# Patient Record
Sex: Male | Born: 1955 | Race: White | Hispanic: No | Marital: Married | Smoking: Former smoker
Health system: Southern US, Community
[De-identification: ages and names within clinical notes are randomized; demographics above are authoritative.]

## PROBLEM LIST (undated history)

## (undated) DIAGNOSIS — I4891 Unspecified atrial fibrillation: Secondary | ICD-10-CM

## (undated) DIAGNOSIS — I509 Heart failure, unspecified: Secondary | ICD-10-CM

## (undated) DIAGNOSIS — S88919A Complete traumatic amputation of unspecified lower leg, level unspecified, initial encounter: Secondary | ICD-10-CM

## (undated) DIAGNOSIS — N189 Chronic kidney disease, unspecified: Secondary | ICD-10-CM

## (undated) DIAGNOSIS — I10 Essential (primary) hypertension: Secondary | ICD-10-CM

## (undated) HISTORY — DX: Essential (primary) hypertension: I10

## (undated) HISTORY — DX: Chronic kidney disease, unspecified: N18.9

## (undated) HISTORY — DX: Heart failure, unspecified: I50.9

## (undated) HISTORY — DX: Unspecified atrial fibrillation: I48.91

---

## 2010-06-27 ENCOUNTER — Inpatient Hospital Stay (HOSPITAL_COMMUNITY)
Admission: EM | Admit: 2010-06-27 | Discharge: 2010-07-15 | Disposition: A | Discharge status: Rehabilitation Facility | Payer: Self-pay | Source: Home / Self Care | Admitting: Emergency Medicine

## 2010-06-28 ENCOUNTER — Ambulatory Visit: Payer: Self-pay | Admitting: Cardiovascular Disease

## 2010-06-28 ENCOUNTER — Encounter (INDEPENDENT_AMBULATORY_CARE_PROVIDER_SITE_OTHER): Payer: Self-pay | Admitting: Internal Medicine

## 2010-06-29 ENCOUNTER — Encounter (INDEPENDENT_AMBULATORY_CARE_PROVIDER_SITE_OTHER): Payer: Self-pay | Admitting: Internal Medicine

## 2010-06-29 HISTORY — PX: BELOW KNEE LEG AMPUTATION: SUR23

## 2010-07-06 ENCOUNTER — Ambulatory Visit: Payer: Self-pay | Admitting: Infectious Disease

## 2010-07-08 ENCOUNTER — Encounter: Payer: Self-pay | Admitting: Internal Medicine

## 2010-07-11 ENCOUNTER — Encounter: Payer: Self-pay | Admitting: Internal Medicine

## 2010-07-15 ENCOUNTER — Inpatient Hospital Stay (HOSPITAL_COMMUNITY)
Admission: RE | Admit: 2010-07-15 | Discharge: 2010-07-29 | Payer: Self-pay | Admitting: Physical Medicine & Rehabilitation

## 2010-07-16 ENCOUNTER — Ambulatory Visit: Payer: Self-pay | Admitting: Physical Medicine & Rehabilitation

## 2010-08-05 ENCOUNTER — Telehealth: Payer: Self-pay | Admitting: Internal Medicine

## 2010-08-09 DIAGNOSIS — I4891 Unspecified atrial fibrillation: Secondary | ICD-10-CM

## 2010-08-09 DIAGNOSIS — I10 Essential (primary) hypertension: Secondary | ICD-10-CM | POA: Insufficient documentation

## 2010-08-09 DIAGNOSIS — I480 Paroxysmal atrial fibrillation: Secondary | ICD-10-CM | POA: Insufficient documentation

## 2010-08-12 ENCOUNTER — Encounter: Payer: Self-pay | Admitting: Internal Medicine

## 2010-08-12 ENCOUNTER — Ambulatory Visit: Payer: Self-pay | Admitting: Internal Medicine

## 2010-08-12 DIAGNOSIS — I428 Other cardiomyopathies: Secondary | ICD-10-CM | POA: Insufficient documentation

## 2010-08-12 DIAGNOSIS — S88119A Complete traumatic amputation at level between knee and ankle, unspecified lower leg, initial encounter: Secondary | ICD-10-CM | POA: Insufficient documentation

## 2010-08-12 DIAGNOSIS — I5022 Chronic systolic (congestive) heart failure: Secondary | ICD-10-CM | POA: Insufficient documentation

## 2010-08-12 DIAGNOSIS — E119 Type 2 diabetes mellitus without complications: Secondary | ICD-10-CM | POA: Insufficient documentation

## 2010-08-12 DIAGNOSIS — N183 Chronic kidney disease, stage 3 (moderate): Secondary | ICD-10-CM

## 2010-08-12 DIAGNOSIS — N1831 Chronic kidney disease, stage 3a: Secondary | ICD-10-CM | POA: Insufficient documentation

## 2010-08-19 ENCOUNTER — Ambulatory Visit: Payer: Self-pay | Admitting: Internal Medicine

## 2010-09-01 ENCOUNTER — Ambulatory Visit (HOSPITAL_COMMUNITY)
Admission: RE | Admit: 2010-09-01 | Discharge: 2010-09-01 | Payer: Self-pay | Source: Home / Self Care | Attending: Orthopedic Surgery | Admitting: Orthopedic Surgery

## 2010-09-01 LAB — CBC
HCT: 43.6 % (ref 39.0–52.0)
Hemoglobin: 14.1 g/dL (ref 13.0–17.0)
MCH: 29.7 pg (ref 26.0–34.0)
MCHC: 32.3 g/dL (ref 30.0–36.0)
MCV: 91.8 fL (ref 78.0–100.0)
Platelets: 426 10*3/uL — ABNORMAL HIGH (ref 150–400)
RBC: 4.75 MIL/uL (ref 4.22–5.81)
RDW: 16 % — ABNORMAL HIGH (ref 11.5–15.5)
WBC: 8.8 10*3/uL (ref 4.0–10.5)

## 2010-09-01 LAB — COMPREHENSIVE METABOLIC PANEL
ALT: 16 U/L (ref 0–53)
AST: 22 U/L (ref 0–37)
Albumin: 3.4 g/dL — ABNORMAL LOW (ref 3.5–5.2)
Alkaline Phosphatase: 69 U/L (ref 39–117)
BUN: 48 mg/dL — ABNORMAL HIGH (ref 6–23)
CO2: 24 mEq/L (ref 19–32)
Calcium: 9.5 mg/dL (ref 8.4–10.5)
Chloride: 103 mEq/L (ref 96–112)
Creatinine, Ser: 2.14 mg/dL — ABNORMAL HIGH (ref 0.4–1.5)
GFR calc Af Amer: 39 mL/min — ABNORMAL LOW (ref >60)
GFR calc non Af Amer: 32 mL/min — ABNORMAL LOW (ref >60)
Glucose, Bld: 140 mg/dL — ABNORMAL HIGH (ref 70–99)
Potassium: 4.9 mEq/L (ref 3.5–5.1)
Sodium: 136 mEq/L (ref 135–145)
Total Bilirubin: 0.5 mg/dL (ref 0.3–1.2)
Total Protein: 8.3 g/dL (ref 6.0–8.3)

## 2010-09-01 LAB — PROTIME-INR
INR: 1.12 (ref 0.00–1.49)
Prothrombin Time: 14.6 seconds (ref 11.6–15.2)

## 2010-09-01 LAB — GLUCOSE, CAPILLARY: Glucose-Capillary: 128 mg/dL — ABNORMAL HIGH (ref 70–99)

## 2010-09-01 LAB — APTT: aPTT: 29 seconds (ref 24–37)

## 2010-09-04 LAB — CONVERTED CEMR LAB
ALT: 12 units/L (ref 0–53)
AST: 16 units/L (ref 0–37)
Albumin: 3.1 g/dL — ABNORMAL LOW (ref 3.5–5.2)
Alkaline Phosphatase: 78 units/L (ref 39–117)
BUN: 32 mg/dL — ABNORMAL HIGH (ref 6–23)
Bilirubin, Direct: 0.2 mg/dL (ref 0.0–0.3)
CO2: 29 meq/L (ref 19–32)
Calcium: 8.9 mg/dL (ref 8.4–10.5)
Chloride: 95 meq/L — ABNORMAL LOW (ref 96–112)
Creatinine, Ser: 2 mg/dL — ABNORMAL HIGH (ref 0.4–1.5)
GFR calc non Af Amer: 36.16 mL/min — ABNORMAL LOW (ref >60.00)
Glucose, Bld: 85 mg/dL (ref 70–99)
Potassium: 4.1 meq/L (ref 3.5–5.1)
Sodium: 132 meq/L — ABNORMAL LOW (ref 135–145)
TSH: 1.95 microintl units/mL (ref 0.35–5.50)
Total Bilirubin: 0.7 mg/dL (ref 0.3–1.2)
Total Protein: 7.6 g/dL (ref 6.0–8.3)

## 2010-09-09 ENCOUNTER — Telehealth (INDEPENDENT_AMBULATORY_CARE_PROVIDER_SITE_OTHER): Payer: Self-pay | Admitting: *Deleted

## 2010-09-12 ENCOUNTER — Encounter (HOSPITAL_COMMUNITY)
Admission: RE | Admit: 2010-09-12 | Discharge: 2010-09-27 | Payer: Self-pay | Source: Home / Self Care | Attending: Internal Medicine | Admitting: Internal Medicine

## 2010-09-12 ENCOUNTER — Ambulatory Visit: Admission: RE | Admit: 2010-09-12 | Discharge: 2010-09-12 | Payer: Self-pay | Source: Home / Self Care

## 2010-09-12 ENCOUNTER — Encounter: Payer: Self-pay | Admitting: Internal Medicine

## 2010-09-19 ENCOUNTER — Encounter: Payer: Self-pay | Admitting: Internal Medicine

## 2010-09-19 ENCOUNTER — Other Ambulatory Visit: Payer: Self-pay | Admitting: Internal Medicine

## 2010-09-19 ENCOUNTER — Ambulatory Visit
Admission: RE | Admit: 2010-09-19 | Discharge: 2010-09-19 | Payer: Self-pay | Source: Home / Self Care | Attending: Cardiology | Admitting: Cardiology

## 2010-09-19 ENCOUNTER — Ambulatory Visit
Admission: RE | Admit: 2010-09-19 | Discharge: 2010-09-19 | Payer: Self-pay | Source: Home / Self Care | Attending: Internal Medicine | Admitting: Internal Medicine

## 2010-09-19 LAB — BRAIN NATRIURETIC PEPTIDE: Pro B Natriuretic peptide (BNP): 203.1 pg/mL — ABNORMAL HIGH (ref 0.0–100.0)

## 2010-09-19 LAB — TSH: TSH: 1.37 u[IU]/mL (ref 0.35–5.50)

## 2010-09-19 LAB — BASIC METABOLIC PANEL
BUN: 34 mg/dL — ABNORMAL HIGH (ref 6–23)
CO2: 26 mEq/L (ref 19–32)
Calcium: 9.6 mg/dL (ref 8.4–10.5)
Chloride: 96 mEq/L (ref 96–112)
Creatinine, Ser: 1.7 mg/dL — ABNORMAL HIGH (ref 0.4–1.5)
GFR: 46.17 mL/min — ABNORMAL LOW (ref >60.00)
Glucose, Bld: 119 mg/dL — ABNORMAL HIGH (ref 70–99)
Potassium: 4 mEq/L (ref 3.5–5.1)
Sodium: 133 mEq/L — ABNORMAL LOW (ref 135–145)

## 2010-09-19 LAB — CBC WITH DIFFERENTIAL/PLATELET
Basophils Absolute: 0.1 10*3/uL (ref 0.0–0.1)
Basophils Relative: 0.5 % (ref 0.0–3.0)
Eosinophils Absolute: 1 10*3/uL — ABNORMAL HIGH (ref 0.0–0.7)
Eosinophils Relative: 8 % — ABNORMAL HIGH (ref 0.0–5.0)
HCT: 43 % (ref 39.0–52.0)
Hemoglobin: 14.1 g/dL (ref 13.0–17.0)
Lymphocytes Relative: 17.1 % (ref 12.0–46.0)
Lymphs Abs: 2.1 10*3/uL (ref 0.7–4.0)
MCHC: 32.9 g/dL (ref 30.0–36.0)
MCV: 92.1 fl (ref 78.0–100.0)
Monocytes Absolute: 0.9 10*3/uL (ref 0.1–1.0)
Monocytes Relative: 7.9 % (ref 3.0–12.0)
Neutro Abs: 8 10*3/uL — ABNORMAL HIGH (ref 1.4–7.7)
Neutrophils Relative %: 66.5 % (ref 43.0–77.0)
Platelets: 335 10*3/uL (ref 150.0–400.0)
RBC: 4.67 Mil/uL (ref 4.22–5.81)
RDW: 17.5 % — ABNORMAL HIGH (ref 11.5–14.6)
WBC: 12 10*3/uL — ABNORMAL HIGH (ref 4.5–10.5)

## 2010-09-19 LAB — CONVERTED CEMR LAB: POC INR: 1.1

## 2010-09-19 LAB — HEPATIC FUNCTION PANEL
ALT: 16 U/L (ref 0–53)
AST: 22 U/L (ref 0–37)
Albumin: 3.6 g/dL (ref 3.5–5.2)
Alkaline Phosphatase: 62 U/L (ref 39–117)
Bilirubin, Direct: 0.1 mg/dL (ref 0.0–0.3)
Total Bilirubin: 0.6 mg/dL (ref 0.3–1.2)
Total Protein: 8 g/dL (ref 6.0–8.3)

## 2010-09-19 LAB — T4, FREE: Free T4: 1.48 ng/dL (ref 0.60–1.60)

## 2010-09-22 LAB — SURGICAL PCR SCREEN
MRSA, PCR: NEGATIVE
Staphylococcus aureus: POSITIVE — AB

## 2010-09-26 ENCOUNTER — Ambulatory Visit: Admission: RE | Admit: 2010-09-26 | Discharge: 2010-09-26 | Payer: Self-pay | Source: Home / Self Care

## 2010-09-26 LAB — CONVERTED CEMR LAB: POC INR: 1.2

## 2010-09-27 NOTE — Progress Notes (Signed)
Summary: talk to nurse  Phone Note Call from Patient Call back at Home Phone [phone] Call back at [phone]   Caller: Patient Reason for Call: Talk to Nurse, Referral Summary of Call: pt called wanting to schedule an appt to see Dr Gala Romney. Per his d/c summary states to f/u with Bensim[facility] in 2 weeks. I dont see anything that states for him to f/u with Bensim[facility] only the coumadin clinic.  Initial call taken by: Edman Circle,  August 05, 2010 1:06 PM  Follow-up for Phone Call        Brian Martin calls today to schedule post hospital follow-up appt.  Dr. Gala Romney is aware and appt. scheduled for 08/12/10 at 9:15am.  Pt confirmed appt. Mylo Red RN

## 2010-09-29 NOTE — Progress Notes (Signed)
Summary: Nuclear Pre-Procedure  Phone Note Outgoing Call Call back at [facility] Phone [phone]   Call placed by: Stanton Kidney, EMT-P,  September 09, 2010 1:08 PM Call placed to: Patient Action Taken: Phone Call Completed Summary of Call: Reviewed information on Myoview Information Sheet (see scanned document for further details).  Spoke with the patient. Stanton Kidney, EMT-P  September 09, 2010 1:08 PM     Nuclear Med Background Indications for Stress Test: Evaluation for Ischemia, Post Hospital  Indications Comments: 07/28/10 (L) BKA > A. Fib  History: Echo  History Comments: 11/11 Echo: EF= 25-30%  Symptoms: SOB    Nuclear Pre-Procedure Cardiac Risk Factors: History of Smoking, Hypertension, IDDM Type 2 Height (in): 73

## 2010-09-29 NOTE — Op Note (Signed)
  NAMEREEF, ACHTERBERG               ACCOUNT NO.:  [id number]  MEDICAL RECORD NO.:  [id number]          PATIENT TYPE:  AMB  LOCATION:  SDS                          FACILITY:  [facility]  PHYSICIAN:  Nadara Mustard, MD     DATE OF BIRTH:  07/03/1956  DATE OF PROCEDURE:  09/01/2010 DATE OF DISCHARGE:  09/01/2010                              OPERATIVE REPORT   PREOPERATIVE DIAGNOSIS:  Gangrene, left transtibial amputation.  POSTOPERATIVE DIAGNOSIS:  Gangrene, left transtibial amputation.  PROCEDURE:  Revision left transtibial amputation.  SURGEON:  Nadara Mustard, MD  ANESTHESIA:  General.  ESTIMATED BLOOD LOSS:  Minimal.  ANTIBIOTICS:  1 gram of Kefzol.  DRAINS:  None.  COMPLICATIONS:  None.  TOURNIQUET TIME:  None.  DISPOSITION:  To PACU in stable condition.  INDICATIONS FOR PROCEDURE:  The patient is a 55 year old gentleman, diabetic, insensate neuropathy with peripheral vascular disease status post a left transtibial amputation who presents at this time for revision amputation due to foul-smelling gangrenous changes of the transtibial amputation.  Risks and benefits were discussed including persistent infection, neurovascular injury, nonhealing of the wound, need for higher-level amputation, risk of DVT, and risk of pulmonary embolus.  The patient states he understands and wished to proceed at this time.  DESCRIPTION OF PROCEDURE:  The patient was brought to OR room 1 and underwent a general anesthetic.  After adequate level of anesthesia was obtained, the patient's left lower extremity was prepped using DuraPrep, draped into a sterile field, and the necrotic ulcers were draped out of the sterile field with an impervious Ioban drape.  A V-type incision was made around the necrotic tissue.  This was carried down to bone.  The bone was resected 4 cm proximal to the previous bone cuts of both the tibia and fibula.  This left enough bone length for a  below-the-knee prosthesis.  The vascular bundles were suture ligated x2 each. Hemostasis was obtained.  The tissue was contractile but did not have a perfect color or consistency.  There was no necrosis, no abscess, but there was some mild ischemic changes to the soft tissue, however, it did have good contractility.  The skin was then closed using a far-near-near- far suture technique with 2-0 nylon.  This closed well without tension on the skin.  The wound was covered with Adaptic orthopedic sponges, ABD dressing, Kerlix, and Coban.  The patient was extubated and taken to PACU in stable condition. Plan for discharge to home.  Start dressing changes in 5 days with Ace compressive wrap.  Plan to follow up in the office in 4 weeks.     Nadara Mustard, MD     MVD/MEDQ  D:  09/01/2010  T:  09/02/2010  Job:  [id number]  Electronically Signed by Aldean Baker MD on 09/29/2010 07:32:38 AM

## 2010-09-29 NOTE — Consult Note (Signed)
Summary: [facility]  [facility]   Imported By: Marylou Mccoy 08/18/2010 16:44:44  _____________________________________________________________________  External Attachment:    Type:   Image     Comment:   External Document

## 2010-09-29 NOTE — Assessment & Plan Note (Signed)
Summary: appt 915a/a-fib/mt   CC:  no complaints.  History of Present Illness: Primary Cardiologist:  Dr. Arvilla Meres  Park Beck is a 55 year old male admitted in early November with a left foot ulcer complicated by osteomyelitis.  He subsequently underwent left below-the-knee amputation.  Cardiology was involved when it was noted that the patient had atrial fibrillation.  He was also noted to be in acute systolic heart failure.  He has a dilated cardiomyopathy with an EF of 25-30% and moderate mitral regurgitation on echocardiogram.  Transesophageal echocardiogram during his hospitalization demonstrated no vegetation with an EF of 35%.  He developed acute renal failure secondary to acute tubular necrosis in the setting of sepsis.  He had a prolonged hospitalization culminating in rehabilitation.  He was discharged from rehabilitation December 2.  He presents for followup.  He is doing well.  He is staying at his son's house; his son is with him today.  He denies chest pain.  He denies significant changes in dyspnea.  He is working with rehab without significant problems.  No syncope.  No orthopnea or PND.  His unna boot is off the right leg.  His ulcer is healing.  He has continued edema in this leg.  No fevers or chills.  He is off antibiotics.  Current Medications (verified): 1)  Amiodarone Hcl 200 Mg Tabs (Amiodarone Hcl) .... Take One Tablet By Mouth Twice A Day 2)  Aspirin 81 Mg Tbec (Aspirin) .... Take One Tablet By Mouth Daily 3)  Carvedilol 25 Mg Tabs (Carvedilol) .... Take One Tablet By Mouth Twice A Day 4)  Hydralazine Hcl 50 Mg Tabs (Hydralazine Hcl) .... Take One Tablet By Mouth Three Times A Day 5)  Lantus 100 Unit/ml Soln (Insulin Glargine) .Marland Kitchen.. 10 Units At Bedtime 6)  Warfarin Sodium 2 Mg Tabs (Warfarin Sodium) .... As Directed 7)  Isosorbide Mononitrate Cr 30 Mg Xr24h-Tab (Isosorbide Mononitrate) .... Take One Tablet By Mouth Daily 8)  Fentanyl 25 Mcg/hr Pt72 (Fentanyl)  .... Every 3 Days 9)  Hydrocodone-Acetaminophen 5-500 Mg Tabs (Hydrocodone-Acetaminophen) .Marland Kitchen.. 1-2 Every 4 Hours As Needed 10)  Methocarbamol 500 Mg Tabs (Methocarbamol) .... One Every 6 Hours As Needed  Past History:  Past Medical History: Persistent Atrial Fibrillation   a.  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  Chronic renal failure.   Past Surgical History: s/p left BKA  Review of Systems       As per HPI and past medical history; otherwise all systems negative.   Vital Signs:  Patient profile:   55 year old male Height:      73 inches Weight:      250 pounds BMI:     33.10 Pulse rate:   64 / minute Resp:     14 per minute BP sitting:   160 / 80  (left arm)  Vitals Entered By: Kem Parkinson (August 12, 2010 9:36 AM)  Physical Exam  General:  Chronically ill appearing, well developed, in no acute distress HEENT: normal Neck: JVP 9  Cardiac:  normal S1, S2; RRR; no murmur Lungs:  clear to auscultation bilaterally, no wheezing, rhonchi or rales Abd: soft, nontender, no hepatomegaly Ext: 2-3+ right leg edema to thigh Skin: warm and dry; healing ulcer lat right leg with eschar Neuro:  CNs 2-12 intact, no focal abnormalities noted    Impression & Recommendations:  Problem # 1:  ATRIAL FIBRILLATION (ICD-427.31)  Maintaining NSR on Amio. Remains on coumadin due to stroke risk. Decrease Amio to 200 mg once daily. Check LFTs and TSH.  Problem # 2:  CARDIOMYOPATHY, DILATED (ICD-425.4) Good medical regimen. No ACE due to CKD. Suspect tachy mediated CM.   Will arrange myoview to assess for ischemia and reassess EF.  Increase hydralazine to 75 three times a day and imdue 30 two times a day  Problem # 3:  CHRONIC SYSTOLIC HEART FAILURE (ICD-428.22) As above. Increased edema in leg.  Add lasix 40 mg once daily. Check bmet in one week.  Problem # 4:   CHRONIC KIDNEY DISEASE STAGE III (MODERATE) (ICD-585.3) No ACE-I for now due to renal insuff. Titrate hydralazine and Imdur.  Problem # 5:  HYPERTENSION, UNSPECIFIED (ICD-401.9) BP up. Titrating meds.  Other Orders: Nuclear Stress Test (Nuc Stress Test)  Patient Instructions: 1)  Your physician recommends that you schedule a follow-up appointment in: 1 month 2)  Your physician recommends that you return for lab work next Friday--BMET,TSH,liver function test--428.22: 3)  Your physician has recommended you make the following change in your medication: Decrease amiodarone to 200 mg by mouth daily. Increase hydralazine to 75 mg by mouth three times a day.Increase isosorbide mononitrate to 60 mg by mouth daily. Start furosemide 40 mg by mouth daily. 4)  Your physician has requested that you have an adenosine myoview.  For further information please visit [email or web address]  Please follow instruction sheet, as given. Prescriptions: ISOSORBIDE MONONITRATE CR 60 MG XR24H-TAB (ISOSORBIDE MONONITRATE) Take one tablet by mouth daily  #30 x 3   Entered by:   Dossie Arbour, RN, BSN   Authorized by:   Dolores Patty, MD, [facility]   Signed by:   Dossie Arbour, RN, BSN on 08/12/2010   Method used:   Electronically to        [facility] [address] (retail)       [phone]       [place], [place]  [address]       Ph: [phone]       Fax: [phone]   RxID:   [id number] FUROSEMIDE 40 MG TABS (FUROSEMIDE) Take one tablet by mouth daily.  #30 x 3   Entered by:   Dossie Arbour, RN, BSN   Authorized by:   Dolores Patty, MD, [facility]   Signed by:   Dossie Arbour, RN, BSN on 08/12/2010   Method used:   Electronically to        [facility] [address] (retail)       [phone]       [place], [place]  [address]       Ph: [phone]       Fax: [phone]   RxID:   [id number] HYDRALAZINE HCL 25 MG  TABS (HYDRALAZINE HCL) Take 3  tablets by mouth three times a day  #270 x 3   Entered by:   Dossie Arbour, RN, BSN   Authorized by:   Dolores Patty, MD, [facility]   Signed by:   Dossie Arbour, RN, BSN on 08/12/2010   Method used:   Electronically to        [facility] [address] (retail)       [phone]       [place], [place]  [address]  Ph: [phone]       Fax: [phone]   RxID:   [id number] AMIODARONE HCL 200 MG TABS (AMIODARONE HCL) Take one tablet by mouth daily  #30 x 6   Entered by:   Dossie Arbour, RN, BSN   Authorized by:   Dolores Patty, MD, [facility]   Signed by:   Dossie Arbour, RN, BSN on 08/12/2010   Method used:   Electronically to        [facility] [address] (retail)       [phone]       [place], [place]  [address]       Ph: [phone]       Fax: [phone]   RxID:   [id number]

## 2010-09-29 NOTE — Medication Information (Signed)
Summary: new/sp  Anticoagulant Therapy  Managed by: Weston Brass, PharmD Referring MD: D.Bensimhon Supervising MD: Daleen Squibb MD, Maisie Fus Indication 1: Atrial Fibrillation Lab Used: [facility] [place] Site: [address] INR POC 1.1 INR RANGE 2.0-3.0  Dietary changes: no    Health status changes: no    Bleeding/hemorrhagic complications: no    Recent/future hospitalizations: yes       Details: Pt was started on Coumadin during hospitalization in December.  Per pt and his son, HH was checking INRs for a few weeks after discharge but I am not sure who was monitoring Coumadin.  They state pt has not had Coumadin checked since that time and no one ever told them to change the dose  Any changes in medication regimen? no    Recent/future dental: no  Any missed doses?: no       Is patient compliant with meds? yes      Comments: Pt and caregiver educated on bleeding risks, dietary concerns and medication interactions.  States he has been taking 1/2 of 3mg  tablet since d/c from hospital.   Anticoagulation Management History:      The patient comes in today for his initial visit for anticoagulation therapy.  Positive risk factors for bleeding include presence of serious comorbidities.  Negative risk factors for bleeding include an age less than 64 years old.  The bleeding index is 'intermediate risk'.  Positive CHADS2 values include History of CHF, History of HTN, and History of Diabetes.  Negative CHADS2 values include Age > 31 years old.  Anticoagulation responsible provider: Daleen Squibb MD, Maisie Fus.  INR POC: 1.1.    Anticoagulation Management Assessment/Plan:      The patient's current anticoagulation dose is Warfarin sodium 2 mg tabs: as directed.  The target INR is 2.0-3.0.  The next INR is due 09/26/2010.  Anticoagulation instructions were given to patient.  Results were reviewed/authorized by Weston Brass, PharmD.  He was notified by Weston Brass PharmD.         Current Anticoagulation  Instructions: INR 1.1  Increase dose to 3mg  daily.  Recheck INR in 1 week.

## 2010-09-29 NOTE — Assessment & Plan Note (Addendum)
Summary: Cardiology Nuclear Testing  Nuclear Med Background Indications for Stress Test: Evaluation for Ischemia, Post Hospital  Indications Comments: 07/28/10 (L) BKA > A. Fib  History: Echo  History Comments: 11/11 Echo: EF= 25-30%  Symptoms: Palpitations, SOB    Nuclear Pre-Procedure Cardiac Risk Factors: History of Smoking, Hypertension, IDDM Type 2 Caffeine/Decaff Intake: none NPO After: 6:00 PM Lungs: clear IV 0.9% NS with Angio Cath: 22g     IV Site: R Wrist IV Started by: Doyne Keel, CNMT Chest Size (in) 44     Height (in): 73 Weight (lb): 184 BMI: 24.36 Tech Comments: Carvedilol held x 14hrs.  Nuclear Med Study 1 or 2 day study:  1 day     Stress Test Type:  Eugenie Birks Reading MD:  Arvilla Meres, MD     Referring MD:  D.Bensimhon Resting Radionuclide:  Technetium 8m Tetrofosmin     Resting Radionuclide Dose:  10.5 mCi  Stress Radionuclide:  Technetium 74m Tetrofosmin     Stress Radionuclide Dose:  33 mCi   Stress Protocol  Max Systolic BP: 151 mm Hg Lexiscan: 0.4 mg   Stress Test Technologist:  Milana Na, EMT-P     Nuclear Technologist:  Doyne Keel, CNMT  Rest Procedure  Myocardial perfusion imaging was performed at rest 45 minutes following the intravenous administration of Technetium 71m Tetrofosmin.  Stress Procedure  The patient received IV Lexiscan 0.4 mg over 15-seconds.  Technetium 51m Tetrofosmin injected at 30-seconds.  There were no significant changes with infusion.  Quantitative spect images were obtained after a 45 minute delay.  QPS Raw Data Images:  Normal; no motion artifact; normal heart/lung ratio. Stress Images:  Mildly decreased uptake in the inferior wall and infero-apex. Rest Images:  Mildly decreased uptake in the inferior wall and infero-apex. Subtraction (SDS):  Mildly decreased uptake in the inferior wall and infero-apex. Suspect soft tissue attenuation but cannot exclude previous infarct. No ischemia. Transient Ischemic  Dilatation:  0.94  (Normal <1.22)  Lung/Heart Ratio:  0.29  (Normal <0.45)  Quantitative Gated Spect Images QGS EDV:  144 ml QGS ESV:  81 ml QGS EF:  43 % QGS cine images:  Septal dyskinesis  Findings Low risk nuclear study      Overall Impression  Exercise Capacity: Lexiscan with no exercise. ECG Impression: No significant ST segment change with Lexiscan. Overall Impression: Low risk stress nuclear study. Overall Impression Comments: Mildly decreased uptake in the inferior wall and infero-apex. Suspect soft tissue attenuation but cannot exclude previous infarct. No ischemia. EF 43%  Appended Document: Cardiology Nuclear Testing pt aware at [facility]

## 2010-10-03 ENCOUNTER — Encounter: Payer: Self-pay | Admitting: Cardiology

## 2010-10-03 ENCOUNTER — Encounter (INDEPENDENT_AMBULATORY_CARE_PROVIDER_SITE_OTHER): Payer: Medicare Other

## 2010-10-03 DIAGNOSIS — I4891 Unspecified atrial fibrillation: Secondary | ICD-10-CM

## 2010-10-03 DIAGNOSIS — Z7901 Long term (current) use of anticoagulants: Secondary | ICD-10-CM

## 2010-10-03 LAB — CONVERTED CEMR LAB: POC INR: 2

## 2010-10-05 NOTE — Medication Information (Signed)
Summary: rov/sp  Anticoagulant Therapy  Managed by: Bethena Midget, RN, BSN Referring MD: D.Bensimhon Supervising MD: Patty Sermons Indication 1: Atrial Fibrillation Lab Used: [facility] [place] Site: [address] INR POC 1.2 INR RANGE 2.0-3.0  Dietary changes: no    Health status changes: no    Bleeding/hemorrhagic complications: no    Recent/future hospitalizations: no    Any changes in medication regimen? no    Recent/future dental: no  Any missed doses?: no       Is patient compliant with meds? yes      Comments: Pt has 2mg s tablet prior to last visit he was taking 1/2 pill at last visit dose increased to 1 pill everyday thus 2mg s daily.   Anticoagulation Management History:      The patient is taking warfarin and comes in today for a routine follow up visit.  Positive risk factors for bleeding include presence of serious comorbidities.  Negative risk factors for bleeding include an age less than 56 years old.  The bleeding index is 'intermediate risk'.  Positive CHADS2 values include History of CHF, History of HTN, and History of Diabetes.  Negative CHADS2 values include Age > 37 years old.  Anticoagulation responsible provider: Kaian Fahs.  INR POC: 1.2.  Cuvette Lot#: [id number].  Exp: 08/2011.    Anticoagulation Management Assessment/Plan:      The patient's current anticoagulation dose is Warfarin sodium 2 mg tabs: as directed.  The target INR is 2.0-3.0.  The next INR is due 10/03/2010.  Anticoagulation instructions were given to patient.  Results were reviewed/authorized by Bethena Midget, RN, BSN.  He was notified by Bethena Midget, RN, BSN.         Prior Anticoagulation Instructions: INR 1.1  Increase dose to 3mg  daily.  Recheck INR in 1 week.   Current Anticoagulation Instructions: INR 1.2 Change dose to 1.5 pills everyday. Recheck in one week.  Sample given 2mg  # 10, Lot # [id number] Exp 12/2010

## 2010-10-05 NOTE — Assessment & Plan Note (Signed)
Summary: 1 MONTH ROV/SL   Visit Type:  Follow-up  CC:  no cardiac complaints.  History of Present Illness: Primary Cardiologist:  Dr. Arvilla Meres  Brian Martin is a 55 year old male admitted in early November with a left foot ulcer complicated by osteomyelitis.  He subsequently underwent left below-the-knee amputation.  Cardiology was involved when it was noted that the patient had atrial fibrillation.  He was also noted to be in acute systolic heart failure.  He has a dilated cardiomyopathy with an EF of 25-30% and moderate mitral regurgitation on echocardiogram.  Transesophageal echocardiogram during his hospitalization demonstrated no vegetation with an EF of 35%.  He developed acute renal failure secondary to acute tubular necrosis in the setting of sepsis with a Cr of 9.0  He had a prolonged hospitalization culminating in rehabilitation.  He was discharged from rehabilitation December 2.  He presents for followup.  He is doing well.  He is staying at his son's house; his son is with him today.  He denies chest pain.  He denies significant changes in dyspnea.  He is working with rehab without significant problems.  No syncope.  No orthopnea or PND. No CP/dyspnea.   Had nuke study which showed EF 43%.  Mildly decreased uptake in the inferior wall and infero-apex. Suspect soft tissue attenuation but cannot exclude previous infarct. No ischemia. Last Cr 2.0  He is taking medications as prescribed excepthe ran out of carvedilol earlier this week. No bleeding with coumadin but has not had it followed up in at least a month.   Problems Prior to Update: 1)  Long-term (CURRENT) Use of Other Medications  (ICD-V58.69) 2)  Bka, Left Leg  (ICD-V49.75) 3)  Diabetes Mellitus, Type II  (ICD-250.00) 4)  Chronic Kidney Disease Stage Iii (MODERATE)  (ICD-585.3) 5)  Chronic Systolic Heart Failure  (ICD-428.22) 6)  Cardiomyopathy, Dilated  (ICD-425.4) 7)  Hypertension, Unspecified  (ICD-401.9) 8)  Atrial  Fibrillation  (ICD-427.31)  Medications Prior to Update: 1)  Amiodarone Hcl 200 Mg Tabs (Amiodarone Hcl) .... Take One Tablet By Mouth Daily 2)  Aspirin 81 Mg Tbec (Aspirin) .... Take One Tablet By Mouth Daily 3)  Carvedilol 25 Mg Tabs (Carvedilol) .... Take One Tablet By Mouth Twice A Day 4)  Hydralazine Hcl 25 Mg Tabs (Hydralazine Hcl) .... Take 3  Tablets By Mouth Three Times A Day 5)  Lantus 100 Unit/ml Soln (Insulin Glargine) .Marland Kitchen.. 10 Units At Bedtime 6)  Warfarin Sodium 2 Mg Tabs (Warfarin Sodium) .... As Directed 7)  Isosorbide Mononitrate Cr 60 Mg Xr24h-Tab (Isosorbide Mononitrate) .... Take One Tablet By Mouth Daily 8)  Fentanyl 25 Mcg/hr Pt72 (Fentanyl) .... Every 3 Days 9)  Hydrocodone-Acetaminophen 5-500 Mg Tabs (Hydrocodone-Acetaminophen) .Marland Kitchen.. 1-2 Every 4 Hours As Needed 10)  Methocarbamol 500 Mg Tabs (Methocarbamol) .... One Every 6 Hours As Needed 11)  Furosemide 40 Mg Tabs (Furosemide) .... Take One Tablet By Mouth Daily.  Current Medications (verified): 1)  Amiodarone Hcl 200 Mg Tabs (Amiodarone Hcl) .... Take One Tablet By Mouth Daily 2)  Aspirin 81 Mg Tbec (Aspirin) .... Take One Tablet By Mouth Daily 3)  Carvedilol 25 Mg Tabs (Carvedilol) .... Take One Tablet By Mouth Twice A Day 4)  Hydralazine Hcl 25 Mg Tabs (Hydralazine Hcl) .... Take 3  Tablets By Mouth Three Times A Day 5)  Lantus 100 Unit/ml Soln (Insulin Glargine) .Marland Kitchen.. 10 Units At Bedtime 6)  Warfarin Sodium 2 Mg Tabs (Warfarin Sodium) .... As Directed 7)  Isosorbide Mononitrate Cr  60 Mg Xr24h-Tab (Isosorbide Mononitrate) .... Take One Tablet By Mouth Daily 8)  Hydrocodone-Acetaminophen 5-500 Mg Tabs (Hydrocodone-Acetaminophen) .Marland Kitchen.. 1-2 Every 4 Hours As Needed 9)  Methocarbamol 500 Mg Tabs (Methocarbamol) .... One Every 6 Hours As Needed 10)  Furosemide 40 Mg Tabs (Furosemide) .... Take One Tablet By Mouth Daily.  Allergies (verified): No Known Drug Allergies  Past History:  Past Medical History: Atrial  Fibrillation   a.  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  Chronic renal failure.   Review of Systems       As per HPI and past medical history; otherwise all systems negative.   Vital Signs:  Patient profile:   55 year old male Height:      73 inches Weight:      194 pounds Pulse rate:   68 / minute Pulse rhythm:   regular BP sitting:   138 / 94  (right arm)  Vitals Entered By: Jacquelin Hawking, CMA (September 19, 2010 9:50 AM)  Physical Exam  General:  Chronically ill appearing, well developed, in no acute distress HEENT: normal Neck: no JVD Cardiac:  regular. normal S1, S2; RRR; no murmur Lungs:  clear to auscultation bilaterally, no wheezing, rhonchi or rales Abd: soft, nontender, no hepatomegaly Ext: 2-3+ right leg edema to thigh Skin: warm and dry; healing ulcer lat right leg with eschar Neuro:  CNs 2-12 intact, no focal abnormalities noted    Impression & Recommendations:  Problem # 1:  CHRONIC SYSTOLIC HEART FAILURE (ICD-428.22) LV function improving. No significant ischemia on Myoview (suspect probable NICM despite his CAD risk factors). No ACE-I for now due to renal insuff. Continue carvedilol and hydralazine. May be able to wean lasix eventually. Repeat echo in 2 months. Check labs today.  Problem # 2:  ATRIAL FIBRILLATION (ICD-427.31) Back in SR. Will continue amiodarone for now. If maintaining SR in 2 months will consider d/c amio. Continue coumadin.   Other Orders: EKG w/ Interpretation (93000) EKG w/ Interpretation (93000) TLB-BMP (Basic Metabolic Panel-BMET) (80048-METABOL) TLB-CBC Platelet - w/Differential (85025-CBCD) TLB-Hepatic/Liver Function Pnl (80076-HEPATIC) TLB-BNP (B-Natriuretic Peptide) (83880-BNPR) TLB-TSH (Thyroid Stimulating Hormone) (84443-TSH) TLB-T4 (Thyrox), Free [phone]) Echocardiogram (Echo)  Patient Instructions: 1)   Your physician recommends that you schedule a follow-up appointment in: 2 months with Dr Gala Romney 2)  Your physician recommends that you have lab work today 3)  Your physician recommends that you continue on your current medications as directed. Please refer to the Current Medication list given to you today. 4)  Your physician has requested that you have an echocardiogram in 2 months with your follow up visit.  Echocardiography is a painless test that uses sound waves to create images of your heart. It provides your doctor with information about the size and shape of your heart and how well your heart's chambers and valves are working.  This procedure takes approximately one hour. There are no restrictions for this procedure. Prescriptions: LANTUS 100 UNIT/ML SOLN (INSULIN GLARGINE) 10 units at bedtime  #1 x 0   Entered by:   Charolotte Capuchin, RN   Authorized by:   Dolores Patty, MD, [facility]   Signed by:   Charolotte Capuchin, RN on 09/19/2010   Method used:   Electronically to        [facility] [address] (retail)       [phone]       [facility]  [place], [place]  [address]       Ph: [phone]       Fax: [phone]   RxID:   [id number] CARVEDILOL 25 MG TABS (CARVEDILOL) Take one tablet by mouth twice a day  #60 x 11   Entered by:   Charolotte Capuchin, RN   Authorized by:   Dolores Patty, MD, [facility]   Signed by:   Charolotte Capuchin, RN on 09/19/2010   Method used:   Electronically to        [facility] [address] (retail)       [phone]       [place], [place]  [address]       Ph: [phone]       Fax: [phone]   RxID:   [id number]

## 2010-10-13 NOTE — Medication Information (Signed)
Summary: Coumadin Clinic  Anticoagulant Therapy  Managed by: Windell Hummingbird, RN Referring MD: D.Bensimhon Supervising MD: Myrtis Ser MD, Tinnie Gens Indication 1: Atrial Fibrillation Lab Used: [facility] [place] Site: [address] INR POC 2.0 INR RANGE 2.0-3.0  Dietary changes: no    Health status changes: no    Bleeding/hemorrhagic complications: no    Recent/future hospitalizations: no    Any changes in medication regimen? no    Recent/future dental: no  Any missed doses?: no       Is patient compliant with meds? yes       Allergies: No Known Drug Allergies  Anticoagulation Management History:      Positive risk factors for bleeding include presence of serious comorbidities.  Negative risk factors for bleeding include an age less than 29 years old.  The bleeding index is 'intermediate risk'.  Positive CHADS2 values include History of CHF, History of HTN, and History of Diabetes.  Negative CHADS2 values include Age > 29 years old.  Anticoagulation responsible provider: Myrtis Ser MD, Tinnie Gens.  INR POC: 2.0.  Cuvette Lot#: [id number].  Exp: 09/2011.    Anticoagulation Management Assessment/Plan:      The patient's current anticoagulation dose is Warfarin sodium 2 mg tabs: as directed.  The target INR is 2.0-3.0.  The next INR is due 10/17/2010.  Anticoagulation instructions were given to patient.  Results were reviewed/authorized by Windell Hummingbird, RN.  He was notified by Windell Hummingbird, RN.         Prior Anticoagulation Instructions: INR 1.2 Change dose to 1.5 pills everyday. Recheck in one week.  Sample given 2mg  # 10, Lot # [id number] Exp 12/2010   Current Anticoagulation Instructions: INR 2.0 Continue taking 1.5 tablets every day. Recheck 2 weeks.

## 2010-10-17 ENCOUNTER — Encounter: Payer: Self-pay | Admitting: Internal Medicine

## 2010-10-17 ENCOUNTER — Encounter (INDEPENDENT_AMBULATORY_CARE_PROVIDER_SITE_OTHER): Payer: Medicare Other

## 2010-10-17 DIAGNOSIS — I4891 Unspecified atrial fibrillation: Secondary | ICD-10-CM

## 2010-10-17 DIAGNOSIS — Z7901 Long term (current) use of anticoagulants: Secondary | ICD-10-CM

## 2010-10-17 LAB — CONVERTED CEMR LAB: POC INR: 1.9

## 2010-10-25 NOTE — Medication Information (Signed)
Summary: rov/ewj  Anticoagulant Therapy  Managed by: Weston Brass, PharmD Referring MD: D.Bensimhon Supervising MD: Clifton James MD, Cristal Deer Indication 1: Atrial Fibrillation Lab Used: [facility] [place] Site: [address] INR POC 1.9 INR RANGE 2.0-3.0  Dietary changes: no    Health status changes: no    Bleeding/hemorrhagic complications: no    Recent/future hospitalizations: no    Any changes in medication regimen? no    Recent/future dental: no  Any missed doses?: no       Is patient compliant with meds? yes      Comments: Patient reports being on 3mg  sample tablets instead of 2 mg tablets.  He was still taking the same overal dose of 3mg  daily as directed.  He was given more 3mg  sample tablets today(#16 exp [id number])  Allergies: No Known Drug Allergies  Anticoagulation Management History:      The patient is taking warfarin and comes in today for a routine follow up visit.  Positive risk factors for bleeding include presence of serious comorbidities.  Negative risk factors for bleeding include an age less than 67 years old.  The bleeding index is 'intermediate risk'.  Positive CHADS2 values include History of CHF, History of HTN, and History of Diabetes.  Negative CHADS2 values include Age > 2 years old.  Anticoagulation responsible provider: Clifton James MD, Cristal Deer.  INR POC: 1.9.  Cuvette Lot#: [id number].  Exp: 09/2011.    Anticoagulation Management Assessment/Plan:      The patient's current anticoagulation dose is Warfarin sodium 2 mg tabs: as directed.  The target INR is 2.0-3.0.  The next INR is due 10/31/2010.  Anticoagulation instructions were given to patient.  Results were reviewed/authorized by Weston Brass, PharmD.  He was notified by Margot Chimes PharmD Candidate.         Prior Anticoagulation Instructions: INR 2.0 Continue taking 1.5 tablets every day. Recheck 2 weeks.  Current Anticoagulation Instructions: INR 1.9  Take 1  1/2 tablets today and cut back on the greens a little.  Then resume your normal dose of 1 tablet (3mg ) everyday.  Recheck INR in 2 weeks.

## 2010-10-28 ENCOUNTER — Encounter: Payer: Self-pay | Admitting: Internal Medicine

## 2010-10-28 DIAGNOSIS — I4891 Unspecified atrial fibrillation: Secondary | ICD-10-CM

## 2010-10-31 ENCOUNTER — Encounter (INDEPENDENT_AMBULATORY_CARE_PROVIDER_SITE_OTHER): Payer: Medicaid Other

## 2010-10-31 ENCOUNTER — Encounter: Payer: Self-pay | Admitting: Internal Medicine

## 2010-10-31 DIAGNOSIS — Z7901 Long term (current) use of anticoagulants: Secondary | ICD-10-CM

## 2010-10-31 DIAGNOSIS — I4891 Unspecified atrial fibrillation: Secondary | ICD-10-CM

## 2010-11-04 ENCOUNTER — Other Ambulatory Visit (HOSPITAL_COMMUNITY): Payer: Self-pay

## 2010-11-04 ENCOUNTER — Ambulatory Visit: Payer: Medicare Other | Admitting: Internal Medicine

## 2010-11-04 ENCOUNTER — Ambulatory Visit: Payer: Self-pay | Admitting: Internal Medicine

## 2010-11-04 ENCOUNTER — Other Ambulatory Visit (HOSPITAL_COMMUNITY): Payer: Medicare Other

## 2010-11-08 LAB — RENAL FUNCTION PANEL
Albumin: 1.7 g/dL — ABNORMAL LOW (ref 3.5–5.2)
Albumin: 1.8 g/dL — ABNORMAL LOW (ref 3.5–5.2)
Albumin: 1.8 g/dL — ABNORMAL LOW (ref 3.5–5.2)
Albumin: 2.3 g/dL — ABNORMAL LOW (ref 3.5–5.2)
BUN: 37 mg/dL — ABNORMAL HIGH (ref 6–23)
BUN: 43 mg/dL — ABNORMAL HIGH (ref 6–23)
BUN: 46 mg/dL — ABNORMAL HIGH (ref 6–23)
BUN: 63 mg/dL — ABNORMAL HIGH (ref 6–23)
BUN: 66 mg/dL — ABNORMAL HIGH (ref 6–23)
BUN: 71 mg/dL — ABNORMAL HIGH (ref 6–23)
BUN: 73 mg/dL — ABNORMAL HIGH (ref 6–23)
CO2: 24 mEq/L (ref 19–32)
CO2: 24 mEq/L (ref 19–32)
CO2: 25 mEq/L (ref 19–32)
CO2: 25 mEq/L (ref 19–32)
CO2: 25 mEq/L (ref 19–32)
CO2: 26 mEq/L (ref 19–32)
CO2: 26 mEq/L (ref 19–32)
CO2: 26 mEq/L (ref 19–32)
CO2: 26 mEq/L (ref 19–32)
Calcium: 7.4 mg/dL — ABNORMAL LOW (ref 8.4–10.5)
Calcium: 7.5 mg/dL — ABNORMAL LOW (ref 8.4–10.5)
Calcium: 7.7 mg/dL — ABNORMAL LOW (ref 8.4–10.5)
Calcium: 7.8 mg/dL — ABNORMAL LOW (ref 8.4–10.5)
Calcium: 7.9 mg/dL — ABNORMAL LOW (ref 8.4–10.5)
Calcium: 8.4 mg/dL (ref 8.4–10.5)
Calcium: 8.4 mg/dL (ref 8.4–10.5)
Chloride: 100 mEq/L (ref 96–112)
Chloride: 100 mEq/L (ref 96–112)
Chloride: 101 mEq/L (ref 96–112)
Chloride: 104 mEq/L (ref 96–112)
Chloride: 92 mEq/L — ABNORMAL LOW (ref 96–112)
Chloride: 96 mEq/L (ref 96–112)
Creatinine, Ser: 2.93 mg/dL — ABNORMAL HIGH (ref 0.4–1.5)
Creatinine, Ser: 3.47 mg/dL — ABNORMAL HIGH (ref 0.4–1.5)
Creatinine, Ser: 4.42 mg/dL — ABNORMAL HIGH (ref 0.4–1.5)
Creatinine, Ser: 4.57 mg/dL — ABNORMAL HIGH (ref 0.4–1.5)
Creatinine, Ser: 4.96 mg/dL — ABNORMAL HIGH (ref 0.4–1.5)
Creatinine, Ser: 5.74 mg/dL — ABNORMAL HIGH (ref 0.4–1.5)
Creatinine, Ser: 8.66 mg/dL — ABNORMAL HIGH (ref 0.4–1.5)
GFR calc Af Amer: 15 mL/min — ABNORMAL LOW (ref >60)
GFR calc Af Amer: 17 mL/min — ABNORMAL LOW (ref >60)
GFR calc Af Amer: 19 mL/min — ABNORMAL LOW (ref >60)
GFR calc Af Amer: 27 mL/min — ABNORMAL LOW (ref >60)
GFR calc Af Amer: 8 mL/min — ABNORMAL LOW (ref >60)
GFR calc non Af Amer: 14 mL/min — ABNORMAL LOW (ref >60)
GFR calc non Af Amer: 16 mL/min — ABNORMAL LOW (ref >60)
GFR calc non Af Amer: 22 mL/min — ABNORMAL LOW (ref >60)
GFR calc non Af Amer: 6 mL/min — ABNORMAL LOW (ref >60)
Glucose, Bld: 107 mg/dL — ABNORMAL HIGH (ref 70–99)
Glucose, Bld: 112 mg/dL — ABNORMAL HIGH (ref 70–99)
Glucose, Bld: 134 mg/dL — ABNORMAL HIGH (ref 70–99)
Glucose, Bld: 157 mg/dL — ABNORMAL HIGH (ref 70–99)
Glucose, Bld: 73 mg/dL (ref 70–99)
Glucose, Bld: 78 mg/dL (ref 70–99)
Glucose, Bld: 99 mg/dL (ref 70–99)
Phosphorus: 4.7 mg/dL — ABNORMAL HIGH (ref 2.3–4.6)
Phosphorus: 7.6 mg/dL — ABNORMAL HIGH (ref 2.3–4.6)
Phosphorus: 8.1 mg/dL — ABNORMAL HIGH (ref 2.3–4.6)
Phosphorus: 8.3 mg/dL — ABNORMAL HIGH (ref 2.3–4.6)
Potassium: 3.7 mEq/L (ref 3.5–5.1)
Potassium: 3.8 mEq/L (ref 3.5–5.1)
Potassium: 3.8 mEq/L (ref 3.5–5.1)
Potassium: 3.9 mEq/L (ref 3.5–5.1)
Potassium: 3.9 mEq/L (ref 3.5–5.1)
Potassium: 4.1 mEq/L (ref 3.5–5.1)
Sodium: 130 mEq/L — ABNORMAL LOW (ref 135–145)
Sodium: 132 mEq/L — ABNORMAL LOW (ref 135–145)
Sodium: 135 mEq/L (ref 135–145)
Sodium: 135 mEq/L (ref 135–145)

## 2010-11-08 LAB — CBC
HCT: 21.3 % — ABNORMAL LOW (ref 39.0–52.0)
HCT: 25.3 % — ABNORMAL LOW (ref 39.0–52.0)
HCT: 26 % — ABNORMAL LOW (ref 39.0–52.0)
HCT: 26.4 % — ABNORMAL LOW (ref 39.0–52.0)
HCT: 26.8 % — ABNORMAL LOW (ref 39.0–52.0)
HCT: 27.6 % — ABNORMAL LOW (ref 39.0–52.0)
HCT: 28.7 % — ABNORMAL LOW (ref 39.0–52.0)
HCT: 28.9 % — ABNORMAL LOW (ref 39.0–52.0)
HCT: 33.1 % — ABNORMAL LOW (ref 39.0–52.0)
HCT: 35.9 % — ABNORMAL LOW (ref 39.0–52.0)
HCT: 36.1 % — ABNORMAL LOW (ref 39.0–52.0)
Hemoglobin: 8.7 g/dL — ABNORMAL LOW (ref 13.0–17.0)
Hemoglobin: 10.1 g/dL — ABNORMAL LOW (ref 13.0–17.0)
Hemoglobin: 11.3 g/dL — ABNORMAL LOW (ref 13.0–17.0)
Hemoglobin: 11.6 g/dL — ABNORMAL LOW (ref 13.0–17.0)
Hemoglobin: 7 g/dL — ABNORMAL LOW (ref 13.0–17.0)
Hemoglobin: 7.8 g/dL — ABNORMAL LOW (ref 13.0–17.0)
Hemoglobin: 7.8 g/dL — ABNORMAL LOW (ref 13.0–17.0)
Hemoglobin: 8.4 g/dL — ABNORMAL LOW (ref 13.0–17.0)
Hemoglobin: 9.3 g/dL — ABNORMAL LOW (ref 13.0–17.0)
Hemoglobin: 9.3 g/dL — ABNORMAL LOW (ref 13.0–17.0)
Hemoglobin: 9.5 g/dL — ABNORMAL LOW (ref 13.0–17.0)
MCH: 29.2 pg (ref 26.0–34.0)
MCH: 27.7 pg (ref 26.0–34.0)
MCH: 28 pg (ref 26.0–34.0)
MCH: 28 pg (ref 26.0–34.0)
MCH: 28.1 pg (ref 26.0–34.0)
MCH: 28.1 pg (ref 26.0–34.0)
MCH: 28.2 pg (ref 26.0–34.0)
MCH: 28.3 pg (ref 26.0–34.0)
MCH: 28.5 pg (ref 26.0–34.0)
MCH: 28.5 pg (ref 26.0–34.0)
MCH: 28.6 pg (ref 26.0–34.0)
MCH: 28.8 pg (ref 26.0–34.0)
MCH: 28.8 pg (ref 26.0–34.0)
MCHC: 30.6 g/dL (ref 30.0–36.0)
MCHC: 30.8 g/dL (ref 30.0–36.0)
MCHC: 31 g/dL (ref 30.0–36.0)
MCHC: 31.5 g/dL (ref 30.0–36.0)
MCHC: 32.1 g/dL (ref 30.0–36.0)
MCHC: 32.1 g/dL (ref 30.0–36.0)
MCHC: 32.3 g/dL (ref 30.0–36.0)
MCHC: 32.3 g/dL (ref 30.0–36.0)
MCHC: 32.4 g/dL (ref 30.0–36.0)
MCHC: 32.8 g/dL (ref 30.0–36.0)
MCHC: 33 g/dL (ref 30.0–36.0)
MCHC: 33.1 g/dL (ref 30.0–36.0)
MCHC: 33.2 g/dL (ref 30.0–36.0)
MCHC: 33.6 g/dL (ref 30.0–36.0)
MCV: 92.6 fL (ref 78.0–100.0)
MCV: 85.8 fL (ref 78.0–100.0)
MCV: 86.2 fL (ref 78.0–100.0)
MCV: 86.6 fL (ref 78.0–100.0)
MCV: 86.7 fL (ref 78.0–100.0)
MCV: 87.3 fL (ref 78.0–100.0)
MCV: 88.9 fL (ref 78.0–100.0)
MCV: 89 fL (ref 78.0–100.0)
MCV: 90.8 fL (ref 78.0–100.0)
MCV: 91.3 fL (ref 78.0–100.0)
MCV: 91.7 fL (ref 78.0–100.0)
MCV: 91.9 fL (ref 78.0–100.0)
Platelets: 161 10*3/uL (ref 150–400)
Platelets: 169 10*3/uL (ref 150–400)
Platelets: 180 10*3/uL (ref 150–400)
Platelets: 197 10*3/uL (ref 150–400)
Platelets: 221 10*3/uL (ref 150–400)
Platelets: 261 10*3/uL (ref 150–400)
Platelets: 285 10*3/uL (ref 150–400)
Platelets: 290 10*3/uL (ref 150–400)
Platelets: 296 10*3/uL (ref 150–400)
Platelets: 300 10*3/uL (ref 150–400)
Platelets: 321 10*3/uL (ref 150–400)
Platelets: 340 10*3/uL (ref 150–400)
Platelets: 345 10*3/uL (ref 150–400)
Platelets: 376 10*3/uL (ref 150–400)
RBC: 2.98 MIL/uL — ABNORMAL LOW (ref 4.22–5.81)
RBC: 2.53 MIL/uL — ABNORMAL LOW (ref 4.22–5.81)
RBC: 2.77 MIL/uL — ABNORMAL LOW (ref 4.22–5.81)
RBC: 2.84 MIL/uL — ABNORMAL LOW (ref 4.22–5.81)
RBC: 2.95 MIL/uL — ABNORMAL LOW (ref 4.22–5.81)
RBC: 2.97 MIL/uL — ABNORMAL LOW (ref 4.22–5.81)
RBC: 3.3 MIL/uL — ABNORMAL LOW (ref 4.22–5.81)
RBC: 3.33 MIL/uL — ABNORMAL LOW (ref 4.22–5.81)
RBC: 4.1 MIL/uL — ABNORMAL LOW (ref 4.22–5.81)
RDW: 16.1 % — ABNORMAL HIGH (ref 11.5–15.5)
RDW: 16.6 % — ABNORMAL HIGH (ref 11.5–15.5)
RDW: 16.6 % — ABNORMAL HIGH (ref 11.5–15.5)
RDW: 16.7 % — ABNORMAL HIGH (ref 11.5–15.5)
RDW: 16.8 % — ABNORMAL HIGH (ref 11.5–15.5)
RDW: 17 % — ABNORMAL HIGH (ref 11.5–15.5)
RDW: 17.4 % — ABNORMAL HIGH (ref 11.5–15.5)
RDW: 17.7 % — ABNORMAL HIGH (ref 11.5–15.5)
RDW: 18.2 % — ABNORMAL HIGH (ref 11.5–15.5)
RDW: 18.3 % — ABNORMAL HIGH (ref 11.5–15.5)
RDW: 18.4 % — ABNORMAL HIGH (ref 11.5–15.5)
RDW: 18.8 % — ABNORMAL HIGH (ref 11.5–15.5)
WBC: 10.5 10*3/uL (ref 4.0–10.5)
WBC: 11.6 10*3/uL — ABNORMAL HIGH (ref 4.0–10.5)
WBC: 14.4 10*3/uL — ABNORMAL HIGH (ref 4.0–10.5)
WBC: 14.8 10*3/uL — ABNORMAL HIGH (ref 4.0–10.5)
WBC: 15.5 10*3/uL — ABNORMAL HIGH (ref 4.0–10.5)
WBC: 15.8 10*3/uL — ABNORMAL HIGH (ref 4.0–10.5)
WBC: 16.8 10*3/uL — ABNORMAL HIGH (ref 4.0–10.5)
WBC: 18.9 10*3/uL — ABNORMAL HIGH (ref 4.0–10.5)
WBC: 8 10*3/uL (ref 4.0–10.5)
WBC: 8.3 10*3/uL (ref 4.0–10.5)
WBC: 9.7 10*3/uL (ref 4.0–10.5)

## 2010-11-08 LAB — URINE DRUGS OF ABUSE SCREEN W ALC, ROUTINE (REF LAB)
Barbiturate Quant, Ur: NEGATIVE
Benzodiazepines.: NEGATIVE
Creatinine,U: 16.3 mg/dL
Methadone: NEGATIVE
Phencyclidine (PCP): NEGATIVE
Propoxyphene: NEGATIVE

## 2010-11-08 LAB — URINE CULTURE: Special Requests: NEGATIVE

## 2010-11-08 LAB — CARBOXYHEMOGLOBIN
Carboxyhemoglobin: 1.6 % — ABNORMAL HIGH (ref 0.5–1.5)
Carboxyhemoglobin: 1.7 % — ABNORMAL HIGH (ref 0.5–1.5)
Carboxyhemoglobin: 1.7 % — ABNORMAL HIGH (ref 0.5–1.5)
Carboxyhemoglobin: 1.8 % — ABNORMAL HIGH (ref 0.5–1.5)
Carboxyhemoglobin: 1.9 % — ABNORMAL HIGH (ref 0.5–1.5)
Methemoglobin: 0.6 % (ref 0.0–1.5)
Methemoglobin: 0.7 % (ref 0.0–1.5)
Methemoglobin: 0.7 % (ref 0.0–1.5)
Methemoglobin: 1 % (ref 0.0–1.5)
O2 Saturation: 53.2 %
O2 Saturation: 56.3 %
O2 Saturation: 64.7 %
O2 Saturation: 73.1 %
O2 Saturation: 77.2 %
O2 Saturation: 97.8 %
Total hemoglobin: 10.3 g/dL — ABNORMAL LOW (ref 13.5–18.0)
Total hemoglobin: 11.6 g/dL — ABNORMAL LOW (ref 13.5–18.0)
Total hemoglobin: 9.2 g/dL — ABNORMAL LOW (ref 13.5–18.0)

## 2010-11-08 LAB — GLUCOSE, CAPILLARY
Glucose-Capillary: 112 mg/dL — ABNORMAL HIGH (ref 70–99)
Glucose-Capillary: 126 mg/dL — ABNORMAL HIGH (ref 70–99)
Glucose-Capillary: 71 mg/dL (ref 70–99)
Glucose-Capillary: 100 mg/dL — ABNORMAL HIGH (ref 70–99)
Glucose-Capillary: 102 mg/dL — ABNORMAL HIGH (ref 70–99)
Glucose-Capillary: 105 mg/dL — ABNORMAL HIGH (ref 70–99)
Glucose-Capillary: 105 mg/dL — ABNORMAL HIGH (ref 70–99)
Glucose-Capillary: 105 mg/dL — ABNORMAL HIGH (ref 70–99)
Glucose-Capillary: 109 mg/dL — ABNORMAL HIGH (ref 70–99)
Glucose-Capillary: 110 mg/dL — ABNORMAL HIGH (ref 70–99)
Glucose-Capillary: 110 mg/dL — ABNORMAL HIGH (ref 70–99)
Glucose-Capillary: 111 mg/dL — ABNORMAL HIGH (ref 70–99)
Glucose-Capillary: 111 mg/dL — ABNORMAL HIGH (ref 70–99)
Glucose-Capillary: 113 mg/dL — ABNORMAL HIGH (ref 70–99)
Glucose-Capillary: 113 mg/dL — ABNORMAL HIGH (ref 70–99)
Glucose-Capillary: 113 mg/dL — ABNORMAL HIGH (ref 70–99)
Glucose-Capillary: 114 mg/dL — ABNORMAL HIGH (ref 70–99)
Glucose-Capillary: 115 mg/dL — ABNORMAL HIGH (ref 70–99)
Glucose-Capillary: 115 mg/dL — ABNORMAL HIGH (ref 70–99)
Glucose-Capillary: 115 mg/dL — ABNORMAL HIGH (ref 70–99)
Glucose-Capillary: 115 mg/dL — ABNORMAL HIGH (ref 70–99)
Glucose-Capillary: 117 mg/dL — ABNORMAL HIGH (ref 70–99)
Glucose-Capillary: 120 mg/dL — ABNORMAL HIGH (ref 70–99)
Glucose-Capillary: 123 mg/dL — ABNORMAL HIGH (ref 70–99)
Glucose-Capillary: 124 mg/dL — ABNORMAL HIGH (ref 70–99)
Glucose-Capillary: 126 mg/dL — ABNORMAL HIGH (ref 70–99)
Glucose-Capillary: 128 mg/dL — ABNORMAL HIGH (ref 70–99)
Glucose-Capillary: 129 mg/dL — ABNORMAL HIGH (ref 70–99)
Glucose-Capillary: 130 mg/dL — ABNORMAL HIGH (ref 70–99)
Glucose-Capillary: 130 mg/dL — ABNORMAL HIGH (ref 70–99)
Glucose-Capillary: 131 mg/dL — ABNORMAL HIGH (ref 70–99)
Glucose-Capillary: 132 mg/dL — ABNORMAL HIGH (ref 70–99)
Glucose-Capillary: 132 mg/dL — ABNORMAL HIGH (ref 70–99)
Glucose-Capillary: 134 mg/dL — ABNORMAL HIGH (ref 70–99)
Glucose-Capillary: 136 mg/dL — ABNORMAL HIGH (ref 70–99)
Glucose-Capillary: 139 mg/dL — ABNORMAL HIGH (ref 70–99)
Glucose-Capillary: 142 mg/dL — ABNORMAL HIGH (ref 70–99)
Glucose-Capillary: 144 mg/dL — ABNORMAL HIGH (ref 70–99)
Glucose-Capillary: 145 mg/dL — ABNORMAL HIGH (ref 70–99)
Glucose-Capillary: 146 mg/dL — ABNORMAL HIGH (ref 70–99)
Glucose-Capillary: 148 mg/dL — ABNORMAL HIGH (ref 70–99)
Glucose-Capillary: 148 mg/dL — ABNORMAL HIGH (ref 70–99)
Glucose-Capillary: 149 mg/dL — ABNORMAL HIGH (ref 70–99)
Glucose-Capillary: 151 mg/dL — ABNORMAL HIGH (ref 70–99)
Glucose-Capillary: 153 mg/dL — ABNORMAL HIGH (ref 70–99)
Glucose-Capillary: 154 mg/dL — ABNORMAL HIGH (ref 70–99)
Glucose-Capillary: 158 mg/dL — ABNORMAL HIGH (ref 70–99)
Glucose-Capillary: 158 mg/dL — ABNORMAL HIGH (ref 70–99)
Glucose-Capillary: 158 mg/dL — ABNORMAL HIGH (ref 70–99)
Glucose-Capillary: 159 mg/dL — ABNORMAL HIGH (ref 70–99)
Glucose-Capillary: 159 mg/dL — ABNORMAL HIGH (ref 70–99)
Glucose-Capillary: 162 mg/dL — ABNORMAL HIGH (ref 70–99)
Glucose-Capillary: 168 mg/dL — ABNORMAL HIGH (ref 70–99)
Glucose-Capillary: 169 mg/dL — ABNORMAL HIGH (ref 70–99)
Glucose-Capillary: 171 mg/dL — ABNORMAL HIGH (ref 70–99)
Glucose-Capillary: 176 mg/dL — ABNORMAL HIGH (ref 70–99)
Glucose-Capillary: 177 mg/dL — ABNORMAL HIGH (ref 70–99)
Glucose-Capillary: 179 mg/dL — ABNORMAL HIGH (ref 70–99)
Glucose-Capillary: 181 mg/dL — ABNORMAL HIGH (ref 70–99)
Glucose-Capillary: 184 mg/dL — ABNORMAL HIGH (ref 70–99)
Glucose-Capillary: 186 mg/dL — ABNORMAL HIGH (ref 70–99)
Glucose-Capillary: 190 mg/dL — ABNORMAL HIGH (ref 70–99)
Glucose-Capillary: 192 mg/dL — ABNORMAL HIGH (ref 70–99)
Glucose-Capillary: 192 mg/dL — ABNORMAL HIGH (ref 70–99)
Glucose-Capillary: 200 mg/dL — ABNORMAL HIGH (ref 70–99)
Glucose-Capillary: 202 mg/dL — ABNORMAL HIGH (ref 70–99)
Glucose-Capillary: 216 mg/dL — ABNORMAL HIGH (ref 70–99)
Glucose-Capillary: 220 mg/dL — ABNORMAL HIGH (ref 70–99)
Glucose-Capillary: 237 mg/dL — ABNORMAL HIGH (ref 70–99)
Glucose-Capillary: 261 mg/dL — ABNORMAL HIGH (ref 70–99)
Glucose-Capillary: 370 mg/dL — ABNORMAL HIGH (ref 70–99)
Glucose-Capillary: 55 mg/dL — ABNORMAL LOW (ref 70–99)
Glucose-Capillary: 71 mg/dL (ref 70–99)
Glucose-Capillary: 74 mg/dL (ref 70–99)
Glucose-Capillary: 80 mg/dL (ref 70–99)
Glucose-Capillary: 81 mg/dL (ref 70–99)
Glucose-Capillary: 83 mg/dL (ref 70–99)
Glucose-Capillary: 99 mg/dL (ref 70–99)

## 2010-11-08 LAB — BASIC METABOLIC PANEL
BUN: 12 mg/dL (ref 6–23)
BUN: 25 mg/dL — ABNORMAL HIGH (ref 6–23)
BUN: 50 mg/dL — ABNORMAL HIGH (ref 6–23)
BUN: 55 mg/dL — ABNORMAL HIGH (ref 6–23)
BUN: 75 mg/dL — ABNORMAL HIGH (ref 6–23)
CO2: 26 mEq/L (ref 19–32)
CO2: 25 mEq/L (ref 19–32)
CO2: 26 mEq/L (ref 19–32)
CO2: 27 mEq/L (ref 19–32)
Calcium: 7.1 mg/dL — ABNORMAL LOW (ref 8.4–10.5)
Calcium: 7.2 mg/dL — ABNORMAL LOW (ref 8.4–10.5)
Calcium: 7.5 mg/dL — ABNORMAL LOW (ref 8.4–10.5)
Calcium: 7.7 mg/dL — ABNORMAL LOW (ref 8.4–10.5)
Calcium: 8.7 mg/dL (ref 8.4–10.5)
Chloride: 105 mEq/L (ref 96–112)
Chloride: 102 mEq/L (ref 96–112)
Chloride: 90 mEq/L — ABNORMAL LOW (ref 96–112)
Chloride: 96 mEq/L (ref 96–112)
Creatinine, Ser: 0.93 mg/dL (ref 0.4–1.5)
Creatinine, Ser: 2.38 mg/dL — ABNORMAL HIGH (ref 0.4–1.5)
Creatinine, Ser: 3.91 mg/dL — ABNORMAL HIGH (ref 0.4–1.5)
Creatinine, Ser: 5.9 mg/dL — ABNORMAL HIGH (ref 0.4–1.5)
Creatinine, Ser: 6.53 mg/dL — ABNORMAL HIGH (ref 0.4–1.5)
GFR calc Af Amer: 30 mL/min — ABNORMAL LOW (ref >60)
GFR calc Af Amer: 9 mL/min — ABNORMAL LOW (ref >60)
GFR calc non Af Amer: 29 mL/min — ABNORMAL LOW (ref >60)
GFR calc non Af Amer: 6 mL/min — ABNORMAL LOW (ref >60)
GFR calc non Af Amer: 7 mL/min — ABNORMAL LOW (ref >60)
GFR calc non Af Amer: 9 mL/min — ABNORMAL LOW (ref >60)
GFR calc non Af Amer: >60 mL/min (ref >60)
Glucose, Bld: 102 mg/dL — ABNORMAL HIGH (ref 70–99)
Glucose, Bld: 104 mg/dL — ABNORMAL HIGH (ref 70–99)
Glucose, Bld: 110 mg/dL — ABNORMAL HIGH (ref 70–99)
Glucose, Bld: 225 mg/dL — ABNORMAL HIGH (ref 70–99)
Glucose, Bld: 99 mg/dL (ref 70–99)
Potassium: 3.8 mEq/L (ref 3.5–5.1)
Potassium: 3.7 mEq/L (ref 3.5–5.1)
Potassium: 3.8 mEq/L (ref 3.5–5.1)
Potassium: 3.8 mEq/L (ref 3.5–5.1)
Potassium: 4.2 mEq/L (ref 3.5–5.1)
Sodium: 135 mEq/L (ref 135–145)
Sodium: 128 mEq/L — ABNORMAL LOW (ref 135–145)
Sodium: 132 mEq/L — ABNORMAL LOW (ref 135–145)

## 2010-11-08 LAB — IRON AND TIBC
Saturation Ratios: 19 % — ABNORMAL LOW (ref 20–55)
TIBC: 164 ug/dL — ABNORMAL LOW (ref 215–435)
TIBC: 182 ug/dL — ABNORMAL LOW (ref 215–435)

## 2010-11-08 LAB — URINALYSIS, ROUTINE W REFLEX MICROSCOPIC
Glucose, UA: NEGATIVE mg/dL
Ketones, ur: NEGATIVE mg/dL
Protein, ur: NEGATIVE mg/dL
pH: 5 (ref 5.0–8.0)

## 2010-11-08 LAB — COMPREHENSIVE METABOLIC PANEL
ALT: 19 U/L (ref 0–53)
ALT: 27 U/L (ref 0–53)
ALT: 678 U/L — ABNORMAL HIGH (ref 0–53)
AST: 19 U/L (ref 0–37)
AST: 29 U/L (ref 0–37)
AST: 364 U/L — ABNORMAL HIGH (ref 0–37)
AST: 732 U/L — ABNORMAL HIGH (ref 0–37)
Albumin: 1.7 g/dL — ABNORMAL LOW (ref 3.5–5.2)
Albumin: 1.8 g/dL — ABNORMAL LOW (ref 3.5–5.2)
Alkaline Phosphatase: 157 U/L — ABNORMAL HIGH (ref 39–117)
Alkaline Phosphatase: 91 U/L (ref 39–117)
BUN: 61 mg/dL — ABNORMAL HIGH (ref 6–23)
CO2: 25 mEq/L (ref 19–32)
CO2: 26 mEq/L (ref 19–32)
CO2: 26 mEq/L (ref 19–32)
CO2: 28 mEq/L (ref 19–32)
Calcium: 6.7 mg/dL — ABNORMAL LOW (ref 8.4–10.5)
Calcium: 7.3 mg/dL — ABNORMAL LOW (ref 8.4–10.5)
Calcium: 7.3 mg/dL — ABNORMAL LOW (ref 8.4–10.5)
Calcium: 7.7 mg/dL — ABNORMAL LOW (ref 8.4–10.5)
Calcium: 8.1 mg/dL — ABNORMAL LOW (ref 8.4–10.5)
Calcium: 8.3 mg/dL — ABNORMAL LOW (ref 8.4–10.5)
Chloride: 100 mEq/L (ref 96–112)
Chloride: 96 mEq/L (ref 96–112)
Creatinine, Ser: 1.73 mg/dL — ABNORMAL HIGH (ref 0.4–1.5)
Creatinine, Ser: 4.13 mg/dL — ABNORMAL HIGH (ref 0.4–1.5)
Creatinine, Ser: 7.26 mg/dL — ABNORMAL HIGH (ref 0.4–1.5)
GFR calc Af Amer: 14 mL/min — ABNORMAL LOW (ref >60)
GFR calc Af Amer: 18 mL/min — ABNORMAL LOW (ref >60)
GFR calc Af Amer: 25 mL/min — ABNORMAL LOW (ref >60)
GFR calc Af Amer: 50 mL/min — ABNORMAL LOW (ref >60)
GFR calc Af Amer: 9 mL/min — ABNORMAL LOW (ref >60)
GFR calc non Af Amer: 15 mL/min — ABNORMAL LOW (ref >60)
GFR calc non Af Amer: 21 mL/min — ABNORMAL LOW (ref >60)
GFR calc non Af Amer: 41 mL/min — ABNORMAL LOW (ref >60)
GFR calc non Af Amer: 8 mL/min — ABNORMAL LOW (ref >60)
Glucose, Bld: 167 mg/dL — ABNORMAL HIGH (ref 70–99)
Glucose, Bld: 71 mg/dL (ref 70–99)
Potassium: 3.8 mEq/L (ref 3.5–5.1)
Potassium: 4.4 mEq/L (ref 3.5–5.1)
Sodium: 134 mEq/L — ABNORMAL LOW (ref 135–145)
Sodium: 134 mEq/L — ABNORMAL LOW (ref 135–145)
Sodium: 135 mEq/L (ref 135–145)
Total Bilirubin: 0.7 mg/dL (ref 0.3–1.2)
Total Protein: 7 g/dL (ref 6.0–8.3)
Total Protein: 7 g/dL (ref 6.0–8.3)

## 2010-11-08 LAB — ABO/RH: ABO/RH(D): O POS

## 2010-11-08 LAB — LACTIC ACID, PLASMA: Lactic Acid, Venous: 4.2 mmol/L — ABNORMAL HIGH (ref 0.5–2.2)

## 2010-11-08 LAB — PROTIME-INR
INR: 2.72 — ABNORMAL HIGH (ref 0.00–1.49)
INR: 1.84 — ABNORMAL HIGH (ref 0.00–1.49)
INR: 1.94 — ABNORMAL HIGH (ref 0.00–1.49)
INR: 2.02 — ABNORMAL HIGH (ref 0.00–1.49)
INR: 2.03 — ABNORMAL HIGH (ref 0.00–1.49)
INR: 2.06 — ABNORMAL HIGH (ref 0.00–1.49)
INR: 2.13 — ABNORMAL HIGH (ref 0.00–1.49)
INR: 2.37 — ABNORMAL HIGH (ref 0.00–1.49)
INR: 2.47 — ABNORMAL HIGH (ref 0.00–1.49)
INR: 2.88 — ABNORMAL HIGH (ref 0.00–1.49)
INR: 3.24 — ABNORMAL HIGH (ref 0.00–1.49)
Prothrombin Time: 22.3 seconds — ABNORMAL HIGH (ref 11.6–15.2)
Prothrombin Time: 23 seconds — ABNORMAL HIGH (ref 11.6–15.2)
Prothrombin Time: 25 seconds — ABNORMAL HIGH (ref 11.6–15.2)
Prothrombin Time: 26 seconds — ABNORMAL HIGH (ref 11.6–15.2)
Prothrombin Time: 28.5 seconds — ABNORMAL HIGH (ref 11.6–15.2)
Prothrombin Time: 30.2 seconds — ABNORMAL HIGH (ref 11.6–15.2)
Prothrombin Time: 30.8 seconds — ABNORMAL HIGH (ref 11.6–15.2)
Prothrombin Time: 33.1 seconds — ABNORMAL HIGH (ref 11.6–15.2)
Prothrombin Time: 45.3 seconds — ABNORMAL HIGH (ref 11.6–15.2)

## 2010-11-08 LAB — APTT: aPTT: 46 seconds — ABNORMAL HIGH (ref 24–37)

## 2010-11-08 LAB — HEPARIN LEVEL (UNFRACTIONATED)
Heparin Unfractionated: 0.16 IU/mL — ABNORMAL LOW (ref 0.30–0.70)
Heparin Unfractionated: 0.34 IU/mL (ref 0.30–0.70)
Heparin Unfractionated: <0.1 IU/mL — ABNORMAL LOW (ref 0.30–0.70)

## 2010-11-08 LAB — UIFE/LIGHT CHAINS/TP QN, 24-HR UR
Alpha 2, Urine: DETECTED — AB
Free Kappa Lt Chains,Ur: 9.46 mg/dL — ABNORMAL HIGH (ref 0.04–1.51)

## 2010-11-08 LAB — DIFFERENTIAL
Basophils Absolute: 0 10*3/uL (ref 0.0–0.1)
Eosinophils Absolute: 0.3 10*3/uL (ref 0.0–0.7)
Eosinophils Relative: 0 % (ref 0–5)
Eosinophils Relative: 4 % (ref 0–5)
Lymphocytes Relative: 4 % — ABNORMAL LOW (ref 12–46)
Lymphocytes Relative: 6 % — ABNORMAL LOW (ref 12–46)
Lymphs Abs: 0.6 10*3/uL — ABNORMAL LOW (ref 0.7–4.0)
Lymphs Abs: 0.7 10*3/uL (ref 0.7–4.0)
Lymphs Abs: 0.9 10*3/uL (ref 0.7–4.0)
Monocytes Absolute: 0.9 10*3/uL (ref 0.1–1.0)
Monocytes Relative: 8 % (ref 3–12)
Neutro Abs: 13 10*3/uL — ABNORMAL HIGH (ref 1.7–7.7)
Neutrophils Relative %: 87 % — ABNORMAL HIGH (ref 43–77)

## 2010-11-08 LAB — PHOSPHORUS
Phosphorus: 6.2 mg/dL — ABNORMAL HIGH (ref 2.3–4.6)
Phosphorus: 6.7 mg/dL — ABNORMAL HIGH (ref 2.3–4.6)

## 2010-11-08 LAB — FERRITIN: Ferritin: 422 ng/mL — ABNORMAL HIGH (ref 22–322)

## 2010-11-08 LAB — LIPID PANEL
HDL: 34 mg/dL — ABNORMAL LOW (ref >39)
LDL Cholesterol: 74 mg/dL (ref 0–99)
Triglycerides: 65 mg/dL (ref <150)

## 2010-11-08 LAB — TSH
TSH: 1.333 u[IU]/mL (ref 0.350–4.500)
TSH: 2.777 u[IU]/mL (ref 0.350–4.500)

## 2010-11-08 LAB — BLOOD GAS, ARTERIAL
Drawn by: 296031
FIO2: 0.21 %
O2 Saturation: 99.1 %
Patient temperature: 98.6
pO2, Arterial: 134 mmHg — ABNORMAL HIGH (ref 80.0–100.0)

## 2010-11-08 LAB — CULTURE, BLOOD (ROUTINE X 2)
Culture  Setup Time: 201111032049
Culture: NO GROWTH

## 2010-11-08 LAB — IMMUNOFIXATION ELECTROPHORESIS
IgA: 400 mg/dL — ABNORMAL HIGH (ref 68–378)
IgG (Immunoglobin G), Serum: 1730 mg/dL — ABNORMAL HIGH (ref 694–1618)

## 2010-11-08 LAB — URINE MICROSCOPIC-ADD ON

## 2010-11-08 LAB — MRSA PCR SCREENING
MRSA by PCR: NEGATIVE
MRSA by PCR: NEGATIVE

## 2010-11-08 LAB — TYPE AND SCREEN: ABO/RH(D): O POS

## 2010-11-08 LAB — HEPATITIS PANEL, ACUTE
Hep B C IgM: NEGATIVE
Hepatitis B Surface Ag: NEGATIVE

## 2010-11-08 LAB — URINALYSIS, MICROSCOPIC ONLY
Glucose, UA: NEGATIVE mg/dL
Ketones, ur: NEGATIVE mg/dL
Leukocytes, UA: NEGATIVE
pH: 5 (ref 5.0–8.0)

## 2010-11-08 LAB — CARDIAC PANEL(CRET KIN+CKTOT+MB+TROPI)
Relative Index: INVALID (ref 0.0–2.5)
Relative Index: INVALID (ref 0.0–2.5)
Total CK: 35 U/L (ref 7–232)
Troponin I: 0.06 ng/mL (ref 0.00–0.06)
Troponin I: 0.06 ng/mL (ref 0.00–0.06)

## 2010-11-08 LAB — HEPATIC FUNCTION PANEL
ALT: 201 U/L — ABNORMAL HIGH (ref 0–53)
Bilirubin, Direct: 0.3 mg/dL (ref 0.0–0.3)
Indirect Bilirubin: 0.8 mg/dL (ref 0.3–0.9)
Total Bilirubin: 1.1 mg/dL (ref 0.3–1.2)

## 2010-11-08 LAB — HEMOCCULT GUIAC POC 1CARD (OFFICE): Fecal Occult Bld: NEGATIVE

## 2010-11-08 LAB — WOUND CULTURE: Culture: NO GROWTH

## 2010-11-08 LAB — PROTEIN ELECTROPH W RFLX QUANT IMMUNOGLOBULINS
Gamma Globulin: 30.7 % — ABNORMAL HIGH (ref 11.1–18.8)
M-Spike, %: NOT DETECTED g/dL

## 2010-11-08 LAB — HEMOGLOBIN A1C: Mean Plasma Glucose: 286 mg/dL — ABNORMAL HIGH (ref <117)

## 2010-11-08 LAB — IGG, IGA, IGM
IgA: 433 mg/dL — ABNORMAL HIGH (ref 68–378)
IgG (Immunoglobin G), Serum: 1910 mg/dL — ABNORMAL HIGH (ref 694–1618)
IgM, Serum: 31 mg/dL — ABNORMAL LOW (ref 60–263)

## 2010-11-08 LAB — BRAIN NATRIURETIC PEPTIDE: Pro B Natriuretic peptide (BNP): 1902 pg/mL — ABNORMAL HIGH (ref 0.0–100.0)

## 2010-11-08 LAB — MAGNESIUM: Magnesium: 1.8 mg/dL (ref 1.5–2.5)

## 2010-11-08 NOTE — Medication Information (Signed)
Summary: rov/ewj  Anticoagulant Therapy  Managed by: Cloyde Reams, RN, BSN Referring MD: D.Bensimhon Supervising MD: Tenny Craw MD, Gunnar Fusi Indication 1: Atrial Fibrillation Lab Used: [facility] [place] Site: [address] INR POC 2.7 INR RANGE 2.0-3.0  Dietary changes: yes       Details: Eating less vit K  Health status changes: no    Bleeding/hemorrhagic complications: no    Recent/future hospitalizations: no    Any changes in medication regimen? no    Recent/future dental: no  Any missed doses?: no       Is patient compliant with meds? yes       Allergies: No Known Drug Allergies  Anticoagulation Management History:      The patient is taking warfarin and comes in today for a routine follow up visit.  Positive risk factors for bleeding include presence of serious comorbidities.  Negative risk factors for bleeding include an age less than 19 years old.  The bleeding index is 'intermediate risk'.  Positive CHADS2 values include History of CHF, History of HTN, and History of Diabetes.  Negative CHADS2 values include Age > 70 years old.  Anticoagulation responsible provider: Tenny Craw MD, Gunnar Fusi.  INR POC: 2.7.  Cuvette Lot#: [id number].  Exp: 08/2011.    Anticoagulation Management Assessment/Plan:      The patient's current anticoagulation dose is Warfarin sodium 2 mg tabs: as directed.  The target INR is 2.0-3.0.  The next INR is due 11/21/2010.  Anticoagulation instructions were given to patient.  Results were reviewed/authorized by Cloyde Reams, RN, BSN.  He was notified by Cloyde Reams RN.         Prior Anticoagulation Instructions: INR 1.9  Take 1 1/2 tablets today and cut back on the greens a little.  Then resume your normal dose of 1 tablet (3mg ) everyday.  Recheck INR in 2 weeks.     Current Anticoagulation Instructions: INR 2.7  Continue on same dosage 1 tablet daily.  Recheck in 3 weeks.

## 2010-11-09 LAB — POCT I-STAT 3, VENOUS BLOOD GAS (G3P V)
Acid-Base Excess: 5 mmol/L — ABNORMAL HIGH (ref 0.0–2.0)
O2 Saturation: 27 %
pCO2, Ven: 46.2 mmHg (ref 45.0–50.0)

## 2010-11-09 LAB — DIFFERENTIAL
Basophils Absolute: 0 10*3/uL (ref 0.0–0.1)
Basophils Relative: 0 % (ref 0–1)
Monocytes Absolute: 0.6 10*3/uL (ref 0.1–1.0)
Neutro Abs: 14.7 10*3/uL — ABNORMAL HIGH (ref 1.7–7.7)

## 2010-11-09 LAB — BASIC METABOLIC PANEL
BUN: 13 mg/dL (ref 6–23)
Calcium: 7.9 mg/dL — ABNORMAL LOW (ref 8.4–10.5)
Creatinine, Ser: 1.02 mg/dL (ref 0.4–1.5)
GFR calc non Af Amer: >60 mL/min (ref >60)
Glucose, Bld: 467 mg/dL — ABNORMAL HIGH (ref 70–99)
Potassium: 4.4 mEq/L (ref 3.5–5.1)

## 2010-11-09 LAB — CULTURE, BLOOD (ROUTINE X 2)
Culture  Setup Time: 201111010355
Culture: NO GROWTH

## 2010-11-09 LAB — CBC
HCT: 41.9 % (ref 39.0–52.0)
MCHC: 32.7 g/dL (ref 30.0–36.0)
Platelets: 275 10*3/uL (ref 150–400)
RDW: 16.1 % — ABNORMAL HIGH (ref 11.5–15.5)

## 2010-11-09 LAB — PROCALCITONIN: Procalcitonin: 0.23 ng/mL

## 2010-11-09 LAB — KETONES, QUALITATIVE: Acetone, Bld: NEGATIVE

## 2010-11-09 LAB — GLUCOSE, CAPILLARY: Glucose-Capillary: 424 mg/dL — ABNORMAL HIGH (ref 70–99)

## 2010-11-09 LAB — LACTIC ACID, PLASMA: Lactic Acid, Venous: 3.3 mmol/L — ABNORMAL HIGH (ref 0.5–2.2)

## 2010-11-21 ENCOUNTER — Ambulatory Visit (INDEPENDENT_AMBULATORY_CARE_PROVIDER_SITE_OTHER): Payer: Medicaid Other | Admitting: *Deleted

## 2010-11-21 DIAGNOSIS — Z7901 Long term (current) use of anticoagulants: Secondary | ICD-10-CM

## 2010-11-21 DIAGNOSIS — I4891 Unspecified atrial fibrillation: Secondary | ICD-10-CM

## 2010-11-21 NOTE — Patient Instructions (Signed)
INR 2.5  Continue current dosage: 1 tablet (3mg ) every day  Recheck INR in 4 weeks.

## 2010-12-16 ENCOUNTER — Telehealth: Payer: Self-pay | Admitting: Internal Medicine

## 2010-12-16 NOTE — Telephone Encounter (Signed)
PT NEEDS HIS HEART MEDS TO BE CALLED IN TO [facility] # [phone]

## 2010-12-17 MED ORDER — CARVEDILOL 25 MG PO TABS: 25.0000 mg | ORAL_TABLET | Freq: Two times a day (BID) | ORAL | Status: DC

## 2010-12-17 MED ORDER — FUROSEMIDE 40 MG PO TABS: 40.0000 mg | ORAL_TABLET | Freq: Every day | ORAL | Status: DC

## 2010-12-17 MED ORDER — HYDRALAZINE HCL 25 MG PO TABS: 75.0000 mg | ORAL_TABLET | Freq: Three times a day (TID) | ORAL | Status: DC

## 2010-12-17 MED ORDER — ISOSORBIDE MONONITRATE ER 60 MG PO TB24: 60.0000 mg | ORAL_TABLET | ORAL | Status: DC

## 2010-12-17 MED ORDER — AMIODARONE HCL 200 MG PO TABS: 200.0000 mg | ORAL_TABLET | Freq: Every day | ORAL | Status: DC

## 2010-12-19 ENCOUNTER — Ambulatory Visit (INDEPENDENT_AMBULATORY_CARE_PROVIDER_SITE_OTHER): Payer: Medicaid Other | Admitting: *Deleted

## 2010-12-19 ENCOUNTER — Ambulatory Visit: Payer: Medicaid Other | Admitting: *Deleted

## 2010-12-19 ENCOUNTER — Encounter: Payer: Medicaid Other | Admitting: *Deleted

## 2010-12-19 DIAGNOSIS — I4891 Unspecified atrial fibrillation: Secondary | ICD-10-CM

## 2010-12-20 ENCOUNTER — Ambulatory Visit: Payer: Medicaid Other | Admitting: *Deleted

## 2010-12-26 ENCOUNTER — Encounter: Payer: Medicaid Other | Attending: Internal Medicine | Admitting: *Deleted

## 2010-12-26 DIAGNOSIS — Z713 Dietary counseling and surveillance: Secondary | ICD-10-CM | POA: Insufficient documentation

## 2010-12-26 DIAGNOSIS — M869 Osteomyelitis, unspecified: Secondary | ICD-10-CM | POA: Insufficient documentation

## 2010-12-28 ENCOUNTER — Encounter: Payer: Self-pay | Admitting: Internal Medicine

## 2011-01-04 ENCOUNTER — Ambulatory Visit (INDEPENDENT_AMBULATORY_CARE_PROVIDER_SITE_OTHER): Payer: Medicaid Other | Admitting: *Deleted

## 2011-01-04 ENCOUNTER — Other Ambulatory Visit (HOSPITAL_COMMUNITY): Payer: Self-pay | Admitting: Internal Medicine

## 2011-01-04 ENCOUNTER — Ambulatory Visit (INDEPENDENT_AMBULATORY_CARE_PROVIDER_SITE_OTHER): Payer: Medicaid Other | Admitting: Internal Medicine

## 2011-01-04 ENCOUNTER — Encounter: Payer: Self-pay | Admitting: Internal Medicine

## 2011-01-04 ENCOUNTER — Ambulatory Visit (HOSPITAL_COMMUNITY): Payer: Medicaid Other | Attending: Internal Medicine | Admitting: Radiology

## 2011-01-04 VITALS — BP 128/74 | HR 60 | Ht 73.0 in | Wt 217.0 lb

## 2011-01-04 DIAGNOSIS — I4891 Unspecified atrial fibrillation: Secondary | ICD-10-CM | POA: Insufficient documentation

## 2011-01-04 DIAGNOSIS — I5022 Chronic systolic (congestive) heart failure: Secondary | ICD-10-CM

## 2011-01-04 DIAGNOSIS — S88119A Complete traumatic amputation at level between knee and ankle, unspecified lower leg, initial encounter: Secondary | ICD-10-CM

## 2011-01-04 NOTE — Assessment & Plan Note (Signed)
Maintaining SR. Will stop amio. Continue coumadin

## 2011-01-04 NOTE — Patient Instructions (Addendum)
Your physician recommends that you schedule a follow-up appointment in: 3 months with Dr. Gala Romney Your physician recommends that you schedule a follow-up appointment in: Soon with Dr. Lajoyce Corners for wound follow-up Your physician has recommended you make the following change in your medication:  STOP: Amiodarone

## 2011-01-04 NOTE — Progress Notes (Signed)
HPI:  Brian Martin is a 55 year old male admitted in 11/11 with a left foot ulcer complicated by osteomyelitis.  He subsequently underwent left below-the-knee amputation.  Hospitalization c/b atrial fib and acute systolic HF. ECHO showed a dilated cardiomyopathy with an EF of 25-30% and moderate mitral regurgitation on echocardiogram.  Transesophageal echocardiogram demonstrated no vegetation with an EF of 35%.  He developed acute renal failure secondary to acute tubular necrosis in the setting of sepsis with a Cr of 9.0  He had a prolonged hospitalization culminating in rehabilitation.  He was discharged from rehabilitation December 2.  He presents for followup.  Had nuke study which showed EF 43%.  Mildly decreased uptake in the inferior wall and infero-apex. Suspect soft tissue attenuation but cannot exclude previous infarct. No ischemia. Last Cr 1.5  He is doing well.  He is back home. He denies chest pain.  He denies significant changes in dyspnea. No syncope.  No orthopnea or PND. No CP/dyspnea. No palpitations. No edema. Has not been fitted for LLE prosthesis yet due to persistent stump wound. (has not been following with Dr. Lajoyce Corners)  No bleeding with coumadin but has not had it followed up in at least a month.    ROS: All systems negative except as listed in HPI, PMH and Problem List.  Past Medical History  Diagnosis Date  . Atrial fibrillation   . CHF (congestive heart failure)     chronic systolic CHF  . Diabetes mellitus     type 2  . Hypertension   . Chronic renal failure     Current Outpatient Prescriptions  Medication Sig Dispense Refill  . amiodarone (PACERONE) 200 MG tablet Take 1 tablet (200 mg total) by mouth daily.  30 tablet  9  . aspirin 81 MG tablet Take 81 mg by mouth daily.        . carvedilol (COREG) 25 MG tablet Take 1 tablet (25 mg total) by mouth 2 (two) times daily.  60 tablet  9  . furosemide (LASIX) 40 MG tablet Take 1 tablet (40 mg total) by mouth daily.  30 tablet   9  . hydrALAZINE (APRESOLINE) 25 MG tablet Take 3 tablets (75 mg total) by mouth 3 (three) times daily.  810 tablet  9  . insulin glargine (LANTUS) 100 UNIT/ML injection Inject 10 Units into the skin at bedtime.        . isosorbide mononitrate (IMDUR) 60 MG 24 hr tablet Take 1 tablet (60 mg total) by mouth every morning.  30 tablet  9  . warfarin (COUMADIN) 2 MG tablet Take by mouth as directed.        Marland Kitchen HYDROcodone-acetaminophen (VICODIN) 5-500 MG per tablet Take 1 tablet by mouth every 4 (four) hours as needed.        . methocarbamol (ROBAXIN) 500 MG tablet Take 500 mg by mouth as needed. One tab every 6 hours as needed          PHYSICAL EXAM: Filed Vitals:   01/04/11 1038  BP: 128/74  Pulse: 60   General:  Chronically ill appearing, well developed, in no acute distress HEENT: normal Neck: no JVD Cardiac:  regular. normal S1, S2; RRR; no murmur Lungs:  clear to auscultation bilaterally, no wheezing, rhonchi or rales Abd: soft, nontender, no hepatomegaly Ext: No edema on R. L stump looks ok (i had to remove stitches from 11/11 today) Skin: warm and dry;  Neuro:  CNs 2-12 intact, no focal abnormalities noted    ECG:  SR 60 No ST-T wave abnormalities.    ASSESSMENT & PLAN:

## 2011-01-04 NOTE — Assessment & Plan Note (Addendum)
Much improved. Volume status looks good. Will repeat echo. No ACE-I yet given severe renal failure recently. Will check with Dr. Hyman Hopes if we can start. Continue current regimen. Myoview was low-risk. Although cardiomyopathy may be ischemic I would not pursue cath at this point given renal dysfx and absence of anginal sx.

## 2011-01-04 NOTE — Assessment & Plan Note (Signed)
I removed stitches today. Will need f/u with Dr. Lajoyce Corners.

## 2011-01-06 ENCOUNTER — Encounter: Payer: Self-pay | Admitting: Internal Medicine

## 2011-01-17 ENCOUNTER — Telehealth: Payer: Self-pay | Admitting: Internal Medicine

## 2011-01-17 NOTE — Telephone Encounter (Signed)
Pt needs refill on isosorbmono 60mg  qd pls call to [facility] in [place]

## 2011-01-17 NOTE — Telephone Encounter (Signed)
Already done

## 2011-01-25 ENCOUNTER — Encounter: Payer: Self-pay | Admitting: Internal Medicine

## 2011-01-30 ENCOUNTER — Ambulatory Visit (INDEPENDENT_AMBULATORY_CARE_PROVIDER_SITE_OTHER): Payer: Medicaid Other | Admitting: *Deleted

## 2011-01-30 DIAGNOSIS — I4891 Unspecified atrial fibrillation: Secondary | ICD-10-CM

## 2011-02-27 ENCOUNTER — Ambulatory Visit (INDEPENDENT_AMBULATORY_CARE_PROVIDER_SITE_OTHER): Payer: Medicaid Other | Admitting: *Deleted

## 2011-02-27 DIAGNOSIS — I4891 Unspecified atrial fibrillation: Secondary | ICD-10-CM

## 2011-02-27 LAB — POCT INR: INR: 1.6

## 2011-03-02 ENCOUNTER — Telehealth: Payer: Self-pay | Admitting: Internal Medicine

## 2011-03-02 NOTE — Telephone Encounter (Signed)
Pt requesting insulin refill, said he requested at the front desk and again with coumadin while he was here Monday 7-2 and hasn't heard back uses  [facility] [place] [phone]

## 2011-03-02 NOTE — Telephone Encounter (Signed)
Unable to leave mess will try again later

## 2011-03-03 MED ORDER — INSULIN GLARGINE 100 UNIT/ML ~~LOC~~ SOLN: 10.0000 [IU] | Freq: Every day | SUBCUTANEOUS | Status: DC

## 2011-03-03 NOTE — Telephone Encounter (Signed)
Spoke w/pt he states he is in process of trying to get pcp, advised I would fill it this time w/1 refill and then it must come from pcp

## 2011-03-20 ENCOUNTER — Other Ambulatory Visit: Payer: Self-pay | Admitting: Pharmacist

## 2011-03-20 ENCOUNTER — Ambulatory Visit (INDEPENDENT_AMBULATORY_CARE_PROVIDER_SITE_OTHER): Payer: Medicaid Other | Admitting: *Deleted

## 2011-03-20 DIAGNOSIS — I4891 Unspecified atrial fibrillation: Secondary | ICD-10-CM

## 2011-03-20 LAB — POCT INR: INR: 1.5

## 2011-03-20 MED ORDER — WARFARIN SODIUM 3 MG PO TABS: ORAL_TABLET | ORAL | Status: DC

## 2011-04-03 ENCOUNTER — Ambulatory Visit (INDEPENDENT_AMBULATORY_CARE_PROVIDER_SITE_OTHER): Payer: Medicaid Other | Admitting: *Deleted

## 2011-04-03 DIAGNOSIS — I4891 Unspecified atrial fibrillation: Secondary | ICD-10-CM

## 2011-04-17 ENCOUNTER — Ambulatory Visit (INDEPENDENT_AMBULATORY_CARE_PROVIDER_SITE_OTHER): Payer: Medicaid Other | Admitting: Internal Medicine

## 2011-04-17 ENCOUNTER — Encounter: Payer: Self-pay | Admitting: Internal Medicine

## 2011-04-17 VITALS — BP 122/78 | HR 74 | Ht 73.0 in

## 2011-04-17 DIAGNOSIS — S81009A Unspecified open wound, unspecified knee, initial encounter: Secondary | ICD-10-CM | POA: Insufficient documentation

## 2011-04-17 DIAGNOSIS — I5022 Chronic systolic (congestive) heart failure: Secondary | ICD-10-CM

## 2011-04-17 DIAGNOSIS — S81809A Unspecified open wound, unspecified lower leg, initial encounter: Secondary | ICD-10-CM

## 2011-04-17 DIAGNOSIS — I4891 Unspecified atrial fibrillation: Secondary | ICD-10-CM

## 2011-04-17 DIAGNOSIS — S91009A Unspecified open wound, unspecified ankle, initial encounter: Secondary | ICD-10-CM

## 2011-04-17 NOTE — Progress Notes (Signed)
HPI:  Brian Martin is a 55 year old male admitted in 11/11 with a left foot ulcer complicated by osteomyelitis.  He subsequently underwent left below-the-knee amputation.  Hospitalization c/b atrial fib and acute systolic HF. ECHO showed a dilated cardiomyopathy with an EF of 25-30% and moderate mitral regurgitation on echocardiogram.  Transesophageal echocardiogram demonstrated no vegetation with an EF of 35%.  He developed acute renal failure secondary to acute tubular necrosis in the setting of sepsis with a Cr of 9.0  He had a prolonged hospitalization culminating in rehabilitation.  He was discharged from rehabilitation December 2.    Had nuke study which showed EF 43%.  Mildly decreased uptake in the inferior wall and infero-apex. Suspect soft tissue attenuation but cannot exclude previous infarct. No ischemia.     Repeat echo 12/2010 showed normalized EF. Although cardiomyopathy may be ischemic, with low risk myoviow cath was not pursued with given renal dysfx and absence of anginal sx.  Cr. 2.03 in May 2012.  His amiodarone was stopped in 12/2010 because he was maintaining sinus rhythm, he was continued on coumadin.  He has been fitted for LLE prosthesis and followed by Dr. Lajoyce Corners.  He is starting rehab this week.  Able to walk with crutches.  He returns for routine follow up today. He is doing well. Denies any chest pain, dyspnea, orthopnea, PND or syncope.  No edema.  Has not been using compression hose as much because of nonhealing ulcer, this has improved after removing hose.  Has discussed adding an ACE-I with Dr. Hyman Hopes and he advises against it as Cr is remaining around 2.    No bleeding with coumadin but had subtherapeutic levels in July, last INR 2 April 03, 2011.     ROS: All systems negative except as listed in HPI, PMH and Problem List.  Past Medical History  Diagnosis Date  . Atrial fibrillation   . CHF (congestive heart failure)     chronic systolic CHF  . Diabetes mellitus     type 2  .  Hypertension   . Chronic renal failure     Current Outpatient Prescriptions  Medication Sig Dispense Refill  . aspirin 81 MG tablet Take 81 mg by mouth daily.        . carvedilol (COREG) 25 MG tablet Take 1 tablet (25 mg total) by mouth 2 (two) times daily.  60 tablet  9  . furosemide (LASIX) 40 MG tablet Take 1 tablet (40 mg total) by mouth daily.  30 tablet  9  . hydrALAZINE (APRESOLINE) 25 MG tablet Take 3 tablets (75 mg total) by mouth 3 (three) times daily.  810 tablet  9  . insulin glargine (LANTUS) 100 UNIT/ML injection Inject 10 Units into the skin at bedtime.  10 mL  1  . isosorbide mononitrate (IMDUR) 60 MG 24 hr tablet Take 1 tablet (60 mg total) by mouth every morning.  30 tablet  9  . methocarbamol (ROBAXIN) 500 MG tablet Take 500 mg by mouth as needed. One tab every 6 hours as needed       . warfarin (COUMADIN) 3 MG tablet Take as directed by Anticoagulation clinic (Pt takes up to 1 1/2 tablets daily)  45 tablet  2     PHYSICAL EXAM: Filed Vitals:   04/17/11 1052  BP: 122/78  Pulse: 74   General:  Chronically ill appearing, well developed, in no acute distress HEENT: normal Neck: 6 JVP Cardiac:  regular. normal S1, S2; RRR; no murmur Lungs:  clear to auscultation bilaterally, no wheezing, rhonchi or rales Abd: soft, nontender, no hepatomegaly Ext: Trace edema on R.  Healing intact scab on R foot.  L stump with prosthesis in place  Skin: warm and dry;  Neuro:  CNs 2-12 intact, no focal abnormalities noted   ECG: NSR 74 No ST-T wave abnormalities.    ASSESSMENT & PLAN:

## 2011-04-17 NOTE — Assessment & Plan Note (Signed)
LV function normalized. Volume status looks good.  No ACE-I with CKD.  Will continue current medical regimen. F/u HF clinic in 6 months.

## 2011-04-17 NOTE — Assessment & Plan Note (Signed)
Doing well. Small sore on R ankle. Has f/u with Dr. Lajoyce Corners. Have suggested llama socks.

## 2011-04-17 NOTE — Assessment & Plan Note (Signed)
Transient post-op AF. Now off amio and maintaining SR. Will continue coumadin for now. If in NSR at next visit can consider stopping.

## 2011-04-17 NOTE — Patient Instructions (Signed)
Your physician wants you to follow-up in:  6 months. You will receive a reminder letter in the mail two months in advance. If you don't receive a letter, please call our office to schedule the follow-up appointment.   

## 2011-04-24 ENCOUNTER — Ambulatory Visit (INDEPENDENT_AMBULATORY_CARE_PROVIDER_SITE_OTHER): Payer: Medicaid Other | Admitting: *Deleted

## 2011-04-24 DIAGNOSIS — I4891 Unspecified atrial fibrillation: Secondary | ICD-10-CM

## 2011-05-15 ENCOUNTER — Ambulatory Visit (INDEPENDENT_AMBULATORY_CARE_PROVIDER_SITE_OTHER): Payer: Medicaid Other | Admitting: *Deleted

## 2011-05-15 DIAGNOSIS — I4891 Unspecified atrial fibrillation: Secondary | ICD-10-CM

## 2011-05-15 LAB — POCT INR: INR: 2

## 2011-06-05 ENCOUNTER — Ambulatory Visit (INDEPENDENT_AMBULATORY_CARE_PROVIDER_SITE_OTHER): Payer: Medicaid Other | Admitting: *Deleted

## 2011-06-05 DIAGNOSIS — I4891 Unspecified atrial fibrillation: Secondary | ICD-10-CM

## 2011-06-11 ENCOUNTER — Other Ambulatory Visit: Payer: Self-pay | Admitting: Internal Medicine

## 2011-06-26 ENCOUNTER — Ambulatory Visit (INDEPENDENT_AMBULATORY_CARE_PROVIDER_SITE_OTHER): Payer: Medicaid Other | Admitting: *Deleted

## 2011-06-26 DIAGNOSIS — I4891 Unspecified atrial fibrillation: Secondary | ICD-10-CM

## 2011-07-24 ENCOUNTER — Ambulatory Visit (INDEPENDENT_AMBULATORY_CARE_PROVIDER_SITE_OTHER): Payer: Medicaid Other | Admitting: *Deleted

## 2011-07-24 DIAGNOSIS — I4891 Unspecified atrial fibrillation: Secondary | ICD-10-CM

## 2011-07-24 LAB — POCT INR: INR: 3.8

## 2011-08-03 ENCOUNTER — Encounter: Payer: Medicaid Other | Admitting: *Deleted

## 2011-08-04 ENCOUNTER — Ambulatory Visit (INDEPENDENT_AMBULATORY_CARE_PROVIDER_SITE_OTHER): Payer: Medicaid Other | Admitting: *Deleted

## 2011-08-04 DIAGNOSIS — I4891 Unspecified atrial fibrillation: Secondary | ICD-10-CM

## 2011-08-04 LAB — POCT INR: INR: 2.3

## 2011-09-01 ENCOUNTER — Ambulatory Visit (INDEPENDENT_AMBULATORY_CARE_PROVIDER_SITE_OTHER): Payer: Medicaid Other | Admitting: *Deleted

## 2011-09-01 DIAGNOSIS — I4891 Unspecified atrial fibrillation: Secondary | ICD-10-CM

## 2011-09-01 LAB — POCT INR: INR: 2

## 2011-09-12 ENCOUNTER — Other Ambulatory Visit: Payer: Self-pay | Admitting: Internal Medicine

## 2011-09-14 ENCOUNTER — Other Ambulatory Visit: Payer: Self-pay | Admitting: Internal Medicine

## 2011-09-29 ENCOUNTER — Ambulatory Visit (INDEPENDENT_AMBULATORY_CARE_PROVIDER_SITE_OTHER): Payer: Medicaid Other | Admitting: Pharmacist

## 2011-09-29 DIAGNOSIS — I4891 Unspecified atrial fibrillation: Secondary | ICD-10-CM

## 2011-10-16 ENCOUNTER — Other Ambulatory Visit: Payer: Self-pay | Admitting: Internal Medicine

## 2011-10-27 ENCOUNTER — Ambulatory Visit (INDEPENDENT_AMBULATORY_CARE_PROVIDER_SITE_OTHER): Payer: Medicaid Other | Admitting: *Deleted

## 2011-10-27 DIAGNOSIS — I4891 Unspecified atrial fibrillation: Secondary | ICD-10-CM

## 2011-10-27 DIAGNOSIS — Z7901 Long term (current) use of anticoagulants: Secondary | ICD-10-CM

## 2011-10-27 NOTE — Patient Instructions (Signed)
Anticoagulant therapy guidelines reviewed with patient

## 2011-11-27 ENCOUNTER — Ambulatory Visit (INDEPENDENT_AMBULATORY_CARE_PROVIDER_SITE_OTHER): Payer: Medicaid Other | Admitting: Pharmacist

## 2011-11-27 DIAGNOSIS — Z7901 Long term (current) use of anticoagulants: Secondary | ICD-10-CM

## 2011-11-27 DIAGNOSIS — I4891 Unspecified atrial fibrillation: Secondary | ICD-10-CM

## 2011-11-27 LAB — POCT INR: INR: 1.7

## 2011-12-18 ENCOUNTER — Ambulatory Visit (INDEPENDENT_AMBULATORY_CARE_PROVIDER_SITE_OTHER): Payer: Medicaid Other

## 2011-12-18 DIAGNOSIS — I4891 Unspecified atrial fibrillation: Secondary | ICD-10-CM

## 2011-12-18 DIAGNOSIS — Z7901 Long term (current) use of anticoagulants: Secondary | ICD-10-CM

## 2011-12-18 LAB — POCT INR: INR: 2.3

## 2011-12-20 ENCOUNTER — Other Ambulatory Visit: Payer: Self-pay | Admitting: Internal Medicine

## 2012-01-15 ENCOUNTER — Ambulatory Visit (INDEPENDENT_AMBULATORY_CARE_PROVIDER_SITE_OTHER): Payer: Medicaid Other | Admitting: *Deleted

## 2012-01-15 DIAGNOSIS — Z7901 Long term (current) use of anticoagulants: Secondary | ICD-10-CM

## 2012-01-15 DIAGNOSIS — I4891 Unspecified atrial fibrillation: Secondary | ICD-10-CM

## 2012-01-19 ENCOUNTER — Telehealth: Payer: Self-pay | Admitting: Pharmacist

## 2012-01-19 ENCOUNTER — Other Ambulatory Visit: Payer: Self-pay | Admitting: Internal Medicine

## 2012-01-19 NOTE — Telephone Encounter (Signed)
Pt called to report bruising around his elbow area.  Bruise appeared several days ago.  He was putting his prosthetic leg on and had a sharp pain go through his arm.  The next day he noticed a large bruise.  States its about 8 in x 4 in.  No hard knots, no overt bleeding. Pt does not have any pain at this point.  He has full range of motion with his arm.  His INR was elevated on 5/20 and dose adjusted.  Asked pt to monitor, treat with ice/heat packs and eat some extra greens.  If it worsens over the weekend, he was asked to go to urgent care/ER for evaluation.  Pt expressed understanding.

## 2012-02-16 ENCOUNTER — Ambulatory Visit (INDEPENDENT_AMBULATORY_CARE_PROVIDER_SITE_OTHER): Payer: Medicaid Other | Admitting: Pharmacist

## 2012-02-16 DIAGNOSIS — Z7901 Long term (current) use of anticoagulants: Secondary | ICD-10-CM

## 2012-02-16 DIAGNOSIS — I4891 Unspecified atrial fibrillation: Secondary | ICD-10-CM

## 2012-02-16 LAB — POCT INR: INR: 2

## 2012-03-11 ENCOUNTER — Ambulatory Visit (INDEPENDENT_AMBULATORY_CARE_PROVIDER_SITE_OTHER): Payer: Medicaid Other | Admitting: *Deleted

## 2012-03-11 DIAGNOSIS — I4891 Unspecified atrial fibrillation: Secondary | ICD-10-CM

## 2012-03-11 DIAGNOSIS — Z7901 Long term (current) use of anticoagulants: Secondary | ICD-10-CM

## 2012-03-25 ENCOUNTER — Ambulatory Visit (INDEPENDENT_AMBULATORY_CARE_PROVIDER_SITE_OTHER): Payer: Medicaid Other

## 2012-03-25 DIAGNOSIS — Z7901 Long term (current) use of anticoagulants: Secondary | ICD-10-CM

## 2012-03-25 DIAGNOSIS — I4891 Unspecified atrial fibrillation: Secondary | ICD-10-CM

## 2012-03-27 ENCOUNTER — Encounter (HOSPITAL_COMMUNITY): Payer: Self-pay

## 2012-03-27 ENCOUNTER — Emergency Department (HOSPITAL_COMMUNITY)
Admission: EM | Admit: 2012-03-27 | Discharge: 2012-03-27 | Disposition: A | Discharge status: Home / Self Care | Payer: Medicaid Other | Attending: Emergency Medicine | Admitting: Emergency Medicine

## 2012-03-27 ENCOUNTER — Emergency Department (HOSPITAL_COMMUNITY): Payer: Medicaid Other

## 2012-03-27 DIAGNOSIS — I4891 Unspecified atrial fibrillation: Secondary | ICD-10-CM | POA: Insufficient documentation

## 2012-03-27 DIAGNOSIS — E119 Type 2 diabetes mellitus without complications: Secondary | ICD-10-CM | POA: Insufficient documentation

## 2012-03-27 DIAGNOSIS — W010XXA Fall on same level from slipping, tripping and stumbling without subsequent striking against object, initial encounter: Secondary | ICD-10-CM | POA: Insufficient documentation

## 2012-03-27 DIAGNOSIS — I129 Hypertensive chronic kidney disease with stage 1 through stage 4 chronic kidney disease, or unspecified chronic kidney disease: Secondary | ICD-10-CM | POA: Insufficient documentation

## 2012-03-27 DIAGNOSIS — Z79899 Other long term (current) drug therapy: Secondary | ICD-10-CM | POA: Insufficient documentation

## 2012-03-27 DIAGNOSIS — Y9229 Other specified public building as the place of occurrence of the external cause: Secondary | ICD-10-CM | POA: Insufficient documentation

## 2012-03-27 DIAGNOSIS — N189 Chronic kidney disease, unspecified: Secondary | ICD-10-CM | POA: Insufficient documentation

## 2012-03-27 DIAGNOSIS — W19XXXA Unspecified fall, initial encounter: Secondary | ICD-10-CM

## 2012-03-27 DIAGNOSIS — S8001XA Contusion of right knee, initial encounter: Secondary | ICD-10-CM

## 2012-03-27 DIAGNOSIS — Z87891 Personal history of nicotine dependence: Secondary | ICD-10-CM | POA: Insufficient documentation

## 2012-03-27 DIAGNOSIS — S88119A Complete traumatic amputation at level between knee and ankle, unspecified lower leg, initial encounter: Secondary | ICD-10-CM | POA: Insufficient documentation

## 2012-03-27 DIAGNOSIS — Z794 Long term (current) use of insulin: Secondary | ICD-10-CM | POA: Insufficient documentation

## 2012-03-27 DIAGNOSIS — S8000XA Contusion of unspecified knee, initial encounter: Secondary | ICD-10-CM | POA: Insufficient documentation

## 2012-03-27 DIAGNOSIS — S20219A Contusion of unspecified front wall of thorax, initial encounter: Secondary | ICD-10-CM | POA: Insufficient documentation

## 2012-03-27 DIAGNOSIS — I509 Heart failure, unspecified: Secondary | ICD-10-CM | POA: Insufficient documentation

## 2012-03-27 HISTORY — DX: Complete traumatic amputation of unspecified lower leg, level unspecified, initial encounter: S88.919A

## 2012-03-27 MED ORDER — HYDROCODONE-ACETAMINOPHEN 5-500 MG PO TABS: 1.0000 | ORAL_TABLET | Freq: Four times a day (QID) | ORAL | Status: AC | PRN

## 2012-03-27 NOTE — ED Notes (Signed)
Abrasion to R knee dressed with bacitracin and band-aid.

## 2012-03-27 NOTE — ED Notes (Signed)
Patient reports that he was coming out of the [employer] at [place] and tripped on a floor mat and landed on his right knee. And hit hit two fingers on the automatic door. Patient also c/o pain right rib area.

## 2012-03-27 NOTE — ED Notes (Signed)
Per EMS: Pt fell at [facility] in [place] at 13:45 today. States pt tripped on mat at door.  Denies loc.  Landed on right knee and right side. C/o right knee and right flank pain from fall.  Pt ambulatory but has a LBKA and uses walker at home. Pt went to physicians office in [place] and called ems to bring pt to hospital for knee evaluation. VSS. NAD.

## 2012-03-27 NOTE — ED Provider Notes (Signed)
History     CSN: [id number]  Arrival date & time 03/27/12  1736   First MD Initiated Contact with Patient 03/27/12 1941      Chief Complaint  Patient presents with  . Fall  . Knee Pain    (Consider location/radiation/quality/duration/timing/severity/associated sxs/prior treatment) HPI Comments: TALLIS Martin is a 56 y.o. Male who fell today after tripping or a rug at [employer]. States fell into right knee. Swelling and bruising to the knee, pain with movement. Also hit right ribs, pain with palpation, movement, deep breathing. Did not take any medications. Left BKA chronic. Did not hit head, no LOC. Pt is on coumadin.    Past Medical History  Diagnosis Date  . Atrial fibrillation   . CHF (congestive heart failure)     chronic systolic CHF  . Diabetes mellitus     type 2  . Hypertension   . Chronic renal failure   . Amputation of leg     Past Surgical History  Procedure Date  . 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 Diabetes Other     History  Substance Use Topics  . Smoking status: Former Games developer  . Smokeless tobacco: Not on file  . Alcohol Use: No      Review of Systems  Constitutional: Negative for fever and chills.  HENT: Negative for neck pain and neck stiffness.   Respiratory: Positive for chest tightness. Negative for cough and shortness of breath.   Cardiovascular: Positive for chest pain. Negative for leg swelling.  Gastrointestinal: Negative for nausea, vomiting and abdominal pain.  Musculoskeletal: Positive for joint swelling and arthralgias. Negative for back pain.  Neurological: Negative for weakness and numbness.    Allergies  Review of patient's allergies indicates no known allergies.  Home Medications   Current Outpatient Rx  Name Route Sig Dispense Refill  . AMLODIPINE BESYLATE 2.5 MG PO TABS Oral Take 2.5 mg by mouth daily.    . ASPIRIN 81 MG PO TABS Oral Take 81 mg by  mouth daily.      Marland Kitchen CARVEDILOL 25 MG PO TABS Oral Take 25 mg by mouth 2 (two) times daily with a meal.    . FUROSEMIDE 40 MG PO TABS Oral Take 40 mg by mouth daily.    Marland Kitchen HYDRALAZINE HCL 25 MG PO TABS Oral Take 75 mg by mouth 3 (three) times daily.    . INSULIN GLARGINE 100 UNIT/ML Jay SOLN Subcutaneous Inject 10 Units into the skin at bedtime.    . ISOSORBIDE MONONITRATE ER 60 MG PO TB24 Oral Take 60 mg by mouth daily.    . WARFARIN SODIUM 3 MG PO TABS Oral Take 4.5 mg by mouth daily.    Marland Kitchen CARVEDILOL 25 MG PO TABS Oral Take 1 tablet (25 mg total) by mouth 2 (two) times daily. 60 tablet 9  . ISOSORBIDE MONONITRATE ER 60 MG PO TB24 Oral Take 1 tablet (60 mg total) by mouth every morning. 30 tablet 9    BP 130/77  Pulse 89  Temp 98.2 F (36.8 C) (Oral)  Resp 16  SpO2 100%  Physical Exam  Nursing note and vitals reviewed. Constitutional: He is oriented to person, place, and time. He appears well-developed and well-nourished. No distress.  HENT:  Head: Normocephalic and atraumatic.  Eyes: Pupils are equal, round, and reactive to light.  Neck: Neck supple.  Cardiovascular: Normal rate, regular rhythm and  normal heart sounds.   Pulmonary/Chest: Effort normal and breath sounds normal. No respiratory distress. He has no wheezes. He has no rales.       Tenderness over right lower ribs. No bruising, swelling, crepitus  Abdominal: Soft. Bowel sounds are normal. He exhibits no distension. There is no tenderness.  Musculoskeletal:       Left BKA. Right knee swelling over patella. Full ROM of the knee. No joint effusion. No pain or swelling to the calf, compartments normal. Tender to palpation over patella  Neurological: He is alert and oriented to person, place, and time.  Skin: Skin is warm and dry.  Psychiatric: He has a normal mood and affect.    ED Course  Procedures (including critical care time)  Pt post fall. He has some swelling over right knee, however, swelling appers to the  patellar region and not the joint, pt has full ROM of the knee. No compartment syndrome at this time. xrays normal.   Results for orders placed in visit on 03/25/12  POCT INR      Component Value Range   INR 2.1     Dg Ribs Unilateral W/chest Right  03/27/2012  *RADIOLOGY REPORT*  Clinical Data: Fall.  Rib pain.  RIGHT RIBS AND CHEST - 3+ VIEW  Comparison: 07/07/2010  Findings: Cardiomediastinal silhouette is within normal limits. Lungs are free of focal consolidations and pleural effusions.  No pneumothorax.  Oblique views show no acute rib fractures.  IMPRESSION: Negative exam.  Original Report Authenticated By: Patterson Hammersmith, M.D.   Dg Knee Complete 4 Views Right  03/27/2012  *RADIOLOGY REPORT*  Clinical Data: Fall.  Knee pain.  Pain, swelling, bruising.  RIGHT KNEE - COMPLETE 4+ VIEW  Comparison: 07/16/2010  Findings: Bones appear somewhat radiolucent.  There is mild tri compartmental degenerative change.  No evidence for acute fracture or subluxation.  Note is made of popliteal artery calcification. There is soft tissue swelling along the anterior aspect of the knee, below the patella.  IMPRESSION:  1.  No evidence for fracture. 2.  Anterior soft tissue swelling.  Original Report Authenticated By: Patterson Hammersmith, M.D.    INR yesterday 2.1. Will d/c home with pain management. Pt has been in ED for 3.5 hrs, condition has not changed. Pt comfortable with going home.    1. Contusion of right knee   2. Contusion of ribs   3. Fall       MDM          Lottie Mussel, [place] 03/28/12 [phone]

## 2012-03-29 NOTE — ED Provider Notes (Signed)
Medical screening examination/treatment/procedure(s) were performed by non-physician practitioner and as supervising physician I was immediately available for consultation/collaboration.  Ryley Bachtel, MD 03/29/12 1320 

## 2012-04-22 ENCOUNTER — Ambulatory Visit (HOSPITAL_COMMUNITY)
Admission: RE | Admit: 2012-04-22 | Discharge: 2012-04-22 | Disposition: A | Discharge status: Home / Self Care | Payer: Medicaid Other | Source: Ambulatory Visit | Attending: Internal Medicine | Admitting: Internal Medicine

## 2012-04-22 ENCOUNTER — Ambulatory Visit (INDEPENDENT_AMBULATORY_CARE_PROVIDER_SITE_OTHER): Payer: Medicaid Other | Admitting: *Deleted

## 2012-04-22 VITALS — BP 140/72 | HR 85 | Wt 219.5 lb

## 2012-04-22 DIAGNOSIS — I4891 Unspecified atrial fibrillation: Secondary | ICD-10-CM

## 2012-04-22 DIAGNOSIS — I428 Other cardiomyopathies: Secondary | ICD-10-CM

## 2012-04-22 DIAGNOSIS — Z7901 Long term (current) use of anticoagulants: Secondary | ICD-10-CM

## 2012-04-22 DIAGNOSIS — I10 Essential (primary) hypertension: Secondary | ICD-10-CM

## 2012-04-22 DIAGNOSIS — R079 Chest pain, unspecified: Secondary | ICD-10-CM

## 2012-04-22 MED ORDER — AMLODIPINE BESYLATE 5 MG PO TABS: 5.0000 mg | ORAL_TABLET | Freq: Every day | ORAL | Status: DC

## 2012-04-22 NOTE — Assessment & Plan Note (Addendum)
No heart failure symptoms, appears stable.  Continue current therapy.  Follow up 3 months with echo.   Patient seen and examined with Ulyess Blossom, PA-C. We discussed all aspects of the encounter. I agree with the assessment and plan as stated above. EF has normalized. No clinical HF. On good meds. Will continue. F/u echo 3 months.

## 2012-04-22 NOTE — Patient Instructions (Addendum)
Increase Amlodipine to 5 mg daily  Your physician has requested that you have an echocardiogram. Echocardiography is a painless test that uses sound waves to create images of your heart. It provides your doctor with information about the size and shape of your heart and how well your heart's chambers and valves are working. This procedure takes approximately one hour. There are no restrictions for this procedure.  IN 3 MONTHS  We will contact you in 3 months to schedule your next appointment.

## 2012-04-22 NOTE — Assessment & Plan Note (Addendum)
Increase norvasc 5 mg daily.    Attending: BP up. Increase amlodipine.

## 2012-04-22 NOTE — Progress Notes (Signed)
HPI:  Brian Martin is a 56 year old male admitted in 06/2010 with a left foot ulcer complicated by osteomyelitis.  He subsequently underwent left below-the-knee amputation, now with LLE prosthesis (followed by Dr. Lajoyce Corners).  Hospitalization c/b atrial fib and acute systolic HF. ECHO showed a dilated cardiomyopathy with an EF of 25-30% and moderate mitral regurgitation on echocardiogram.  Transesophageal echocardiogram demonstrated no vegetation with an EF of 35%.  He developed acute renal failure secondary to acute tubular necrosis in the setting of sepsis with a Cr of 9.0  He had a prolonged hospitalization culminating in rehabilitation.    09/12/10: Nuclear stress showed EF 43%.  Mildly decreased uptake in the inferior wall and infero-apex. Suspect soft tissue attenuation but cannot exclude previous infarct. No ischemia.     Repeat echo 12/2010 showed normalized EF. Although cardiomyopathy may be ischemic, with low risk myoviow cath was not pursued with given renal dysfx and absence of anginal sx.  Cr. 2.03 in May 2012.  His amiodarone was stopped in 12/2010 because he was maintaining sinus rhythm, he was continued on coumadin.    He returns for follow up today.  He is doing well.  He denies chest pain, dyspnea, orthopnea/PND or syncope.  He is having his prothesis refitted on Friday.  He says he has chest discomfort when he gets mad at his son (happened once) but not when he walks.  No bleeding.       ROS: All systems negative except as listed in HPI, PMH and Problem List.  Past Medical History  Diagnosis Date  . Atrial fibrillation   . CHF (congestive heart failure)     chronic systolic CHF  . Diabetes mellitus     type 2  . Hypertension   . Chronic renal failure   . Amputation of leg     Current Outpatient Prescriptions  Medication Sig Dispense Refill  . amLODipine (NORVASC) 2.5 MG tablet Take 2.5 mg by mouth daily.      Marland Kitchen aspirin 81 MG tablet Take 81 mg by mouth daily.        . carvedilol  (COREG) 25 MG tablet Take 25 mg by mouth 2 (two) times daily with a meal.      . furosemide (LASIX) 40 MG tablet Take 40 mg by mouth daily.      . hydrALAZINE (APRESOLINE) 25 MG tablet Take 75 mg by mouth 3 (three) times daily.      . insulin glargine (LANTUS) 100 UNIT/ML injection Inject 10 Units into the skin at bedtime.      . isosorbide mononitrate (IMDUR) 60 MG 24 hr tablet Take 60 mg by mouth daily.      Marland Kitchen warfarin (COUMADIN) 3 MG tablet Take 4.5 mg by mouth daily.      . carvedilol (COREG) 25 MG tablet Take 1 tablet (25 mg total) by mouth 2 (two) times daily.  60 tablet  9  . isosorbide mononitrate (IMDUR) 60 MG 24 hr tablet Take 1 tablet (60 mg total) by mouth every morning.  30 tablet  9     PHYSICAL EXAM: Filed Vitals:   04/22/12 1344  BP: 140/72  Pulse: 85  Weight: 219 lb 8 oz (99.565 kg)  SpO2: 98%    General:  Chronically ill appearing, well developed, in no acute distress HEENT: normal Neck: JVP  7-8 Cardiac:  regular. normal S1, S2; RRR; no murmur Lungs:  clear to auscultation bilaterally, no wheezing, rhonchi or rales Abd: soft, nontender, no hepatomegaly Ext: Trace  edema on R. LLE prothesis in place.    Skin: warm and dry;  Neuro:  CNs 2-12 intact, no focal abnormalities noted      ASSESSMENT & PLAN:

## 2012-04-27 DIAGNOSIS — R079 Chest pain, unspecified: Secondary | ICD-10-CM | POA: Insufficient documentation

## 2012-04-27 NOTE — Assessment & Plan Note (Signed)
Only occurs with emotional upset. Previous Myoview was low risk. Would be increased risk for contrast nephropathy due to CRI. He knows to call if CP progressive.

## 2012-05-24 ENCOUNTER — Ambulatory Visit (INDEPENDENT_AMBULATORY_CARE_PROVIDER_SITE_OTHER): Payer: Medicaid Other | Admitting: *Deleted

## 2012-05-24 DIAGNOSIS — Z7901 Long term (current) use of anticoagulants: Secondary | ICD-10-CM

## 2012-05-24 DIAGNOSIS — I4891 Unspecified atrial fibrillation: Secondary | ICD-10-CM

## 2012-05-24 LAB — POCT INR: INR: 1.9

## 2012-05-30 ENCOUNTER — Other Ambulatory Visit: Payer: Self-pay | Admitting: Internal Medicine

## 2012-05-30 ENCOUNTER — Other Ambulatory Visit: Payer: Self-pay | Admitting: *Deleted

## 2012-06-17 ENCOUNTER — Ambulatory Visit (INDEPENDENT_AMBULATORY_CARE_PROVIDER_SITE_OTHER): Payer: Medicaid Other | Admitting: *Deleted

## 2012-06-17 DIAGNOSIS — I4891 Unspecified atrial fibrillation: Secondary | ICD-10-CM

## 2012-06-17 DIAGNOSIS — Z7901 Long term (current) use of anticoagulants: Secondary | ICD-10-CM

## 2012-06-17 LAB — POCT INR: INR: 2.6

## 2012-07-15 ENCOUNTER — Ambulatory Visit (INDEPENDENT_AMBULATORY_CARE_PROVIDER_SITE_OTHER): Payer: Medicaid Other | Admitting: Pharmacist

## 2012-07-15 DIAGNOSIS — I4891 Unspecified atrial fibrillation: Secondary | ICD-10-CM

## 2012-07-15 DIAGNOSIS — Z7901 Long term (current) use of anticoagulants: Secondary | ICD-10-CM

## 2012-08-13 ENCOUNTER — Encounter (HOSPITAL_COMMUNITY): Payer: Self-pay

## 2012-08-13 ENCOUNTER — Encounter (HOSPITAL_COMMUNITY): Payer: Self-pay | Admitting: Vascular Surgery

## 2012-08-13 ENCOUNTER — Ambulatory Visit (INDEPENDENT_AMBULATORY_CARE_PROVIDER_SITE_OTHER): Payer: Medicaid Other | Admitting: Pharmacist

## 2012-08-13 ENCOUNTER — Telehealth (HOSPITAL_COMMUNITY): Payer: Self-pay | Admitting: Cardiology

## 2012-08-13 ENCOUNTER — Ambulatory Visit (HOSPITAL_COMMUNITY)
Admission: RE | Admit: 2012-08-13 | Discharge: 2012-08-13 | Disposition: A | Discharge status: Home / Self Care | Payer: Medicaid Other | Source: Ambulatory Visit | Attending: Internal Medicine | Admitting: Internal Medicine

## 2012-08-13 ENCOUNTER — Ambulatory Visit (HOSPITAL_BASED_OUTPATIENT_CLINIC_OR_DEPARTMENT_OTHER)
Admission: RE | Admit: 2012-08-13 | Discharge: 2012-08-13 | Disposition: A | Discharge status: Home / Self Care | Payer: Medicaid Other | Source: Ambulatory Visit | Attending: Internal Medicine | Admitting: Internal Medicine

## 2012-08-13 VITALS — BP 144/96 | HR 65 | Ht 73.0 in | Wt 254.1 lb

## 2012-08-13 DIAGNOSIS — I10 Essential (primary) hypertension: Secondary | ICD-10-CM | POA: Insufficient documentation

## 2012-08-13 DIAGNOSIS — I517 Cardiomegaly: Secondary | ICD-10-CM

## 2012-08-13 DIAGNOSIS — I4891 Unspecified atrial fibrillation: Secondary | ICD-10-CM

## 2012-08-13 DIAGNOSIS — I5022 Chronic systolic (congestive) heart failure: Secondary | ICD-10-CM

## 2012-08-13 DIAGNOSIS — Z7901 Long term (current) use of anticoagulants: Secondary | ICD-10-CM

## 2012-08-13 DIAGNOSIS — Z87891 Personal history of nicotine dependence: Secondary | ICD-10-CM | POA: Insufficient documentation

## 2012-08-13 DIAGNOSIS — E119 Type 2 diabetes mellitus without complications: Secondary | ICD-10-CM | POA: Insufficient documentation

## 2012-08-13 LAB — POCT INR: INR: 2.4

## 2012-08-13 NOTE — Progress Notes (Signed)
Echocardiogram 2D Echocardiogram has been performed.  Brian Martin 08/13/2012, 12:50 PM

## 2012-08-13 NOTE — Telephone Encounter (Signed)
Order placed for scheduled ECHO

## 2012-08-13 NOTE — Progress Notes (Signed)
Patient ID: TONATIUH MALLON, male   DOB: November 23, 1955, 56 y.o.   MRN: [id number] PCP: Dr Yetta Flock Nephrologist: Hyman Hopes HPI:  Elijah Birk is a 56 year old male admitted in 06/2010 with a left foot ulcer complicated by osteomyelitis.  He subsequently underwent left below-the-knee amputation, now with LLE prosthesis (followed by Dr. Lajoyce Corners).  Hospitalization c/b atrial fib and acute systolic HF. ECHO showed a dilated cardiomyopathy with an EF of 25-30% and moderate mitral regurgitation on echocardiogram.  Transesophageal echocardiogram demonstrated no vegetation with an EF of 35%.  He developed acute renal failure secondary to acute tubular necrosis in the setting of sepsis with a Cr of 9.0  He had a prolonged hospitalization culminating in rehabilitation.    09/12/10: Nuclear stress showed EF 43%.  Mildly decreased uptake in the inferior wall and infero-apex. Suspect soft tissue attenuation but cannot exclude previous infarct. No ischemia.     Repeat echo 12/2010 showed normalized EF. Although cardiomyopathy may be ischemic, with low risk myoviow cath was not pursued with given renal dysfx and absence of anginal sx.  Cr. 2.03 in May 2012.  His amiodarone was stopped in 12/2010 because he was maintaining sinus rhythm, he was continued on coumadin.    08/13/12 ECHO EF 50-55% Dr Gala Romney reviewed personally.   He returns for follow up today.  Denies SOB/PND/Orthopnea/CP. No bleeding problems. Not currently weighing at home. Compliant with medications. Works at [employer].  Following yearly by nephrologist. Not currently exercising. He is eating high salt foods such as bacon and [place] foods. He eats out 3-4 times per week.     ROS: All systems negative except as listed in HPI, PMH and Problem List.  Past Medical History  Diagnosis Date  . Atrial fibrillation   . CHF (congestive heart failure)     chronic systolic CHF  . Diabetes mellitus     type 2  . Hypertension   . Chronic renal failure   . Amputation  of leg     Current Outpatient Prescriptions  Medication Sig Dispense Refill  . amLODipine (NORVASC) 5 MG tablet Take 1 tablet (5 mg total) by mouth daily.  30 tablet  6  . aspirin 81 MG tablet Take 81 mg by mouth daily.        . carvedilol (COREG) 25 MG tablet Take 25 mg by mouth 2 (two) times daily with a meal.      . furosemide (LASIX) 40 MG tablet Take 40 mg by mouth daily.      . hydrALAZINE (APRESOLINE) 25 MG tablet Take 75 mg by mouth 3 (three) times daily.      . insulin glargine (LANTUS) 100 UNIT/ML injection Inject 10 Units into the skin at bedtime.      . isosorbide mononitrate (IMDUR) 60 MG 24 hr tablet Take 60 mg by mouth daily.      Marland Kitchen warfarin (COUMADIN) 3 MG tablet Take as directed by coumadin  clinic  50 tablet  2  . isosorbide mononitrate (IMDUR) 60 MG 24 hr tablet Take 1 tablet (60 mg total) by mouth every morning.  30 tablet  9     PHYSICAL EXAM: Filed Vitals:   08/13/12 1132  BP: 144/96  Pulse: 65  Height: 6\' 1"  (1.854 m)  Weight: 254 lb 1.9 oz (115.268 kg)  SpO2: 100%    General: Chronically ill appearing, well developed, in no acute distress wife present  HEENT: normal Neck: JVP  7-8 Cardiac:  regular. normal S1, S2; RRR; no murmur Lungs:  clear to auscultation bilaterally, no wheezing, rhonchi or rales Abd: soft, nontender, no hepatomegaly Ext: Trace edema on RLE.  LLE prothesis in place.    Skin: warm and dry;  Neuro:  CNs 2-12 intact, no focal abnormalities noted      ASSESSMENT & PLAN:

## 2012-08-13 NOTE — Assessment & Plan Note (Addendum)
NYHA II. Dr Gala Romney reviewed and discussed ECHO during OV. Volume status stable despite weight gain. He has liberalized his diet and eating high salt diet. Reinforced daily weights and low salt food choices. Follow up in 3 months.   Patient seen and examined with Tonye Becket, NP. We discussed all aspects of the encounter. I agree with the assessment and plan as stated above. I have reviewed echo personally. EF ~ 50%. Volume status stable on exam. Reinforced need for daily weights, dietary restriction and reviewed use of sliding scale diuretics. Continue current med regimen. Not cath candidate or on ACE due to renal failure. Recheck labs today.

## 2012-08-13 NOTE — Patient Instructions (Addendum)
Follow up in 3 months   Do the following things EVERYDAY: 1) Weigh yourself in the morning before breakfast. Write it down and keep it in a log. 2) Take your medicines as prescribed 3) Eat low salt foods-Limit salt (sodium) to 2000 mg per day.  4) Stay as active as you can everyday 5) Limit all fluids for the day to less than 2 liters  

## 2012-08-16 NOTE — Assessment & Plan Note (Signed)
H/o PAF. Continue coumadin.

## 2012-09-14 ENCOUNTER — Other Ambulatory Visit: Payer: Self-pay | Admitting: Internal Medicine

## 2012-09-24 ENCOUNTER — Ambulatory Visit (INDEPENDENT_AMBULATORY_CARE_PROVIDER_SITE_OTHER): Payer: Medicaid Other | Admitting: *Deleted

## 2012-09-24 DIAGNOSIS — I4891 Unspecified atrial fibrillation: Secondary | ICD-10-CM

## 2012-09-24 DIAGNOSIS — Z7901 Long term (current) use of anticoagulants: Secondary | ICD-10-CM

## 2012-09-24 LAB — POCT INR: INR: 3.2

## 2012-10-11 ENCOUNTER — Other Ambulatory Visit: Payer: Self-pay | Admitting: Internal Medicine

## 2012-10-18 ENCOUNTER — Encounter (HOSPITAL_COMMUNITY): Payer: Self-pay | Admitting: Cardiology

## 2012-10-18 ENCOUNTER — Telehealth (HOSPITAL_COMMUNITY): Payer: Self-pay | Admitting: Cardiology

## 2012-10-18 NOTE — Telephone Encounter (Signed)
pts phone not accepting calls at this time. Letter mailed

## 2012-10-29 ENCOUNTER — Ambulatory Visit (INDEPENDENT_AMBULATORY_CARE_PROVIDER_SITE_OTHER): Payer: Medicaid Other | Admitting: *Deleted

## 2012-10-29 DIAGNOSIS — I4891 Unspecified atrial fibrillation: Secondary | ICD-10-CM

## 2012-10-29 DIAGNOSIS — Z7901 Long term (current) use of anticoagulants: Secondary | ICD-10-CM

## 2012-10-29 LAB — POCT INR: INR: 2

## 2012-11-14 ENCOUNTER — Other Ambulatory Visit: Payer: Self-pay | Admitting: Internal Medicine

## 2012-11-15 ENCOUNTER — Ambulatory Visit (HOSPITAL_COMMUNITY)
Admission: RE | Admit: 2012-11-15 | Discharge: 2012-11-15 | Disposition: A | Discharge status: Home / Self Care | Payer: Medicaid Other | Source: Ambulatory Visit | Attending: Internal Medicine | Admitting: Internal Medicine

## 2012-11-15 VITALS — BP 124/78 | HR 77 | Wt 280.5 lb

## 2012-11-15 DIAGNOSIS — I10 Essential (primary) hypertension: Secondary | ICD-10-CM | POA: Insufficient documentation

## 2012-11-15 DIAGNOSIS — I5022 Chronic systolic (congestive) heart failure: Secondary | ICD-10-CM | POA: Insufficient documentation

## 2012-11-15 NOTE — Patient Instructions (Addendum)
Follow up in 3 months   Do the following things EVERYDAY: 1) Weigh yourself in the morning before breakfast. Write it down and keep it in a log. 2) Take your medicines as prescribed 3) Eat low salt foods-Limit salt (sodium) to 2000 mg per day.  4) Stay as active as you can everyday 5) Limit all fluids for the day to less than 2 liters  

## 2012-11-15 NOTE — Assessment & Plan Note (Addendum)
NYHA I. Volume status stable despite gain. Weight gain likely due to increased portion over the last 3 months. Continue current regimen.Encouraged to reduce portions.  Follow up in 3 months.

## 2012-11-15 NOTE — Assessment & Plan Note (Signed)
Stable  Continue current regimen  

## 2012-11-15 NOTE — Progress Notes (Addendum)
Patient ID: Brian Martin, male   DOB: 30-Jun-1956, 57 y.o.   MRN: [id number] PCP: Dr Yetta Flock Nephrologist: Hyman Hopes HPI: Brian Martin is a 57 year old male admitted in 06/2010 with a left foot ulcer complicated by osteomyelitis.  He subsequently underwent left below-the-knee amputation, now with LLE prosthesis (followed by Dr. Lajoyce Corners).  Hospitalization c/b atrial fib and acute systolic HF. ECHO showed a dilated cardiomyopathy with an EF of 25-30% and moderate mitral regurgitation on echocardiogram.  Transesophageal echocardiogram demonstrated no vegetation with an EF of 35%.  He developed acute renal failure secondary to acute tubular necrosis in the setting of sepsis with a Cr of 9.0  He had a prolonged hospitalization culminating in rehabilitation.    09/12/10: Nuclear stress showed EF 43%.  Mildly decreased uptake in the inferior wall and infero-apex. Suspect soft tissue attenuation but cannot exclude previous infarct. No ischemia.     Repeat echo 12/2010 showed normalized EF. Although cardiomyopathy may be ischemic, with low risk myoviow cath was not pursued with given renal dysfx and absence of anginal sx.  Cr. 2.03 in May 2012.  His amiodarone was stopped in 12/2010 because he was maintaining sinus rhythm, he was continued on coumadin.    08/13/12 ECHO EF 50-55% Dr Gala Romney reviewed personally.   He returns for follow up today.  Denies SOB/PND/Orthopnea/CP. No bleeding problems.Not currently weighing at home. Compliant with medications. Says he is eating extra meals though out the day.  He says he enjoys eating with his family. Walks with a cane. LLE prosthetic used daily.    ROS: All systems negative except as listed in HPI, PMH and Problem List.  Past Medical History  Diagnosis Date  . Atrial fibrillation   . CHF (congestive heart failure)     chronic systolic CHF  . Diabetes mellitus     type 2  . Hypertension   . Chronic renal failure   . Amputation of leg     Current Outpatient Prescriptions   Medication Sig Dispense Refill  . amLODipine (NORVASC) 5 MG tablet Take 1 tablet (5 mg total) by mouth daily.  30 tablet  6  . aspirin 81 MG tablet Take 81 mg by mouth daily.        . carvedilol (COREG) 25 MG tablet Take 25 mg by mouth 2 (two) times daily with a meal.      . furosemide (LASIX) 40 MG tablet Take 40 mg by mouth daily.      . hydrALAZINE (APRESOLINE) 25 MG tablet TAKE THREE TABLETS BY MOUTH THREE TIMES DAILY  270 tablet  3  . insulin glargine (LANTUS) 100 UNIT/ML injection Inject 10 Units into the skin at bedtime.      . isosorbide mononitrate (IMDUR) 60 MG 24 hr tablet Take 60 mg by mouth daily.      Marland Kitchen warfarin (COUMADIN) 3 MG tablet TAKE AS DIRECTED BY COUMADIN CLINIC  50 tablet  3  . isosorbide mononitrate (IMDUR) 60 MG 24 hr tablet Take 1 tablet (60 mg total) by mouth every morning.  30 tablet  9   No current facility-administered medications for this encounter.     PHYSICAL EXAM: Filed Vitals:   11/15/12 0940  BP: 124/78  Pulse: 77  Weight: 280 lb 8 oz (127.234 kg)  SpO2: 99%   Weight 254>280 pounds General: Chronically ill appearing, well developed, in no acute distress wife present  HEENT: normal Neck: JVP  7-8 Cardiac:  regular. normal S1, S2; RRR; no murmur Lungs:  clear to  auscultation bilaterally, no wheezing, rhonchi or rales Abd: soft, nontender, no hepatomegaly Ext: No edema on RLE.  LLE prothesis in place.    Skin: warm and dry;  Neuro:  CNs 2-12 intact, no focal abnormalities noted      ASSESSMENT & PLAN:

## 2012-11-25 ENCOUNTER — Other Ambulatory Visit (HOSPITAL_COMMUNITY): Payer: Self-pay | Admitting: Internal Medicine

## 2012-11-26 ENCOUNTER — Other Ambulatory Visit (HOSPITAL_COMMUNITY): Payer: Self-pay | Admitting: Adult Health

## 2012-11-26 MED ORDER — AMLODIPINE BESYLATE 5 MG PO TABS: 5.0000 mg | ORAL_TABLET | Freq: Every day | ORAL | Status: DC

## 2012-12-03 ENCOUNTER — Ambulatory Visit (INDEPENDENT_AMBULATORY_CARE_PROVIDER_SITE_OTHER): Payer: Medicaid Other

## 2012-12-03 DIAGNOSIS — Z7901 Long term (current) use of anticoagulants: Secondary | ICD-10-CM

## 2012-12-03 DIAGNOSIS — I4891 Unspecified atrial fibrillation: Secondary | ICD-10-CM

## 2013-01-14 ENCOUNTER — Ambulatory Visit (INDEPENDENT_AMBULATORY_CARE_PROVIDER_SITE_OTHER): Payer: Medicaid Other | Admitting: *Deleted

## 2013-01-14 DIAGNOSIS — Z7901 Long term (current) use of anticoagulants: Secondary | ICD-10-CM

## 2013-01-14 DIAGNOSIS — I4891 Unspecified atrial fibrillation: Secondary | ICD-10-CM

## 2013-01-14 LAB — POCT INR: INR: 2.2

## 2013-02-17 ENCOUNTER — Encounter (HOSPITAL_COMMUNITY): Payer: Self-pay

## 2013-02-17 ENCOUNTER — Ambulatory Visit (HOSPITAL_COMMUNITY)
Admission: RE | Admit: 2013-02-17 | Discharge: 2013-02-17 | Disposition: A | Discharge status: Home / Self Care | Payer: Medicaid Other | Source: Ambulatory Visit | Attending: Internal Medicine | Admitting: Internal Medicine

## 2013-02-17 VITALS — BP 148/80 | HR 78 | Wt 295.1 lb

## 2013-02-17 DIAGNOSIS — Z7982 Long term (current) use of aspirin: Secondary | ICD-10-CM | POA: Insufficient documentation

## 2013-02-17 DIAGNOSIS — Z7901 Long term (current) use of anticoagulants: Secondary | ICD-10-CM | POA: Insufficient documentation

## 2013-02-17 DIAGNOSIS — E119 Type 2 diabetes mellitus without complications: Secondary | ICD-10-CM | POA: Insufficient documentation

## 2013-02-17 DIAGNOSIS — S88119A Complete traumatic amputation at level between knee and ankle, unspecified lower leg, initial encounter: Secondary | ICD-10-CM | POA: Insufficient documentation

## 2013-02-17 DIAGNOSIS — I509 Heart failure, unspecified: Secondary | ICD-10-CM | POA: Insufficient documentation

## 2013-02-17 DIAGNOSIS — Z794 Long term (current) use of insulin: Secondary | ICD-10-CM | POA: Insufficient documentation

## 2013-02-17 DIAGNOSIS — I129 Hypertensive chronic kidney disease with stage 1 through stage 4 chronic kidney disease, or unspecified chronic kidney disease: Secondary | ICD-10-CM | POA: Insufficient documentation

## 2013-02-17 DIAGNOSIS — I5022 Chronic systolic (congestive) heart failure: Secondary | ICD-10-CM

## 2013-02-17 DIAGNOSIS — N189 Chronic kidney disease, unspecified: Secondary | ICD-10-CM | POA: Insufficient documentation

## 2013-02-17 DIAGNOSIS — Z79899 Other long term (current) drug therapy: Secondary | ICD-10-CM | POA: Insufficient documentation

## 2013-02-17 NOTE — Progress Notes (Signed)
Patient ID: Brian Martin, male   DOB: 05-22-1956, 57 y.o.   MRN: [id number] PCP: Dr Yetta Flock Nephrologist: [facility] HPI: Brian Martin is a 57 year old male admitted in 06/2010 with a left foot ulcer complicated by osteomyelitis.  He subsequently underwent left below-the-knee amputation, now with LLE prosthesis (followed by Dr. Lajoyce Corners).  Hospitalization c/b atrial fib and acute systolic HF. ECHO showed a dilated cardiomyopathy with an EF of 25-30% and moderate MR.   TEE no vegetation with an EF of 35%.  He developed acute renal failure secondary to acute tubular necrosis in the setting of sepsis with a Cr of 9.0.     09/12/10: Nuclear stress showed EF 43%.  Mildly decreased uptake in the inferior wall and infero-apex. Suspect soft tissue attenuation but cannot exclude previous infarct. No ischemia.     Repeat echo 12/2010 showed normalized EF. Although cardiomyopathy may be ischemic, with low risk myoviow cath was not pursued with given renal dysfx and absence of anginal sx.  Cr. 2.03 in May 2012.  His amiodarone was stopped in 12/2010 because he was maintaining sinus rhythm, he was continued on coumadin.    08/13/12 ECHO EF 45-50%   He returns for follow up today.  Denies SOB/PND/Orthopnea/CP. No bleeding problems.Not currently weighing at home because he doesn't have a scale. His PCP cut him lasix back to 40 mg daily. Not exercising. He says he is trying to cut back on portions but still eating large dinner at night. Compliant with medications.  Walks with a cane. LLE prosthetic used daily. BP at home 120s over 80s.    ROS: All systems negative except as listed in HPI, PMH and Problem List.  Past Medical History  Diagnosis Date  . Atrial fibrillation   . CHF (congestive heart failure)     chronic systolic CHF  . Diabetes mellitus     type 2  . Hypertension   . Chronic renal failure   . Amputation of leg     Current Outpatient Prescriptions  Medication Sig Dispense Refill  . amLODipine  (NORVASC) 5 MG tablet Take 1 tablet (5 mg total) by mouth daily.  30 tablet  6  . aspirin 81 MG tablet Take 81 mg by mouth daily.        . carvedilol (COREG) 25 MG tablet Take 25 mg by mouth 2 (two) times daily with a meal.      . furosemide (LASIX) 40 MG tablet Take 40 mg by mouth daily. Take 20mg  daily.      . hydrALAZINE (APRESOLINE) 25 MG tablet TAKE THREE TABLETS BY MOUTH THREE TIMES DAILY  270 tablet  3  . insulin glargine (LANTUS) 100 UNIT/ML injection Inject 10 Units into the skin at bedtime.      . isosorbide mononitrate (IMDUR) 60 MG 24 hr tablet Take 1 tablet (60 mg total) by mouth every morning.  30 tablet  9  . isosorbide mononitrate (IMDUR) 60 MG 24 hr tablet Take 60 mg by mouth daily.      Marland Kitchen warfarin (COUMADIN) 3 MG tablet TAKE AS DIRECTED BY COUMADIN CLINIC  50 tablet  3   No current facility-administered medications for this encounter.     PHYSICAL EXAM: Filed Vitals:   02/17/13 0915  BP: 148/80  Pulse: 78  Weight: 295 lb 1.9 oz (133.866 kg)  SpO2: 99%   Weight 254>280>295 pounds General: Chronically ill appearing, well developed, in no acute distress wife present  HEENT: normal Neck: JVP 5-6 Cardiac:  regular. normal S1, S2; RRR; no murmur Lungs:  clear to auscultation bilaterally, no wheezing, rhonchi or rales Abd: obese, soft, nontender, no hepatomegaly Ext: No edema on RLE.  LLE prothesis in place.    Skin: warm and dry;  Neuro:  CNs 2-12 intact, no focal abnormalities noted      ASSESSMENT & PLAN:

## 2013-02-17 NOTE — Assessment & Plan Note (Signed)
Encouraged to cut back on his portions and start exercising daily. I suggested walking daily at least 20 minutes.

## 2013-02-17 NOTE — Assessment & Plan Note (Signed)
Volume status stable despite weight gain. Continue current diuretic regimen. I have given him a scale and educated on purpose and that he will need to weigh and record daily. Provided weight chart. Reinforced portion control and limiting high salt foods. Follow up in 3 months.

## 2013-02-17 NOTE — Patient Instructions (Addendum)
Follow up in 3 months   Do the following things EVERYDAY: 1) Weigh yourself in the morning before breakfast. Write it down and keep it in a log. 2) Take your medicines as prescribed 3) Eat low salt foods-Limit salt (sodium) to 2000 mg per day.  4) Stay as active as you can everyday 5) Limit all fluids for the day to less than 2 liters  

## 2013-02-18 ENCOUNTER — Other Ambulatory Visit (HOSPITAL_COMMUNITY): Payer: Self-pay | Admitting: *Deleted

## 2013-02-18 MED ORDER — HYDRALAZINE HCL 25 MG PO TABS: ORAL_TABLET | ORAL | Status: DC

## 2013-02-25 ENCOUNTER — Ambulatory Visit (INDEPENDENT_AMBULATORY_CARE_PROVIDER_SITE_OTHER): Payer: Medicaid Other | Admitting: *Deleted

## 2013-02-25 DIAGNOSIS — Z7901 Long term (current) use of anticoagulants: Secondary | ICD-10-CM

## 2013-02-25 DIAGNOSIS — I4891 Unspecified atrial fibrillation: Secondary | ICD-10-CM

## 2013-02-25 LAB — POCT INR: INR: 2.5

## 2013-03-17 ENCOUNTER — Other Ambulatory Visit: Payer: Self-pay | Admitting: *Deleted

## 2013-03-17 MED ORDER — WARFARIN SODIUM 3 MG PO TABS: ORAL_TABLET | ORAL | Status: DC

## 2013-04-08 ENCOUNTER — Ambulatory Visit (INDEPENDENT_AMBULATORY_CARE_PROVIDER_SITE_OTHER): Payer: Medicaid Other | Admitting: *Deleted

## 2013-04-08 DIAGNOSIS — I4891 Unspecified atrial fibrillation: Secondary | ICD-10-CM

## 2013-04-08 DIAGNOSIS — Z7901 Long term (current) use of anticoagulants: Secondary | ICD-10-CM

## 2013-05-20 ENCOUNTER — Ambulatory Visit (INDEPENDENT_AMBULATORY_CARE_PROVIDER_SITE_OTHER): Payer: Medicaid Other | Admitting: *Deleted

## 2013-05-20 DIAGNOSIS — Z7901 Long term (current) use of anticoagulants: Secondary | ICD-10-CM

## 2013-05-20 DIAGNOSIS — I4891 Unspecified atrial fibrillation: Secondary | ICD-10-CM

## 2013-06-07 ENCOUNTER — Other Ambulatory Visit (HOSPITAL_COMMUNITY): Payer: Self-pay | Admitting: Adult Health

## 2013-06-07 DIAGNOSIS — I10 Essential (primary) hypertension: Secondary | ICD-10-CM

## 2013-06-10 ENCOUNTER — Other Ambulatory Visit: Payer: Self-pay

## 2013-06-10 MED ORDER — HYDRALAZINE HCL 25 MG PO TABS: ORAL_TABLET | ORAL | Status: DC

## 2013-06-13 ENCOUNTER — Encounter (HOSPITAL_COMMUNITY): Payer: Self-pay | Admitting: Emergency Medicine

## 2013-06-13 ENCOUNTER — Inpatient Hospital Stay (HOSPITAL_COMMUNITY)
Admission: EM | Admit: 2013-06-13 | Discharge: 2013-06-16 | DRG: 378 | Disposition: A | Discharge status: Home / Self Care | Payer: Medicaid Other | Attending: Internal Medicine | Admitting: Internal Medicine

## 2013-06-13 DIAGNOSIS — Z7901 Long term (current) use of anticoagulants: Secondary | ICD-10-CM

## 2013-06-13 DIAGNOSIS — K2980 Duodenitis without bleeding: Secondary | ICD-10-CM | POA: Diagnosis present

## 2013-06-13 DIAGNOSIS — K5733 Diverticulitis of large intestine without perforation or abscess with bleeding: Principal | ICD-10-CM | POA: Diagnosis present

## 2013-06-13 DIAGNOSIS — N1831 Chronic kidney disease, stage 3a: Secondary | ICD-10-CM | POA: Diagnosis present

## 2013-06-13 DIAGNOSIS — I5022 Chronic systolic (congestive) heart failure: Secondary | ICD-10-CM

## 2013-06-13 DIAGNOSIS — I480 Paroxysmal atrial fibrillation: Secondary | ICD-10-CM | POA: Diagnosis present

## 2013-06-13 DIAGNOSIS — I129 Hypertensive chronic kidney disease with stage 1 through stage 4 chronic kidney disease, or unspecified chronic kidney disease: Secondary | ICD-10-CM

## 2013-06-13 DIAGNOSIS — E871 Hypo-osmolality and hyponatremia: Secondary | ICD-10-CM | POA: Diagnosis present

## 2013-06-13 DIAGNOSIS — N183 Chronic kidney disease, stage 3 unspecified: Secondary | ICD-10-CM

## 2013-06-13 DIAGNOSIS — I10 Essential (primary) hypertension: Secondary | ICD-10-CM

## 2013-06-13 DIAGNOSIS — R739 Hyperglycemia, unspecified: Secondary | ICD-10-CM

## 2013-06-13 DIAGNOSIS — K269 Duodenal ulcer, unspecified as acute or chronic, without hemorrhage or perforation: Secondary | ICD-10-CM | POA: Diagnosis present

## 2013-06-13 DIAGNOSIS — E875 Hyperkalemia: Secondary | ICD-10-CM

## 2013-06-13 DIAGNOSIS — S88119A Complete traumatic amputation at level between knee and ankle, unspecified lower leg, initial encounter: Secondary | ICD-10-CM

## 2013-06-13 DIAGNOSIS — E119 Type 2 diabetes mellitus without complications: Secondary | ICD-10-CM

## 2013-06-13 DIAGNOSIS — K2960 Other gastritis without bleeding: Secondary | ICD-10-CM | POA: Diagnosis present

## 2013-06-13 DIAGNOSIS — Z87891 Personal history of nicotine dependence: Secondary | ICD-10-CM

## 2013-06-13 DIAGNOSIS — I4891 Unspecified atrial fibrillation: Secondary | ICD-10-CM

## 2013-06-13 DIAGNOSIS — Z23 Encounter for immunization: Secondary | ICD-10-CM

## 2013-06-13 DIAGNOSIS — Z794 Long term (current) use of insulin: Secondary | ICD-10-CM

## 2013-06-13 DIAGNOSIS — F1210 Cannabis abuse, uncomplicated: Secondary | ICD-10-CM | POA: Diagnosis present

## 2013-06-13 DIAGNOSIS — Z6838 Body mass index (BMI) 38.0-38.9, adult: Secondary | ICD-10-CM

## 2013-06-13 DIAGNOSIS — K922 Gastrointestinal hemorrhage, unspecified: Secondary | ICD-10-CM

## 2013-06-13 LAB — CBC
HCT: 35.9 % — ABNORMAL LOW (ref 39.0–52.0)
HCT: 31.8 % — ABNORMAL LOW (ref 39.0–52.0)
HCT: 30.2 % — ABNORMAL LOW (ref 39.0–52.0)
HCT: 24.3 % — ABNORMAL LOW (ref 39.0–52.0)
Hemoglobin: 12.1 g/dL — ABNORMAL LOW (ref 13.0–17.0)
Hemoglobin: 10.8 g/dL — ABNORMAL LOW (ref 13.0–17.0)
Hemoglobin: 10.1 g/dL — ABNORMAL LOW (ref 13.0–17.0)
Hemoglobin: 9.9 g/dL — ABNORMAL LOW (ref 13.0–17.0)
MCH: 30.1 pg (ref 26.0–34.0)
MCH: 30.2 pg (ref 26.0–34.0)
MCH: 29.9 pg (ref 26.0–34.0)
MCH: 30.1 pg (ref 26.0–34.0)
MCH: 30.3 pg (ref 26.0–34.0)
MCHC: 33.7 g/dL (ref 30.0–36.0)
MCHC: 34 g/dL (ref 30.0–36.0)
MCHC: 33.6 g/dL (ref 30.0–36.0)
MCHC: 34.2 g/dL (ref 30.0–36.0)
MCV: 88.8 fL (ref 78.0–100.0)
MCV: 89.3 fL (ref 78.0–100.0)
MCV: 89.7 fL (ref 78.0–100.0)
MCV: 88.7 fL (ref 78.0–100.0)
Platelets: 292 10*3/uL (ref 150–400)
Platelets: 247 10*3/uL (ref 150–400)
RBC: 4.02 MIL/uL — ABNORMAL LOW (ref 4.22–5.81)
RBC: 3.58 MIL/uL — ABNORMAL LOW (ref 4.22–5.81)
RBC: 3.29 MIL/uL — ABNORMAL LOW (ref 4.22–5.81)
RDW: 13.3 % (ref 11.5–15.5)
RDW: 13.5 % (ref 11.5–15.5)
RDW: 13.6 % (ref 11.5–15.5)
RDW: 13.6 % (ref 11.5–15.5)
WBC: 16.2 10*3/uL — ABNORMAL HIGH (ref 4.0–10.5)
WBC: 16.6 10*3/uL — ABNORMAL HIGH (ref 4.0–10.5)
WBC: 13.4 10*3/uL — ABNORMAL HIGH (ref 4.0–10.5)

## 2013-06-13 LAB — PROTIME-INR
INR: 2.93 — ABNORMAL HIGH (ref 0.00–1.49)
INR: 2.95 — ABNORMAL HIGH (ref 0.00–1.49)
Prothrombin Time: 29.5 seconds — ABNORMAL HIGH (ref 11.6–15.2)
Prothrombin Time: 29.7 seconds — ABNORMAL HIGH (ref 11.6–15.2)

## 2013-06-13 LAB — BASIC METABOLIC PANEL
BUN: 41 mg/dL — ABNORMAL HIGH (ref 6–23)
CO2: 18 mEq/L — ABNORMAL LOW (ref 19–32)
Calcium: 7.7 mg/dL — ABNORMAL LOW (ref 8.4–10.5)
Calcium: 7.7 mg/dL — ABNORMAL LOW (ref 8.4–10.5)
Chloride: 97 mEq/L (ref 96–112)
Chloride: 104 mEq/L (ref 96–112)
Creatinine, Ser: 1.59 mg/dL — ABNORMAL HIGH (ref 0.50–1.35)
GFR calc Af Amer: 52 mL/min — ABNORMAL LOW (ref >90)
GFR calc non Af Amer: 45 mL/min — ABNORMAL LOW (ref >90)
Glucose, Bld: 420 mg/dL — ABNORMAL HIGH (ref 70–99)
Sodium: 127 mEq/L — ABNORMAL LOW (ref 135–145)
Sodium: 134 mEq/L — ABNORMAL LOW (ref 135–145)

## 2013-06-13 LAB — COMPREHENSIVE METABOLIC PANEL
ALT: 16 U/L (ref 0–53)
Alkaline Phosphatase: 53 U/L (ref 39–117)
BUN: 40 mg/dL — ABNORMAL HIGH (ref 6–23)
CO2: 23 mEq/L (ref 19–32)
GFR calc Af Amer: 50 mL/min — ABNORMAL LOW (ref >90)
GFR calc non Af Amer: 43 mL/min — ABNORMAL LOW (ref >90)
Glucose, Bld: 363 mg/dL — ABNORMAL HIGH (ref 70–99)
Potassium: 5.7 mEq/L — ABNORMAL HIGH (ref 3.5–5.1)
Sodium: 127 mEq/L — ABNORMAL LOW (ref 135–145)

## 2013-06-13 LAB — GLUCOSE, CAPILLARY
Glucose-Capillary: 275 mg/dL — ABNORMAL HIGH (ref 70–99)
Glucose-Capillary: 378 mg/dL — ABNORMAL HIGH (ref 70–99)
Glucose-Capillary: 335 mg/dL — ABNORMAL HIGH (ref 70–99)

## 2013-06-13 LAB — TROPONIN I: Troponin I: <0.3 ng/mL (ref <0.30)

## 2013-06-13 MED ORDER — ISOSORBIDE MONONITRATE ER 60 MG PO TB24
60.0000 mg | ORAL_TABLET | Freq: Every day | ORAL | Status: DC
  Administered 2013-06-13 – 2013-06-16 (×4): 60 mg via ORAL
  Filled 2013-06-13 (×4): qty 1

## 2013-06-13 MED ORDER — PANTOPRAZOLE SODIUM 40 MG IV SOLR
40.0000 mg | INTRAVENOUS | Status: DC
  Administered 2013-06-13 – 2013-06-16 (×4): 40 mg via INTRAVENOUS
  Filled 2013-06-13 (×4): qty 40

## 2013-06-13 MED ORDER — SODIUM CHLORIDE 0.9 % IV BOLUS (SEPSIS)
1000.0000 mL | Freq: Once | INTRAVENOUS | Status: AC
  Administered 2013-06-13: 1000 mL via INTRAVENOUS

## 2013-06-13 MED ORDER — PANTOPRAZOLE SODIUM 40 MG IV SOLR
40.0000 mg | Freq: Two times a day (BID) | INTRAVENOUS | Status: DC
  Filled 2013-06-13 (×2): qty 40

## 2013-06-13 MED ORDER — INSULIN ASPART 100 UNIT/ML ~~LOC~~ SOLN
0.0000 [IU] | SUBCUTANEOUS | Status: DC
  Administered 2013-06-14: 5 [IU] via SUBCUTANEOUS
  Administered 2013-06-14 – 2013-06-15 (×2): 3 [IU] via SUBCUTANEOUS
  Administered 2013-06-15: 5 [IU] via SUBCUTANEOUS
  Administered 2013-06-15: 2 [IU] via SUBCUTANEOUS
  Administered 2013-06-16 (×2): 5 [IU] via SUBCUTANEOUS

## 2013-06-13 MED ORDER — INSULIN ASPART 100 UNIT/ML ~~LOC~~ SOLN
0.0000 [IU] | Freq: Three times a day (TID) | SUBCUTANEOUS | Status: DC
  Administered 2013-06-13: 9 [IU] via SUBCUTANEOUS

## 2013-06-13 MED ORDER — INSULIN GLARGINE 100 UNIT/ML ~~LOC~~ SOLN
8.0000 [IU] | Freq: Every day | SUBCUTANEOUS | Status: DC
  Administered 2013-06-13 – 2013-06-15 (×3): 8 [IU] via SUBCUTANEOUS
  Filled 2013-06-13 (×4): qty 0.08

## 2013-06-13 MED ORDER — SODIUM POLYSTYRENE SULFONATE 15 GM/60ML PO SUSP
15.0000 g | Freq: Once | ORAL | Status: DC
  Filled 2013-06-13: qty 60

## 2013-06-13 MED ORDER — CARVEDILOL 25 MG PO TABS
25.0000 mg | ORAL_TABLET | Freq: Two times a day (BID) | ORAL | Status: DC
  Administered 2013-06-13 – 2013-06-16 (×6): 25 mg via ORAL
  Filled 2013-06-13 (×9): qty 1

## 2013-06-13 MED ORDER — SODIUM CHLORIDE 0.9 % IJ SOLN
3.0000 mL | Freq: Two times a day (BID) | INTRAMUSCULAR | Status: DC
  Administered 2013-06-13 – 2013-06-16 (×5): 3 mL via INTRAVENOUS

## 2013-06-13 MED ORDER — INSULIN ASPART 100 UNIT/ML IV SOLN
10.0000 [IU] | Freq: Once | INTRAVENOUS | Status: AC
  Administered 2013-06-13: 10 [IU] via INTRAVENOUS

## 2013-06-13 MED ORDER — SODIUM CHLORIDE 0.9 % IV SOLN
INTRAVENOUS | Status: DC
  Administered 2013-06-14: 500 mL via INTRAVENOUS
  Administered 2013-06-14: 01:00:00 via INTRAVENOUS

## 2013-06-13 MED ORDER — INFLUENZA VAC SPLIT QUAD 0.5 ML IM SUSP
0.5000 mL | INTRAMUSCULAR | Status: AC
  Administered 2013-06-14: 0.5 mL via INTRAMUSCULAR
  Filled 2013-06-13: qty 0.5

## 2013-06-13 NOTE — ED Provider Notes (Signed)
CSN: [id number]     Arrival date & time 06/13/13  4098 History   First MD Initiated Contact with Patient 06/13/13 0545     Chief Complaint  Patient presents with  . Abdominal Pain  . Rectal Bleeding   patient denies having abdominal pain to ED MD  PCP in [place] [facility] Bensimhon Goodell  (Consider location/radiation/quality/duration/timing/severity/associated sxs/prior Treatment) HPI This 57 year old male has 2 episodes this morning just prior to arrival of bright red blood per rectum, painless, one large spelled one small spell, he denies ever having any abdominal pain denies lightheadedness denies chest pain denies shortness of breath, he does take chronic Coumadin for atrial fibrillation, he has no syncope no trauma no history of GI bleeds except for a self-limited spell over 20 years ago when he never saw a doctor for it, there is no treatment prior to arrival. Past Medical History  Diagnosis Date  . Atrial fibrillation   . CHF (congestive heart failure)     chronic systolic CHF  . Diabetes mellitus     type 2  . Hypertension   . Chronic renal failure   . Amputation of leg    Past Surgical History  Procedure Laterality Date  . 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 Esophagogastroduodenoscopy N/A 06/14/2013    Procedure: ESOPHAGOGASTRODUODENOSCOPY (EGD);  Surgeon: Shirley Friar, MD;  Location: [facility] ENDOSCOPY;  Service: Endoscopy;  Laterality: N/A;  . Colonoscopy N/A 06/15/2013    Procedure: COLONOSCOPY;  Surgeon: Shirley Friar, MD;  Location: [facility] ENDOSCOPY;  Service: Endoscopy;  Laterality: N/A;   Family History  Problem Relation Age of Onset  . Diabetes Other    History  Substance Use Topics  . Smoking status: Former Games developer  . Smokeless tobacco: Not on file  . Alcohol Use: No    Review of Systems 10 Systems reviewed and are negative for acute change except as noted in the HPI. Allergies  Review of patient's allergies indicates no known  allergies.  Home Medications   Current Outpatient Rx  Name  Route  Sig  Dispense  Refill  . carvedilol (COREG) 25 MG tablet   Oral   Take 25 mg by mouth 2 (two) times daily with a meal.         . insulin glargine (LANTUS) 100 UNIT/ML injection   Subcutaneous   Inject 10 Units into the skin at bedtime.         . isosorbide mononitrate (IMDUR) 60 MG 24 hr tablet   Oral   Take 60 mg by mouth daily.         Marland Kitchen warfarin (COUMADIN) 3 MG tablet      TAKE AS DIRECTED BY COUMADIN CLINIC   50 tablet   3     Dispense as written.    50 tablets is 30 day supply   . docusate sodium (COLACE) 100 MG capsule   Oral   Take 1 capsule (100 mg total) by mouth 2 (two) times daily as needed for constipation.   60 capsule   3   . ferrous sulfate 325 (65 FE) MG tablet   Oral   Take 1 tablet (325 mg total) by mouth daily with breakfast.   30 tablet   5   . pantoprazole (PROTONIX) 40 MG tablet   Oral   Take 1 tablet (40 mg total) by mouth daily.   30 tablet   5    BP 124/69  Pulse 80  Temp(Src) 97.2 F (36.2 C) (Oral)  Resp 20  Ht 6\' 1"  (1.854 m)  Wt 291 lb 3.2 oz (132.087 kg)  BMI 38.43 kg/m2  SpO2 100% Physical Exam  Nursing note and vitals reviewed. Constitutional:  Awake, alert, nontoxic appearance.  HENT:  Head: Atraumatic.  Eyes: Right eye exhibits no discharge. Left eye exhibits no discharge.  Neck: Neck supple.  Cardiovascular:  No murmur heard. Irregularly irregular rate and rhythm  Pulmonary/Chest: Effort normal and breath sounds normal. No respiratory distress. He has no wheezes. He has no rales. He exhibits no tenderness.  Abdominal: Soft. Bowel sounds are normal. He exhibits no distension and no mass. There is no tenderness. There is no rebound and no guarding.  Genitourinary:  Rectal examination nontender but reveals bright red blood small amount on examination glove  Musculoskeletal: He exhibits no tenderness.  Baseline ROM, no obvious new focal  weakness.  Neurological:  Mental status and motor strength appears baseline for patient and situation.  Skin: No rash noted.  Psychiatric: He has a normal mood and affect.    ED Course  Procedures (including critical care time) Patient / Family / Caregiver understand and agree with initial ED impression and plan with expectations set for ED visit.Pt stable in ED with no significant deterioration in condition.D/w OPC who will see Pt in ED for Obs/admit. 0735 Labs Review Labs Reviewed  PROTIME-INR - Abnormal; Notable for the following:    Prothrombin Time 29.5 (*)    INR 2.93 (*)    All other components within normal limits  CBC - Abnormal; Notable for the following:    WBC 19.1 (*)    RBC 4.02 (*)    Hemoglobin 12.1 (*)    HCT 35.9 (*)    All other components within normal limits  COMPREHENSIVE METABOLIC PANEL - Abnormal; Notable for the following:    Sodium 127 (*)    Potassium 5.7 (*)    Chloride 94 (*)    Glucose, Bld 363 (*)    BUN 40 (*)    Creatinine, Ser 1.68 (*)    Calcium 8.3 (*)    Albumin 3.2 (*)    Total Bilirubin 0.2 (*)    GFR calc non Af Amer 43 (*)    GFR calc Af Amer 50 (*)    All other components within normal limits  CBC - Abnormal; Notable for the following:    WBC 16.2 (*)    RBC 3.58 (*)    Hemoglobin 10.8 (*)    HCT 31.8 (*)    All other components within normal limits  GLUCOSE, CAPILLARY - Abnormal; Notable for the following:    Glucose-Capillary 275 (*)    All other components within normal limits  CBC - Abnormal; Notable for the following:    WBC 16.6 (*)    RBC 3.38 (*)    Hemoglobin 10.1 (*)    HCT 30.2 (*)    All other components within normal limits  CBC - Abnormal; Notable for the following:    WBC 14.0 (*)    RBC 3.29 (*)    Hemoglobin 9.9 (*)    HCT 29.5 (*)    All other components within normal limits  CBC - Abnormal; Notable for the following:    WBC 13.4 (*)    RBC 2.74 (*)    Hemoglobin 8.3 (*)    HCT 24.3 (*)    All  other components within normal limits  OSMOLALITY - Abnormal; Notable for the following:  Osmolality 305 (*)    All other components within normal limits  BASIC METABOLIC PANEL - Abnormal; Notable for the following:    Sodium 127 (*)    Potassium 5.8 (*)    CO2 18 (*)    Glucose, Bld 420 (*)    BUN 41 (*)    Creatinine, Ser 1.59 (*)    Calcium 7.7 (*)    GFR calc non Af Amer 46 (*)    GFR calc Af Amer 54 (*)    All other components within normal limits  PROTIME-INR - Abnormal; Notable for the following:    Prothrombin Time 29.7 (*)    INR 2.95 (*)    All other components within normal limits  GLUCOSE, CAPILLARY - Abnormal; Notable for the following:    Glucose-Capillary 378 (*)    All other components within normal limits  BASIC METABOLIC PANEL - Abnormal; Notable for the following:    Sodium 134 (*)    Glucose, Bld 250 (*)    BUN 39 (*)    Creatinine, Ser 1.64 (*)    Calcium 7.7 (*)    GFR calc non Af Amer 45 (*)    GFR calc Af Amer 52 (*)    All other components within normal limits  BASIC METABOLIC PANEL - Abnormal; Notable for the following:    Sodium 133 (*)    Glucose, Bld 240 (*)    BUN 35 (*)    Creatinine, Ser 1.59 (*)    Calcium 7.8 (*)    GFR calc non Af Amer 46 (*)    GFR calc Af Amer 54 (*)    All other components within normal limits  CBC - Abnormal; Notable for the following:    RBC 2.53 (*)    Hemoglobin 7.5 (*)    HCT 22.5 (*)    All other components within normal limits  PROTIME-INR - Abnormal; Notable for the following:    Prothrombin Time 20.8 (*)    INR 1.85 (*)    All other components within normal limits  HEMOGLOBIN A1C - Abnormal; Notable for the following:    Hemoglobin A1C 11.3 (*)    Mean Plasma Glucose 278 (*)    All other components within normal limits  GLUCOSE, CAPILLARY - Abnormal; Notable for the following:    Glucose-Capillary 335 (*)    All other components within normal limits  CBC - Abnormal; Notable for the following:     RBC 3.13 (*)    Hemoglobin 9.4 (*)    HCT 27.7 (*)    All other components within normal limits  GLUCOSE, CAPILLARY - Abnormal; Notable for the following:    Glucose-Capillary 238 (*)    All other components within normal limits  GLUCOSE, CAPILLARY - Abnormal; Notable for the following:    Glucose-Capillary 239 (*)    All other components within normal limits  GLUCOSE, CAPILLARY - Abnormal; Notable for the following:    Glucose-Capillary 225 (*)    All other components within normal limits  GLUCOSE, CAPILLARY - Abnormal; Notable for the following:    Glucose-Capillary 225 (*)    All other components within normal limits  GLUCOSE, CAPILLARY - Abnormal; Notable for the following:    Glucose-Capillary 212 (*)    All other components within normal limits  CBC - Abnormal; Notable for the following:    RBC 2.98 (*)    Hemoglobin 8.9 (*)    HCT 26.4 (*)    All other components within normal  limits  GLUCOSE, CAPILLARY - Abnormal; Notable for the following:    Glucose-Capillary 200 (*)    All other components within normal limits  BASIC METABOLIC PANEL - Abnormal; Notable for the following:    Glucose, Bld 200 (*)    Creatinine, Ser 1.55 (*)    Calcium 7.7 (*)    GFR calc non Af Amer 48 (*)    GFR calc Af Amer 55 (*)    All other components within normal limits  GLUCOSE, CAPILLARY - Abnormal; Notable for the following:    Glucose-Capillary 214 (*)    All other components within normal limits  CBC - Abnormal; Notable for the following:    RBC 3.06 (*)    Hemoglobin 9.1 (*)    HCT 27.2 (*)    All other components within normal limits  CBC - Abnormal; Notable for the following:    RBC 2.80 (*)    Hemoglobin 8.4 (*)    HCT 24.7 (*)    All other components within normal limits  CBC - Abnormal; Notable for the following:    RBC 3.06 (*)    Hemoglobin 8.9 (*)    HCT 27.3 (*)    All other components within normal limits  GLUCOSE, CAPILLARY - Abnormal; Notable for the following:     Glucose-Capillary 172 (*)    All other components within normal limits  GLUCOSE, CAPILLARY - Abnormal; Notable for the following:    Glucose-Capillary 183 (*)    All other components within normal limits  GLUCOSE, CAPILLARY - Abnormal; Notable for the following:    Glucose-Capillary 204 (*)    All other components within normal limits  CBC - Abnormal; Notable for the following:    RBC 2.91 (*)    Hemoglobin 8.6 (*)    HCT 26.2 (*)    All other components within normal limits  GLUCOSE, CAPILLARY - Abnormal; Notable for the following:    Glucose-Capillary 218 (*)    All other components within normal limits  CBC - Abnormal; Notable for the following:    RBC 2.86 (*)    Hemoglobin 8.5 (*)    HCT 25.7 (*)    All other components within normal limits  GLUCOSE, CAPILLARY - Abnormal; Notable for the following:    Glucose-Capillary 229 (*)    All other components within normal limits  GLUCOSE, CAPILLARY - Abnormal; Notable for the following:    Glucose-Capillary 215 (*)    All other components within normal limits  OCCULT BLOOD, POC DEVICE - Abnormal; Notable for the following:    Fecal Occult Bld POSITIVE (*)    All other components within normal limits  TROPONIN I  OSMOLALITY, URINE  TYPE AND SCREEN  PREPARE FRESH FROZEN PLASMA  PREPARE RBC (CROSSMATCH)   Imaging Review No results found.  EKG Interpretation     Ventricular Rate:  88 PR Interval:  186 QRS Duration: 94 QT Interval:  368 QTC Calculation: 446 R Axis:   -25 Text Interpretation:  Sinus rhythm Borderline left axis deviation Low voltage, precordial leads Baseline wander in lead(s) III No significant change since last tracing            MDM   1. Lower GI bleed   2. Hyperglycemia   3. Hyperkalemia   4. Warfarin-induced coagulopathy, initial encounter   5. Atrial fibrillation   6. Chronic kidney disease, stage III (moderate)   7. Chronic systolic heart failure   8. GIB (gastrointestinal bleeding)    9. Type  II or unspecified type diabetes mellitus without mention of complication, not stated as uncontrolled   10. Chronic anticoagulation   11. Morbid obesity   12. Unspecified essential hypertension    The patient appears reasonably stabilized for admission considering the current resources, flow, and capabilities available in the ED at this time, and I doubt any other [facility] requiring further screening and/or treatment in the ED prior to admission.    Hurman Horn, MD 06/29/13 2111

## 2013-06-13 NOTE — Consult Note (Signed)
Reason for Consult: GI bleed Referring Physician: Hospital team  Brian Martin is an 57 y.o. male.  HPI: Patient on Coumadin and aspirin with some GI bleeding both dark and bright red beginning last night which woke him up and he did have a bleeding episode about 8 years ago which was mostly dark but he did not seek medical attention and was on a higher dose of aspirin and nonsteroidals back then but has not had any previous GI workup and nothing runs in his family from a GI standpoint but he has had a little bit of increased upper tract symptoms but no real change in bowel habits or abdominal pain and his case was discussed with the hospital team as well Past Medical History  Diagnosis Date  . Atrial fibrillation   . CHF (congestive heart failure)     chronic systolic CHF  . Diabetes mellitus     type 2  . Hypertension   . Chronic renal failure   . Amputation of leg     Past Surgical History  Procedure Laterality Date  . 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 Diabetes Other     Social History:  reports that he has quit smoking. He does not have any smokeless tobacco history on file. He reports that he uses illicit drugs (Marijuana). He reports that he does not drink alcohol.  Allergies: No Known Allergies  Medications: I have reviewed the patient's current medications.  Results for orders placed during the hospital encounter of 06/13/13 (from the past 48 hour(s))  PROTIME-INR     Status: Abnormal   Collection Time    06/13/13  5:45 AM      Result Value Range   Prothrombin Time 29.5 (*) 11.6 - 15.2 seconds   INR 2.93 (*) 0.00 - 1.49  CBC     Status: Abnormal   Collection Time    06/13/13  5:45 AM      Result Value Range   WBC 19.1 (*) 4.0 - 10.5 K/uL   RBC 4.02 (*) 4.22 - 5.81 MIL/uL   Hemoglobin 12.1 (*) 13.0 - 17.0 g/dL   HCT 29.5 (*) 62.1 - 30.8 %   MCV 89.3  78.0 - 100.0 fL   MCH 30.1  26.0 - 34.0 pg    MCHC 33.7  30.0 - 36.0 g/dL   RDW 65.7  84.6 - 96.2 %   Platelets 313  150 - 400 K/uL  COMPREHENSIVE METABOLIC PANEL     Status: Abnormal   Collection Time    06/13/13  5:45 AM      Result Value Range   Sodium 127 (*) 135 - 145 mEq/L   Potassium 5.7 (*) 3.5 - 5.1 mEq/L   Chloride 94 (*) 96 - 112 mEq/L   CO2 23  19 - 32 mEq/L   Glucose, Bld 363 (*) 70 - 99 mg/dL   BUN 40 (*) 6 - 23 mg/dL   Creatinine, Ser 9.52 (*) 0.50 - 1.35 mg/dL   Calcium 8.3 (*) 8.4 - 10.5 mg/dL   Total Protein 7.2  6.0 - 8.3 g/dL   Albumin 3.2 (*) 3.5 - 5.2 g/dL   AST 15  0 - 37 U/L   ALT 16  0 - 53 U/L   Alkaline Phosphatase 53  39 - 117 U/L   Total Bilirubin 0.2 (*) 0.3 - 1.2 mg/dL   GFR  calc non Af Amer 43 (*) >90 mL/min   GFR calc Af Amer 50 (*) >90 mL/min   Comment: (NOTE)     The eGFR has been calculated using the CKD EPI equation.     This calculation has not been validated in all clinical situations.     eGFR's persistently <90 mL/min signify possible Chronic Kidney     Disease.  OCCULT BLOOD, POC DEVICE     Status: Abnormal   Collection Time    06/13/13  5:59 AM      Result Value Range   Fecal Occult Bld POSITIVE (*) NEGATIVE  CBC     Status: Abnormal   Collection Time    06/13/13  9:50 AM      Result Value Range   WBC 16.2 (*) 4.0 - 10.5 K/uL   RBC 3.58 (*) 4.22 - 5.81 MIL/uL   Hemoglobin 10.8 (*) 13.0 - 17.0 g/dL   HCT 16.1 (*) 09.6 - 04.5 %   MCV 88.8  78.0 - 100.0 fL   MCH 30.2  26.0 - 34.0 pg   MCHC 34.0  30.0 - 36.0 g/dL   RDW 40.9  81.1 - 91.4 %   Platelets 292  150 - 400 K/uL  TYPE AND SCREEN     Status: None   Collection Time    06/13/13  9:50 AM      Result Value Range   ABO/RH(D) O POS     Antibody Screen NEG     Sample Expiration 06/16/2013    GLUCOSE, CAPILLARY     Status: Abnormal   Collection Time    06/13/13 10:37 AM      Result Value Range   Glucose-Capillary 275 (*) 70 - 99 mg/dL  PREPARE FRESH FROZEN PLASMA     Status: None   Collection Time    06/13/13  10:52 AM      Result Value Range   Unit Number [id number]     Blood Component Type THAWED PLASMA     Unit division 00     Status of Unit ALLOCATED     Transfusion Status OK TO TRANSFUSE     Unit Number [id number]     Blood Component Type THAWED PLASMA     Unit division 00     Status of Unit ISSUED     Transfusion Status OK TO TRANSFUSE    CBC     Status: Abnormal   Collection Time    06/13/13  1:00 PM      Result Value Range   WBC 16.6 (*) 4.0 - 10.5 K/uL   RBC 3.38 (*) 4.22 - 5.81 MIL/uL   Hemoglobin 10.1 (*) 13.0 - 17.0 g/dL   HCT 32.4 (*) 40.1 - 02.7 %   MCV 89.3  78.0 - 100.0 fL   MCH 29.9  26.0 - 34.0 pg   MCHC 33.4  30.0 - 36.0 g/dL   RDW 25.3  66.4 - 40.3 %   Platelets 317  150 - 400 K/uL  TROPONIN I     Status: None   Collection Time    06/13/13  1:00 PM      Result Value Range   Troponin I <0.30  <0.30 ng/mL   Comment:            Due to the release kinetics of cTnI,     a negative result within the first hours     of the onset of symptoms does not rule out  myocardial infarction with certainty.     If myocardial infarction is still suspected,     repeat the test at appropriate intervals.  BASIC METABOLIC PANEL     Status: Abnormal   Collection Time    06/13/13  1:00 PM      Result Value Range   Sodium 127 (*) 135 - 145 mEq/L   Potassium 5.8 (*) 3.5 - 5.1 mEq/L   Chloride 97  96 - 112 mEq/L   CO2 18 (*) 19 - 32 mEq/L   Glucose, Bld 420 (*) 70 - 99 mg/dL   BUN 41 (*) 6 - 23 mg/dL   Creatinine, Ser 1.61 (*) 0.50 - 1.35 mg/dL   Calcium 7.7 (*) 8.4 - 10.5 mg/dL   GFR calc non Af Amer 46 (*) >90 mL/min   GFR calc Af Amer 54 (*) >90 mL/min   Comment: (NOTE)     The eGFR has been calculated using the CKD EPI equation.     This calculation has not been validated in all clinical situations.     eGFR's persistently <90 mL/min signify possible Chronic Kidney     Disease.  PROTIME-INR     Status: Abnormal   Collection Time    06/13/13  1:00 PM       Result Value Range   Prothrombin Time 29.7 (*) 11.6 - 15.2 seconds   INR 2.95 (*) 0.00 - 1.49    No results found.  ROS negative except above Blood pressure 121/74, pulse 87, temperature 98.2 F (36.8 C), temperature source Oral, resp. rate 18, height 6\' 1"  (1.854 m), weight 136.079 kg (300 lb), SpO2 100.00%. Physical Exam Vital signs stable afebrile no acute distress abdomen is soft nontender labs reviewed Assessment/Plan: GI bleeding in a patient on aspirin and Coumadin Plan: Agree with reversing Coumadin with FFP and we discussed the risks benefits and methods of both endoscopy and colonoscopy and we will proceed with an endoscopy tomorrow morning and pending those findings probably a colonoscopy on Sunday and call us sooner when necessary otherwise agree with pump inhibitors and will allow clear liquids today  Aceson Labell E 06/13/2013, 3:01 PM

## 2013-06-13 NOTE — ED Notes (Signed)
Checked patient blood sugar it was 275 notified RN Erika of blood sugar

## 2013-06-13 NOTE — ED Provider Notes (Signed)
8:43 AM Called to bedside by nursing because continued bloody stool. Maroon stool passed. Additional IV established. Additional IVF. Pt with no new complaints laying in bed but did endorse lightheadedness when he was up in wheelchair to go to bathroom. Pt admitted to hospital. Therapeutic INR noted. Given currently HD stable will defer additional therapy to admitting providers. T&S and repeat CBC.   Raeford Razor, MD 06/13/13 [phone]

## 2013-06-13 NOTE — H&P (Signed)
Hospital Admission Note Date: 06/13/2013  Patient name: Brian Martin Medical record number: [id number] Date of birth: 1956-03-14 Age: 57 y.o. Gender: male PCP: Abner Greenspan, MD  Medical Service: IMTS   Attending physician: Dr. Kem Kays   Internal Medicine Teaching Service Contact Information  Weekday Hours (7AM-5PM):   1st Contact: Dr. Harmon Dun   [phone] 2nd Contact: Dr. Virgina Organ,  Pager; [phone]  ** If no return call within 15 minutes (after trying both pagers listed above), please call after hours pagers.   After 5 pm or weekends: 1st Contact: Pager: [phone] 2nd Contact: Pager: [phone]  Chief Complaint: rectal bleeding.  History of Present Illness:  Patient is a 57 year old man with a past medical history of atrial fibrillation on Coumadin, CKD-III, CHF (EF 45-50% on 08/13/12),  diabetes type 2, hypertension and Left BKA, who presents with rectal bleeding.  Patient reports that he has been doing fine until early morning today. At about 2 AM, patient felt urge to go to the bathroom. He went to the bathroom and had a larger volume of bright red colored stool. About 45 minutes later, he had another smaller volume of bloody stool. He reports that he did not have hx of GI problems, no history of gastric ulcer. He did not have a colonoscopy in the past. He does not have any abdominal discomfort or abdominal pain. He does not have shortness of breath or chest pain, but does have mild lightheadedness.   Of note, patient is taking Coumadin for A fib and is also on aspirin. His INR is 2.93 today. Patient is hemodynamically stable when I saw patient in ED. His blood pressure is 107/65/mmHg. Heart rate is 85/minute. Oxygen saturation 99% on room air. Patient had another episode of bloody stool, which is dark colored per Nurse in Ed.   ROS:  Denies fever, chills, headaches, cough, chest pain, SOB, abdominal pain, constipation, dysuria, urgency, frequency, hematuria.  Meds: No  current outpatient prescriptions on file.  Allergies: Allergies as of 06/13/2013  . (No Known Allergies)   Past Medical History  Diagnosis Date  . Atrial fibrillation   . CHF (congestive heart failure)     chronic systolic CHF  . Diabetes mellitus     type 2  . Hypertension   . Chronic renal failure   . Amputation of leg    Past Surgical History  Procedure Laterality Date  . Below knee leg amputation  06/29/2010    Left    Family History  Problem Relation Age of Onset  . Diabetes Other    History   Social History  . Marital Status: Married    Spouse Name: N/A    Number of Children: N/A  . Years of Education: N/A   Occupational History  . Not on file.   Social History Main Topics  . Smoking status: Former Games developer  . Smokeless tobacco: Not on file  . Alcohol Use: No  . Drug Use: Yes    Special: Marijuana     Comment: no marijuana in the past month  . Sexual Activity:    Other Topics Concern  . Not on file   Social History Narrative  . No narrative on file    Review of Systems: Full 14-point review of systems otherwise negative except as noted above in HPI.  Physical Exam:   Filed Vitals:   06/13/13 1330 06/13/13 1345 06/13/13 1445 06/13/13 1545  BP: 105/71 121/74 115/74 118/73  Pulse: 86 87 90 85  Temp: 98.4 F (  36.9 C) 98.2 F (36.8 C) 98.7 F (37.1 C) 98.2 F (36.8 C)  TempSrc: Oral Oral Oral Oral  Resp: 18 18 16 16   Height:      Weight:      SpO2: 100% 100% 98% 100%    General: Not in acute distress. Pale HEENT: PERRL, EOMI, no scleral icterus, No JVD or bruit Cardiac: regular rate and rhythm (though patient has diagnosis of A fib), No murmurs, gallops or rubs Pulm: Good air movement bilaterally. Clear to auscultation bilaterally. No rales, wheezing, rhonchi or rubs. Abd: Soft, nondistended, nontender, no rebound pain, no organomegaly, BS present Ext: No edema. 2+DP/PT pulse bilaterally. L BKA Musculoskeletal: No joint deformities,  erythema, or stiffness, ROM full Skin: No rashes.  Neuro: Alert and oriented X3, cranial nerves II-XII grossly intact, muscle strength 5/5 in all extremeties, sensation to light touch intact.  Psych: Patient is not psychotic, no suicidal or hemocidal ideation.  Lab results: Basic Metabolic Panel:  Recent Labs  16/10/96 0545 06/13/13 1300  NA 127* 127*  K 5.7* 5.8*  CL 94* 97  CO2 23 18*  GLUCOSE 363* 420*  BUN 40* 41*  CREATININE 1.68* 1.59*  CALCIUM 8.3* 7.7*   Liver Function Tests:  Recent Labs  06/13/13 0545  AST 15  ALT 16  ALKPHOS 53  BILITOT 0.2*  PROT 7.2  ALBUMIN 3.2*   No results found for this basename: LIPASE, AMYLASE,  in the last 72 hours No results found for this basename: AMMONIA,  in the last 72 hours CBC:  Recent Labs  06/13/13 0950 06/13/13 1300  WBC 16.2* 16.6*  HGB 10.8* 10.1*  HCT 31.8* 30.2*  MCV 88.8 89.3  PLT 292 317   Cardiac Enzymes:  Recent Labs  06/13/13 1300  TROPONINI <0.30   BNP: No results found for this basename: PROBNP,  in the last 72 hours D-Dimer: No results found for this basename: DDIMER,  in the last 72 hours CBG:  Recent Labs  06/13/13 1037  GLUCAP 275*   Hemoglobin A1C: No results found for this basename: HGBA1C,  in the last 72 hours Fasting Lipid Panel: No results found for this basename: CHOL, HDL, LDLCALC, TRIG, CHOLHDL, LDLDIRECT,  in the last 72 hours Thyroid Function Tests: No results found for this basename: TSH, T4TOTAL, FREET4, T3FREE, THYROIDAB,  in the last 72 hours Anemia Panel: No results found for this basename: VITAMINB12, FOLATE, FERRITIN, TIBC, IRON, RETICCTPCT,  in the last 72 hours Coagulation:  Recent Labs  06/13/13 0545 06/13/13 1300  LABPROT 29.5* 29.7*  INR 2.93* 2.95*   Urine Drug Screen: Drugs of Abuse     Component Value Date/Time   LABOPIA NEGATIVE 06/28/2010 1915   COCAINSCRNUR NEGATIVE 06/28/2010 1915   LABBENZ NEGATIVE 06/28/2010 1915   AMPHETMU NEGATIVE  06/28/2010 1915    Alcohol Level: No results found for this basename: ETH,  in the last 72 hours Urinalysis: No results found for this basename: COLORURINE, APPERANCEUR, LABSPEC, PHURINE, GLUCOSEU, HGBUR, BILIRUBINUR, KETONESUR, PROTEINUR, UROBILINOGEN, NITRITE, LEUKOCYTESUR,  in the last 72 hours Misc. Labs:  Imaging results:  No results found.  Other results:  EKG: Sinus rhythm, regular, borderline LAD, low voltage, normal R wave progression, normal QT interval, No ischemic change in T waves or ST segments.  Assessment & Plan by Problem:  #: GIB: Patient has painless GI bleeding. The differential diagnoses likely include diverticulosis or AVM. It is less likely due to inflammatary process, such as diverticulitis or ulcerative colitis.. Another possibility is  colon polyp or cancer. Patient's Coumadin and aspirin use may put patient at increased risk for GI bleeding. Patient does not have a history of hepatic problem and his liver function is normal, making esophageal varices bleeding unlikely. His hemoglobin decreased from 14.1 (2 yrs ago)-->12.1-->10.8. Currently patient is hemodynamically stable. He does not have any chest pain or shortness of breath. I discussed with gastroenterologist, Dr. Sharol Roussel, who suggested to reverse INR by 2 U of FFP.   - will admit to tele bed - Appreciate GI consult in managing our patient. Plan to do EGD in AM - Clear liquid diet per Dr. Sharol Roussel - NPO after MN - Give 2 U of FFP - CBC q6h. Hgb goal for transfusion is 8.0 - Hold coumadin, ASA, Lasix, hydralazine and amlodipine - IV protonix 40 mg bid - Type and screen - Trop x 1 - Saline lock for now given hx of CHF. Will resuscitate with IVF if needed.  #: CHF: 2-D echo on 08/13/12 showed EF of 45-50%. Currently patient is taking Lasix at home. Patient does not have leg edema, no JVD. He looks euvolemic now.  -will hold Lasix and aspirin. - Continue home Coreg and isosorbide mononitrate  #: HTN: bp is  107/65 mmHg in Ed.  -will hold hydralazine and amlodipine in the setting of acute GI bleeding.  -Continue home Coreg and isosorbide mononitrate  #: A. Fib: HR is well controlled. HR is 85/min in ED.  -will continue home coreg  #: DM-II: no recent A1c in record. Previous A1c was 11.8 on 06/28/10. Patient is taking Lantus 16 units at home daily -will give half of home dose of lantus (16--> 8 U daily) given possible NPO for procedure. -SSI -check A1c  #: Hyponatremia: Sodium 127. Unclear etiology. May be due to decreased oral intake. -will check serum and urine osmolality -f/u BMP  #: CKD-III: bl cre was 1.7 on 1/232012 on our record. Cre is 1.68 today, likely near his baseline.  -f/u BMP  #: Hyperkalemia: Potassium 5.7 on admission,  then increased to 5.8 on the repeated BMP. EKG has no peaked T wave now. Kayexalate is contraindicated to the acute GI bleeding. I spoke with Dr. Sharol Roussel, who suggested not to use Kayexalate, if K continues to increase above 6.0 with EKG change, may give lasix  -will f/u BMP at 8:00 and 12:00 pm -May treat with Calcium, insulin and dextrose if K continue to increase and develop EKG change.   #  F/E/N  -SL:  -Hyperkalemia:  As above -Diet: clear liquid now-->NPO after MN  # DVT px: SCD   Dispo: Disposition is deferred at this time, awaiting improvement of current medical problems. Anticipated discharge in approximately 2 to 3 day(s).   The patient does have a current PCP (HODGES,BETH, MD), therefore will be requiring OPC follow-up after discharge.   The patient does not have transportation limitations that hinder transportation to clinic appointments.  Signed:  Lorretta Harp, MD PGY3, Internal Medicine Teaching Service Pager: [phone]  06/13/2013, 4:01 PM

## 2013-06-13 NOTE — ED Notes (Signed)
Patient presents to ED via POV. Patient states that he woke up at 2 am with "severe abdominal pain" and had "diarrhea" x2. Pt states that there was "bright red blood" in his stool both times. Pt denies any N/V.  Pt A&Ox4 upon arrival to ED.

## 2013-06-13 NOTE — ED Notes (Addendum)
Pt stated that he felt like he was going to pass out. Large amount of diarrhea/blood. EDP notified. Pt A&O at this time. Stated that he feels good right now while lying.

## 2013-06-14 ENCOUNTER — Encounter (HOSPITAL_COMMUNITY): Payer: Self-pay | Admitting: *Deleted

## 2013-06-14 ENCOUNTER — Other Ambulatory Visit: Payer: Self-pay | Admitting: Gastroenterology

## 2013-06-14 ENCOUNTER — Encounter (HOSPITAL_COMMUNITY)
Admission: EM | Disposition: A | Discharge status: Home / Self Care | Payer: Self-pay | Source: Home / Self Care | Attending: Internal Medicine

## 2013-06-14 DIAGNOSIS — R7309 Other abnormal glucose: Secondary | ICD-10-CM

## 2013-06-14 DIAGNOSIS — Z7901 Long term (current) use of anticoagulants: Secondary | ICD-10-CM

## 2013-06-14 HISTORY — PX: ESOPHAGOGASTRODUODENOSCOPY: SHX5428

## 2013-06-14 LAB — HEMOGLOBIN A1C
Hgb A1c MFr Bld: 11.3 % — ABNORMAL HIGH (ref <5.7)
Mean Plasma Glucose: 278 mg/dL — ABNORMAL HIGH (ref <117)

## 2013-06-14 LAB — CBC
HCT: 22.5 % — ABNORMAL LOW (ref 39.0–52.0)
HCT: 27.7 % — ABNORMAL LOW (ref 39.0–52.0)
Hemoglobin: 7.5 g/dL — ABNORMAL LOW (ref 13.0–17.0)
Hemoglobin: 9.4 g/dL — ABNORMAL LOW (ref 13.0–17.0)
MCH: 30 pg (ref 26.0–34.0)
MCHC: 33.3 g/dL (ref 30.0–36.0)
MCHC: 33.9 g/dL (ref 30.0–36.0)
Platelets: 229 10*3/uL (ref 150–400)
RBC: 2.53 MIL/uL — ABNORMAL LOW (ref 4.22–5.81)
RDW: 14.8 % (ref 11.5–15.5)
WBC: 9.9 10*3/uL (ref 4.0–10.5)

## 2013-06-14 LAB — GLUCOSE, CAPILLARY
Glucose-Capillary: 239 mg/dL — ABNORMAL HIGH (ref 70–99)
Glucose-Capillary: 225 mg/dL — ABNORMAL HIGH (ref 70–99)
Glucose-Capillary: 212 mg/dL — ABNORMAL HIGH (ref 70–99)
Glucose-Capillary: 200 mg/dL — ABNORMAL HIGH (ref 70–99)

## 2013-06-14 LAB — PROTIME-INR
INR: 1.85 — ABNORMAL HIGH (ref 0.00–1.49)
Prothrombin Time: 20.8 seconds — ABNORMAL HIGH (ref 11.6–15.2)

## 2013-06-14 LAB — PREPARE FRESH FROZEN PLASMA: Unit division: 0

## 2013-06-14 LAB — BASIC METABOLIC PANEL
CO2: 21 mEq/L (ref 19–32)
Chloride: 101 mEq/L (ref 96–112)
GFR calc Af Amer: 54 mL/min — ABNORMAL LOW (ref >90)
GFR calc non Af Amer: 46 mL/min — ABNORMAL LOW (ref >90)
Potassium: 4.2 mEq/L (ref 3.5–5.1)
Sodium: 133 mEq/L — ABNORMAL LOW (ref 135–145)

## 2013-06-14 LAB — PREPARE RBC (CROSSMATCH)

## 2013-06-14 SURGERY — EGD (ESOPHAGOGASTRODUODENOSCOPY)
Anesthesia: Moderate Sedation

## 2013-06-14 MED ORDER — FENTANYL CITRATE 0.05 MG/ML IJ SOLN
INTRAMUSCULAR | Status: DC | PRN
  Administered 2013-06-14 (×3): 25 ug via INTRAVENOUS

## 2013-06-14 MED ORDER — DIPHENHYDRAMINE HCL 50 MG/ML IJ SOLN
INTRAMUSCULAR | Status: AC
  Filled 2013-06-14: qty 1

## 2013-06-14 MED ORDER — BUTAMBEN-TETRACAINE-BENZOCAINE 2-2-14 % EX AERO
INHALATION_SPRAY | CUTANEOUS | Status: DC | PRN
  Administered 2013-06-14: 2 via TOPICAL

## 2013-06-14 MED ORDER — PEG 3350-KCL-NA BICARB-NACL 420 G PO SOLR
4000.0000 mL | Freq: Once | ORAL | Status: AC
  Administered 2013-06-14: 4000 mL via ORAL
  Filled 2013-06-14: qty 4000

## 2013-06-14 MED ORDER — MIDAZOLAM HCL 10 MG/2ML IJ SOLN
INTRAMUSCULAR | Status: DC | PRN
  Administered 2013-06-14: 2 mg via INTRAVENOUS
  Administered 2013-06-14: 1 mg via INTRAVENOUS
  Administered 2013-06-14: 2 mg via INTRAVENOUS

## 2013-06-14 MED ORDER — SODIUM CHLORIDE 0.9 % IV SOLN: INTRAVENOUS | Status: DC

## 2013-06-14 MED ORDER — MIDAZOLAM HCL 5 MG/ML IJ SOLN
INTRAMUSCULAR | Status: AC
  Filled 2013-06-14: qty 2

## 2013-06-14 MED ORDER — DIPHENHYDRAMINE HCL 50 MG/ML IJ SOLN
INTRAMUSCULAR | Status: DC | PRN
  Administered 2013-06-14: 25 mg via INTRAVENOUS

## 2013-06-14 MED ORDER — FENTANYL CITRATE 0.05 MG/ML IJ SOLN
INTRAMUSCULAR | Status: AC
  Filled 2013-06-14: qty 2

## 2013-06-14 NOTE — Op Note (Signed)
[facility] [address] [place], [address]   ENDOSCOPY PROCEDURE REPORT  PATIENT: Brian Martin, Brian Martin  MR#: [id number] BIRTHDATE: Jul 06, 1956 , 57  yrs. old GENDER: Male  ENDOSCOPIST: Charlott Rakes, MD REFERRED BM:[id number] team  PROCEDURE DATE:  06/14/2013 PROCEDURE:   EGD, diagnostic ASA CLASS:   Class III INDICATIONS:Hematochezia. MEDICATIONS: Fentanyl 75 mcg IV, Versed 5 mg IV, Diphenhydramine (Benadryl) 25 mg IV, and Cetacaine spray x 2  TOPICAL ANESTHETIC:  DESCRIPTION OF PROCEDURE:   After the risks benefits and alternatives of the procedure were thoroughly explained, informed consent was obtained.  The PENTAX GASTOROSCOPE [id number]  endoscope was introduced through the mouth and advanced to the second portion of the duodenum , limited by Without limitations.   The instrument was slowly withdrawn as the mucosa was fully examined.     FINDINGS: The endoscope was inserted into the oropharynx and esophagus was intubated.  The gastroesophageal junction was noted to be 45 cm from the incisors. The esophagus was normal.  Endoscope was advanced into the stomach, which revealed scattered dark bilious fluid. A small erosion was seen in the antrum. No blood products were noted.  The endoscope was advanced to the duodenal bulb, which was edematous in its distal portion with scattered erythema seen. A small clean-based superficial serpiginous ulcer was seen in the distal duodenal bulb. The second portion of the duodenum was unremarkable.  The endoscope was withdrawn back into the stomach and retroflexion revealed a normal proximal stomach.  COMPLICATIONS: None  ENDOSCOPIC IMPRESSION:     1. Small superficial clean-based duodenal ulcer - doubt related to recent bleeding 2. Mild duodenitis 3. Gastric erosion 4. No source of bleeding seen  RECOMMENDATIONS: Colonoscopy tomorrow; Clears; Supportive care   REPEAT  EXAM: N/A  _______________________________ Charlott Rakes, MD eSigned:  Charlott Rakes, MD 06/14/2013 10:25 AM    CC:  PATIENT NAME:  Brian Martin, Brian Martin MR#: [id number]

## 2013-06-14 NOTE — Progress Notes (Signed)
Subjective: Patient is currently resting comfortably in bed.  Says he feels weak/tired after his procedure but otherwise well and without new complaints.  Plan was discussed with the patient who was in agreement.    Objective: Vital signs in last 24 hours:  Vital Signs:   Filed Vitals:   06/14/13 0940 06/14/13 0945 06/14/13 0950 06/14/13 1000  BP: 153/78 166/77 155/91 162/86  Pulse:      Temp:    98.2 F (36.8 C)  TempSrc:      Resp: 16 28 22 13   Height:      Weight:      SpO2: 100% 99% 100% 100%   Temp:  [97.9 F (36.6 C)-99.2 F (37.3 C)] 98.2 F (36.8 C) (10/18 1000) Pulse Rate:  [82-91] 82 (10/18 0824) Resp:  [13-28] 13 (10/18 1000) BP: (92-166)/(60-95) 162/86 mmHg (10/18 1000) SpO2:  [97 %-100 %] 100 % (10/18 1000) Weight:  [135.989 kg (299 lb 12.8 oz)] 135.989 kg (299 lb 12.8 oz) (10/18 0654) Last BM Date: 06/13/13  24-hour weight change: Weight change: -0.09 kg (-3.2 oz)  Intake/Output:   Intake/Output Summary (Last 24 hours) at 06/14/13 1112 Last data filed at 06/14/13 0700  Gross per 24 hour  Intake 1278.17 ml  Output   1500 ml  Net -221.83 ml      Physical Exam: General: Vital signs reviewed and noted. Well-developed, well-nourished, in no acute distress; alert, appropriate and cooperative throughout examination.  HEENT: Normocephalic, atraumatic, neck supple, mucous membranes slightly dry   Lungs:  Normal respiratory effort. Clear to auscultation BL without crackles or wheezes.  Heart: Distant heart sounds, RRR  Abdomen:  BS normoactive. Soft, Nondistended, non-tender.  No masses or organomegaly.  Extremities: Trace pretibial edema.  Well-healed left BKA site   Lab Results: Basic Metabolic Panel:  Recent Labs Lab 06/13/13 0545 06/13/13 1300 06/13/13 2234 06/14/13 0530  NA 127* 127* 134* 133*  K 5.7* 5.8* 4.7 4.2  CL 94* 97 104 101  CO2 23 18* 19 21  GLUCOSE 363* 420* 250* 240*  BUN 40* 41* 39* 35*  CREATININE 1.68* 1.59* 1.64* 1.59*    CALCIUM 8.3* 7.7* 7.7* 7.8*    CBC:  Recent Labs Lab 06/13/13 0950 06/13/13 1300 06/13/13 1653 06/13/13 2234 06/14/13 0530  WBC 16.2* 16.6* 14.0* 13.4* 9.9  HGB 10.8* 10.1* 9.9* 8.3* 7.5*  HCT 31.8* 30.2* 29.5* 24.3* 22.5*  MCV 88.8 89.3 89.7 88.7 88.9  PLT 292 317 272 247 231    Liver Function Tests:  Recent Labs Lab 06/13/13 0545  AST 15  ALT 16  ALKPHOS 53  BILITOT 0.2*  PROT 7.2  ALBUMIN 3.2*   Cardiac Enzymes:  Recent Labs Lab 06/13/13 1300  TROPONINI <0.30    CBG:  Recent Labs Lab 06/13/13 1809 06/13/13 2148 06/13/13 2300 06/14/13 0513 06/14/13 0745  GLUCAP 335* 239* 238* 225* 225*    Coagulation Studies:  Recent Labs  06/13/13 0545 06/13/13 1300 06/14/13 0530  LABPROT 29.5* 29.7* 20.8*  INR 2.93* 2.95* 1.85*    Other results: EKG: Sinus rhythm, regular, borderline LAD, low voltage, normal R wave progression, normal QT interval, No ischemic change in T waves or ST segments.  EGD: 06/14/13 Findings: 1. Small superficial clean-based duodenal ulcer - doubt related to recent bleeding 2. Mild duodenitis 3. Gastric erosion 4. No source of bleeding seen  Medications: I have reviewed the patient's current medications. Scheduled Meds: . carvedilol  25 mg Oral BID WC  . influenza vac split quadrivalent  PF  0.5 mL Intramuscular Tomorrow-1000  . insulin aspart  0-15 Units Subcutaneous Q4H  . insulin glargine  8 Units Subcutaneous QHS  . isosorbide mononitrate  60 mg Oral Daily  . pantoprazole (PROTONIX) IV  40 mg Intravenous Q24H  . polyethylene glycol-electrolytes  4,000 mL Oral Once  . sodium chloride  3 mL Intravenous Q12H   Continuous Infusions: . sodium chloride     PRN Meds:.  Assessment/Plan: Principal Problem:   GIB (gastrointestinal bleeding) Active Problems:   DIABETES MELLITUS, TYPE II   HYPERTENSION, UNSPECIFIED   Atrial fibrillation   Chronic systolic heart failure   CHRONIC KIDNEY DISEASE STAGE III  (MODERATE)   Summary: Brian Martin is a 57 y.o. who was admitted for evaluation of GI bleeding with signs of active bleeding with Hg dropping from 12.1 to 7.5 since admission.    Impression & Plan:  1. GI Bleed: likely diverticulosis given the painless, bright red blood per rectum, possibly diverticulitis but less likely given lack of leukocytosis, fever, and other signs of infection.  Still consider colitis, or colon CA. - Hg down to 7.5 from 12.1 on admission - 2 units of pRBCs today for overnight Hg of 7.5 - holding warfarin, ASA, lasix given active bleeding and volume loss - EGD this am did not reveal any source of bleeding and mild duodenitis. - Clear liquid diet - Bowel prep for colonoscopy tomorrow - continue protonix 40mg  BID - CE's not drawn last night, continue trops x2  2. CHF - holding lasix and ASA for now given bleeding and volume loss - continue coreg and imdur - monitor fluid status  3. HTN - has been normotensive - continue coreg and imdur  4. Afib - in SR in the 80's - continue coreg  5. DMII - check A1c - blood sugars have improved to 200's from 400's yesterday - SSI  6. Hyponatremia - Resolving, at 134 today - monitor BMETs  7. CKD-III - Cr at 1.64 today near baseline - f/u BMETs  8. Hyperkalemia - resolved, K 4.7 today - f/u BMETs  FEN/GI: clear liquid diet, will have bowel prep today for colonoscopy tomorrow  DVT px: SCDs  Dispo: deferred at this time for improvement of medical problems.  PCP is Dr. Abner Greenspan and will require follow-up after discharge   This is a Medical Student Note.  The care of the patient was discussed with Dr. Virgina Organ and the assessment and plan formulated with their assistance.  Please see their attached note for official documentation of the daily encounter.   LOS: 1 day   Signed: Arna Snipe Internal Medicine, MS4 Pager [phone] 06/14/2013, 11:12 AM

## 2013-06-14 NOTE — Progress Notes (Signed)
The patient is A&Ox4 and did not have any complaints of pain overnight.  His blood sugars were better controlled during the night and remained in the low 200s.  He did not have any complications when receiving the final unit of the FFP and tolerated it well.  His BPs remained stable, but soft and he is still in NSR.  He stated that he was feeling "alright" this morning.  His only complaint was a lack of sleep.  He has been NPO since midnight for his procedure.  His consent for the EGD this morning is signed and in the chart.

## 2013-06-14 NOTE — Brief Op Note (Signed)
Mild duodenitis and clean-based tiny ulceration. No blood products or bleeding seen. No source of bleeding noted on EGD. Prep for colonoscopy to be done tomorrow. See endopro for complete details of EGD.

## 2013-06-14 NOTE — Progress Notes (Signed)
I have seen the patient and reviewed the daily progress note by Arna Snipe MS 4 and discussed the care of the patient with them.  See below for documentation of my findings, assessment, and plans.  Subjective: [name] was seen and examined at bedside this morning.  He had his EGD this morning which showed no obvious source of bleeding and was noted for mild duodenitis and gastric erosion. He is scheduled for colonoscopy tomorrow morning and is asking to start his liquid diet for now. His Hb was noted to drop to 7.5 this morning and is receiving 2 units of PRBCs. He denies any chest pain, fever, chills, shortness of breath, abdominal pain, or any urinary complaints at this time. He did not have any BM since admission.   Objective: Vital signs in last 24 hours: Filed Vitals:   06/14/13 0945 06/14/13 0950 06/14/13 1000 06/14/13 1256  BP: 166/77 155/91 162/86 118/63  Pulse:    85  Temp:   98.2 F (36.8 C) 99.3 F (37.4 C)  TempSrc:    Oral  Resp: 28 22 13 14   Height:      Weight:      SpO2: 99% 100% 100% 100%   Weight change: -3.2 oz (-0.09 kg)  Intake/Output Summary (Last 24 hours) at 06/14/13 1314 Last data filed at 06/14/13 1300  Gross per 24 hour  Intake 2140.67 ml  Output   1500 ml  Net 640.67 ml   Vitals reviewed. General: resting in bed, NAD HEENT: PERRL, EOMI Cardiac: RRR, distant heart sounds due to body habitus Pulm: clear to auscultation bilaterally Abd: soft, nontender, nondistended, BS present Ext: LBKA, RLE warm, mild edema, able to move all 4 extremities Neuro: alert and oriented X3, cranial nerves II-XII grossly intact, sensation to light touch equal in bilateral upper and lower extremities, strength 5/5 b/l upper and RLE.   Lab Results: Reviewed and documented in Electronic Record Micro Results: Reviewed and documented in Electronic Record Studies/Results: Reviewed and documented in Electronic Record Medications: I have reviewed the patient's  current medications. Scheduled Meds: . carvedilol  25 mg Oral BID WC  . insulin aspart  0-15 Units Subcutaneous Q4H  . insulin glargine  8 Units Subcutaneous QHS  . isosorbide mononitrate  60 mg Oral Daily  . pantoprazole (PROTONIX) IV  40 mg Intravenous Q24H  . polyethylene glycol-electrolytes  4,000 mL Oral Once  . sodium chloride  3 mL Intravenous Q12H   Continuous Infusions: . sodium chloride 20 mL (06/14/13 1030)   Assessment/Plan: Principal Problem:   GIB (gastrointestinal bleeding) Active Problems:   DIABETES MELLITUS, TYPE II   HYPERTENSION, UNSPECIFIED   Atrial fibrillation   Chronic systolic heart failure   CHRONIC KIDNEY DISEASE STAGE III (MODERATE) Mr. Klatt is a 57 year old male with DM2, Afib on coumadin, CHF, and CKD 3 admitted for acute GIB.  #: GIB: likely lower.  Presented with painless rectal bleeding, likely secondary to diverticulosis and exacerbated with being on ASA and coumadin (INR 2.93 on admission) .  Other differentials include diverticulitis (afebrile but leukocytosis 19.1 on admission), AVM, PUD (small erosion seen in antrum along with gastric erosion and mild duodenitis per EGD 06/14/13). Additionally, colon cancer and/or polyps of course are included in the differentials and colonoscopy will help further delineate etiology hopefully.  Hb 12.1 on admission and down to 7.5 this morning requiring 2 units PRBC transfusion. He also received 2 units FFP yesterday per GI - appreciate GI following: EGD done today  significant for only small superficial clean-based duodenal ulcer, mild duodenitis, and gastric erosion, colonoscopy scheduled for tomorrow morning - Clear liquid diet along with bowel prep for colonoscopy in AM - NPO after MN  - CBC q6h. Hgb goal for transfusion is 8.0  - Hold coumadin, ASA, Lasix, hydralazine and amlodipine  - IV protonix 40 mg bid  - Trop x 1 negative   #: CHF: 2-D echo on 08/13/12 showed EF of 45-50%. Currently taking Lasix at  home. No leg edema or JVD on physical exam - holding Lasix and aspirin.  - Continue home Coreg and isosorbide mononitrate - Saline lock given hx of CHF, will give IVF if needed with caution.  #: HTN: BP is 107/65 mmHg on admission -Hold hydralazine and amlodipine in the setting of acute GI bleeding.  -Continue home Coreg and isosorbide mononitrate   #: Hx of A. Fib: on coumadin at home. HR is well controlled, in sinus rhythm.  -continue home coreg   #: DM-II: Prior A1c was 11.8 on 06/28/10. Patient is taking Lantus 16 units at home daily  -continue 8 units lantus -SSI moderate -Hb A1c pending  #: Hyponatremia: Improving. Sodium 127 on admission, up to 133 today.  Likely due to decreased oral intake and hyperglycemia. -serum osm 305 and urine osmolality 500 Urine sodium 54 and urine Cr 47.7 -f/u BMP   #: CKD-III: Baseline Cr ~ 1.7 on 1/232012 on our record. Cr 1.68 on admission and trending down. -f/u BMP   #: Hyperkalemia: Resolved.  Up to Potassium 5.8 day of admission but no supportive EKG changes with no peaked T waves. No Kayexalate in setting of acute GI bleeding per GI.  If potassium continues to increase above 6.0 with EKG change, may give lasix  -continue to monitor -May treat with Calcium, insulin and dextrose if K continue to increase and develop EKG change.   #: Diet: Clears and NPO after midnight for colonoscopy tomorrow # DVT px: SCD Dispo: Disposition is deferred at this time, awaiting improvement of current medical problems.  Anticipated discharge in approximately 2 day(s).   The patient does have a current PCP Abner Greenspan, MD) and does not need an [facility] hospital follow-up appointment after discharge.  The patient does not have transportation limitations that hinder transportation to clinic appointments.  Services Needed at time of discharge: Y = Yes, Blank = No PT:   OT:   RN:   Equipment:   Other:     LOS: 1 day   Darden Palmer, MD 06/14/2013, 1:14 PM

## 2013-06-14 NOTE — H&P (View-Only) (Signed)
Reason for Consult: GI bleed Referring Physician: Hospital team  Brian Martin is an 57 y.o. male.  HPI: Patient on Coumadin and aspirin with some GI bleeding both dark and bright red beginning last night which woke him up and he did have a bleeding episode about 8 years ago which was mostly dark but he did not seek medical attention and was on a higher dose of aspirin and nonsteroidals back then but has not had any previous GI workup and nothing runs in his family from a GI standpoint but he has had a little bit of increased upper tract symptoms but no real change in bowel habits or abdominal pain and his case was discussed with the hospital team as well Past Medical History  Diagnosis Date  . Atrial fibrillation   . CHF (congestive heart failure)     chronic systolic CHF  . Diabetes mellitus     type 2  . Hypertension   . Chronic renal failure   . Amputation of leg     Past Surgical History  Procedure Laterality Date  . 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 Diabetes Other     Social History:  reports that he has quit smoking. He does not have any smokeless tobacco history on file. He reports that he uses illicit drugs (Marijuana). He reports that he does not drink alcohol.  Allergies: No Known Allergies  Medications: I have reviewed the patient's current medications.  Results for orders placed during the hospital encounter of 06/13/13 (from the past 48 hour(s))  PROTIME-INR     Status: Abnormal   Collection Time    06/13/13  5:45 AM      Result Value Range   Prothrombin Time 29.5 (*) 11.6 - 15.2 seconds   INR 2.93 (*) 0.00 - 1.49  CBC     Status: Abnormal   Collection Time    06/13/13  5:45 AM      Result Value Range   WBC 19.1 (*) 4.0 - 10.5 K/uL   RBC 4.02 (*) 4.22 - 5.81 MIL/uL   Hemoglobin 12.1 (*) 13.0 - 17.0 g/dL   HCT 35.9 (*) 39.0 - 52.0 %   MCV 89.3  78.0 - 100.0 fL   MCH 30.1  26.0 - 34.0 pg    MCHC 33.7  30.0 - 36.0 g/dL   RDW 13.2  11.5 - 15.5 %   Platelets 313  150 - 400 K/uL  COMPREHENSIVE METABOLIC PANEL     Status: Abnormal   Collection Time    06/13/13  5:45 AM      Result Value Range   Sodium 127 (*) 135 - 145 mEq/L   Potassium 5.7 (*) 3.5 - 5.1 mEq/L   Chloride 94 (*) 96 - 112 mEq/L   CO2 23  19 - 32 mEq/L   Glucose, Bld 363 (*) 70 - 99 mg/dL   BUN 40 (*) 6 - 23 mg/dL   Creatinine, Ser 1.68 (*) 0.50 - 1.35 mg/dL   Calcium 8.3 (*) 8.4 - 10.5 mg/dL   Total Protein 7.2  6.0 - 8.3 g/dL   Albumin 3.2 (*) 3.5 - 5.2 g/dL   AST 15  0 - 37 U/L   ALT 16  0 - 53 U/L   Alkaline Phosphatase 53  39 - 117 U/L   Total Bilirubin 0.2 (*) 0.3 - 1.2 mg/dL   GFR   calc non Af Amer 43 (*) >90 mL/min   GFR calc Af Amer 50 (*) >90 mL/min   Comment: (NOTE)     The eGFR has been calculated using the CKD EPI equation.     This calculation has not been validated in all clinical situations.     eGFR's persistently <90 mL/min signify possible Chronic Kidney     Disease.  OCCULT BLOOD, POC DEVICE     Status: Abnormal   Collection Time    06/13/13  5:59 AM      Result Value Range   Fecal Occult Bld POSITIVE (*) NEGATIVE  CBC     Status: Abnormal   Collection Time    06/13/13  9:50 AM      Result Value Range   WBC 16.2 (*) 4.0 - 10.5 K/uL   RBC 3.58 (*) 4.22 - 5.81 MIL/uL   Hemoglobin 10.8 (*) 13.0 - 17.0 g/dL   HCT 31.8 (*) 39.0 - 52.0 %   MCV 88.8  78.0 - 100.0 fL   MCH 30.2  26.0 - 34.0 pg   MCHC 34.0  30.0 - 36.0 g/dL   RDW 13.3  11.5 - 15.5 %   Platelets 292  150 - 400 K/uL  TYPE AND SCREEN     Status: None   Collection Time    06/13/13  9:50 AM      Result Value Range   ABO/RH(D) O POS     Antibody Screen NEG     Sample Expiration 06/16/2013    GLUCOSE, CAPILLARY     Status: Abnormal   Collection Time    06/13/13 10:37 AM      Result Value Range   Glucose-Capillary 275 (*) 70 - 99 mg/dL  PREPARE FRESH FROZEN PLASMA     Status: None   Collection Time    06/13/13  10:52 AM      Result Value Range   Unit Number [id number]     Blood Component Type THAWED PLASMA     Unit division 00     Status of Unit ALLOCATED     Transfusion Status OK TO TRANSFUSE     Unit Number [id number]     Blood Component Type THAWED PLASMA     Unit division 00     Status of Unit ISSUED     Transfusion Status OK TO TRANSFUSE    CBC     Status: Abnormal   Collection Time    06/13/13  1:00 PM      Result Value Range   WBC 16.6 (*) 4.0 - 10.5 K/uL   RBC 3.38 (*) 4.22 - 5.81 MIL/uL   Hemoglobin 10.1 (*) 13.0 - 17.0 g/dL   HCT 30.2 (*) 39.0 - 52.0 %   MCV 89.3  78.0 - 100.0 fL   MCH 29.9  26.0 - 34.0 pg   MCHC 33.4  30.0 - 36.0 g/dL   RDW 13.5  11.5 - 15.5 %   Platelets 317  150 - 400 K/uL  TROPONIN I     Status: None   Collection Time    06/13/13  1:00 PM      Result Value Range   Troponin I <0.30  <0.30 ng/mL   Comment:            Due to the release kinetics of cTnI,     a negative result within the first hours     of the onset of symptoms does not rule out       myocardial infarction with certainty.     If myocardial infarction is still suspected,     repeat the test at appropriate intervals.  BASIC METABOLIC PANEL     Status: Abnormal   Collection Time    06/13/13  1:00 PM      Result Value Range   Sodium 127 (*) 135 - 145 mEq/L   Potassium 5.8 (*) 3.5 - 5.1 mEq/L   Chloride 97  96 - 112 mEq/L   CO2 18 (*) 19 - 32 mEq/L   Glucose, Bld 420 (*) 70 - 99 mg/dL   BUN 41 (*) 6 - 23 mg/dL   Creatinine, Ser 1.59 (*) 0.50 - 1.35 mg/dL   Calcium 7.7 (*) 8.4 - 10.5 mg/dL   GFR calc non Af Amer 46 (*) >90 mL/min   GFR calc Af Amer 54 (*) >90 mL/min   Comment: (NOTE)     The eGFR has been calculated using the CKD EPI equation.     This calculation has not been validated in all clinical situations.     eGFR's persistently <90 mL/min signify possible Chronic Kidney     Disease.  PROTIME-INR     Status: Abnormal   Collection Time    06/13/13  1:00 PM       Result Value Range   Prothrombin Time 29.7 (*) 11.6 - 15.2 seconds   INR 2.95 (*) 0.00 - 1.49    No results found.  ROS negative except above Blood pressure 121/74, pulse 87, temperature 98.2 F (36.8 C), temperature source Oral, resp. rate 18, height 6' 1" (1.854 m), weight 136.079 kg (300 lb), SpO2 100.00%. Physical Exam Vital signs stable afebrile no acute distress abdomen is soft nontender labs reviewed Assessment/Plan: GI bleeding in a patient on aspirin and Coumadin Plan: Agree with reversing Coumadin with FFP and we discussed the risks benefits and methods of both endoscopy and colonoscopy and we will proceed with an endoscopy tomorrow morning and pending those findings probably a colonoscopy on Sunday and call us sooner when necessary otherwise agree with pump inhibitors and will allow clear liquids today  Trampus Mcquerry E 06/13/2013, 3:01 PM      

## 2013-06-14 NOTE — H&P (Signed)
I saw and evaluated the patient.  I personally confirmed the key portions of Dr. Evorn Gong history and exam and reviewed pertinent patient test results.  The assessment, diagnosis, and plan were formulated together and I agree with the documentation in the resident's note.  [name] is a 57 year old man with a history of atrial fibrillation on Coumadin, diabetes, and hypertension who presents with bright red blood per rectum. Since admission his hemoglobin has dropped 5 points and he has required 2 units of packed red blood cells. Since he is on Coumadin chronically for his atrial fibrillation, and his INR was 2.93 admission, he also received 2 units of fresh frozen plasma. He's had no bleeding since then. On rounds this morning he states he had not yet had a bowel movement at all. He underwent an EGD this morning which revealed a gastric erosion, mild duodenitis, and a small superficial clean-based duodenal ulcer which was not felt to be the source of his GI bleed. He is being prepped today for a colonoscopy tomorrow to assess for a source of bleed. Our biggest concern is bleeding from diverticulosis. Other possibilities include colonic erosion, polyp, or cancer. While awaiting for tomorrow's colonoscopy evaluation we will continue to follow serial hemoglobins, maintain 2 wide bore IVs, maintain an active type and screen, continue to hold the coumadin, and continue PPI therapy especially in light of the small clean-based duodenal ulcer.

## 2013-06-14 NOTE — Interval H&P Note (Signed)
History and Physical Interval Note:  06/14/2013 9:17 AM  Brian Martin  has presented today for surgery, with the diagnosis of upper gi bleed  The various methods of treatment have been discussed with the patient and family. After consideration of risks, benefits and other options for treatment, the patient has consented to  Procedure(s): ESOPHAGOGASTRODUODENOSCOPY (EGD) (N/A) as a surgical intervention .  The patient's history has been reviewed, patient examined, no change in status, stable for surgery.  I have reviewed the patient's chart and labs.  Questions were answered to the patient's satisfaction.     Sarahy Creedon C.

## 2013-06-15 ENCOUNTER — Encounter (HOSPITAL_COMMUNITY)
Admission: EM | Disposition: A | Discharge status: Home / Self Care | Payer: Self-pay | Source: Home / Self Care | Attending: Internal Medicine

## 2013-06-15 ENCOUNTER — Encounter (HOSPITAL_COMMUNITY): Payer: Self-pay

## 2013-06-15 HISTORY — PX: COLONOSCOPY: SHX5424

## 2013-06-15 LAB — CBC
HCT: 27.3 % — ABNORMAL LOW (ref 39.0–52.0)
Hemoglobin: 8.9 g/dL — ABNORMAL LOW (ref 13.0–17.0)
MCH: 29.7 pg (ref 26.0–34.0)
MCHC: 33.7 g/dL (ref 30.0–36.0)
MCHC: 33.5 g/dL (ref 30.0–36.0)
MCHC: 34 g/dL (ref 30.0–36.0)
MCV: 88.9 fL (ref 78.0–100.0)
Platelets: 241 10*3/uL (ref 150–400)
Platelets: 204 10*3/uL (ref 150–400)
Platelets: 229 10*3/uL (ref 150–400)
RBC: 3.06 MIL/uL — ABNORMAL LOW (ref 4.22–5.81)
RDW: 15 % (ref 11.5–15.5)
RDW: 14.8 % (ref 11.5–15.5)
RDW: 15 % (ref 11.5–15.5)
WBC: 9.1 10*3/uL (ref 4.0–10.5)
WBC: 9.1 10*3/uL (ref 4.0–10.5)

## 2013-06-15 LAB — TYPE AND SCREEN
ABO/RH(D): O POS
Antibody Screen: NEGATIVE
Unit division: 0
Unit division: 0

## 2013-06-15 LAB — BASIC METABOLIC PANEL
CO2: 21 mEq/L (ref 19–32)
Calcium: 7.7 mg/dL — ABNORMAL LOW (ref 8.4–10.5)
GFR calc Af Amer: 55 mL/min — ABNORMAL LOW (ref >90)
GFR calc non Af Amer: 48 mL/min — ABNORMAL LOW (ref >90)
Glucose, Bld: 200 mg/dL — ABNORMAL HIGH (ref 70–99)
Potassium: 4 mEq/L (ref 3.5–5.1)
Sodium: 135 mEq/L (ref 135–145)

## 2013-06-15 LAB — GLUCOSE, CAPILLARY
Glucose-Capillary: 172 mg/dL — ABNORMAL HIGH (ref 70–99)
Glucose-Capillary: 204 mg/dL — ABNORMAL HIGH (ref 70–99)
Glucose-Capillary: 218 mg/dL — ABNORMAL HIGH (ref 70–99)

## 2013-06-15 SURGERY — COLONOSCOPY
Anesthesia: Moderate Sedation

## 2013-06-15 MED ORDER — MIDAZOLAM HCL 5 MG/5ML IJ SOLN
INTRAMUSCULAR | Status: DC | PRN
  Administered 2013-06-15 (×2): 2 mg via INTRAVENOUS
  Administered 2013-06-15: 1 mg via INTRAVENOUS

## 2013-06-15 MED ORDER — FENTANYL CITRATE 0.05 MG/ML IJ SOLN
INTRAMUSCULAR | Status: AC
  Filled 2013-06-15: qty 2

## 2013-06-15 MED ORDER — DIPHENHYDRAMINE HCL 50 MG/ML IJ SOLN
INTRAMUSCULAR | Status: AC
  Filled 2013-06-15: qty 1

## 2013-06-15 MED ORDER — MIDAZOLAM HCL 5 MG/ML IJ SOLN
INTRAMUSCULAR | Status: AC
  Filled 2013-06-15: qty 2

## 2013-06-15 MED ORDER — ASPIRIN EC 81 MG PO TBEC: 81.0000 mg | DELAYED_RELEASE_TABLET | Freq: Every day | ORAL | Status: DC

## 2013-06-15 MED ORDER — FENTANYL CITRATE 0.05 MG/ML IJ SOLN
INTRAMUSCULAR | Status: DC | PRN
  Administered 2013-06-15 (×3): 25 ug via INTRAVENOUS

## 2013-06-15 MED ORDER — DIPHENHYDRAMINE HCL 50 MG/ML IJ SOLN
INTRAMUSCULAR | Status: DC | PRN
  Administered 2013-06-15: 25 mg via INTRAVENOUS

## 2013-06-15 NOTE — Progress Notes (Signed)
Subjective: Brian Martin was seen and examined at bedside this morning.  His wife was by bedside as well.  He was looking forward to getting his colonoscopy done today.  He did complete bowel prep and initially had blood BM but then he said everything was just clear. He currently denies any fever, chills, chest pain, SOB, abdominal pain, or any urinary complaints.   Objective: Vital signs in last 24 hours: Filed Vitals:   06/15/13 1230 06/15/13 1235 06/15/13 1240 06/15/13 1245  BP: 109/64 115/77 120/72 120/74  Pulse:      Temp:   98 F (36.7 C)   TempSrc:   Oral   Resp: 15 13 13 13   Height:      Weight:      SpO2: 100% 100% 100% 100%   Weight change:   Intake/Output Summary (Last 24 hours) at 06/15/13 1302 Last data filed at 06/15/13 1235  Gross per 24 hour  Intake   1005 ml  Output    800 ml  Net    205 ml   Vitals reviewed. General: resting in bed, NAD HEENT: PERRL, EOMI Cardiac: distant heart sounds due to body habitus RRR Pulm: clear to auscultation bilaterally Abd: soft, obese, distended, nontender, BS present Ext: LBKA, RLE warm, mild edema of RLE, moving all 4 extremities Neuro: alert and oriented X3, cranial nerves II-XII grossly intact, sensation to light touch equal in bilateral upper extremities. Strength 5/5 b/l upper and RLE.   Lab Results: Basic Metabolic Panel:  Recent Labs Lab 06/14/13 0530 06/15/13 0413  NA 133* 135  K 4.2 4.0  CL 101 104  CO2 21 21  GLUCOSE 240* 200*  BUN 35* 23  CREATININE 1.59* 1.55*  CALCIUM 7.8* 7.7*   Liver Function Tests:  Recent Labs Lab 06/13/13 0545  AST 15  ALT 16  ALKPHOS 53  BILITOT 0.2*  PROT 7.2  ALBUMIN 3.2*   CBC:  Recent Labs Lab 06/14/13 1731 06/15/13 0413  WBC 9.8 10.1  HGB 9.4* 8.9*  HCT 27.7* 26.4*  MCV 88.5 88.6  PLT 229 217   Cardiac Enzymes:  Recent Labs Lab 06/13/13 1300  TROPONINI <0.30   CBG:  Recent Labs Lab 06/14/13 0745 06/14/13 1205 06/14/13 1705 06/14/13 2101  06/15/13 0756 06/15/13 1122  GLUCAP 225* 212* 200* 214* 172* 183*   Hemoglobin A1C:  Recent Labs Lab 06/14/13 0530  HGBA1C 11.3*   Coagulation:  Recent Labs Lab 06/13/13 0545 06/13/13 1300 06/14/13 0530  LABPROT 29.5* 29.7* 20.8*  INR 2.93* 2.95* 1.85*   Urine Drug Screen: Drugs of Abuse     Component Value Date/Time   LABOPIA NEGATIVE 06/28/2010 1915   COCAINSCRNUR NEGATIVE 06/28/2010 1915   LABBENZ NEGATIVE 06/28/2010 1915   AMPHETMU NEGATIVE 06/28/2010 1915    Medications: I have reviewed the patient's current medications. Scheduled Meds: . carvedilol  25 mg Oral BID WC  . insulin aspart  0-15 Units Subcutaneous Q4H  . insulin glargine  8 Units Subcutaneous QHS  . isosorbide mononitrate  60 mg Oral Daily  . pantoprazole (PROTONIX) IV  40 mg Intravenous Q24H  . sodium chloride  3 mL Intravenous Q12H   Continuous Infusions: . sodium chloride 20 mL (06/14/13 1030)   PRN Meds:. Assessment/Plan: Principal Problem:   GIB (gastrointestinal bleeding) Active Problems:   DIABETES MELLITUS, TYPE II   HYPERTENSION, UNSPECIFIED   Chronic systolic heart failure   CHRONIC KIDNEY DISEASE STAGE III (MODERATE)   Chronic anticoagulation   Atrial fibrillation  Morbid obesity  Brian Martin is a 57 year old male with DM2, Afib on coumadin, CHF, and CKD 3 admitted for acute GIB.   #: Lower GIB secondary to diverticulosis in the setting of ASA and coumadin (INR 2.93 on admission). Presented with painless rectal bleeding x1 day. GI following, EGD 06/14/13: small erosion seen in antrum along with gastric erosion and mild duodenitis.  Colonoscopy done today 06/15/13: diffuse diverticulosis without active bleeding with likely resolved bleeding, 2 small dark maroon clots.  Hb 12.1 on admission and down to 7.5 yesterday requiring 2 units PRBC transfusion 06/14/13. He also received 2 units FFP 06/13/13 per GI  - appreciate GI following: colonoscopy done today, likely resolved bleed  secondary to diverticulosis.   - advance diet to carb modified - will continue to monitor Hb, down to 8.9 this morning - Holding coumadin, Lasix, hydralazine and amlodipine   - IV protonix 40 mg bid  - Trop x 1 negative   #: CHF: 2-D echo on 08/13/12 showed EF of 45-50%. Currently taking Lasix at home. No leg edema or JVD on physical exam.  Weight 300lb on admission, down to 299lb today.  - holding Lasix, BP stable, likely resume tomorrow - resume ASA - Continue home Coreg and isosorbide mononitrate  - Saline lock given hx of CHF, will give IVF if needed with caution  #: HTN: BP is 107/65 mmHg on admission  -Hold hydralazine and amlodipine in the setting of acute GI bleeding, likely resume tomorrow  -Continue home Coreg and isosorbide mononitrate   #: Hx of A. Fib: on coumadin at home. HR is well controlled, in sinus rhythm this admission.  -continue home coreg  -okay to resume anticoagulation per GI s/p colonoscopy given no evidence of active bleed -will discuss with patient: per HAS-BLED score of 4, ~8.7%/year bleeding rate  #: DM-II: Hb A1C 11.3 on admission. Patient is taking Lantus 16 units at home daily  -restart home dose lantus since back on carb modified diet   -SSI moderate  -Hb A1c 11.3  #: Hyponatremia: Resolved. Sodium 127 on admission, up to 133 today. Likely due to decreased oral intake and hyperglycemia.  -serum osm 305 and urine osmolality 500 Urine sodium 54 and urine Cr 47.7   #: CKD-III: Baseline Cr ~ 1.7 on 1/232012 on our record. Cr 1.68 on admission and trending down to 1.55 today.  #: Hyperkalemia: Resolved. Up to Potassium 5.8 day of admission but no supportive EKG changes with no peaked T waves. No Kayexalate in setting of acute GI bleeding per GI.  # Diet: Carb modified # DVT px: SCD  Dispo: Disposition is deferred at this time, awaiting improvement of current medical problems. Anticipated discharge in approximately 1 day(s).   The patient does have a  current PCP Abner Greenspan, MD) and does not need an [facility] hospital follow-up appointment after discharge.  The patient does not have transportation limitations that hinder transportation to clinic appointments.  Services Needed at time of discharge: Y = Yes, Blank = No PT:   OT:   RN:   Equipment:   Other:     LOS: 2 days   Darden Palmer, MD 06/15/2013, 1:02 PM

## 2013-06-15 NOTE — Brief Op Note (Signed)
Likely resolved diverticular bleed; No active bleeding and 2 small dark maroon clots but otherwise no blood products. Start carb modified diet. OK to resume Coumadin if needed. Will sign off. Call with questions.

## 2013-06-15 NOTE — Op Note (Signed)
[facility] [address] [place], [address]   COLONOSCOPY PROCEDURE REPORT  PATIENT: Brian Martin, Brian Martin  MR#: [id number] BIRTHDATE: 25-Jan-1956 , 57  yrs. old GENDER: Male ENDOSCOPIST: Charlott Rakes, MD REFERRED [id number] team PROCEDURE DATE:  06/15/2013 PROCEDURE:   Colonoscopy, diagnostic ASA CLASS:   Class III INDICATIONS:hematochezia. MEDICATIONS: Fentanyl 75 mcg IV, Versed 5 mg IV, Diphenhydramine (Benadryl) 25 mg IV, and Cetacaine spray x 2  DESCRIPTION OF PROCEDURE:   After the risks benefits and alternatives of the procedure were thoroughly explained, informed consent was obtained.  The Pentax Ped Colon [id number]  endoscope was introduced through the anus and advanced to the cecum, which was identified by both the appendix and ileocecal valve , limited by No adverse events experienced.   The quality of the prep was fair. . The instrument was then slowly withdrawn as the colon was fully examined.     FINDINGS:  Rectal exam unremarkable.  Pediatric colonoscope inserted into the colon and advanced to the cecum, where the appendiceal orifice and ileocecal valve were identified.    Green liquid stool noted throughout parts of the colon with a few small maroon-colored clots seen (on the left side). No red blood seen throughout the colonoscopy. Scattered large and small diverticuli seen throughout the colon. On careful withdrawal of the colonoscope, diverticulosis again noted throughout the colon.    Retroflexion revealed small internal hemorrhoids.  COMPLICATIONS: None  IMPRESSION:     Diffuse diverticulosis without active bleeding Suspect bleeding was diverticuli in origin and has since resolved   RECOMMENDATIONS: No further GI workup needed; Ok to resume anticoagulation if needed by primary team; Carb modified diet    ______________________________ eSigned:  Charlott Rakes, MD 06/15/2013 12:51 PM   CC:

## 2013-06-15 NOTE — Interval H&P Note (Signed)
History and Physical Interval Note:  06/15/2013 12:00 PM  Brian Martin  has presented today for surgery, with the diagnosis of gib  The various methods of treatment have been discussed with the patient and family. After consideration of risks, benefits and other options for treatment, the patient has consented to  Procedure(s): COLONOSCOPY (N/A) as a surgical intervention .  The patient's history has been reviewed, patient examined, no change in status, stable for surgery.  I have reviewed the patient's chart and labs.  Questions were answered to the patient's satisfaction.     Tearia Gibbs C.

## 2013-06-16 ENCOUNTER — Encounter (HOSPITAL_COMMUNITY): Payer: Self-pay | Admitting: Gastroenterology

## 2013-06-16 LAB — CBC
HCT: 25.7 % — ABNORMAL LOW (ref 39.0–52.0)
Hemoglobin: 8.6 g/dL — ABNORMAL LOW (ref 13.0–17.0)
Hemoglobin: 8.5 g/dL — ABNORMAL LOW (ref 13.0–17.0)
MCH: 29.6 pg (ref 26.0–34.0)
MCH: 29.7 pg (ref 26.0–34.0)
MCHC: 32.8 g/dL (ref 30.0–36.0)
MCHC: 33.1 g/dL (ref 30.0–36.0)
MCV: 90 fL (ref 78.0–100.0)
MCV: 89.9 fL (ref 78.0–100.0)
Platelets: 218 10*3/uL (ref 150–400)
RBC: 2.91 MIL/uL — ABNORMAL LOW (ref 4.22–5.81)
RBC: 2.86 MIL/uL — ABNORMAL LOW (ref 4.22–5.81)
RDW: 15.1 % (ref 11.5–15.5)
RDW: 14.9 % (ref 11.5–15.5)
WBC: 9.7 10*3/uL (ref 4.0–10.5)

## 2013-06-16 LAB — GLUCOSE, CAPILLARY: Glucose-Capillary: 215 mg/dL — ABNORMAL HIGH (ref 70–99)

## 2013-06-16 MED ORDER — FERROUS SULFATE 325 (65 FE) MG PO TABS: 325.0000 mg | ORAL_TABLET | Freq: Every day | ORAL | Status: DC

## 2013-06-16 MED ORDER — PANTOPRAZOLE SODIUM 40 MG PO TBEC: 40.0000 mg | DELAYED_RELEASE_TABLET | Freq: Every day | ORAL | Status: AC

## 2013-06-16 MED ORDER — DOCUSATE SODIUM 100 MG PO CAPS: 100.0000 mg | ORAL_CAPSULE | Freq: Two times a day (BID) | ORAL | Status: DC | PRN

## 2013-06-16 NOTE — Discharge Summary (Signed)
Patient Name:  Brian Martin  MRN: [id number]  PCP: Abner Greenspan, MD  DOB:  [birth year]       Date of Admission:  06/13/2013  Date of Discharge:  06/16/2013      Attending Physician: Dr. Jonah Blue, DO         DISCHARGE DIAGNOSES: 1. Principal Problem: 2.   GIB (gastrointestinal bleeding) 3. Active Problems: 4.   DIABETES MELLITUS, TYPE II 5.   HYPERTENSION, UNSPECIFIED 6.   Atrial fibrillation 7.   Chronic systolic heart failure 8.   CHRONIC KIDNEY DISEASE STAGE III (MODERATE) 9.   Morbid obesity 10.   Chronic anticoagulation 11.    DISPOSITION AND FOLLOW-UP: Brian Martin is to follow-up with the listed providers as detailed below, at patient's visiting, please address following issues:  1. Please check  INR on 10/23 /14 or 06/20/13.   2. Please repeat CBC to assure patient Hgb is stable at follow up visit with PCP  3. Please check CBG and make adjustment of insulin regimen.  4. Please evaluate patient's blood pressure and volume status, and adjust his blood pressure medications and decide whether he needs to restart his lasix. His Amlodipine, lasix and hydralazine were on hold at discharge.    Follow-up Information   Follow up with HODGES,FRANCISCO, MD On 06/19/2013. (at 8:40, please ask your PCP to schedule an appointment with GI Dr. Levin Erp at [facility] to establish care in 1-2 months)    Specialty:  [facility]      Future Appointments Provider Department Dept Phone   07/01/2013 4:15 PM [facility] [place] Office [phone]       DISCHARGE MEDICATIONS:   Medication List    STOP taking these medications       amLODipine 5 MG tablet  Commonly known as:  NORVASC     aspirin 81 MG tablet     furosemide 40 MG tablet  Commonly known as:  LASIX     hydrALAZINE 25 MG tablet  Commonly known as:  APRESOLINE      TAKE these medications       carvedilol 25 MG tablet  Commonly known as:  COREG  Take 25  mg by mouth 2 (two) times daily with a meal.     docusate sodium 100 MG capsule  Commonly known as:  COLACE  Take 1 capsule (100 mg total) by mouth 2 (two) times daily as needed for constipation.     ferrous sulfate 325 (65 FE) MG tablet  Take 1 tablet (325 mg total) by mouth daily with breakfast.     insulin glargine 100 UNIT/ML injection  Commonly known as:  LANTUS  Inject 10 Units into the skin at bedtime.     isosorbide mononitrate 60 MG 24 hr tablet  Commonly known as:  IMDUR  Take 60 mg by mouth daily.     pantoprazole 40 MG tablet  Commonly known as:  PROTONIX  Take 1 tablet (40 mg total) by mouth daily.     warfarin 3 MG tablet  Commonly known as:  COUMADIN  TAKE AS DIRECTED BY COUMADIN CLINIC        CONSULTS: GI Treatment Team:  Petra Kuba, MD    PROCEDURES PERFORMED:  No results found.     ADMISSION DATA: H&P:  Patient is a 57 year old man with a past medical history of atrial fibrillation on Coumadin, CKD-III, CHF (EF 45-50% on 08/13/12),  diabetes type 2, hypertension  and Left BKA, who presents with rectal bleeding.  Patient reports that he has been doing fine until early morning today. At about 2 AM, patient felt urge to go to the bathroom. He went to the bathroom and had a larger volume of bright red colored stool. About 45 minutes later, he had another smaller volume of bloody stool. He reports that he did not have hx of GI problems, no history of gastric ulcer. He did not have a colonoscopy in the past. He does not have any abdominal discomfort or abdominal pain. He does not have shortness of breath or chest pain, but does have mild lightheadedness.   Of note, patient is taking Coumadin for A fib and is also on aspirin. His INR is 2.93 today. Patient is hemodynamically stable when I saw patient in ED. His blood pressure is 107/65/mmHg. Heart rate is 85/minute. Oxygen saturation 99% on room air. Patient had another episode of bloody stool, which is dark  colored per Nurse in Ed.   ROS:  Denies fever, chills, headaches, cough, chest pain, SOB, abdominal pain, constipation, dysuria, urgency, frequency, hematuria. Physical Exam:     Filed Vitals:     06/13/13 1330  06/13/13 1345  06/13/13 1445  06/13/13 1545   BP:  105/71  121/74  115/74  118/73   Pulse:  86  87  90  85   Temp:  98.4 F (36.9 C)  98.2 F (36.8 C)  98.7 F (37.1 C)  98.2 F (36.8 C)   TempSrc:  Oral  Oral  Oral  Oral   Resp:  18  18  16  16    Height:           Weight:           SpO2:  100%  100%  98%  100%     General: Not in acute distress. Pale HEENT: PERRL, EOMI, no scleral icterus, No JVD or bruit Cardiac: regular rate and rhythm (though patient has diagnosis of A fib), No murmurs, gallops or rubs Pulm: Good air movement bilaterally. Clear to auscultation bilaterally. No rales, wheezing, rhonchi or rubs. Abd: Soft, nondistended, nontender, no rebound pain, no organomegaly, BS present Ext: No edema. 2+DP/PT pulse bilaterally. L BKA Musculoskeletal: No joint deformities, erythema, or stiffness, ROM full Skin: No rashes.   Neuro: Alert and oriented X3, cranial nerves II-XII grossly intact, muscle strength 5/5 in all extremeties, sensation to light touch intact.   Psych: Patient is not psychotic, no suicidal or hemocidal ideation.  Lab results: Basic Metabolic Panel:  Recent Labs   06/13/13 0545  06/13/13 1300   NA  127*  127*   K  5.7*  5.8*   CL  94*  97   CO2  23  18*   GLUCOSE  363*  420*   BUN  40*  41*   CREATININE  1.68*  1.59*   CALCIUM  8.3*  7.7*    Liver Function Tests:  Recent Labs   06/13/13 0545   AST  15   ALT  16   ALKPHOS  53   BILITOT  0.2*   PROT  7.2   ALBUMIN  3.2*    No results found for this basename: LIPASE, AMYLASE,  in the last 72 hours No results found for this basename: AMMONIA,  in the last 72 hours CBC:  Recent Labs   06/13/13 0950  06/13/13 1300   WBC  16.2*  16.6*   HGB  10.8*  10.1*   HCT  31.8*  30.2*    MCV  88.8  89.3   PLT  292  317    Cardiac Enzymes:  Recent Labs   06/13/13 1300   TROPONINI  <0.30    BNP: No results found for this basename: PROBNP,  in the last 72 hours D-Dimer: No results found for this basename: DDIMER,  in the last 72 hours CBG:  Recent Labs   06/13/13 1037   GLUCAP  275*    Hemoglobin A1C: No results found for this basename: HGBA1C,  in the last 72 hours Fasting Lipid Panel: No results found for this basename: CHOL, HDL, LDLCALC, TRIG, CHOLHDL, LDLDIRECT,  in the last 72 hours Thyroid Function Tests: No results found for this basename: TSH, T4TOTAL, FREET4, T3FREE, THYROIDAB,  in the last 72 hours Anemia Panel: No results found for this basename: VITAMINB12, FOLATE, FERRITIN, TIBC, IRON, RETICCTPCT,  in the last 72 hours Coagulation:  Recent Labs   06/13/13 0545  06/13/13 1300   LABPROT  29.5*  29.7*   INR  2.93*  2.95*    Urine Drug Screen: Drugs of Abuse      Component  Value  Date/Time     LABOPIA  NEGATIVE  06/28/2010 1915     COCAINSCRNUR  NEGATIVE  06/28/2010 1915     LABBENZ  NEGATIVE  06/28/2010 1915     AMPHETMU  NEGATIVE  06/28/2010 1915    HOSPITAL COURSE:  #: Lower GIB secondary to diverticulosis: In the setting of ASA and coumadin (INR 2.93 on admission). Presented with painless rectal bleeding x1 day.  Patient's Hg on admission was 12.1 and dropped to 10.1 while in the ED.  He received IVF and 2 units of FFP and his ASA and Coumadin were held.  Given his acute volume losses, his Lasix, amlodipine and hydralazine were also held to prevent hemodynamic instability. Due to continuous dropping of Hgb, patient was transfused 2 units of pRBCs in the setting of symptomatic (weakness, dizziness, lightheadedness) bleeding with improvement of Hg to 9.1. GI was consulted who performed EGD on 06/14/18 which revealed a small antral erosion, gastric erosion and mild duodenitis, which was not felt to be the cause of his bleeding. Patient then  underwent colonoscopy on 06/15/13 which revealed diverticulosis without active bleeding which was felt to be the source of the bleeding that had now resolved. They also noted 2 small dark maroon clots.  At time of discharge, the patient was hemodynamically stable and his Hg has been stable between 8.5 and 8.9 for 24 hrs. During his course, he was started on 40mg  IV protonix and will continue PO 40mg  daily.  He has also been prescribed ferrous sulfate 325mg  po TID for 3 months plus colace 100 po BID prn for constipation.  Per GI,  they felt it was safe to restart anticoagulation therapy.  On discharge, his INR was 1.85.  After speaking with pharmacy, they recommended restarting his home dose of 3 mg daily.  We spoke with the patient who promised to have his INR checked at [facility] on 06/19/13 or 06/20/13.  He will follow-up with his PCP Dr. Yetta Flock at [facility] medicine in [place] on 06/19/13 and he will establish care with GI at [facility] through his PCP referral.   #: CHF: 2-D echo on 08/13/12 showed EF of 45-50%. Lasix was held in the setting of acute volume loss due to active GI bleed.  Patient was maintained on his home Coreg  and Imdur.  Patient did not have any signs of volume overload during his hospital course.  No leg edema or JVD on physical exam. Weight 300lb on admission, down to 299lb on hospital day #2 and is at 291 at time of discharge. His lasix was held at discharge.   #: HTN: BP was 107/65 mmHg on admission and Lasix, hydralazine and amlodipine were held as above in the setting of acute GI bleed.  He was continued on Coreg and Imdur because of less risk for hypotension.  We did not restart Lasix, hydralazine and amlodipine at discharge due to normal SBP's in the 120's and to avoid hypotension.  We deferred restarting his medications until his follow-up appointment with his PCP Dr. Yetta Flock on 06/20/13.  #: Hx of A. Fib: on coumadin at home.  Patient's HR was well controlled and in sinus  rhythm during this admission.  He was continued on home Coreg.  Coumadin was held and FFP x2 was given as above.  GI felt it was safe to begin anticoagulation after EGD and colonoscopy showed no active bleeding.  Patient has a HAS-BLED score of 4, or ~8.7% risk of bleed per year.  However, he has a CHADS score of 3 and anticoagulation is warranted.  After discussion the risks and benefits of anticoagulation with the patient, he wishes to continue Coumadin for afib stroke prevention with the risk of future GI re-bleed.  As described above, his INR at discharge is 1.85 and he will resume his home Coumadin dose of 3mg .  Patient promised to have INR checked 06/20/13 or 06/11/13.  Given his Coumadin use, recent GI bleed, and the absence of a history of CVA, MI, or stent, we did not resume his ASA at discharge.  We will defer to his cardiologist for future consideration for ASA use.  #: DM-II: Hb A1C 11.3 on admission. Patient is taking Lantus 16 units at home daily.  During his admission, he was given Lantus 8U daily with SSI.  We was restarted on his home regimen after discharge.  We recommend his PCP re-evaluate his insulin regimen given A1c is not at goal.    #: Hyponatremia: Resolved.  Patient's sodium was 127 on admission, but improved during hospital course and was 135 at time of discharge.  Likely due to decreased oral intake and hyperglycemia.  Serum osm was 305 with urine osmolality 500 Urine sodium 54 and urine Cr 47.7.  #: CKD-III: Baseline Cr ~ 1.7 on 09/19/2010 on our record. Cr 1.68 on admission and trended down to 1.55 at time of discharge.   #: Hyperkalemia: Resolved.  Potassium was 5.8 day of admission but no supportive EKG changes with no peaked T waves. No Kayexalate was given in the setting of acute GI bleeding and pre-procedure for EGD per GI. Potassium at time of discharge was 4.0.   DISCHARGE DATA: Vital Signs: BP 124/69  Pulse 80  Temp(Src) 97.2 F (36.2 C) (Oral)  Resp 20  Ht 6'  1" (1.854 m)  Wt 291 lb 3.2 oz (132.087 kg)  BMI 38.43 kg/m2  SpO2 100%  Labs: Results for orders placed during the hospital encounter of 06/13/13 (from the past 24 hour(s))  CBC     Status: Abnormal   Collection Time    06/15/13  4:00 PM      Result Value Range   WBC 8.3  4.0 - 10.5 K/uL   RBC 2.80 (*) 4.22 - 5.81 MIL/uL   Hemoglobin 8.4 (*) 13.0 -  17.0 g/dL   HCT 13.0 (*) 86.5 - 78.4 %   MCV 88.2  78.0 - 100.0 fL   MCH 30.0  26.0 - 34.0 pg   MCHC 34.0  30.0 - 36.0 g/dL   RDW 69.6  29.5 - 28.4 %   Platelets 204  150 - 400 K/uL  GLUCOSE, CAPILLARY     Status: Abnormal   Collection Time    06/15/13  4:54 PM      Result Value Range   Glucose-Capillary 204 (*) 70 - 99 mg/dL   Comment 1 Notify RN    GLUCOSE, CAPILLARY     Status: Abnormal   Collection Time    06/15/13  9:09 PM      Result Value Range   Glucose-Capillary 218 (*) 70 - 99 mg/dL  CBC     Status: Abnormal   Collection Time    06/15/13 10:14 PM      Result Value Range   WBC 9.1  4.0 - 10.5 K/uL   RBC 3.06 (*) 4.22 - 5.81 MIL/uL   Hemoglobin 8.9 (*) 13.0 - 17.0 g/dL   HCT 13.2 (*) 44.0 - 10.2 %   MCV 89.2  78.0 - 100.0 fL   MCH 29.1  26.0 - 34.0 pg   MCHC 32.6  30.0 - 36.0 g/dL   RDW 72.5  36.6 - 44.0 %   Platelets 229  150 - 400 K/uL  CBC     Status: Abnormal   Collection Time    06/16/13  5:15 AM      Result Value Range   WBC 9.0  4.0 - 10.5 K/uL   RBC 2.91 (*) 4.22 - 5.81 MIL/uL   Hemoglobin 8.6 (*) 13.0 - 17.0 g/dL   HCT 34.7 (*) 42.5 - 95.6 %   MCV 90.0  78.0 - 100.0 fL   MCH 29.6  26.0 - 34.0 pg   MCHC 32.8  30.0 - 36.0 g/dL   RDW 38.7  56.4 - 33.2 %   Platelets 214  150 - 400 K/uL  GLUCOSE, CAPILLARY     Status: Abnormal   Collection Time    06/16/13  8:38 AM      Result Value Range   Glucose-Capillary 229 (*) 70 - 99 mg/dL  CBC     Status: Abnormal   Collection Time    06/16/13 11:35 AM      Result Value Range   WBC 9.7  4.0 - 10.5 K/uL   RBC 2.86 (*) 4.22 - 5.81 MIL/uL   Hemoglobin  8.5 (*) 13.0 - 17.0 g/dL   HCT 95.1 (*) 88.4 - 16.6 %   MCV 89.9  78.0 - 100.0 fL   MCH 29.7  26.0 - 34.0 pg   MCHC 33.1  30.0 - 36.0 g/dL   RDW 06.3  01.6 - 01.0 %   Platelets 218  150 - 400 K/uL  GLUCOSE, CAPILLARY     Status: Abnormal   Collection Time    06/16/13 11:43 AM      Result Value Range   Glucose-Capillary 215 (*) 70 - 99 mg/dL     Services Ordered on Discharge: Y = Yes; Blank = No PT:   OT:   RN:   Equipment:   Other:      Time Spent on Discharge: 35 min   Signed: Lorretta Harp, MD PGY 3, Internal Medicine Resident 06/16/2013, 3:03 PM

## 2013-06-16 NOTE — Progress Notes (Signed)
IM Attending Daily Progress Note  Subjective: Feels well this morning. Eating breakfast without difficulty. No abdominal pain. No melena or hematochezia.  Objective: Vital signs in last 24 hours: Temp:  [97.9 F (36.6 C)-98.8 F (37.1 C)] 97.9 F (36.6 C) (10/20 0452) Pulse Rate:  [77-84] 77 (10/20 0452) Resp:  [5-20] 18 (10/20 0452) BP: (96-149)/(56-87) 112/64 mmHg (10/20 0452) SpO2:  [97 %-100 %] 98 % (10/20 0452) Weight:  [291 lb 3.2 oz (132.087 kg)] 291 lb 3.2 oz (132.087 kg) (10/20 0452) Weight change:  Last BM Date: 06/15/13  Intake/Output from previous day: 10/19 0701 - 10/20 0700 In: 800 [P.O.:600; I.V.:200] Out: 1200 [Urine:1200] Intake/Output this shift: Total I/O In: 240 [P.O.:240] Out: -   General appearance: alert, cooperative and no distress Resp: clear to auscultation bilaterally Cardio: regular rate and rhythm, S1, S2 normal, no murmur, click, rub or gallop GI: soft, non-tender; bowel sounds normal; no masses,  no organomegaly Extremities: edema 1+ bilaterally, pulses intact 2+  Lab Results:  Recent Labs  06/15/13 2214 06/16/13 0515  WBC 9.1 9.0  HGB 8.9* 8.6*  HCT 27.3* 26.2*  PLT 229 214   BMET  Recent Labs  06/14/13 0530 06/15/13 0413  NA 133* 135  K 4.2 4.0  CL 101 104  CO2 21 21  GLUCOSE 240* 200*  BUN 35* 23  CREATININE 1.59* 1.55*  CALCIUM 7.8* 7.7*    Studies/Results: No results found.  Medications: I have reviewed the patient's current medications.  Assessment/Plan: 57 year old male with a past medical history significant for paroxysmal Afib on coumadin, Type 2 DM, CHF, CKD stage 3, admitted with evidence of a lower GIB. -s/p EGD and colonoscopy. Likely diverticular bleed. No further evidence of bleeding.  I discussed with him risks vs benefit on coumadin and risk of re-bleed. He wishes to continue coumadin therapy for Afib given risk of re-bleed.  -Medically stable for D/C. Needs outpatient follow up with PCP in 1-2 weeks.  Answered all questions.   LOS: 3 days   Jonah Blue, DO, FACP Faculty [facility] Internal Medicine Residency Program 06/16/2013, 11:01 AM

## 2013-06-16 NOTE — Progress Notes (Signed)
Subjective: Patient is currently resting comfortably in bed.  He reports feeling well today and denies chest pain, shortness of breath, abdominal pain or bloody stools.  He has been eating a regular diet without issues.      Objective: Vital signs in last 24 hours:  Vital Signs:   Filed Vitals:   06/15/13 1513 06/15/13 2125 06/16/13 0452 06/16/13 1409  BP: 96/63 109/56 112/64 124/69  Pulse: 80 83 77 80  Temp: 98.3 F (36.8 C) 98.4 F (36.9 C) 97.9 F (36.6 C) 97.2 F (36.2 C)  TempSrc: Oral Oral Oral Oral  Resp: 16 18 18 20   Height:      Weight:   132.087 kg (291 lb 3.2 oz)   SpO2: 98% 99% 98% 100%   Temp:  [97.2 F (36.2 C)-98.4 F (36.9 C)] 97.2 F (36.2 C) (10/20 1409) Pulse Rate:  [77-83] 80 (10/20 1409) Resp:  [18-20] 20 (10/20 1409) BP: (109-124)/(56-69) 124/69 mmHg (10/20 1409) SpO2:  [98 %-100 %] 100 % (10/20 1409) Weight:  [132.087 kg (291 lb 3.2 oz)] 132.087 kg (291 lb 3.2 oz) (10/20 0452) Last BM Date: 06/15/13  24-hour weight change: Weight change:   Intake/Output:   Intake/Output Summary (Last 24 hours) at 06/16/13 1535 Last data filed at 06/16/13 1300  Gross per 24 hour  Intake    840 ml  Output   1200 ml  Net   -360 ml      Physical Exam: General: Vital signs reviewed and noted. Well-developed, well-nourished, in no acute distress; alert, appropriate and cooperative throughout examination.  HEENT: Normocephalic, atraumatic, neck supple, poor dentition  Lungs:  Normal respiratory effort. Clear to auscultation BL without crackles or wheezes.  Heart: Distant heart sounds, RRR  Abdomen:  BS normoactive. Soft, Nondistended, non-tender.  No masses or organomegaly.  Extremities: Trace pretibial edema.  Well-healed left BKA site   Lab Results: Basic Metabolic Panel:  Recent Labs Lab 06/13/13 0545 06/13/13 1300 06/13/13 2234 06/14/13 0530 06/15/13 0413  NA 127* 127* 134* 133* 135  K 5.7* 5.8* 4.7 4.2 4.0  CL 94* 97 104 101 104  CO2 23 18* 19  21 21   GLUCOSE 363* 420* 250* 240* 200*  BUN 40* 41* 39* 35* 23  CREATININE 1.68* 1.59* 1.64* 1.59* 1.55*  CALCIUM 8.3* 7.7* 7.7* 7.8* 7.7*    CBC:  Recent Labs Lab 06/15/13 1416 06/15/13 1600 06/15/13 2214 06/16/13 0515 06/16/13 1135  WBC 9.1 8.3 9.1 9.0 9.7  HGB 9.1* 8.4* 8.9* 8.6* 8.5*  HCT 27.2* 24.7* 27.3* 26.2* 25.7*  MCV 88.9 88.2 89.2 90.0 89.9  PLT 241 204 229 214 218    Liver Function Tests:  Recent Labs Lab 06/13/13 0545  AST 15  ALT 16  ALKPHOS 53  BILITOT 0.2*  PROT 7.2  ALBUMIN 3.2*   Cardiac Enzymes:  Recent Labs Lab 06/13/13 1300  TROPONINI <0.30    CBG:  Recent Labs Lab 06/15/13 1122 06/15/13 1654 06/15/13 2109 06/16/13 0838 06/16/13 1143  GLUCAP 183* 204* 218* 229* 215*    Coagulation Studies:  Recent Labs  06/14/13 0530  LABPROT 20.8*  INR 1.85*    Other results: EKG: Sinus rhythm, regular, borderline LAD, low voltage, normal R wave progression, normal QT interval, No ischemic change in T waves or ST segments.  EGD: 06/14/13 Findings: 1. Small superficial clean-based duodenal ulcer - doubt related to recent bleeding 2. Mild duodenitis 3. Gastric erosion 4. No source of bleeding seen  Colonoscopy: 06/15/13: Findings and Impression:  Few small maroon-colored clots, no red blood throughout Small internal hemorrhoids Diffuse diverticulosis without active bleeding Suspect bleeding was diverticuli in origin and has since resolved    Medications: I have reviewed the patient's current medications. Scheduled Meds: . carvedilol  25 mg Oral BID WC  . insulin aspart  0-15 Units Subcutaneous Q4H  . insulin glargine  8 Units Subcutaneous QHS  . isosorbide mononitrate  60 mg Oral Daily  . pantoprazole (PROTONIX) IV  40 mg Intravenous Q24H  . sodium chloride  3 mL Intravenous Q12H   Continuous Infusions: . sodium chloride 20 mL (06/14/13 1030)   PRN Meds:.  Assessment/Plan: Principal Problem:   GIB  (gastrointestinal bleeding) Active Problems:   DIABETES MELLITUS, TYPE II   HYPERTENSION, UNSPECIFIED   Atrial fibrillation   Chronic systolic heart failure   CHRONIC KIDNEY DISEASE STAGE III (MODERATE)   Morbid obesity   Chronic anticoagulation   Summary: Brian Martin is a 57 y.o. who was admitted for evaluation of GI bleeding with signs of active bleeding with Hg dropping from 12.1 to 7.5 since admission.    Impression & Plan:  1. GI Bleed: likely diverticulosis given the painless, bright red blood per rectum, and EGD + colonoscopy findings. - Hg stable at 8.5 s/p 2 units pRBCs; was 12.1 on admission - hed warfarin, ASA, lasix given active bleeding and volume loss at admission - renal OK to restart anticoagulation - restarting warfarin at 3mg  at discharge per pharmacy recs, pt to check INR on thurs or fri - hold lasix, amlodipine, hydralazine - will give ferrous sulfate po on discharge  2. CHF - holding lasix, amlodipine, hydralazine, and ASA for now given bleeding and volume loss and normotensive SBP in the 100's-120's - continue coreg and imdur - monitor fluid status  3. HTN - has been normotensive - continue coreg and imdur  4. Afib - in SR in the 80's - continue coreg - will restart coumadin as above  5. DMII - A1c 11.3 - continued on lantus, will restart home dose at dc - SSI  6. Hyponatremia - Resolved, at 135 today  7. CKD-III - Cr at 1.55 yesterday near baseline - f/u BMETs  8. Hyperkalemia - resolved, K 4.0 yesterday - f/u BMETs  FEN/GI: carb modified diet  DVT px: SCDs  Dispo: d/c to home today.  Discussed with patient the risks of stroke vs re-bleed with anticoagulation.  Patient wishes to continue anticoagulation for stroke prevention for afib with the risk of GI re-bleed.  He will follow up with his PCP on 06/19/13 and will have his INR checked at [facility] on Thursday or Friday.     This is a Psychologist, occupational Note.  The care of the patient  was discussed with Dr. Lorretta Harp and the assessment and plan formulated with their assistance.  Please see their attached note for official documentation of the daily encounter.   LOS: 3 days   Signed: Arna Snipe Internal Medicine, MS4 Pager [phone] 06/16/2013, 3:35 PM

## 2013-06-16 NOTE — Progress Notes (Signed)
Inpatient Diabetes Program Recommendations  AACE/ADA: New Consensus Statement on Inpatient Glycemic Control (2013)  Target Ranges:  Prepandial:   less than 140 mg/dL      Peak postprandial:   less than 180 mg/dL (1-2 hours)      Critically ill patients:  140 - 180 mg/dL   Reason for Visit: Results for VICTORHUGO, PREIS (MRN [id number]) as of 06/16/2013 13:13  Ref. Range 06/15/2013 11:22 06/15/2013 16:54 06/15/2013 21:09 06/16/2013 08:38 06/16/2013 11:43  Glucose-Capillary Latest Range: 70-99 mg/dL 409 (H) 811 (H) 914 (H) 229 (H) 215 (H)   Please consider resuming Lantus 16 units daily (this was home dose).  Beryl Meager, RN, BC-ADM Inpatient Diabetes Coordinator Pager [phone]

## 2013-06-16 NOTE — Progress Notes (Signed)
See my separate progress note.  Japleen Tornow, DO, FACP Faculty [facility] Medicine Residency Program 06/16/2013, 4:27 PM  

## 2013-06-17 NOTE — Discharge Summary (Signed)
  Date: 06/17/2013  Patient name: Brian Martin  Medical record number: [id number]  Date of birth: [birth year]   This patient has been seen and the plan of care was discussed with the house staff. Please see their note for complete details. I concur with their findings and plan.  Jonah Blue, DO, FACP Faculty [facility] Internal Medicine Residency Program 06/17/2013, 11:51 AM

## 2013-06-20 ENCOUNTER — Ambulatory Visit (INDEPENDENT_AMBULATORY_CARE_PROVIDER_SITE_OTHER): Payer: Medicaid Other | Admitting: General Practice

## 2013-06-20 DIAGNOSIS — Z7901 Long term (current) use of anticoagulants: Secondary | ICD-10-CM

## 2013-06-20 DIAGNOSIS — I4891 Unspecified atrial fibrillation: Secondary | ICD-10-CM

## 2013-06-20 LAB — POCT INR: INR: 1.4

## 2013-06-27 ENCOUNTER — Ambulatory Visit (INDEPENDENT_AMBULATORY_CARE_PROVIDER_SITE_OTHER): Payer: Medicaid Other | Admitting: General Practice

## 2013-06-27 DIAGNOSIS — Z7901 Long term (current) use of anticoagulants: Secondary | ICD-10-CM

## 2013-06-27 DIAGNOSIS — I4891 Unspecified atrial fibrillation: Secondary | ICD-10-CM

## 2013-06-27 LAB — POCT INR: INR: 2

## 2013-07-02 ENCOUNTER — Ambulatory Visit (INDEPENDENT_AMBULATORY_CARE_PROVIDER_SITE_OTHER): Payer: Medicaid Other | Admitting: *Deleted

## 2013-07-02 ENCOUNTER — Encounter (HOSPITAL_COMMUNITY): Payer: Self-pay

## 2013-07-02 ENCOUNTER — Ambulatory Visit (HOSPITAL_COMMUNITY)
Admission: RE | Admit: 2013-07-02 | Discharge: 2013-07-02 | Disposition: A | Discharge status: Home / Self Care | Payer: Medicaid Other | Source: Ambulatory Visit | Attending: Internal Medicine | Admitting: Internal Medicine

## 2013-07-02 VITALS — BP 106/70 | HR 76 | Wt 296.8 lb

## 2013-07-02 DIAGNOSIS — Z7901 Long term (current) use of anticoagulants: Secondary | ICD-10-CM

## 2013-07-02 DIAGNOSIS — I4891 Unspecified atrial fibrillation: Secondary | ICD-10-CM

## 2013-07-02 DIAGNOSIS — N183 Chronic kidney disease, stage 3 unspecified: Secondary | ICD-10-CM | POA: Insufficient documentation

## 2013-07-02 DIAGNOSIS — I5022 Chronic systolic (congestive) heart failure: Secondary | ICD-10-CM | POA: Insufficient documentation

## 2013-07-02 DIAGNOSIS — K922 Gastrointestinal hemorrhage, unspecified: Secondary | ICD-10-CM

## 2013-07-02 LAB — BASIC METABOLIC PANEL
BUN: 30 mg/dL — ABNORMAL HIGH (ref 6–23)
CO2: 25 mEq/L (ref 19–32)
Chloride: 97 mEq/L (ref 96–112)
Creatinine, Ser: 2.18 mg/dL — ABNORMAL HIGH (ref 0.50–1.35)
GFR calc non Af Amer: 32 mL/min — ABNORMAL LOW (ref >90)
Potassium: 4.8 mEq/L (ref 3.5–5.1)

## 2013-07-02 MED ORDER — FUROSEMIDE 20 MG PO TABS: 20.0000 mg | ORAL_TABLET | Freq: Every day | ORAL | Status: DC

## 2013-07-02 MED ORDER — HYDRALAZINE HCL 25 MG PO TABS: 12.5000 mg | ORAL_TABLET | Freq: Three times a day (TID) | ORAL | Status: DC

## 2013-07-02 MED ORDER — HYDRALAZINE HCL 25 MG PO TABS: 25.0000 mg | ORAL_TABLET | Freq: Three times a day (TID) | ORAL | Status: DC

## 2013-07-02 NOTE — Progress Notes (Signed)
Patient ID: Brian Martin, male   DOB: [birth year], 57 y.o.   MRN: [id number] PCP: Dr Yetta Flock Nephrologist: [facility]  HPI: Brian Martin is a 57 year old male admitted in 06/2010 with a left foot ulcer complicated by osteomyelitis.  He subsequently underwent left below-the-knee amputation, now with LLE prosthesis (followed by Dr. Lajoyce Corners).  Hospitalization c/b atrial fib and acute systolic HF. ECHO showed a dilated cardiomyopathy with an EF of 25-30% and moderate MR.   TEE no vegetation with an EF of 35%.  He developed acute renal failure secondary to acute tubular necrosis in the setting of sepsis with a Cr of 9.0.     09/12/10: Nuclear stress showed EF 43%.  Mildly decreased uptake in the inferior wall and infero-apex. Suspect soft tissue attenuation but cannot exclude previous infarct. No ischemia.     Repeat echo 12/2010 showed normalized EF. Although cardiomyopathy may be ischemic, with low risk myoviow cath was not pursued with given renal dysfx and absence of anginal sx.  Cr. 2.03 in May 2012.  His amiodarone was stopped in 12/2010 because he was maintaining sinus rhythm, he was continued on coumadin.    08/13/12 ECHO EF 45-50%   Since last appointment here, he was admitted in 10/14 with lower GI bleed thought to be from diverticulosis.  ASA was stopped.  Coumadin was initially held but then restarted at discharge.  Lasix, hydralazine, and amlodipine were stopped due to low blood pressure in the hospital.  Since discharge, he has had no further rectal bleeding.  He has stable dyspnea walking up hills.  No dyspnea walking on flat ground though he is not particularly active.  No chest pain.  No tachypalpitations.  He started developed leg edema off Lasix and it was hard to get his prosthesis on.  He restarted Lasix 20 mg daily and the swelling has gone down.  Weight is currently stable compared to prior appointment.   Labs (10/14): K 4, creatinine 1.55, HCT 25.7  ROS: All systems negative except as  listed in HPI, PMH and Problem List.  Past Medical History  Diagnosis Date  . Atrial fibrillation   . CHF (congestive heart failure)     chronic systolic CHF  . Diabetes mellitus     type 2  . Hypertension   . Chronic renal failure   . Amputation of leg     Current Outpatient Prescriptions  Medication Sig Dispense Refill  . Ascorbic Acid (VITAMIN C) 100 MG tablet Take 100 mg by mouth daily.      . carvedilol (COREG) 25 MG tablet Take 25 mg by mouth 2 (two) times daily with a meal.      . docusate sodium (COLACE) 100 MG capsule Take 1 capsule (100 mg total) by mouth 2 (two) times daily as needed for constipation.  60 capsule  3  . ferrous sulfate 325 (65 FE) MG tablet Take 1 tablet (325 mg total) by mouth daily with breakfast.  30 tablet  5  . furosemide (LASIX) 20 MG tablet Take 1 tablet (20 mg total) by mouth daily.  30 tablet    . insulin glargine (LANTUS) 100 UNIT/ML injection Inject 10 Units into the skin at bedtime.      . isosorbide mononitrate (IMDUR) 60 MG 24 hr tablet Take 60 mg by mouth daily.      . pantoprazole (PROTONIX) 40 MG tablet Take 1 tablet (40 mg total) by mouth daily.  30 tablet  5  . warfarin (COUMADIN) 3 MG  tablet TAKE AS DIRECTED BY COUMADIN CLINIC  50 tablet  3  . hydrALAZINE (APRESOLINE) 25 MG tablet Take 0.5 tablets (12.5 mg total) by mouth 3 (three) times daily.  270 tablet  3   No current facility-administered medications for this encounter.     PHYSICAL EXAM: Filed Vitals:   07/02/13 1103  BP: 106/70  Pulse: 76  Weight: 296 lb 12.8 oz (134.628 kg)  SpO2: 99%   General: Chronically ill appearing, obese, in no acute distress  HEENT: normal Neck: JVP 7 Cardiac:  regular. normal S1, S2; RRR; no murmur Lungs:  clear to auscultation bilaterally, no wheezing, rhonchi or rales Abd: obese, soft, nontender, no hepatomegaly Ext: No edema on RLE.  LLE prothesis in place.    Skin: warm and dry;  Neuro:  CNs 2-12 intact, no focal abnormalities  note  Assessment/Plan: 1. Nonischemic cardiomyopathy: Now primarily diastolic CHF with last EF 45-50%.  Minimal edema since restarting Lasix.  He does not appear particularly volume overloaded today.  - Continue Lasix 20 mg daily.  BMET today.  - Restart hydralazine 12.5 mg tid (lower dose with soft blood pressure) and continue  Imdur.   - Continue Coreg.  2. Atrial fibrillation: Paroxysmal.  He is in NSR today.  He is no longer on amiodarone.  He is back on coumadin after diverticular bleed.  For now, would aim for INR 2-2.5.  He has stopped aspirin.  3. GI bleeding: Suspect from diverticulosis.  No further rectal bleeding.  Check CBC.   4. CKD: As above, will get BMET since Lasix was restarted.   Marca Ancona 07/02/2013

## 2013-07-02 NOTE — Patient Instructions (Signed)
Start Hydralazine 25 mg 1/2 tab daily  Start Isosorbide 60 mg daily  Start Furosemide 20 mg daily  Labs today  We will contact you in 2 months to schedule your next appointment.

## 2013-07-03 ENCOUNTER — Encounter (HOSPITAL_COMMUNITY): Payer: Medicaid Other

## 2013-07-03 ENCOUNTER — Telehealth (HOSPITAL_COMMUNITY): Payer: Self-pay | Admitting: *Deleted

## 2013-07-03 DIAGNOSIS — I5022 Chronic systolic (congestive) heart failure: Secondary | ICD-10-CM

## 2013-07-03 MED ORDER — FUROSEMIDE 20 MG PO TABS: 20.0000 mg | ORAL_TABLET | ORAL | Status: DC

## 2013-07-03 NOTE — Telephone Encounter (Signed)
Message copied by Noralee Space on Thu Jul 03, 2013  2:33 PM ------      Message from: Laurey Morale      Created: Thu Jul 03, 2013  9:26 AM       Creatinine high, try going to every other day Lasix and repeat BMET in 2 wks. ------

## 2013-07-03 NOTE — Telephone Encounter (Signed)
Pt aware, will check bmet 11/19

## 2013-07-16 ENCOUNTER — Other Ambulatory Visit: Payer: Medicaid Other

## 2013-07-17 ENCOUNTER — Ambulatory Visit (INDEPENDENT_AMBULATORY_CARE_PROVIDER_SITE_OTHER): Payer: Medicaid Other | Admitting: General Practice

## 2013-07-17 ENCOUNTER — Other Ambulatory Visit (INDEPENDENT_AMBULATORY_CARE_PROVIDER_SITE_OTHER): Payer: Medicaid Other

## 2013-07-17 DIAGNOSIS — I4891 Unspecified atrial fibrillation: Secondary | ICD-10-CM

## 2013-07-17 DIAGNOSIS — I5022 Chronic systolic (congestive) heart failure: Secondary | ICD-10-CM

## 2013-07-17 DIAGNOSIS — Z7901 Long term (current) use of anticoagulants: Secondary | ICD-10-CM

## 2013-07-17 LAB — POCT INR: INR: 1.9

## 2013-07-17 LAB — BASIC METABOLIC PANEL
BUN: 26 mg/dL — ABNORMAL HIGH (ref 6–23)
CO2: 25 mEq/L (ref 19–32)
Chloride: 101 mEq/L (ref 96–112)
Creatinine, Ser: 1.9 mg/dL — ABNORMAL HIGH (ref 0.4–1.5)

## 2013-08-07 ENCOUNTER — Ambulatory Visit (INDEPENDENT_AMBULATORY_CARE_PROVIDER_SITE_OTHER): Payer: Medicaid Other | Admitting: General Practice

## 2013-08-07 DIAGNOSIS — I4891 Unspecified atrial fibrillation: Secondary | ICD-10-CM

## 2013-08-07 DIAGNOSIS — Z7901 Long term (current) use of anticoagulants: Secondary | ICD-10-CM

## 2013-08-07 LAB — POCT INR: INR: 1.4

## 2013-08-19 ENCOUNTER — Other Ambulatory Visit: Payer: Self-pay | Admitting: *Deleted

## 2013-08-19 MED ORDER — WARFARIN SODIUM 3 MG PO TABS: 3.0000 mg | ORAL_TABLET | ORAL | Status: DC

## 2013-08-25 ENCOUNTER — Ambulatory Visit (INDEPENDENT_AMBULATORY_CARE_PROVIDER_SITE_OTHER): Payer: Medicaid Other

## 2013-08-25 DIAGNOSIS — I4891 Unspecified atrial fibrillation: Secondary | ICD-10-CM

## 2013-08-25 DIAGNOSIS — Z7901 Long term (current) use of anticoagulants: Secondary | ICD-10-CM

## 2013-08-25 LAB — POCT INR: INR: 1.3

## 2013-09-12 ENCOUNTER — Ambulatory Visit (INDEPENDENT_AMBULATORY_CARE_PROVIDER_SITE_OTHER): Payer: Medicaid Other | Admitting: *Deleted

## 2013-09-12 DIAGNOSIS — I4891 Unspecified atrial fibrillation: Secondary | ICD-10-CM

## 2013-09-12 DIAGNOSIS — Z7901 Long term (current) use of anticoagulants: Secondary | ICD-10-CM

## 2013-09-12 LAB — POCT INR: INR: 2.1

## 2013-09-26 ENCOUNTER — Ambulatory Visit (INDEPENDENT_AMBULATORY_CARE_PROVIDER_SITE_OTHER): Payer: Medicaid Other | Admitting: Pharmacist

## 2013-09-26 DIAGNOSIS — Z7901 Long term (current) use of anticoagulants: Secondary | ICD-10-CM

## 2013-09-26 DIAGNOSIS — I4891 Unspecified atrial fibrillation: Secondary | ICD-10-CM

## 2013-09-26 LAB — POCT INR: INR: 2

## 2013-10-31 ENCOUNTER — Ambulatory Visit (INDEPENDENT_AMBULATORY_CARE_PROVIDER_SITE_OTHER): Payer: Medicaid Other | Admitting: *Deleted

## 2013-10-31 DIAGNOSIS — Z7901 Long term (current) use of anticoagulants: Secondary | ICD-10-CM

## 2013-10-31 DIAGNOSIS — I4891 Unspecified atrial fibrillation: Secondary | ICD-10-CM

## 2013-10-31 LAB — POCT INR: INR: 1.7

## 2013-11-21 ENCOUNTER — Ambulatory Visit (INDEPENDENT_AMBULATORY_CARE_PROVIDER_SITE_OTHER): Payer: Medicaid Other | Admitting: *Deleted

## 2013-11-21 DIAGNOSIS — I4891 Unspecified atrial fibrillation: Secondary | ICD-10-CM

## 2013-11-21 DIAGNOSIS — Z5181 Encounter for therapeutic drug level monitoring: Secondary | ICD-10-CM | POA: Insufficient documentation

## 2013-11-21 DIAGNOSIS — Z7901 Long term (current) use of anticoagulants: Secondary | ICD-10-CM

## 2013-11-21 LAB — POCT INR: INR: 1.9

## 2013-12-05 ENCOUNTER — Ambulatory Visit (INDEPENDENT_AMBULATORY_CARE_PROVIDER_SITE_OTHER): Payer: Medicaid Other | Admitting: Pharmacist

## 2013-12-05 DIAGNOSIS — Z7901 Long term (current) use of anticoagulants: Secondary | ICD-10-CM

## 2013-12-05 DIAGNOSIS — I4891 Unspecified atrial fibrillation: Secondary | ICD-10-CM

## 2013-12-05 DIAGNOSIS — Z5181 Encounter for therapeutic drug level monitoring: Secondary | ICD-10-CM

## 2013-12-05 LAB — POCT INR: INR: 1.8

## 2013-12-13 ENCOUNTER — Other Ambulatory Visit: Payer: Self-pay | Admitting: Internal Medicine

## 2013-12-18 ENCOUNTER — Other Ambulatory Visit: Payer: Self-pay | Admitting: Internal Medicine

## 2013-12-19 ENCOUNTER — Ambulatory Visit (INDEPENDENT_AMBULATORY_CARE_PROVIDER_SITE_OTHER): Payer: Medicaid Other | Admitting: Pharmacist

## 2013-12-19 DIAGNOSIS — I4891 Unspecified atrial fibrillation: Secondary | ICD-10-CM

## 2013-12-19 DIAGNOSIS — Z5181 Encounter for therapeutic drug level monitoring: Secondary | ICD-10-CM

## 2013-12-19 DIAGNOSIS — Z7901 Long term (current) use of anticoagulants: Secondary | ICD-10-CM

## 2013-12-19 LAB — POCT INR: INR: 2.5

## 2014-01-09 ENCOUNTER — Ambulatory Visit (INDEPENDENT_AMBULATORY_CARE_PROVIDER_SITE_OTHER): Payer: Medicaid Other | Admitting: *Deleted

## 2014-01-09 DIAGNOSIS — Z7901 Long term (current) use of anticoagulants: Secondary | ICD-10-CM

## 2014-01-09 DIAGNOSIS — I4891 Unspecified atrial fibrillation: Secondary | ICD-10-CM

## 2014-01-09 DIAGNOSIS — Z5181 Encounter for therapeutic drug level monitoring: Secondary | ICD-10-CM

## 2014-01-09 LAB — POCT INR: INR: 2.4

## 2014-01-09 MED ORDER — WARFARIN SODIUM 3 MG PO TABS: 3.0000 mg | ORAL_TABLET | ORAL | Status: DC

## 2014-02-06 ENCOUNTER — Ambulatory Visit (INDEPENDENT_AMBULATORY_CARE_PROVIDER_SITE_OTHER): Payer: Medicaid Other | Admitting: *Deleted

## 2014-02-06 DIAGNOSIS — I4891 Unspecified atrial fibrillation: Secondary | ICD-10-CM

## 2014-02-06 DIAGNOSIS — Z5181 Encounter for therapeutic drug level monitoring: Secondary | ICD-10-CM

## 2014-02-06 DIAGNOSIS — Z7901 Long term (current) use of anticoagulants: Secondary | ICD-10-CM

## 2014-02-06 LAB — POCT INR: INR: 2.5

## 2014-03-27 ENCOUNTER — Ambulatory Visit (INDEPENDENT_AMBULATORY_CARE_PROVIDER_SITE_OTHER): Payer: Medicaid Other | Admitting: Pharmacist

## 2014-03-27 DIAGNOSIS — Z7901 Long term (current) use of anticoagulants: Secondary | ICD-10-CM

## 2014-03-27 DIAGNOSIS — I4891 Unspecified atrial fibrillation: Secondary | ICD-10-CM

## 2014-03-27 DIAGNOSIS — Z5181 Encounter for therapeutic drug level monitoring: Secondary | ICD-10-CM

## 2014-03-27 LAB — POCT INR: INR: 3.2

## 2014-03-28 ENCOUNTER — Encounter: Payer: Self-pay | Admitting: Cardiology

## 2014-04-29 ENCOUNTER — Ambulatory Visit (INDEPENDENT_AMBULATORY_CARE_PROVIDER_SITE_OTHER): Payer: Medicaid Other | Admitting: *Deleted

## 2014-04-29 DIAGNOSIS — Z7901 Long term (current) use of anticoagulants: Secondary | ICD-10-CM

## 2014-04-29 DIAGNOSIS — Z5181 Encounter for therapeutic drug level monitoring: Secondary | ICD-10-CM

## 2014-04-29 DIAGNOSIS — I4891 Unspecified atrial fibrillation: Secondary | ICD-10-CM

## 2014-04-29 LAB — POCT INR: INR: 2.6

## 2014-05-14 ENCOUNTER — Other Ambulatory Visit: Payer: Self-pay | Admitting: *Deleted

## 2014-05-14 MED ORDER — WARFARIN SODIUM 3 MG PO TABS: 3.0000 mg | ORAL_TABLET | ORAL | Status: DC

## 2014-05-19 ENCOUNTER — Other Ambulatory Visit: Payer: Self-pay | Admitting: *Deleted

## 2014-05-19 MED ORDER — WARFARIN SODIUM 3 MG PO TABS: 3.0000 mg | ORAL_TABLET | ORAL | Status: DC

## 2014-06-09 ENCOUNTER — Ambulatory Visit (INDEPENDENT_AMBULATORY_CARE_PROVIDER_SITE_OTHER): Payer: Medicaid Other

## 2014-06-09 DIAGNOSIS — I4891 Unspecified atrial fibrillation: Secondary | ICD-10-CM

## 2014-06-09 DIAGNOSIS — Z7901 Long term (current) use of anticoagulants: Secondary | ICD-10-CM

## 2014-06-09 DIAGNOSIS — Z5181 Encounter for therapeutic drug level monitoring: Secondary | ICD-10-CM

## 2014-06-09 LAB — POCT INR: INR: 3

## 2014-06-27 ENCOUNTER — Other Ambulatory Visit: Payer: Self-pay

## 2014-06-27 MED ORDER — HYDRALAZINE HCL 25 MG PO TABS: 12.5000 mg | ORAL_TABLET | Freq: Three times a day (TID) | ORAL | Status: DC

## 2014-06-29 ENCOUNTER — Telehealth (HOSPITAL_COMMUNITY): Payer: Self-pay | Admitting: Vascular Surgery

## 2014-06-29 DIAGNOSIS — I5022 Chronic systolic (congestive) heart failure: Secondary | ICD-10-CM

## 2014-06-29 MED ORDER — HYDRALAZINE HCL 25 MG PO TABS: 25.0000 mg | ORAL_TABLET | Freq: Three times a day (TID) | ORAL | Status: DC

## 2014-06-29 NOTE — Telephone Encounter (Signed)
Pt requested refill on hydralazine  clarification on medication sent into pharmacy Pt was taking 25 mg BID instead on 12.5 mg TID as ordered by Dr.McLean  Per Deatra Canter Cosgrove,NP Pt should change hydralazine to 25mg  TID, if pt starts to get dizzy or lightheaded can decrease back to 12.5 mg TID Pt aware and voiced understanding Pt overdue for follow up, appt made for pt as well

## 2014-06-29 NOTE — Telephone Encounter (Signed)
Refill Hydralazine.

## 2014-07-03 ENCOUNTER — Ambulatory Visit (HOSPITAL_COMMUNITY)
Admission: RE | Admit: 2014-07-03 | Discharge: 2014-07-03 | Disposition: A | Discharge status: Home / Self Care | Payer: Medicaid Other | Source: Ambulatory Visit | Attending: Cardiology | Admitting: Cardiology

## 2014-07-03 ENCOUNTER — Telehealth (HOSPITAL_COMMUNITY): Payer: Self-pay | Admitting: Anesthesiology

## 2014-07-03 ENCOUNTER — Encounter (HOSPITAL_COMMUNITY): Payer: Self-pay

## 2014-07-03 VITALS — BP 137/81 | HR 63 | Resp 18 | Wt 275.5 lb

## 2014-07-03 DIAGNOSIS — I5032 Chronic diastolic (congestive) heart failure: Secondary | ICD-10-CM | POA: Diagnosis present

## 2014-07-03 DIAGNOSIS — Z7901 Long term (current) use of anticoagulants: Secondary | ICD-10-CM

## 2014-07-03 DIAGNOSIS — N183 Chronic kidney disease, stage 3 unspecified: Secondary | ICD-10-CM

## 2014-07-03 DIAGNOSIS — I480 Paroxysmal atrial fibrillation: Secondary | ICD-10-CM | POA: Insufficient documentation

## 2014-07-03 DIAGNOSIS — I5022 Chronic systolic (congestive) heart failure: Secondary | ICD-10-CM

## 2014-07-03 LAB — BASIC METABOLIC PANEL
Anion gap: 13 (ref 5–15)
BUN: 26 mg/dL — AB (ref 6–23)
CALCIUM: 8.8 mg/dL (ref 8.4–10.5)
CO2: 23 mEq/L (ref 19–32)
Chloride: 102 mEq/L (ref 96–112)
Creatinine, Ser: 1.44 mg/dL — ABNORMAL HIGH (ref 0.50–1.35)
GFR calc non Af Amer: 52 mL/min — ABNORMAL LOW (ref >90)
GFR, EST AFRICAN AMERICAN: 60 mL/min — AB (ref >90)
GLUCOSE: 122 mg/dL — AB (ref 70–99)
Potassium: 5.1 mEq/L (ref 3.7–5.3)
SODIUM: 138 meq/L (ref 137–147)

## 2014-07-03 LAB — PRO B NATRIURETIC PEPTIDE: Pro B Natriuretic peptide (BNP): 455.5 pg/mL — ABNORMAL HIGH (ref 0–125)

## 2014-07-03 MED ORDER — FUROSEMIDE 20 MG PO TABS: 20.0000 mg | ORAL_TABLET | Freq: Every day | ORAL | Status: AC

## 2014-07-03 MED ORDER — HYDRALAZINE HCL 25 MG PO TABS: 25.0000 mg | ORAL_TABLET | Freq: Three times a day (TID) | ORAL | Status: DC

## 2014-07-03 NOTE — Progress Notes (Signed)
Patient ID: Brian Martin, male   DOB: Dec 02, 1955, 58 y.o.   MRN: [id number]  PCP: Dr Brian Martin Nephrologist: [facility]  HPI: Brian Martin is a 58 year old male admitted in 06/2010 with a left foot ulcer complicated by osteomyelitis.  He subsequently underwent left below-the-knee amputation, now with LLE prosthesis (followed by Dr. Sharol Martin).  Hospitalization c/b atrial fib and acute systolic HF. ECHO showed a dilated cardiomyopathy with an EF of 25-30% and moderate MR.   TEE no vegetation with an EF of 35%.  He developed acute renal failure secondary to acute tubular necrosis in the setting of sepsis with a Cr of 9.0.     09/12/10: Nuclear stress showed EF 43%.  Mildly decreased uptake in the inferior wall and infero-apex. Suspect soft tissue attenuation but cannot exclude previous infarct. No ischemia.     Repeat echo 12/2010 showed normalized EF. Although cardiomyopathy may be ischemic, with low risk myoviow cath was not pursued with Martin renal dysfx and absence of anginal sx.  Cr. 2.03 in May 2012.  His amiodarone was stopped in 12/2010 because he was maintaining sinus rhythm, he was continued on coumadin.    08/13/12 ECHO EF 45-50%   Follow up for Heart Failure: Has not been seen in over a year. Last visit was told to take hydralazine 12.5 mg TID, however he has been taking 25 mg TID. Been doing well. Denies SOB, PND, orthopnea or CP. Diabetes more controlled. Mild dizziness with moving head quickly. Has to stop walking d/t leg gets tired not SOB. Working on going up stairs. Able to walk around the whole grocery store with no issues. Weight at home trending down and does not weigh daily. Trying to follow a low salt diet and drinking more than 2L a day.   Labs (10/14): K 4, creatinine 1.55, HCT 25.7  ROS: All systems negative except as listed in HPI, PMH and Problem List.  Past Medical History  Diagnosis Date  . Atrial fibrillation   . CHF (congestive heart failure)     chronic systolic CHF  .  Diabetes mellitus     type 2  . Hypertension   . Chronic renal failure   . Amputation of leg     Current Outpatient Prescriptions  Medication Sig Dispense Refill  . Ascorbic Acid (VITAMIN C) 100 MG tablet Take 100 mg by mouth daily.    . carvedilol (COREG) 25 MG tablet Take 25 mg by mouth 2 (two) times daily with a meal.    . docusate sodium (COLACE) 100 MG capsule Take 1 capsule (100 mg total) by mouth 2 (two) times daily as needed for constipation. 60 capsule 3  . ferrous sulfate 325 (65 FE) MG tablet Take 1 tablet (325 mg total) by mouth daily with breakfast. 30 tablet 5  . furosemide (LASIX) 20 MG tablet Take 1 tablet (20 mg total) by mouth every other day. (Patient taking differently: Take 20 mg by mouth daily. ) 30 tablet   . insulin glargine (LANTUS) 100 UNIT/ML injection Inject 10 Units into the skin at bedtime.    . isosorbide mononitrate (IMDUR) 60 MG 24 hr tablet Take 60 mg by mouth daily.    . pantoprazole (PROTONIX) 40 MG tablet Take 1 tablet (40 mg total) by mouth daily. 30 tablet 5  . warfarin (COUMADIN) 3 MG tablet Take 1 tablet (3 mg total) by mouth as directed. 50 tablet 3  . hydrALAZINE (APRESOLINE) 25 MG tablet Take 1 tablet (25 mg total) by  mouth 3 (three) times daily. 90 tablet 0   No current facility-administered medications for this encounter.    Filed Vitals:   07/03/14 0942  BP: 137/81  Pulse: 63  Resp: 18  Weight: 275 lb 8 oz (124.966 kg)  SpO2: 97%    PHYSICAL EXAM: General: Elderly, obese, in no acute distress  HEENT: normal Neck: JVP flat Cardiac:  regular. normal S1, S2; RRR; no murmur Lungs:  clear to auscultation bilaterally, no wheezing, rhonchi or rales Abd: obese, soft, nontender, no hepatomegaly Ext: No edema on RLE.  LLE prothesis in place.    Skin: warm and dry;  Neuro:  CNs 2-12 intact, no focal abnormalities note  Assessment/Plan: 1. Nonischemic cardiomyopathy: Now primarily diastolic CHF with last EF 45-50%.   - NYHA II symptoms  and volume status stable. Will continue lasix 20 mg daily. Check BMET and pro-BNP daily.  Continue current dose coreg, hydralazine and Imdur.  - Repeat ECHO in 4 months - Reinforced the need and importance of daily weights, a low sodium diet, and fluid restriction (less than 2 L a day). Instructed to call the HF clinic if weight increases more than 3 lbs overnight or 5 lbs in a week.  2. Atrial fibrillation: Paroxysmal.  He appears in NSR today. Continue Coumadin daily which is managed by [facility]. Had diverticular bleed in the past but doing well.  3. CKD stage III : - Baseline creatinine 1.4-1.9. Check BMET today.   F/U 4 months Rande Brunt NP-C 07/03/2014

## 2014-07-03 NOTE — Telephone Encounter (Signed)
Reviewed labs and pro-BNP slightly elevated. Take extra 20 mg lasix today and tomorrow. Patient aware.

## 2014-07-03 NOTE — Patient Instructions (Signed)
Doing great.  Call any issues.  Start weighing daily.  Follow up in 4 months with ECHO  Do the following things EVERYDAY: 1) Weigh yourself in the morning before breakfast. Write it down and keep it in a log. 2) Take your medicines as prescribed 3) Eat low salt foods-Limit salt (sodium) to 2000 mg per day.  4) Stay as active as you can everyday 5) Limit all fluids for the day to less than 2 liters 6)

## 2014-07-06 ENCOUNTER — Telehealth (HOSPITAL_COMMUNITY): Payer: Self-pay

## 2014-07-06 NOTE — Telephone Encounter (Signed)
error 

## 2014-07-17 ENCOUNTER — Ambulatory Visit (INDEPENDENT_AMBULATORY_CARE_PROVIDER_SITE_OTHER): Payer: Medicaid Other | Admitting: *Deleted

## 2014-07-17 DIAGNOSIS — I4891 Unspecified atrial fibrillation: Secondary | ICD-10-CM

## 2014-07-17 DIAGNOSIS — Z5181 Encounter for therapeutic drug level monitoring: Secondary | ICD-10-CM

## 2014-07-17 DIAGNOSIS — Z7901 Long term (current) use of anticoagulants: Secondary | ICD-10-CM

## 2014-07-17 DIAGNOSIS — I480 Paroxysmal atrial fibrillation: Secondary | ICD-10-CM

## 2014-07-17 LAB — POCT INR: INR: 2.7

## 2014-08-27 ENCOUNTER — Ambulatory Visit (INDEPENDENT_AMBULATORY_CARE_PROVIDER_SITE_OTHER): Payer: Medicaid Other | Admitting: *Deleted

## 2014-08-27 DIAGNOSIS — Z7901 Long term (current) use of anticoagulants: Secondary | ICD-10-CM

## 2014-08-27 DIAGNOSIS — I480 Paroxysmal atrial fibrillation: Secondary | ICD-10-CM

## 2014-08-27 DIAGNOSIS — Z5181 Encounter for therapeutic drug level monitoring: Secondary | ICD-10-CM

## 2014-08-27 DIAGNOSIS — I4891 Unspecified atrial fibrillation: Secondary | ICD-10-CM

## 2014-08-27 LAB — POCT INR: INR: 2.2

## 2014-10-15 ENCOUNTER — Other Ambulatory Visit: Payer: Self-pay | Admitting: *Deleted

## 2014-10-15 MED ORDER — WARFARIN SODIUM 3 MG PO TABS: 3.0000 mg | ORAL_TABLET | ORAL | Status: DC

## 2014-10-23 ENCOUNTER — Ambulatory Visit (INDEPENDENT_AMBULATORY_CARE_PROVIDER_SITE_OTHER): Payer: Medicaid Other | Admitting: *Deleted

## 2014-10-23 DIAGNOSIS — I480 Paroxysmal atrial fibrillation: Secondary | ICD-10-CM

## 2014-10-23 DIAGNOSIS — Z5181 Encounter for therapeutic drug level monitoring: Secondary | ICD-10-CM

## 2014-10-23 DIAGNOSIS — I4891 Unspecified atrial fibrillation: Secondary | ICD-10-CM

## 2014-10-23 DIAGNOSIS — Z7901 Long term (current) use of anticoagulants: Secondary | ICD-10-CM

## 2014-10-23 LAB — POCT INR: INR: 2.5

## 2014-12-04 ENCOUNTER — Ambulatory Visit (INDEPENDENT_AMBULATORY_CARE_PROVIDER_SITE_OTHER): Payer: Medicaid Other

## 2014-12-04 DIAGNOSIS — I4891 Unspecified atrial fibrillation: Secondary | ICD-10-CM

## 2014-12-04 DIAGNOSIS — Z5181 Encounter for therapeutic drug level monitoring: Secondary | ICD-10-CM

## 2014-12-04 DIAGNOSIS — Z7901 Long term (current) use of anticoagulants: Secondary | ICD-10-CM | POA: Diagnosis not present

## 2014-12-04 LAB — POCT INR: INR: 2.8

## 2015-01-15 ENCOUNTER — Ambulatory Visit (INDEPENDENT_AMBULATORY_CARE_PROVIDER_SITE_OTHER): Payer: Medicaid Other | Admitting: *Deleted

## 2015-01-15 DIAGNOSIS — Z7901 Long term (current) use of anticoagulants: Secondary | ICD-10-CM | POA: Diagnosis not present

## 2015-01-15 DIAGNOSIS — Z5181 Encounter for therapeutic drug level monitoring: Secondary | ICD-10-CM

## 2015-01-15 DIAGNOSIS — I4891 Unspecified atrial fibrillation: Secondary | ICD-10-CM | POA: Diagnosis not present

## 2015-01-15 LAB — POCT INR: INR: 2.6

## 2015-02-26 ENCOUNTER — Ambulatory Visit (INDEPENDENT_AMBULATORY_CARE_PROVIDER_SITE_OTHER): Payer: Medicaid Other | Admitting: *Deleted

## 2015-02-26 DIAGNOSIS — I4891 Unspecified atrial fibrillation: Secondary | ICD-10-CM

## 2015-02-26 DIAGNOSIS — Z7901 Long term (current) use of anticoagulants: Secondary | ICD-10-CM

## 2015-02-26 DIAGNOSIS — Z5181 Encounter for therapeutic drug level monitoring: Secondary | ICD-10-CM

## 2015-02-26 LAB — POCT INR: INR: 2.6

## 2015-03-14 ENCOUNTER — Other Ambulatory Visit (HOSPITAL_COMMUNITY): Payer: Self-pay | Admitting: Anesthesiology

## 2015-03-16 ENCOUNTER — Encounter (HOSPITAL_COMMUNITY): Payer: Self-pay | Admitting: Vascular Surgery

## 2015-03-16 ENCOUNTER — Telehealth (HOSPITAL_COMMUNITY): Payer: Self-pay | Admitting: Vascular Surgery

## 2015-03-18 NOTE — Telephone Encounter (Signed)
OPEN IN ERROR 

## 2015-04-09 ENCOUNTER — Ambulatory Visit (INDEPENDENT_AMBULATORY_CARE_PROVIDER_SITE_OTHER): Payer: Medicaid Other | Admitting: Pharmacist

## 2015-04-09 ENCOUNTER — Other Ambulatory Visit (HOSPITAL_COMMUNITY): Payer: Self-pay | Admitting: Anesthesiology

## 2015-04-09 DIAGNOSIS — Z5181 Encounter for therapeutic drug level monitoring: Secondary | ICD-10-CM

## 2015-04-09 DIAGNOSIS — I4891 Unspecified atrial fibrillation: Secondary | ICD-10-CM

## 2015-04-09 DIAGNOSIS — Z7901 Long term (current) use of anticoagulants: Secondary | ICD-10-CM

## 2015-04-09 LAB — POCT INR: INR: 1.9

## 2015-04-12 ENCOUNTER — Other Ambulatory Visit (HOSPITAL_COMMUNITY): Payer: Self-pay | Admitting: Anesthesiology

## 2015-04-13 ENCOUNTER — Telehealth (HOSPITAL_COMMUNITY): Payer: Self-pay | Admitting: Vascular Surgery

## 2015-04-13 NOTE — Telephone Encounter (Signed)
Pt is scheduled °

## 2015-05-05 ENCOUNTER — Ambulatory Visit (HOSPITAL_COMMUNITY)
Admission: RE | Admit: 2015-05-05 | Discharge: 2015-05-05 | Disposition: A | Discharge status: Home / Self Care | Payer: Medicaid Other | Source: Ambulatory Visit | Attending: Internal Medicine | Admitting: Internal Medicine

## 2015-05-05 ENCOUNTER — Encounter (HOSPITAL_COMMUNITY): Payer: Self-pay

## 2015-05-05 ENCOUNTER — Ambulatory Visit (HOSPITAL_BASED_OUTPATIENT_CLINIC_OR_DEPARTMENT_OTHER)
Admission: RE | Admit: 2015-05-05 | Discharge: 2015-05-05 | Disposition: A | Discharge status: Home / Self Care | Payer: Medicaid Other | Source: Ambulatory Visit | Attending: Internal Medicine | Admitting: Internal Medicine

## 2015-05-05 VITALS — BP 124/76 | HR 80 | Wt 294.5 lb

## 2015-05-05 DIAGNOSIS — I129 Hypertensive chronic kidney disease with stage 1 through stage 4 chronic kidney disease, or unspecified chronic kidney disease: Secondary | ICD-10-CM | POA: Diagnosis not present

## 2015-05-05 DIAGNOSIS — E1122 Type 2 diabetes mellitus with diabetic chronic kidney disease: Secondary | ICD-10-CM | POA: Diagnosis not present

## 2015-05-05 DIAGNOSIS — I5032 Chronic diastolic (congestive) heart failure: Secondary | ICD-10-CM | POA: Insufficient documentation

## 2015-05-05 DIAGNOSIS — Z79899 Other long term (current) drug therapy: Secondary | ICD-10-CM | POA: Insufficient documentation

## 2015-05-05 DIAGNOSIS — I5030 Unspecified diastolic (congestive) heart failure: Secondary | ICD-10-CM | POA: Diagnosis not present

## 2015-05-05 DIAGNOSIS — Z7901 Long term (current) use of anticoagulants: Secondary | ICD-10-CM | POA: Diagnosis not present

## 2015-05-05 DIAGNOSIS — Z794 Long term (current) use of insulin: Secondary | ICD-10-CM | POA: Diagnosis not present

## 2015-05-05 DIAGNOSIS — Z89512 Acquired absence of left leg below knee: Secondary | ICD-10-CM | POA: Insufficient documentation

## 2015-05-05 DIAGNOSIS — N183 Chronic kidney disease, stage 3 unspecified: Secondary | ICD-10-CM

## 2015-05-05 DIAGNOSIS — I428 Other cardiomyopathies: Secondary | ICD-10-CM | POA: Insufficient documentation

## 2015-05-05 DIAGNOSIS — I480 Paroxysmal atrial fibrillation: Secondary | ICD-10-CM | POA: Insufficient documentation

## 2015-05-05 DIAGNOSIS — I5022 Chronic systolic (congestive) heart failure: Secondary | ICD-10-CM | POA: Diagnosis not present

## 2015-05-05 NOTE — Progress Notes (Signed)
Patient ID: Brian Martin, male   DOB: 16-Jul-1956, 59 y.o.   MRN: [id number] PCP: Dr Maryella Shivers Nephrologist: [facility]  HPI: Brian Martin is a 59 year old male admitted in 06/2010 with a left foot ulcer complicated by osteomyelitis.  Brian Martin subsequently underwent left below-the-knee amputation, now with LLE prosthesis (followed by Dr. Sharol Given).  Hospitalization c/b atrial fib and acute systolic HF. ECHO showed a dilated cardiomyopathy with an EF of 25-30% and moderate MR.   TEE no vegetation with an EF of 35%.  Brian Martin developed acute renal failure secondary to acute tubular necrosis in the setting of sepsis with a Cr of 9.0.     09/12/10: Nuclear stress showed EF 43%.  Mildly decreased uptake in the inferior wall and infero-apex. Suspect soft tissue attenuation but cannot exclude previous infarct. No ischemia.     Repeat echo 12/2010 showed normalized EF. Although cardiomyopathy may be ischemic, with low risk myoviow cath was not pursued with given renal dysfx and absence of anginal sx.  Cr. 2.03 in May 2012.  His amiodarone was stopped in 12/2010 because Brian Martin was maintaining sinus rhythm, Brian Martin was continued on coumadin.    08/13/12 ECHO EF 45-50%   Follow up for Heart Failure: Last seen in 11/15.  Seems to be doing well.  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  Brian Martin is short of breath walking up hills or with a long walk.  No chest pain.  No orthopnea/PND.  No tachypalpitations.  No BRBPR or melena on couamdin.  Echo was done today, EF 55% with normal RV.  ECG: NSR, 1st degree AVB, poor RWP  Labs (10/14): K 4, creatinine 1.55, HCT 25.7 Labs (11/15): K 5.1, creatinine 1.44, BNP 455  ROS: All systems negative except as listed in HPI, PMH and Problem List.  Past Medical History  Diagnosis Date  . Atrial fibrillation   . CHF (congestive heart failure)     chronic systolic CHF  . Diabetes mellitus     type 2  . Hypertension   . Chronic renal failure   . Amputation of leg     Current  Outpatient Prescriptions  Medication Sig Dispense Refill  . Ascorbic Acid (VITAMIN C) 100 MG tablet Take 100 mg by mouth daily.    . carvedilol (COREG) 25 MG tablet Take 25 mg by mouth 2 (two) times daily with a meal.    . docusate sodium (COLACE) 100 MG capsule Take 1 capsule (100 mg total) by mouth 2 (two) times daily as needed for constipation. 60 capsule 3  . ferrous sulfate 325 (65 FE) MG tablet Take 1 tablet (325 mg total) by mouth daily with breakfast. 30 tablet 5  . furosemide (LASIX) 20 MG tablet Take 1 tablet (20 mg total) by mouth daily. 30 tablet 6  . hydrALAZINE (APRESOLINE) 25 MG tablet TAKE ONE TABLET BY MOUTH THREE TIMES DAILY 90 tablet 0  . insulin glargine (LANTUS) 100 UNIT/ML injection Inject 25 Units into the skin at bedtime.     . isosorbide mononitrate (IMDUR) 60 MG 24 hr tablet Take 60 mg by mouth daily.    . pantoprazole (PROTONIX) 40 MG tablet Take 1 tablet (40 mg total) by mouth daily. 30 tablet 5  . warfarin (COUMADIN) 3 MG tablet Take 1 tablet (3 mg total) by mouth as directed. 50 tablet 3   No current facility-administered medications for this encounter.    Filed Vitals:   05/05/15 1151  BP: 124/76  Pulse: 80  Weight: 294  lb 8 oz (133.584 kg)  SpO2: 97%    PHYSICAL EXAM: General: Elderly, obese, in no acute distress  HEENT: normal Neck: JVP flat Cardiac:  regular. normal S1, S2; RRR; no murmur Lungs:  clear to auscultation bilaterally, no wheezing, rhonchi or rales Abd: obese, soft, nontender, no hepatomegaly Ext: No edema on RLE.  LLE prothesis in place.    Skin: warm and dry;  Neuro:  CNs 2-12 intact, no focal abnormalities note  Assessment/Plan: 1. Nonischemic cardiomyopathy: Now diastolic CHF with EF improved to 55% on today's echo.  RV function also appears normal.  NYHA class II symptoms, not volume overloaded.    - Will continue lasix 20 mg daily.   - Continue current dose coreg, hydralazine and Imdur.  - Brian Martin will have labs with PCP tomorrow,  will ask that they be sure to draw BMET and fax it up to our office.  - Reinforced the need and importance of daily weights, a low sodium diet, and fluid restriction (less than 2 L a day). Instructed to call the HF clinic if weight increases more than 3 lbs overnight or 5 lbs in a week.  2. Atrial fibrillation: Paroxysmal.  Brian Martin is in NSR today. Continue Coumadin daily which is managed by [facility]. Had diverticular bleed in the past but no recent evidence for GI bleeding. Will make sure PCP draws CBC tomorrow and faxes here.   3. CKD stage III :  Baseline creatinine 1.4-1.9. Await BMET to be drawn tomorrow.   Brian Martin can followup in 1 year.   Loralie Champagne 05/05/2015

## 2015-05-05 NOTE — Progress Notes (Signed)
  Echocardiogram 2D Echocardiogram has been performed.  Darlina Sicilian M 05/05/2015, 11:41 AM

## 2015-05-05 NOTE — Patient Instructions (Signed)
Have labs drawn at appointment tomorrow with Dr.Hodges.  Please have them fax results to: [phone]  FOLLOW UP: 1 year

## 2015-05-05 NOTE — Progress Notes (Signed)
Advanced Heart Failure Medication Review by a Pharmacist  Does the patient  feel that his/her medications are working for him/her?  yes  Has the patient been experiencing any side effects to the medications prescribed?  no  Does the patient measure his/her own blood pressure or blood glucose at home?  no   Does the patient have any problems obtaining medications due to transportation or finances?   no  Understanding of regimen: good Understanding of indications: good Potential of compliance: good    Pharmacist comments:  Brian Martin is a 59 yo M presenting without a medication list but with a good understanding of his regimen and the importance of consistent use. He did not have any specific medication-related questions or concerns for me today.  Ruta Hinds. Velva Harman, PharmD, BCPS, CPP Clinical Pharmacist Pager: [phone] Phone: [phone] 05/05/2015 12:07 PM

## 2015-05-07 ENCOUNTER — Other Ambulatory Visit (HOSPITAL_COMMUNITY): Payer: Self-pay | Admitting: Internal Medicine

## 2015-05-21 ENCOUNTER — Ambulatory Visit (INDEPENDENT_AMBULATORY_CARE_PROVIDER_SITE_OTHER): Payer: Medicaid Other | Admitting: Pharmacist

## 2015-05-21 DIAGNOSIS — I4891 Unspecified atrial fibrillation: Secondary | ICD-10-CM | POA: Diagnosis not present

## 2015-05-21 DIAGNOSIS — Z5181 Encounter for therapeutic drug level monitoring: Secondary | ICD-10-CM

## 2015-05-21 DIAGNOSIS — Z7901 Long term (current) use of anticoagulants: Secondary | ICD-10-CM | POA: Diagnosis not present

## 2015-05-21 DIAGNOSIS — I480 Paroxysmal atrial fibrillation: Secondary | ICD-10-CM | POA: Diagnosis not present

## 2015-05-21 LAB — POCT INR: INR: 2.8

## 2015-05-22 ENCOUNTER — Other Ambulatory Visit: Payer: Self-pay | Admitting: Internal Medicine

## 2015-06-10 ENCOUNTER — Other Ambulatory Visit (HOSPITAL_COMMUNITY): Payer: Self-pay | Admitting: Internal Medicine

## 2015-07-02 ENCOUNTER — Ambulatory Visit (INDEPENDENT_AMBULATORY_CARE_PROVIDER_SITE_OTHER): Payer: Medicaid Other | Admitting: *Deleted

## 2015-07-02 ENCOUNTER — Other Ambulatory Visit: Payer: Self-pay | Admitting: *Deleted

## 2015-07-02 DIAGNOSIS — I4891 Unspecified atrial fibrillation: Secondary | ICD-10-CM | POA: Diagnosis not present

## 2015-07-02 DIAGNOSIS — Z7901 Long term (current) use of anticoagulants: Secondary | ICD-10-CM

## 2015-07-02 DIAGNOSIS — I480 Paroxysmal atrial fibrillation: Secondary | ICD-10-CM | POA: Diagnosis not present

## 2015-07-02 DIAGNOSIS — Z5181 Encounter for therapeutic drug level monitoring: Secondary | ICD-10-CM | POA: Diagnosis not present

## 2015-07-02 LAB — POCT INR: INR: 3.2

## 2015-07-06 ENCOUNTER — Other Ambulatory Visit (HOSPITAL_COMMUNITY): Payer: Self-pay | Admitting: Internal Medicine

## 2015-07-30 ENCOUNTER — Ambulatory Visit (INDEPENDENT_AMBULATORY_CARE_PROVIDER_SITE_OTHER): Payer: Medicaid Other | Admitting: *Deleted

## 2015-07-30 DIAGNOSIS — Z5181 Encounter for therapeutic drug level monitoring: Secondary | ICD-10-CM | POA: Diagnosis not present

## 2015-07-30 DIAGNOSIS — I480 Paroxysmal atrial fibrillation: Secondary | ICD-10-CM | POA: Diagnosis not present

## 2015-07-30 DIAGNOSIS — Z7901 Long term (current) use of anticoagulants: Secondary | ICD-10-CM | POA: Diagnosis not present

## 2015-07-30 DIAGNOSIS — I4891 Unspecified atrial fibrillation: Secondary | ICD-10-CM

## 2015-07-30 LAB — POCT INR: INR: 2.3

## 2015-09-21 ENCOUNTER — Ambulatory Visit (INDEPENDENT_AMBULATORY_CARE_PROVIDER_SITE_OTHER): Payer: Medicaid Other | Admitting: *Deleted

## 2015-09-21 DIAGNOSIS — Z7901 Long term (current) use of anticoagulants: Secondary | ICD-10-CM | POA: Diagnosis not present

## 2015-09-21 DIAGNOSIS — Z5181 Encounter for therapeutic drug level monitoring: Secondary | ICD-10-CM | POA: Diagnosis not present

## 2015-09-21 DIAGNOSIS — I480 Paroxysmal atrial fibrillation: Secondary | ICD-10-CM | POA: Diagnosis not present

## 2015-09-21 DIAGNOSIS — I4891 Unspecified atrial fibrillation: Secondary | ICD-10-CM

## 2015-09-21 LAB — POCT INR: INR: 3.1

## 2015-10-08 ENCOUNTER — Other Ambulatory Visit: Payer: Self-pay | Admitting: Internal Medicine

## 2015-11-05 ENCOUNTER — Ambulatory Visit (INDEPENDENT_AMBULATORY_CARE_PROVIDER_SITE_OTHER): Payer: Medicaid Other | Admitting: *Deleted

## 2015-11-05 DIAGNOSIS — I480 Paroxysmal atrial fibrillation: Secondary | ICD-10-CM | POA: Diagnosis not present

## 2015-11-05 DIAGNOSIS — I4891 Unspecified atrial fibrillation: Secondary | ICD-10-CM | POA: Diagnosis not present

## 2015-11-05 DIAGNOSIS — Z7901 Long term (current) use of anticoagulants: Secondary | ICD-10-CM | POA: Diagnosis not present

## 2015-11-05 DIAGNOSIS — Z5181 Encounter for therapeutic drug level monitoring: Secondary | ICD-10-CM

## 2015-11-05 LAB — POCT INR: INR: 3.5

## 2015-12-03 ENCOUNTER — Ambulatory Visit (INDEPENDENT_AMBULATORY_CARE_PROVIDER_SITE_OTHER): Payer: Medicaid Other | Admitting: Pharmacist

## 2015-12-03 DIAGNOSIS — Z7901 Long term (current) use of anticoagulants: Secondary | ICD-10-CM | POA: Diagnosis not present

## 2015-12-03 DIAGNOSIS — Z5181 Encounter for therapeutic drug level monitoring: Secondary | ICD-10-CM | POA: Diagnosis not present

## 2015-12-03 DIAGNOSIS — I480 Paroxysmal atrial fibrillation: Secondary | ICD-10-CM | POA: Diagnosis not present

## 2015-12-03 DIAGNOSIS — I4891 Unspecified atrial fibrillation: Secondary | ICD-10-CM

## 2015-12-03 LAB — POCT INR: INR: 2.9

## 2015-12-31 ENCOUNTER — Ambulatory Visit (INDEPENDENT_AMBULATORY_CARE_PROVIDER_SITE_OTHER): Payer: Medicaid Other | Admitting: *Deleted

## 2015-12-31 DIAGNOSIS — I480 Paroxysmal atrial fibrillation: Secondary | ICD-10-CM

## 2015-12-31 DIAGNOSIS — I4891 Unspecified atrial fibrillation: Secondary | ICD-10-CM

## 2015-12-31 DIAGNOSIS — Z7901 Long term (current) use of anticoagulants: Secondary | ICD-10-CM

## 2015-12-31 DIAGNOSIS — Z5181 Encounter for therapeutic drug level monitoring: Secondary | ICD-10-CM

## 2015-12-31 LAB — POCT INR: INR: 2.3

## 2016-01-05 ENCOUNTER — Other Ambulatory Visit (HOSPITAL_COMMUNITY): Payer: Self-pay | Admitting: Internal Medicine

## 2016-01-28 ENCOUNTER — Ambulatory Visit (INDEPENDENT_AMBULATORY_CARE_PROVIDER_SITE_OTHER): Payer: Medicaid Other | Admitting: *Deleted

## 2016-01-28 DIAGNOSIS — Z7901 Long term (current) use of anticoagulants: Secondary | ICD-10-CM

## 2016-01-28 DIAGNOSIS — Z5181 Encounter for therapeutic drug level monitoring: Secondary | ICD-10-CM

## 2016-01-28 DIAGNOSIS — I480 Paroxysmal atrial fibrillation: Secondary | ICD-10-CM | POA: Diagnosis not present

## 2016-01-28 DIAGNOSIS — I4891 Unspecified atrial fibrillation: Secondary | ICD-10-CM | POA: Diagnosis not present

## 2016-01-28 LAB — POCT INR: INR: 2.5

## 2016-02-18 ENCOUNTER — Emergency Department (HOSPITAL_COMMUNITY): Payer: Medicaid Other

## 2016-02-18 ENCOUNTER — Encounter (HOSPITAL_COMMUNITY): Payer: Self-pay | Admitting: *Deleted

## 2016-02-18 ENCOUNTER — Inpatient Hospital Stay (HOSPITAL_COMMUNITY)
Admission: EM | Admit: 2016-02-18 | Discharge: 2016-02-21 | DRG: 300 | Disposition: A | Discharge status: Home / Self Care | Payer: Medicaid Other | Attending: Internal Medicine | Admitting: Internal Medicine

## 2016-02-18 DIAGNOSIS — Z89612 Acquired absence of left leg above knee: Secondary | ICD-10-CM

## 2016-02-18 DIAGNOSIS — L03031 Cellulitis of right toe: Secondary | ICD-10-CM | POA: Diagnosis present

## 2016-02-18 DIAGNOSIS — E1122 Type 2 diabetes mellitus with diabetic chronic kidney disease: Secondary | ICD-10-CM | POA: Diagnosis present

## 2016-02-18 DIAGNOSIS — L089 Local infection of the skin and subcutaneous tissue, unspecified: Secondary | ICD-10-CM

## 2016-02-18 DIAGNOSIS — Z9111 Patient's noncompliance with dietary regimen: Secondary | ICD-10-CM

## 2016-02-18 DIAGNOSIS — L03818 Cellulitis of other sites: Secondary | ICD-10-CM | POA: Diagnosis not present

## 2016-02-18 DIAGNOSIS — I10 Essential (primary) hypertension: Secondary | ICD-10-CM | POA: Diagnosis present

## 2016-02-18 DIAGNOSIS — I482 Chronic atrial fibrillation: Secondary | ICD-10-CM | POA: Diagnosis not present

## 2016-02-18 DIAGNOSIS — I5032 Chronic diastolic (congestive) heart failure: Secondary | ICD-10-CM

## 2016-02-18 DIAGNOSIS — I4891 Unspecified atrial fibrillation: Secondary | ICD-10-CM

## 2016-02-18 DIAGNOSIS — Z794 Long term (current) use of insulin: Secondary | ICD-10-CM | POA: Diagnosis not present

## 2016-02-18 DIAGNOSIS — I130 Hypertensive heart and chronic kidney disease with heart failure and stage 1 through stage 4 chronic kidney disease, or unspecified chronic kidney disease: Secondary | ICD-10-CM | POA: Diagnosis present

## 2016-02-18 DIAGNOSIS — I5042 Chronic combined systolic (congestive) and diastolic (congestive) heart failure: Secondary | ICD-10-CM | POA: Diagnosis present

## 2016-02-18 DIAGNOSIS — Z7901 Long term (current) use of anticoagulants: Secondary | ICD-10-CM | POA: Diagnosis not present

## 2016-02-18 DIAGNOSIS — E11621 Type 2 diabetes mellitus with foot ulcer: Secondary | ICD-10-CM | POA: Diagnosis present

## 2016-02-18 DIAGNOSIS — N183 Chronic kidney disease, stage 3 (moderate): Secondary | ICD-10-CM

## 2016-02-18 DIAGNOSIS — Z87891 Personal history of nicotine dependence: Secondary | ICD-10-CM | POA: Diagnosis not present

## 2016-02-18 DIAGNOSIS — Z89512 Acquired absence of left leg below knee: Secondary | ICD-10-CM | POA: Diagnosis not present

## 2016-02-18 DIAGNOSIS — E1152 Type 2 diabetes mellitus with diabetic peripheral angiopathy with gangrene: Secondary | ICD-10-CM | POA: Diagnosis present

## 2016-02-18 DIAGNOSIS — E1165 Type 2 diabetes mellitus with hyperglycemia: Secondary | ICD-10-CM | POA: Diagnosis present

## 2016-02-18 DIAGNOSIS — N1831 Chronic kidney disease, stage 3a: Secondary | ICD-10-CM | POA: Diagnosis present

## 2016-02-18 DIAGNOSIS — L97509 Non-pressure chronic ulcer of other part of unspecified foot with unspecified severity: Secondary | ICD-10-CM | POA: Diagnosis not present

## 2016-02-18 DIAGNOSIS — I480 Paroxysmal atrial fibrillation: Secondary | ICD-10-CM | POA: Diagnosis present

## 2016-02-18 DIAGNOSIS — M79671 Pain in right foot: Secondary | ICD-10-CM | POA: Diagnosis not present

## 2016-02-18 DIAGNOSIS — L0390 Cellulitis, unspecified: Secondary | ICD-10-CM | POA: Diagnosis present

## 2016-02-18 DIAGNOSIS — T148XXA Other injury of unspecified body region, initial encounter: Secondary | ICD-10-CM

## 2016-02-18 LAB — CBC
HEMATOCRIT: 42.4 % (ref 39.0–52.0)
HEMOGLOBIN: 13.7 g/dL (ref 13.0–17.0)
MCH: 29.6 pg (ref 26.0–34.0)
MCHC: 32.3 g/dL (ref 30.0–36.0)
MCV: 91.6 fL (ref 78.0–100.0)
PLATELETS: 283 10*3/uL (ref 150–400)
RBC: 4.63 MIL/uL (ref 4.22–5.81)
RDW: 14 % (ref 11.5–15.5)
WBC: 11.5 10*3/uL — ABNORMAL HIGH (ref 4.0–10.5)

## 2016-02-18 LAB — C-REACTIVE PROTEIN: CRP: 5.3 mg/dL — ABNORMAL HIGH (ref <1.0)

## 2016-02-18 LAB — MRSA PCR SCREENING: MRSA by PCR: NEGATIVE

## 2016-02-18 LAB — BASIC METABOLIC PANEL
Anion gap: 8 (ref 5–15)
BUN: 15 mg/dL (ref 6–20)
CALCIUM: 8.9 mg/dL (ref 8.9–10.3)
CHLORIDE: 102 mmol/L (ref 101–111)
CO2: 24 mmol/L (ref 22–32)
Creatinine, Ser: 1.48 mg/dL — ABNORMAL HIGH (ref 0.61–1.24)
GFR calc Af Amer: 58 mL/min — ABNORMAL LOW (ref >60)
GFR, EST NON AFRICAN AMERICAN: 50 mL/min — AB (ref >60)
GLUCOSE: 261 mg/dL — AB (ref 65–99)
POTASSIUM: 4.3 mmol/L (ref 3.5–5.1)
Sodium: 134 mmol/L — ABNORMAL LOW (ref 135–145)

## 2016-02-18 LAB — GLUCOSE, CAPILLARY: GLUCOSE-CAPILLARY: 196 mg/dL — AB (ref 65–99)

## 2016-02-18 LAB — PROTIME-INR
INR: 3.77 — ABNORMAL HIGH (ref 0.00–1.49)
Prothrombin Time: 36.4 seconds — ABNORMAL HIGH (ref 11.6–15.2)

## 2016-02-18 LAB — I-STAT CG4 LACTIC ACID, ED
LACTIC ACID, VENOUS: 1.02 mmol/L (ref 0.5–2.0)
Lactic Acid, Venous: 1.61 mmol/L (ref 0.5–2.0)

## 2016-02-18 LAB — SEDIMENTATION RATE: Sed Rate: 71 mm/hr — ABNORMAL HIGH (ref 0–16)

## 2016-02-18 MED ORDER — CARVEDILOL 25 MG PO TABS: 25.0000 mg | ORAL_TABLET | Freq: Two times a day (BID) | ORAL | Status: DC

## 2016-02-18 MED ORDER — TETANUS-DIPHTH-ACELL PERTUSSIS 5-2.5-18.5 LF-MCG/0.5 IM SUSP
0.5000 mL | Freq: Once | INTRAMUSCULAR | Status: AC
  Administered 2016-02-18: 0.5 mL via INTRAMUSCULAR
  Filled 2016-02-18: qty 0.5

## 2016-02-18 MED ORDER — CARVEDILOL 25 MG PO TABS
25.0000 mg | ORAL_TABLET | Freq: Two times a day (BID) | ORAL | Status: DC
  Administered 2016-02-18 – 2016-02-21 (×6): 25 mg via ORAL
  Filled 2016-02-18 (×6): qty 1

## 2016-02-18 MED ORDER — SODIUM CHLORIDE 0.9% FLUSH: 3.0000 mL | INTRAVENOUS | Status: DC | PRN

## 2016-02-18 MED ORDER — SODIUM CHLORIDE 0.9 % IV SOLN: 250.0000 mL | INTRAVENOUS | Status: DC | PRN

## 2016-02-18 MED ORDER — SODIUM CHLORIDE 0.9% FLUSH
3.0000 mL | Freq: Two times a day (BID) | INTRAVENOUS | Status: DC
  Administered 2016-02-18 – 2016-02-20 (×5): 3 mL via INTRAVENOUS

## 2016-02-18 MED ORDER — INSULIN ASPART 100 UNIT/ML ~~LOC~~ SOLN
0.0000 [IU] | Freq: Three times a day (TID) | SUBCUTANEOUS | Status: DC
  Administered 2016-02-19 (×2): 5 [IU] via SUBCUTANEOUS
  Administered 2016-02-19: 3 [IU] via SUBCUTANEOUS
  Administered 2016-02-20: 5 [IU] via SUBCUTANEOUS
  Administered 2016-02-20: 3 [IU] via SUBCUTANEOUS
  Administered 2016-02-20: 5 [IU] via SUBCUTANEOUS
  Administered 2016-02-21: 3 [IU] via SUBCUTANEOUS
  Administered 2016-02-21: 8 [IU] via SUBCUTANEOUS

## 2016-02-18 MED ORDER — ENOXAPARIN SODIUM 40 MG/0.4ML ~~LOC~~ SOLN: 40.0000 mg | SUBCUTANEOUS | Status: DC

## 2016-02-18 MED ORDER — DEXTROSE 5 % IV SOLN
2.0000 g | INTRAVENOUS | Status: DC
  Administered 2016-02-18 – 2016-02-20 (×3): 2 g via INTRAVENOUS
  Filled 2016-02-18 (×4): qty 2

## 2016-02-18 MED ORDER — DOCUSATE SODIUM 100 MG PO CAPS
100.0000 mg | ORAL_CAPSULE | Freq: Two times a day (BID) | ORAL | Status: DC
  Filled 2016-02-18 (×2): qty 1

## 2016-02-18 MED ORDER — ONDANSETRON HCL 4 MG PO TABS: 4.0000 mg | ORAL_TABLET | Freq: Four times a day (QID) | ORAL | Status: DC | PRN

## 2016-02-18 MED ORDER — HYDRALAZINE HCL 25 MG PO TABS
25.0000 mg | ORAL_TABLET | Freq: Three times a day (TID) | ORAL | Status: DC
  Administered 2016-02-18 – 2016-02-21 (×8): 25 mg via ORAL
  Filled 2016-02-18 (×8): qty 1

## 2016-02-18 MED ORDER — LIDOCAINE-EPINEPHRINE (PF) 2 %-1:200000 IJ SOLN
10.0000 mL | Freq: Once | INTRAMUSCULAR | Status: AC
  Administered 2016-02-18: 10 mL via INTRADERMAL
  Filled 2016-02-18: qty 20

## 2016-02-18 MED ORDER — ONDANSETRON HCL 4 MG/2ML IJ SOLN: 4.0000 mg | Freq: Four times a day (QID) | INTRAMUSCULAR | Status: DC | PRN

## 2016-02-18 MED ORDER — ACETAMINOPHEN 325 MG PO TABS: 650.0000 mg | ORAL_TABLET | Freq: Four times a day (QID) | ORAL | Status: DC | PRN

## 2016-02-18 MED ORDER — INSULIN ASPART 100 UNIT/ML ~~LOC~~ SOLN
0.0000 [IU] | Freq: Every day | SUBCUTANEOUS | Status: DC
  Administered 2016-02-20: 2 [IU] via SUBCUTANEOUS

## 2016-02-18 MED ORDER — PIPERACILLIN-TAZOBACTAM 3.375 G IVPB 30 MIN: 3.3750 g | Freq: Once | INTRAVENOUS | Status: DC

## 2016-02-18 MED ORDER — METRONIDAZOLE IN NACL 5-0.79 MG/ML-% IV SOLN
500.0000 mg | Freq: Three times a day (TID) | INTRAVENOUS | Status: DC
  Administered 2016-02-18 – 2016-02-21 (×9): 500 mg via INTRAVENOUS
  Filled 2016-02-18 (×9): qty 100

## 2016-02-18 MED ORDER — ACETAMINOPHEN 650 MG RE SUPP: 650.0000 mg | Freq: Four times a day (QID) | RECTAL | Status: DC | PRN

## 2016-02-18 NOTE — Progress Notes (Signed)
ANTICOAGULATION CONSULT NOTE - Initial Consult  Pharmacy Consult for Coumadin Indication: atrial fibrillation  No Known Allergies  Patient Measurements: Height: 6\' 1"  (185.4 cm) Weight: 271 lb 4 oz (123.038 kg) IBW/kg (Calculated) : 79.9   Vital Signs: Temp: 98.1 F (36.7 C) (06/23 1920) Temp Source: Oral (06/23 1334) BP: 162/82 mmHg (06/23 1920) Pulse Rate: 78 (06/23 1920)  Labs:  Recent Labs  02/18/16 1349 02/18/16 1909  HGB 13.7  --   HCT 42.4  --   PLT 283  --   LABPROT  --  36.4*  INR  --  3.77*  CREATININE 1.48*  --     Estimated Creatinine Clearance: 72.9 mL/min (by C-G formula based on Cr of 1.48).   Medical History: Past Medical History  Diagnosis Date  . Atrial fibrillation ([place])   . CHF (congestive heart failure) ([facility])     chronic systolic CHF  . Diabetes mellitus     type 2  . Hypertension   . Chronic renal failure   . Amputation of leg     Assessment: 60 year old male admitted with diabetic foot ulcer on Coumadin PTA for Afib.  Admit INR = 3.77 with PTA dose of 4.5 mg po daily. Has already had dose today  Goal of Therapy:  INR 2-3 Monitor platelets by anticoagulation protocol: Yes   Plan:  No Coumadin tonight Daily INR  Thank you Anette Guarneri, PharmD   Tad Moore 02/18/2016,7:58 PM

## 2016-02-18 NOTE — ED Notes (Signed)
Pt in from PCP office d/t discoloration, redness & blackened area to  R inner lateral foot 4 cm x 3 cm area that goes between R big toe & R 2nd toe, pt has BKA to L leg, pt has DM, pt denies drainage from the foot, pt able to wiggle toes, pt reports having Xray this week with no evidence of bone infection per pt, A&O x4

## 2016-02-18 NOTE — ED Notes (Signed)
Placed gown on patient and on the monitor

## 2016-02-18 NOTE — H&P (Addendum)
History and Physical    Brian Martin WVP:[id number] DOB: 01/03/1956 DOA: 02/18/2016    PCP: Marco Collie, MD  Patient coming from: home  Chief Complaint: blister on foot  HPI: Brian Martin is a 60 y.o. male with medical history significant of diabetes mellitus type 2 on insulin, atrial fibrillation on Coumadin, chronic systolic heart failure on Lasix, hypertension, chronic kidney disease stage III, left BKA. The patient states about a week ago he developed a blister on the right first toe which subsequently progressed to redness that went up his foot. He was seen by his PCP who prescribed clindamycin. 2 days later he went to see his PCP because an area on his toe had turned white. At that point he was put on Diflucan. He went back to see his PCP today. The white patch in his toe and foot had grown and PCP suspected that it may contain pus and therefore sent him into the ER. The area was debrided in ER and according to the ER doctor, it is difficult to tell if it was dead skin tissue versus pus however, no drainage from the foot was encountered. The patient has not had significant pain in his foot but has noted burning for the past 3-4 days. There is a small area on the top of the toe which is becoming bluish. In regards to his diabetes, he is very strict was taking his Lantus but over the past couple of months, due to a job change, he has not been as adherent with his diet as he should've been. Last hemoglobin A1c was done a month ago and was 8.6.  ED Course: X-ray of the foot performed does not show any osteomyelitis-  he is afebrile-he has mild leukocytosis with a WBC count of 11.5-ESR 71-CRP 5.3  Review of Systems:  Occasionally gets a twinging chest pain on the right side of his chest Has been a little anxious about this current infection All other systems reviewed and apart from HPI, are negative.  Past Medical History  Diagnosis Date  . Atrial fibrillation ([place])   . CHF (congestive  heart failure) (HCC)     chronic systolic CHF  . Diabetes mellitus     type 2  . Hypertension   . Chronic renal failure   . Amputation of leg     Past Surgical History  Procedure Laterality Date  . 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 Esophagogastroduodenoscopy N/A 06/14/2013    Procedure: ESOPHAGOGASTRODUODENOSCOPY (EGD);  Surgeon: Lear Ng, MD;  Location: [facility] ENDOSCOPY;  Service: Endoscopy;  Laterality: N/A;  . Colonoscopy N/A 06/15/2013    Procedure: COLONOSCOPY;  Surgeon: Lear Ng, MD;  Location: [facility] ENDOSCOPY;  Service: Endoscopy;  Laterality: N/A;    Social History:   reports that he quit smoking about 14 years ago. He does not have any smokeless tobacco history on file. He reports that he uses illicit drugs (Marijuana). He reports that he does not drink alcohol.  No Known Allergies  Family History  Problem Relation Age of Onset  . Diabetes Other      Prior to Admission medications   Medication Sig Start Date End Date Taking? Authorizing Provider  Ascorbic Acid (VITAMIN C) 100 MG tablet Take 100 mg by mouth daily.   Yes Historical Provider, MD  carvedilol (COREG) 25 MG tablet Take 25 mg by mouth 2 (two) times daily with a meal.   Yes Historical Provider, MD  clindamycin (  CLEOCIN) 300 MG capsule Take 300 mg by mouth 3 (three) times daily.   Yes Historical Provider, MD  ferrous sulfate 325 (65 FE) MG tablet Take 1 tablet (325 mg total) by mouth daily with breakfast. 06/16/13  Yes Ivor Costa, MD  FLUCONAZOLE PO Take 1 tablet by mouth daily.   Yes Historical Provider, MD  furosemide (LASIX) 20 MG tablet Take 1 tablet (20 mg total) by mouth daily. 07/03/14  Yes Rande Brunt, NP  hydrALAZINE (APRESOLINE) 25 MG tablet TAKE ONE TABLET BY MOUTH THREE TIMES DAILY 03/15/15  Yes Jolaine Artist, MD  insulin glargine (LANTUS) 100 UNIT/ML injection Inject 25 Units into the skin at bedtime.  03/03/11  Yes Jolaine Artist, MD  isosorbide mononitrate  (IMDUR) 60 MG 24 hr tablet Take 60 mg by mouth daily.   Yes Historical Provider, MD  pantoprazole (PROTONIX) 40 MG tablet Take 1 tablet (40 mg total) by mouth daily. 06/16/13  Yes Ivor Costa, MD  simvastatin (ZOCOR) 10 MG tablet Take 10 mg by mouth daily.   Yes Historical Provider, MD  warfarin (COUMADIN) 3 MG tablet Take 1 tablet (3 mg total) by mouth as directed. Take up to 1.5 tablets daily or As Directed by [facility]madin Clinic. Patient taking differently: Take 4.5 mg by mouth as directed. Take up to 1.5 tablets daily or As Directed by Coumadin Clinic. 10/08/15  Yes Jolaine Artist, MD  docusate sodium (COLACE) 100 MG capsule Take 1 capsule (100 mg total) by mouth 2 (two) times daily as needed for constipation. 06/16/13   Ivor Costa, MD  hydrALAZINE (APRESOLINE) 25 MG tablet TAKE ONE TABLET BY MOUTH THREE TIMES DAILY 01/06/16   Jolaine Artist, MD    Physical Exam: Filed Vitals:   02/18/16 1615 02/18/16 1630 02/18/16 1645 02/18/16 1700  BP: 139/89 152/83 154/80 155/93  Pulse: 77 77 72 79  Temp:      TempSrc:      Resp: _0 Height:      Weight:      SpO2: 98% 98% 100% 99%      Constitutional: NAD, calm, comfortable Eyes: PERTLA, lids and conjunctivae normal ENMT: Mucous membranes are moist. Posterior pharynx clear of any exudate or lesions. Normal dentition.  Neck: normal, supple, no masses, no thyromegaly Respiratory: clear to auscultation bilaterally, no wheezing, no crackles. Normal respiratory effort. No accessory muscle use.  Cardiovascular: S1 & S2 heard, regular rate and rhythm, no murmurs / rubs / gallops. No extremity edema. 2+ pedal pulses. No carotid bruits.  Abdomen: No distension, no tenderness, no masses palpated. No hepatosplenomegaly. Bowel sounds normal.  Musculoskeletal: no clubbing / cyanosis. No joint deformity upper and lower extremities. Good ROM, no contractures. Normal muscle tone.  Skin: Erythema and swelling of right foot in the area of the first  metatarsal head and the first toe. It can be seen that day skin has been surgically removed from this area. There is no drainage noted. It is quite tender to the touch but there is no fluctuation noted. There is a small area of bluish discoloration on the dorsum of the toe. Neurologic: CN 2-12 grossly intact. Sensation intact, DTR normal. Strength 5/5 in all 4 limbs.  Psychiatric: Normal judgment and insight. Alert and oriented x 3. Normal mood.     Labs on Admission: I have personally reviewed following labs and imaging studies  CBC:  Recent Labs Lab 02/18/16 1349  WBC 11.5*  HGB 13.7  HCT 42.4  MCV 91.6  PLT 295   Basic Metabolic Panel:  Recent Labs Lab 02/18/16 1349  NA 134*  K 4.3  CL 102  CO2 24  GLUCOSE 261*  BUN 15  CREATININE 1.48*  CALCIUM 8.9   GFR: Estimated Creatinine Clearance: 72.9 mL/min (by C-G formula based on Cr of 1.48). Liver Function Tests: No results for input(s): AST, ALT, ALKPHOS, BILITOT, PROT, ALBUMIN in the last 168 hours. No results for input(s): LIPASE, AMYLASE in the last 168 hours. No results for input(s): AMMONIA in the last 168 hours. Coagulation Profile: No results for input(s): INR, PROTIME in the last 168 hours. Cardiac Enzymes: No results for input(s): CKTOTAL, CKMB, CKMBINDEX, TROPONINI in the last 168 hours. BNP (last 3 results) No results for input(s): PROBNP in the last 8760 hours. HbA1C: No results for input(s): HGBA1C in the last 72 hours. CBG: No results for input(s): GLUCAP in the last 168 hours. Lipid Profile: No results for input(s): CHOL, HDL, LDLCALC, TRIG, CHOLHDL, LDLDIRECT in the last 72 hours. Thyroid Function Tests: No results for input(s): TSH, T4TOTAL, FREET4, T3FREE, THYROIDAB in the last 72 hours. Anemia Panel: No results for input(s): VITAMINB12, FOLATE, FERRITIN, TIBC, IRON, RETICCTPCT in the last 72 hours. Urine analysis:    Component Value Date/Time   COLORURINE YELLOW 06/30/2010 1037    APPEARANCEUR CLOUDY* 06/30/2010 1037   LABSPEC 1.025 06/30/2010 1037   PHURINE 5.0 06/30/2010 1037   GLUCOSEU NEGATIVE 06/30/2010 1037   HGBUR LARGE* 06/30/2010 1037   BILIRUBINUR NEGATIVE 06/30/2010 1037   KETONESUR NEGATIVE 06/30/2010 1037   PROTEINUR NEGATIVE 06/30/2010 1037   UROBILINOGEN 0.2 06/30/2010 1037   NITRITE NEGATIVE 06/30/2010 1037   LEUKOCYTESUR SMALL* 06/30/2010 1037   Sepsis Labs: _0 (procalcitonin:4,lacticidven:4) )No results found for this or any previous visit (from the past 240 hour(s)).   Radiological Exams on Admission: Dg Foot 2 Views Right  02/18/2016  CLINICAL DATA:  Cyanosis and erythema involving distal right foot and toes. Diabetes. Evaluate for osteomyelitis. EXAM: RIGHT FOOT - 2 VIEW COMPARISON:  None. FINDINGS: There is no evidence of fracture or dislocation. No evidence of focal osteolysis or periostitis. There is no evidence of arthropathy or other focal bone abnormality. Soft tissue swelling is seen involving the great toe, without evidence of soft tissue gas or radiopaque foreign body. IMPRESSION: Soft tissue swelling of great toe. No radiographic evidence of osteomyelitis or other osseous abnormality. Electronically Signed   By: Earle Gell M.D.   On: 02/18/2016 16:59    EKG: Independently reviewed. 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 -Most likely was dead skin which has been debrided in the ER - started on antibiotic based on diabetic foot order set which are Rocephin and Flagyl -No signs of osteomyelitis as of yet-monitor for development of abscess -Ortho has been called by ER per my request  Active Problems: Diabetes mellitus type 2, uncontrolled -Continue current doses of Lantus and add sliding scale -Check hemoglobin A1c again as last one was a month ago    Essential hypertension - Hydralazine, Imdur,  carvedilol    Atrial fibrillation  -Continue carvedilol and Coumadin per pharmacy    CHRONIC KIDNEY DISEASE STAGE III  -Follow    Chronic diastolic CHF (congestive heart failure) -He has grade 1 diastolic dysfunction per echo in 9/16 -Continue low-dose Lasix      DVT prophylaxis: Enoxaparin Code Status: Full code  Family Communication:  Disposition Plan: IV antibiotics needed to prevent progression to osteomyelitis Consults called: Ortho consult  Admission status: Brian Salmon MD [facility] Pager: [email or web address] Password [email or web address] 7PM-7AM, please contact night-coverage   02/18/2016, 5:59 PM

## 2016-02-18 NOTE — Progress Notes (Signed)
ORTHO  Spoken with ED re: diabetic foot ulcer.  Patient also on coumadin.  No radiographic evidence of osteo yet, MRI pending, XR negative. The patient apparently has reported improvement in his foot cellulitis to Dr. Wynelle Cleveland.    Spoken with Dr. Sharol Given, who will plan to eval tomorrow likely pm, and surgery sun if necessary, but there are a lot of things pending right now.    I have also spoken with Dr. Wynelle Cleveland to communicate the plan.   I am available earlier if something emergent arises.    Johnny Bridge, MD

## 2016-02-18 NOTE — Progress Notes (Signed)
Inpatient Diabetes Program Recommendations  AACE/ADA: New Consensus Statement on Inpatient Glycemic Control (2015)  Target Ranges:  Prepandial:   less than 140 mg/dL      Peak postprandial:   less than 180 mg/dL (1-2 hours)      Critically ill patients:  140 - 180 mg/dL   Results for TICO, KIESLING (MRN [id number]) as of 02/18/2016 20:27  Ref. Range 02/18/2016 13:49  Glucose Latest Ref Range: 65-99 mg/dL 261 (H)    Admit with: Foot Ulcer  History: DM, CKD, CHF  Home DM Meds: Lantus 25 units QHS  Current Insulin Orders: Novolog Moderate Correction Scale/ SSI (0-15 units) TID AC + HS     -Note patient to start Novolog Moderate Correction/SSI tonight at bedtime.  -Current A1c pending.  Per MD notes, patient stated his most recent A1c was 8.6% done 1 month ago (per patient report).    MD- If patient has elevated fasting glucose tomorrow AM (06/24), please consider starting a portion of patient's home Lantus-  Lantus 20 units QHS (80% home dose to start)     --Will follow patient during hospitalization--  Wyn Quaker RN, MSN, CDE Diabetes Coordinator Inpatient Glycemic Control Team Team Pager: [phone] (8a-5p)

## 2016-02-18 NOTE — ED Provider Notes (Signed)
CSN: [id number]     Arrival date & time 02/18/16  1321 History   First MD Initiated Contact with Patient 02/18/16 1458     Chief Complaint  Patient presents with  . Foot Pain   HPI 2 month ago patient had new prosthesis and shoes and developed a small blister, last week he started developing swelling and redness around his right foot. Patient has a history of asthma mellitus AKA eyes CHF less BKA and did not become concerned about his right ulcer until last week when it became red and painful. He does have decreased sensation there. The site started having white puslike discharge around it on Wednesday and he went to his primary care provider who ordered an x-ray that did not reveal osteomyelitis. He was placed on clindamycin on Tuesday and has been taking it 3 times a day. He ignores how well his sugar has been controlled as he only takes Lantus 20 5 at night but no other when necessary. Patient stated he had some fevers on Tuesday but none since. He states that the site was demarcated and that the erythema is decreasing but the white discharge below his skin is not. Patient ate nor is how his sugars have been controlled.  Past Medical History  Diagnosis Date  . Atrial fibrillation ([place])   . CHF (congestive heart failure) (HCC)     chronic systolic CHF  . Diabetes mellitus     type 2  . Hypertension   . Chronic renal failure   . Amputation of leg    Past Surgical History  Procedure Laterality Date  . 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 Esophagogastroduodenoscopy N/A 06/14/2013    Procedure: ESOPHAGOGASTRODUODENOSCOPY (EGD);  Surgeon: Lear Ng, MD;  Location: [facility] ENDOSCOPY;  Service: Endoscopy;  Laterality: N/A;  . Colonoscopy N/A 06/15/2013    Procedure: COLONOSCOPY;  Surgeon: Lear Ng, MD;  Location: [facility] ENDOSCOPY;  Service: Endoscopy;  Laterality: N/A;   Family History  Problem Relation Age of Onset  . Diabetes Other    Social History  Substance  Use Topics  . Smoking status: Former Smoker    Quit date: 08/28/2001  . Smokeless tobacco: None  . Alcohol Use: No    Review of Systems  Constitutional: Positive for fever.  Respiratory: Negative for cough and shortness of breath.   Cardiovascular: Negative for chest pain.  Allergic/Immunologic: Positive for immunocompromised state (diabetes).  All other systems reviewed and are negative.     Allergies  Review of patient's allergies indicates no known allergies.  Home Medications   Prior to Admission medications   Medication Sig Start Date End Date Taking? Authorizing Provider  Ascorbic Acid (VITAMIN C) 100 MG tablet Take 100 mg by mouth daily.   Yes Historical Provider, MD  carvedilol (COREG) 25 MG tablet Take 25 mg by mouth 2 (two) times daily with a meal.   Yes Historical Provider, MD  clindamycin (CLEOCIN) 300 MG capsule Take 300 mg by mouth 3 (three) times daily.   Yes Historical Provider, MD  ferrous sulfate 325 (65 FE) MG tablet Take 1 tablet (325 mg total) by mouth daily with breakfast. 06/16/13  Yes Ivor Costa, MD  FLUCONAZOLE PO Take 1 tablet by mouth daily.   Yes Historical Provider, MD  furosemide (LASIX) 20 MG tablet Take 1 tablet (20 mg total) by mouth daily. 07/03/14  Yes Rande Brunt, NP  hydrALAZINE (APRESOLINE) 25 MG tablet TAKE ONE TABLET BY  MOUTH THREE TIMES DAILY 03/15/15  Yes Jolaine Artist, MD  insulin glargine (LANTUS) 100 UNIT/ML injection Inject 25 Units into the skin at bedtime.  03/03/11  Yes Jolaine Artist, MD  isosorbide mononitrate (IMDUR) 60 MG 24 hr tablet Take 60 mg by mouth daily.   Yes Historical Provider, MD  pantoprazole (PROTONIX) 40 MG tablet Take 1 tablet (40 mg total) by mouth daily. 06/16/13  Yes Ivor Costa, MD  simvastatin (ZOCOR) 10 MG tablet Take 10 mg by mouth daily.   Yes Historical Provider, MD  warfarin (COUMADIN) 3 MG tablet Take 1 tablet (3 mg total) by mouth as directed. Take up to 1.5 tablets daily or As Directed by Coumadin  Clinic. Patient taking differently: Take 4.5 mg by mouth as directed. Take up to 1.5 tablets daily or As Directed by [facility]madin Clinic. 10/08/15  Yes Jolaine Artist, MD  docusate sodium (COLACE) 100 MG capsule Take 1 capsule (100 mg total) by mouth 2 (two) times daily as needed for constipation. 06/16/13   Ivor Costa, MD  hydrALAZINE (APRESOLINE) 25 MG tablet TAKE ONE TABLET BY MOUTH THREE TIMES DAILY 01/06/16   Jolaine Artist, MD   BP 155/93 mmHg  Pulse 79  Temp(Src) 98.5 F (36.9 C) (Oral)  Resp 18  Ht '6\' 1"'  (1.854 m)  Wt 123.038 kg  BMI 35.79 kg/m2  SpO2 99% Physical Exam  Constitutional: He appears well-developed and well-nourished. No distress.  HENT:  Head: Normocephalic and atraumatic.  Left Ear: External ear normal.  Eyes: Conjunctivae are normal. Pupils are equal, round, and reactive to light. Right eye exhibits no discharge. Left eye exhibits no discharge.  Neck: Normal range of motion. Neck supple.  Cardiovascular: Normal rate and regular rhythm.   No murmur heard. Pulmonary/Chest: Effort normal and breath sounds normal. No respiratory distress.  Abdominal: Soft. Bowel sounds are normal. He exhibits no distension and no mass. There is no tenderness. There is no rebound and no guarding.  Musculoskeletal:  Right LE phalynx with 5cmx3cm medial pustular contents below the surface of the skin, no fluctuance, draining clear fluid. Tender with surrounding erythema but decreased from prior site of demarcation. Extends approx mid-way through dorsum of foot. Full ROM. 2+ DP and PT pulse, decreased sensation; gangrene black on right dorsal aspect of phalynx between first and second digit  Neurological: He is alert.  Skin: Skin is warm. He is not diaphoretic.  Psychiatric: He has a normal mood and affect.   ED Course  .Marland KitchenIncision and Drainage Date/Time: 02/18/2016 4:06 PM Performed by: Karma Greaser Authorized by: Karma Greaser Consent: Verbal consent  obtained. Risks and benefits: risks, benefits and alternatives were discussed Consent given by: patient Patient understanding: patient states understanding of the procedure being performed Patient identity confirmed: verbally with patient Type: abscess Body area: lower extremity Location details: right foot Anesthesia: local infiltration Local anesthetic: lidocaine 1% with epinephrine Anesthetic total: 3 ml Patient sedated: no Scalpel size: 11 Needle gauge: 22 Incision type: single straight Incision depth: dermal Complexity: simple Drainage: purulent (vs dead tissue) Drainage amount: scant Wound treatment: wound left open Packing material: 1/2 in iodoform gauze Patient tolerance: Patient tolerated the procedure well with no immediate complications   (including critical care time) Labs Review Labs Reviewed  CBC - Abnormal; Notable for the following:    WBC 11.5 (*)    All other components within normal limits  BASIC METABOLIC PANEL - Abnormal; Notable for the following:    Sodium 134 (*)  Glucose, Bld 261 (*)    Creatinine, Ser 1.48 (*)    GFR calc non Af Amer 50 (*)    GFR calc Af Amer 58 (*)    All other components within normal limits  SEDIMENTATION RATE - Abnormal; Notable for the following:    Sed Rate 71 (*)    All other components within normal limits  C-REACTIVE PROTEIN - Abnormal; Notable for the following:    CRP 5.3 (*)    All other components within normal limits  I-STAT CG4 LACTIC ACID, ED  I-STAT CG4 LACTIC ACID, ED    Imaging Review Dg Foot 2 Views Right  02/18/2016  CLINICAL DATA:  Cyanosis and erythema involving distal right foot and toes. Diabetes. Evaluate for osteomyelitis. EXAM: RIGHT FOOT - 2 VIEW COMPARISON:  None. FINDINGS: There is no evidence of fracture or dislocation. No evidence of focal osteolysis or periostitis. There is no evidence of arthropathy or other focal bone abnormality. Soft tissue swelling is seen involving the great toe,  without evidence of soft tissue gas or radiopaque foreign body. IMPRESSION: Soft tissue swelling of great toe. No radiographic evidence of osteomyelitis or other osseous abnormality. Electronically Signed   By: Earle Gell M.D.   On: 02/18/2016 16:59   I have personally reviewed and evaluated these images and lab results as part of my medical decision-making.   EKG Interpretation None      MDM   Final diagnoses:  Wound infection ([place])    Patient with elevated leukocytosis, ESR and CRP concerning for osteomyelitis. Wound with gangrene, questionable pus on I&D. Patient also has poor control of his sugars which is likely not improving his symptoms. He is on clindamycin but no Pseudomonas coverage. Likely requires admission for MRI and possible biopsy. Spoke with Dr. Mardelle Matte from Orthopedics who will see the patient as an inpatient. He is okay with starting antibiotics. Will admit to medicine.    Karma Greaser, MD 02/18/16 1738  Elnora Morrison, MD 02/19/16 0001

## 2016-02-19 ENCOUNTER — Encounter (HOSPITAL_COMMUNITY): Payer: Medicaid Other

## 2016-02-19 LAB — GLUCOSE, CAPILLARY
GLUCOSE-CAPILLARY: 226 mg/dL — AB (ref 65–99)
GLUCOSE-CAPILLARY: 233 mg/dL — AB (ref 65–99)
GLUCOSE-CAPILLARY: 183 mg/dL — AB (ref 65–99)
Glucose-Capillary: 181 mg/dL — ABNORMAL HIGH (ref 65–99)

## 2016-02-19 LAB — CBC
HCT: 41.5 % (ref 39.0–52.0)
Hemoglobin: 13.6 g/dL (ref 13.0–17.0)
MCH: 29.8 pg (ref 26.0–34.0)
MCHC: 32.8 g/dL (ref 30.0–36.0)
MCV: 91 fL (ref 78.0–100.0)
PLATELETS: 252 10*3/uL (ref 150–400)
RBC: 4.56 MIL/uL (ref 4.22–5.81)
RDW: 13.8 % (ref 11.5–15.5)
WBC: 10 10*3/uL (ref 4.0–10.5)

## 2016-02-19 LAB — BASIC METABOLIC PANEL
Anion gap: 10 (ref 5–15)
BUN: 18 mg/dL (ref 6–20)
CALCIUM: 9.2 mg/dL (ref 8.9–10.3)
CHLORIDE: 101 mmol/L (ref 101–111)
CO2: 24 mmol/L (ref 22–32)
CREATININE: 1.35 mg/dL — AB (ref 0.61–1.24)
GFR calc non Af Amer: 56 mL/min — ABNORMAL LOW (ref >60)
GLUCOSE: 199 mg/dL — AB (ref 65–99)
Potassium: 4.6 mmol/L (ref 3.5–5.1)
Sodium: 135 mmol/L (ref 135–145)

## 2016-02-19 LAB — PROTIME-INR
INR: 4.4 — ABNORMAL HIGH (ref 0.00–1.49)
PROTHROMBIN TIME: 40.8 s — AB (ref 11.6–15.2)

## 2016-02-19 LAB — HEMOGLOBIN A1C
Hgb A1c MFr Bld: 8.7 % — ABNORMAL HIGH (ref 4.8–5.6)
MEAN PLASMA GLUCOSE: 203 mg/dL

## 2016-02-19 LAB — HIV ANTIBODY (ROUTINE TESTING W REFLEX): HIV SCREEN 4TH GENERATION: NONREACTIVE

## 2016-02-19 LAB — URIC ACID: Uric Acid, Serum: 6.8 mg/dL (ref 4.4–7.6)

## 2016-02-19 MED ORDER — PRO-STAT SUGAR FREE PO LIQD
30.0000 mL | Freq: Two times a day (BID) | ORAL | Status: DC
  Administered 2016-02-19 – 2016-02-21 (×5): 30 mL via ORAL
  Filled 2016-02-19 (×5): qty 30

## 2016-02-19 NOTE — Progress Notes (Signed)
Inpatient Diabetes Program Recommendations  AACE/ADA: New Consensus Statement on Inpatient Glycemic Control (2015)  Target Ranges:  Prepandial:   less than 140 mg/dL      Peak postprandial:   less than 180 mg/dL (1-2 hours)      Critically ill patients:  140 - 180 mg/dL    Results for Brian Martin, AMENTA (MRN [id number]) as of 02/19/2016 12:38  Ref. Range 02/19/2016 07:43 02/19/2016 11:41  Glucose-Capillary Latest Ref Range: 65-99 mg/dL 226 (H) 181 (H)    Admit with: Foot Ulcer  History: DM, CKD, CHF  Home DM Meds: Lantus 25 units QHS  Current Insulin Orders: Novolog Moderate Correction Scale/ SSI (0-15 units) TID AC + HS      MD- Please consider the following in-hospital insulin adjustments:  Start Lantus 20 units QHS (80% home dose to start)     --Will follow patient during hospitalization--  Wyn Quaker RN, MSN, CDE Diabetes Coordinator Inpatient Glycemic Control Team Team Pager: [phone] (8a-5p)'

## 2016-02-19 NOTE — Progress Notes (Signed)
Initial Nutrition Assessment  DOCUMENTATION CODES:   Not applicable  INTERVENTION:   Prostat liquid protein po 30 ml BID with meals, each supplement provides 100 kcal, 15 grams protein  NUTRITION DIAGNOSIS:   Increased nutrient needs related to wound healing as evidenced by estimated needs   GOAL:   Patient will meet greater than or equal to 90% of their needs  MONITOR:   PO intake, Supplement acceptance, Labs, Weight trends, Skin, I & O's  REASON FOR ASSESSMENT:   Consult Wound healing  ASSESSMENT:   60 y.o. Male with PMH of diabetes mellitus type 2 on insulin, atrial fibrillation on Coumadin, chronic systolic heart failure on Lasix, hypertension, chronic kidney disease stage III, left BKA. The patient states about a week ago he developed a blister on the right first toe which subsequently progressed to redness that went up his foot. He was seen by his PCP who prescribed clindamycin. 2 days later he went to see his PCP because an area on his toe had turned white. At that point he was put on Diflucan. He went back to see his PCP today. The white patch in his toe and foot had grown and PCP suspected that it may contain pus and therefore sent him into the ER. The area was debrided in ER and according to the ER doctor, it is difficult to tell if it was dead skin tissue versus pus however, no drainage from the foot was encountered. The patient has not had significant pain in his foot but has noted burning for the past 3-4 days. There is a small area on the top of the toe which is becoming bluish.  Patient states his appetite is good >> eating breakfast upon visit. PO intake 100% per flowsheet records. Would benefit from addition protein supplement >> pt amenable >> will order. Nutrition focused physical exam completed.  No muscle or subcutaneous fat depletion noticed.  Diet Order:  Diet Carb Modified Fluid consistency:: Thin; Room service appropriate?: Yes  Skin:  Wound (see  comment) (diabetic toe ulcer)  Last BM:  6/23  Height:   Ht Readings from Last 1 Encounters:  02/18/16 6\' 1"  (1.854 m)    Weight:   Wt Readings from Last 1 Encounters:  02/18/16 271 lb 4 oz (123.038 kg)    Ideal Body Weight:  79 kg  BMI:  38.2 kg/m2 >> adjusted for BKA  Estimated Nutritional Needs:   Kcal:  2100-2300  Protein:  110-120 gm  Fluid:  2.1-2.3 L  EDUCATION NEEDS:   No education needs identified at this time  Arthur Holms, RD, LDN Pager #: [phone] After-Hours Pager #: [phone]

## 2016-02-19 NOTE — Progress Notes (Signed)
PROGRESS NOTE    Brian Martin  [id number] DOB: [birth year] DOA: 02/18/2016  PCP: Marco Collie, MD   Brief Narrative:  Brian Martin is a 60 y.o. male with medical history significant of diabetes mellitus type 2 on insulin, atrial fibrillation on Coumadin, chronic systolic heart failure on Lasix, hypertension, chronic kidney disease stage III, left BKA. The patient states about a week ago he developed a blister on the right first toe which subsequently progressed to redness that went up his foot. He was seen by his PCP who prescribed clindamycin. 2 days later he went to see his PCP because an area on his toe had turned white. At that point he was put on Diflucan. He went back to see his PCP today. The white patch in his toe and foot had grown and PCP suspected that it may contain pus and therefore sent him into the ER. The area was debrided in ER and according to the ER doctor, it is difficult to tell if it was dead skin tissue versus pus however, no drainage from the foot was encountered. The patient has not had significant pain in his foot but has noted burning for the past 3-4 days. There is a small area on the top of the toe which is becoming bluish.  Subjective: No change in symptoms. Has not looked at toe this morning. No new symptoms.   Assessment & Plan:   Principal Problem:   Cellulitis - right first toe- no osteomyelitis on xray - await MRI and eval byortho -erythema  improving on current antibiotics- Rocephin and Flagyl  Active Problems: Active Problems: Diabetes mellitus type 2, uncontrolled -Continue current doses of Lantus and add sliding scale - hemoglobin A1c 8.7- was 8.6 1 mo ago- has had dietary non-compliance lately   Essential hypertension - Hydralazine, Imdur, carvedilol   Atrial fibrillation  -Continue carvedilol  - INR elevated - hold coumadin   CHRONIC KIDNEY DISEASE STAGE III  -Follow   Chronic diastolic CHF (congestive heart failure) -He has  grade 1 diastolic dysfunction per echo in 9/16 -Continue low-dose Lasix    DVT prophylaxis: coumadin Code Status: full code Family Communication:  Disposition Plan: depending on MRI report Consultants:   ortho Procedures:   none Antimicrobials:  Anti-infectives    Start     Dose/Rate Route Frequency Ordered Stop   02/18/16 1800  cefTRIAXone (ROCEPHIN) 2 g in dextrose 5 % 50 mL IVPB     2 g 100 mL/hr over 30 Minutes Intravenous Every 24 hours 02/18/16 1742     02/18/16 1800  metroNIDAZOLE (FLAGYL) IVPB 500 mg     500 mg 100 mL/hr over 60 Minutes Intravenous Every 8 hours 02/18/16 1742     02/18/16 1745  piperacillin-tazobactam (ZOSYN) IVPB 3.375 g  Status:  Discontinued     3.375 g 100 mL/hr over 30 Minutes Intravenous  Once 02/18/16 1734 02/18/16 1742       Objective: Filed Vitals:   02/18/16 1920 02/18/16 2230 02/19/16 0517 02/19/16 0851  BP: 162/82 127/68 144/74 160/80  Pulse: 78 79 79 73  Temp: 98.1 F (36.7 C) 98.2 F (36.8 C) 98.6 F (37 C)   TempSrc:      Resp: 17 18 20    Height:      Weight:      SpO2: 99% 97% 95%     Intake/Output Summary (Last 24 hours) at 02/19/16 1206 Last data filed at 02/19/16 1013  Gross per 24 hour  Intake  200 ml  Output    850 ml  Net   -650 ml   Filed Weights   02/18/16 1334  Weight: 123.038 kg (271 lb 4 oz)    Examination: General exam: Appears comfortable  HEENT: PERRLA, oral mucosa moist, no sclera icterus or thrush Respiratory system: Clear to auscultation. Respiratory effort normal. Cardiovascular system: S1 & S2 heard, RRR.  No murmurs  Gastrointestinal system: Abdomen soft, non-tender, nondistended. Normal bowel sound. No organomegaly Central nervous system: Alert and oriented. No focal neurological deficits. Extremities: No cyanosis, clubbing or edema Skin: Erythema and swelling of right foot in the area of the first metatarsal head and the first toe. It can be seen that day skin has been surgically  removed from this area. There is no drainage noted. It is quite tender to the touch but there is no fluctuation noted. There is a small area of bluish discoloration on the dorsum of the toe. Psychiatry:  Mood & affect appropriate.      Data Reviewed: I have personally reviewed following labs and imaging studies  CBC:  Recent Labs Lab 02/18/16 1349 02/19/16 0756  WBC 11.5* 10.0  HGB 13.7 13.6  HCT 42.4 41.5  MCV 91.6 91.0  PLT 283 [id number]   Basic Metabolic Panel:  Recent Labs Lab 02/18/16 1349 02/19/16 0756  NA 134* 135  K 4.3 4.6  CL 102 101  CO2 24 24  GLUCOSE 261* 199*  BUN 15 18  CREATININE 1.48* 1.35*  CALCIUM 8.9 9.2   GFR: Estimated Creatinine Clearance: 79.9 mL/min (by C-G formula based on Cr of 1.35). Liver Function Tests: No results for input(s): AST, ALT, ALKPHOS, BILITOT, PROT, ALBUMIN in the last 168 hours. No results for input(s): LIPASE, AMYLASE in the last 168 hours. No results for input(s): AMMONIA in the last 168 hours. Coagulation Profile:  Recent Labs Lab 02/18/16 1909 02/19/16 0543  INR 3.77* 4.40*   Cardiac Enzymes: No results for input(s): CKTOTAL, CKMB, CKMBINDEX, TROPONINI in the last 168 hours. BNP (last 3 results) No results for input(s): PROBNP in the last 8760 hours. HbA1C:  Recent Labs  02/18/16 1909  HGBA1C 8.7*   CBG:  Recent Labs Lab 02/18/16 2230 02/19/16 0743 02/19/16 1141  GLUCAP 196* 226* 181*   Lipid Profile: No results for input(s): CHOL, HDL, LDLCALC, TRIG, CHOLHDL, LDLDIRECT in the last 72 hours. Thyroid Function Tests: No results for input(s): TSH, T4TOTAL, FREET4, T3FREE, THYROIDAB in the last 72 hours. Anemia Panel: No results for input(s): VITAMINB12, FOLATE, FERRITIN, TIBC, IRON, RETICCTPCT in the last 72 hours. Urine analysis:    Component Value Date/Time   COLORURINE YELLOW 06/30/2010 1037   APPEARANCEUR CLOUDY* 06/30/2010 1037   LABSPEC 1.025 06/30/2010 1037   PHURINE 5.0 06/30/2010 1037    GLUCOSEU NEGATIVE 06/30/2010 1037   HGBUR LARGE* 06/30/2010 1037   BILIRUBINUR NEGATIVE 06/30/2010 1037   KETONESUR NEGATIVE 06/30/2010 1037   PROTEINUR NEGATIVE 06/30/2010 1037   UROBILINOGEN 0.2 06/30/2010 1037   NITRITE NEGATIVE 06/30/2010 1037   LEUKOCYTESUR SMALL* 06/30/2010 1037   Sepsis Labs: @LABRCNTIP (procalcitonin:4,lacticidven:4) ) Recent Results (from the past 240 hour(s))  Blood culture (routine x 2)     Status: None (Preliminary result)   Collection Time: 02/18/16  6:55 PM  Result Value Ref Range Status   Specimen Description BLOOD RIGHT ANTECUBITAL  Final   Special Requests BOTTLES DRAWN AEROBIC AND ANAEROBIC 10CC  Final   Culture PENDING  Incomplete   Report Status PENDING  Incomplete  MRSA PCR Screening  Status: None   Collection Time: 02/18/16 10:16 PM  Result Value Ref Range Status   MRSA by PCR NEGATIVE NEGATIVE Final    Comment:        The GeneXpert MRSA Assay (FDA approved for NASAL specimens only), is one component of a comprehensive MRSA colonization surveillance program. It is not intended to diagnose MRSA infection nor to guide or monitor treatment for MRSA infections.          Radiology Studies: Dg Foot 2 Views Right  02/18/2016  CLINICAL DATA:  Cyanosis and erythema involving distal right foot and toes. Diabetes. Evaluate for osteomyelitis. EXAM: RIGHT FOOT - 2 VIEW COMPARISON:  None. FINDINGS: There is no evidence of fracture or dislocation. No evidence of focal osteolysis or periostitis. There is no evidence of arthropathy or other focal bone abnormality. Soft tissue swelling is seen involving the great toe, without evidence of soft tissue gas or radiopaque foreign body. IMPRESSION: Soft tissue swelling of great toe. No radiographic evidence of osteomyelitis or other osseous abnormality. Electronically Signed   By: Earle Gell M.D.   On: 02/18/2016 16:59      Scheduled Meds: . carvedilol  25 mg Oral BID WC  . cefTRIAXone (ROCEPHIN)   IV  2 g Intravenous Q24H   And  . metronidazole  500 mg Intravenous Q8H  . docusate sodium  100 mg Oral BID  . feeding supplement (PRO-STAT SUGAR FREE 64)  30 mL Oral BID  . hydrALAZINE  25 mg Oral Q8H  . insulin aspart  0-15 Units Subcutaneous TID WC  . insulin aspart  0-5 Units Subcutaneous QHS  . sodium chloride flush  3 mL Intravenous Q12H   Continuous Infusions:    LOS: 1 day    Time spent in minutes: 44    [place], MD [facility] Pager: [email or web address] Password [facility] 02/19/2016, 12:06 PM

## 2016-02-19 NOTE — Consult Note (Signed)
ORTHOPAEDIC CONSULTATION  REQUESTING PHYSICIAN: Debbe Odea, MD  Chief Complaint: Cellulitis right great toe  HPI: Brian Martin is a 60 y.o. male who presents with 1 week history of cellulitis of his right great toe. Patient started on clindamycin on Tuesday was seen Friday without resolution of his symptoms and was referred to [facility] for evaluation and treatment.  Past Medical History  Diagnosis Date  . Atrial fibrillation ([place])   . CHF (congestive heart failure) (HCC)     chronic systolic CHF  . Diabetes mellitus     type 2  . Hypertension   . Chronic renal failure   . Amputation of leg    Past Surgical History  Procedure Laterality Date  . 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 Esophagogastroduodenoscopy N/A 06/14/2013    Procedure: ESOPHAGOGASTRODUODENOSCOPY (EGD);  Surgeon: Lear Ng, MD;  Location: [facility] ENDOSCOPY;  Service: Endoscopy;  Laterality: N/A;  . Colonoscopy N/A 06/15/2013    Procedure: COLONOSCOPY;  Surgeon: Lear Ng, MD;  Location: [facility] ENDOSCOPY;  Service: Endoscopy;  Laterality: N/A;   Social History   Social History  . Marital Status: Married    Spouse Name: N/A  . Number of Children: N/A  . Years of Education: N/A   Social History Main Topics  . Smoking status: Former Smoker    Quit date: 08/28/2001  . Smokeless tobacco: None  . Alcohol Use: No  . Drug Use: Yes    Special: Marijuana     Comment: no marijuana in the past month  . Sexual Activity: Not Asked   Other Topics Concern  . None   Social History Narrative   Family History  Problem Relation Age of Onset  . Diabetes Other    - negative except otherwise stated in the family history section No Known Allergies Prior to Admission medications   Medication Sig Start Date End Date Taking? Authorizing Provider  Ascorbic Acid (VITAMIN C) 100 MG tablet Take 100 mg by mouth daily.   Yes Historical Provider, MD  carvedilol (COREG) 25 MG tablet Take  25 mg by mouth 2 (two) times daily with a meal.   Yes Historical Provider, MD  clindamycin (CLEOCIN) 300 MG capsule Take 300 mg by mouth 3 (three) times daily.   Yes Historical Provider, MD  ferrous sulfate 325 (65 FE) MG tablet Take 1 tablet (325 mg total) by mouth daily with breakfast. 06/16/13  Yes Ivor Costa, MD  FLUCONAZOLE PO Take 1 tablet by mouth daily.   Yes Historical Provider, MD  furosemide (LASIX) 20 MG tablet Take 1 tablet (20 mg total) by mouth daily. 07/03/14  Yes Rande Brunt, NP  hydrALAZINE (APRESOLINE) 25 MG tablet TAKE ONE TABLET BY MOUTH THREE TIMES DAILY 03/15/15  Yes Jolaine Artist, MD  insulin glargine (LANTUS) 100 UNIT/ML injection Inject 25 Units into the skin at bedtime.  03/03/11  Yes Jolaine Artist, MD  isosorbide mononitrate (IMDUR) 60 MG 24 hr tablet Take 60 mg by mouth daily.   Yes Historical Provider, MD  pantoprazole (PROTONIX) 40 MG tablet Take 1 tablet (40 mg total) by mouth daily. 06/16/13  Yes Ivor Costa, MD  simvastatin (ZOCOR) 10 MG tablet Take 10 mg by mouth daily.   Yes Historical Provider, MD  warfarin (COUMADIN) 3 MG tablet Take 1 tablet (3 mg total) by mouth as directed. Take up to 1.5 tablets daily or As Directed by [facility]madin Clinic. Patient taking differently: Take  4.5 mg by mouth as directed. Take up to 1.5 tablets daily or As Directed by Coumadin Clinic. 10/08/15  Yes Jolaine Artist, MD  docusate sodium (COLACE) 100 MG capsule Take 1 capsule (100 mg total) by mouth 2 (two) times daily as needed for constipation. 06/16/13   Ivor Costa, MD  hydrALAZINE (APRESOLINE) 25 MG tablet TAKE ONE TABLET BY MOUTH THREE TIMES DAILY 01/06/16   Jolaine Artist, MD   Dg Foot 2 Views Right  02/18/2016  CLINICAL DATA:  Cyanosis and erythema involving distal right foot and toes. Diabetes. Evaluate for osteomyelitis. EXAM: RIGHT FOOT - 2 VIEW COMPARISON:  None. FINDINGS: There is no evidence of fracture or dislocation. No evidence of focal osteolysis or  periostitis. There is no evidence of arthropathy or other focal bone abnormality. Soft tissue swelling is seen involving the great toe, without evidence of soft tissue gas or radiopaque foreign body. IMPRESSION: Soft tissue swelling of great toe. No radiographic evidence of osteomyelitis or other osseous abnormality. Electronically Signed   By: Earle Gell M.D.   On: 02/18/2016 16:59   - pertinent xrays, CT, MRI studies were reviewed and independently interpreted  Positive ROS: All other systems have been reviewed and were otherwise negative with the exception of those mentioned in the HPI and as above.  Physical Exam: General: Alert, no acute distress Cardiovascular: No pedal edema Respiratory: No cyanosis, no use of accessory musculature GI: No organomegaly, abdomen is soft and non-tender Skin: Patient has cellulitis around the right great toe the skin wrinkles well there is no swelling no drainage Neurologic: Patient does not have protective sensation. Psychiatric: Patient is competent for consent with normal mood and affect Lymphatic: No axillary or cervical lymphadenopathy  MUSCULOSKELETAL:  On examination patient has a good dorsalis pedis and posterior tibial pulse he is extremely tender to palpation of the MTP joint with redness medially. There is no swelling the skin wrinkles well there is no open wound. Radiographs shows no evidence of osteomyelitis.  Assessment: Assessment: Redness swelling or pain right great toe MTP joint.  Plan: Plan: I will order a uric acid level to see if gout is a component of his symptoms. Patient seems to be responding well to his antibiotic treatment and I feel that this should resolve well with antibiotics. I will reevaluate tomorrow.  Thank you for the consult and the opportunity to see [name], [place] [phone] 2:26 PM

## 2016-02-19 NOTE — Progress Notes (Signed)
ANTICOAGULATION CONSULT NOTE   Pharmacy Consult for Coumadin Indication: atrial fibrillation  No Known Allergies  Patient Measurements: Height: 6\' 1"  (185.4 cm) Weight: 271 lb 4 oz (123.038 kg) IBW/kg (Calculated) : 79.9   Vital Signs: Temp: 98.6 F (37 C) (06/24 0517) BP: 160/80 mmHg (06/24 0851) Pulse Rate: 73 (06/24 0851)  Labs:  Recent Labs  02/18/16 1349 02/18/16 1909 02/19/16 0543 02/19/16 0756  HGB 13.7  --   --  13.6  HCT 42.4  --   --  41.5  PLT 283  --   --  252  LABPROT  --  36.4* 40.8*  --   INR  --  3.77* 4.40*  --   CREATININE 1.48*  --   --  1.35*    Estimated Creatinine Clearance: 79.9 mL/min (by C-G formula based on Cr of 1.35).   Medical History: Past Medical History  Diagnosis Date  . Atrial fibrillation ([place])   . CHF (congestive heart failure) (HCC)     chronic systolic CHF  . Diabetes mellitus     type 2  . Hypertension   . Chronic renal failure   . Amputation of leg     Assessment: 60 year old male admitted with diabetic foot ulcer on Coumadin PTA for Afib.  Admit INR = 3.77 with PTA dose of 4.5 mg po daily.  INR today 4.4 (from 3.7) with last dose received on 6/23. INR elevation could be secondary to fluconazole that he was receiving as outpatient as likely to early to see affects of Flagyl. CBC stable.   Goal of Therapy:  INR 2-3 Monitor platelets by anticoagulation protocol: Yes   Plan:  1. No Coumadin tonight 2. Daily INR  Vincenza Hews, PharmD, BCPS 02/19/2016, 12:39 PM Pager: [phone]

## 2016-02-20 ENCOUNTER — Inpatient Hospital Stay (HOSPITAL_COMMUNITY): Payer: Medicaid Other

## 2016-02-20 DIAGNOSIS — L97509 Non-pressure chronic ulcer of other part of unspecified foot with unspecified severity: Secondary | ICD-10-CM

## 2016-02-20 LAB — GLUCOSE, CAPILLARY
GLUCOSE-CAPILLARY: 207 mg/dL — AB (ref 65–99)
GLUCOSE-CAPILLARY: 188 mg/dL — AB (ref 65–99)
Glucose-Capillary: 219 mg/dL — ABNORMAL HIGH (ref 65–99)
Glucose-Capillary: 252 mg/dL — ABNORMAL HIGH (ref 65–99)

## 2016-02-20 LAB — PROTIME-INR
INR: 2.96 — AB (ref 0.00–1.49)
PROTHROMBIN TIME: 30.3 s — AB (ref 11.6–15.2)

## 2016-02-20 MED ORDER — WARFARIN SODIUM 2 MG PO TABS
2.0000 mg | ORAL_TABLET | Freq: Once | ORAL | Status: AC
  Administered 2016-02-20: 2 mg via ORAL
  Filled 2016-02-20: qty 1

## 2016-02-20 MED ORDER — WARFARIN - PHARMACIST DOSING INPATIENT: Freq: Every day | Status: DC

## 2016-02-20 MED ORDER — VITAMIN C 250 MG PO TABS
250.0000 mg | ORAL_TABLET | Freq: Every day | ORAL | Status: DC
  Administered 2016-02-20 – 2016-02-21 (×2): 250 mg via ORAL
  Filled 2016-02-20 (×2): qty 1

## 2016-02-20 MED ORDER — ISOSORBIDE MONONITRATE ER 60 MG PO TB24
60.0000 mg | ORAL_TABLET | Freq: Every day | ORAL | Status: DC
  Administered 2016-02-20 – 2016-02-21 (×2): 60 mg via ORAL
  Filled 2016-02-20 (×2): qty 1

## 2016-02-20 MED ORDER — HYDRALAZINE HCL 25 MG PO TABS: 25.0000 mg | ORAL_TABLET | Freq: Three times a day (TID) | ORAL | Status: DC

## 2016-02-20 MED ORDER — INSULIN GLARGINE 100 UNIT/ML ~~LOC~~ SOLN
20.0000 [IU] | Freq: Every day | SUBCUTANEOUS | Status: DC
  Administered 2016-02-20: 20 [IU] via SUBCUTANEOUS
  Filled 2016-02-20 (×2): qty 0.2

## 2016-02-20 MED ORDER — CARVEDILOL 25 MG PO TABS: 25.0000 mg | ORAL_TABLET | Freq: Two times a day (BID) | ORAL | Status: DC

## 2016-02-20 MED ORDER — FUROSEMIDE 20 MG PO TABS
20.0000 mg | ORAL_TABLET | Freq: Every day | ORAL | Status: DC
  Administered 2016-02-20 – 2016-02-21 (×2): 20 mg via ORAL
  Filled 2016-02-20 (×2): qty 1

## 2016-02-20 MED ORDER — PANTOPRAZOLE SODIUM 40 MG PO TBEC
40.0000 mg | DELAYED_RELEASE_TABLET | Freq: Every day | ORAL | Status: DC
  Administered 2016-02-20 – 2016-02-21 (×2): 40 mg via ORAL
  Filled 2016-02-20 (×2): qty 1

## 2016-02-20 MED ORDER — COLCHICINE 0.6 MG PO TABS
0.6000 mg | ORAL_TABLET | Freq: Two times a day (BID) | ORAL | Status: DC
  Administered 2016-02-20 – 2016-02-21 (×3): 0.6 mg via ORAL
  Filled 2016-02-20 (×3): qty 1

## 2016-02-20 NOTE — Progress Notes (Signed)
Patient has no prior knowledge of CPAP or OSA and would like to not wear it tonight. He possibly wants to try it if it is noted he shows to have possible OSA and Would like a sleep study done so he could be put on the proper settings and mask fit. If he changes his mind he will try it here, but he states he sleeps with his mouth open and without a sleep study we are not able to use a full face mack on the floor. Will continue to monitor and treat patient as needed.

## 2016-02-20 NOTE — Progress Notes (Signed)
VASCULAR LAB PRELIMINARY  ARTERIAL  ABI completed:Right ABI indicates adequate blood flow to the lower extremity    RIGHT    LEFT    PRESSURE WAVEFORM  PRESSURE WAVEFORM  BRACHIAL 160 T BRACHIAL 166 T  DP   DP AKA   AT 164 T AT    PT 161 B PT    PER   PER    GREAT TOE  NA GREAT TOE  NA    RIGHT LEFT  ABI 0.99 AKA     Netha Dafoe, RVT 02/20/2016, 10:48 AM

## 2016-02-20 NOTE — Progress Notes (Signed)
Patient ID: Brian Martin, male   DOB: 07/03/1956, 60 y.o.   MRN: [id number] Uric acid 6.8, I ordered colcrys 0.6 mg BID, I'll reevaluate Monday AM.

## 2016-02-20 NOTE — Progress Notes (Signed)
Patient refused CPAP tonight. There Isn;t a machine in the room at this time. Would consider sleep study to see if he has sleep apnea

## 2016-02-20 NOTE — Progress Notes (Signed)
[place] for Coumadin Indication: atrial fibrillation  No Known Allergies  Patient Measurements: Height: 6\' 1"  (185.4 cm) Weight: 271 lb 4 oz (123.038 kg) IBW/kg (Calculated) : 79.9   Vital Signs: Temp: 98.2 F (36.8 C) (06/25 0604) BP: 151/73 mmHg (06/25 0604) Pulse Rate: 73 (06/25 0604)  Labs:  Recent Labs  02/18/16 1349 02/18/16 1909 02/19/16 0543 02/19/16 0756 02/20/16 0357  HGB 13.7  --   --  13.6  --   HCT 42.4  --   --  41.5  --   PLT 283  --   --  252  --   LABPROT  --  36.4* 40.8*  --  30.3*  INR  --  3.77* 4.40*  --  2.96*  CREATININE 1.48*  --   --  1.35*  --     Estimated Creatinine Clearance: 79.9 mL/min (by C-G formula based on Cr of 1.35).   Medical History: Past Medical History  Diagnosis Date  . Atrial fibrillation ([place])   . CHF (congestive heart failure) (HCC)     chronic systolic CHF  . Diabetes mellitus     type 2  . Hypertension   . Chronic renal failure   . Amputation of leg     Assessment: 60 year old male admitted with diabetic foot ulcer on Coumadin PTA for Afib.  Admit INR = 3.77 with PTA dose of 4.5 mg po daily.  INR down to 2.96 from 4.4 the previous day.  CBC from 6/24 stable and no immediate surgical plans noted.  Remains on Ceftriaxone and Flagyl for treatment of cellulitis of R great toe and was on fluconazole PTA.  Goal of Therapy:  INR 2-3 Monitor platelets by anticoagulation protocol: Yes   Plan:  1. Resume warfarin at lower dose of 2 mg x 1 this evening in light of elevated INR on admission and known DI with Flagyl 2. Daily INR  Vincenza Hews, PharmD, BCPS 02/20/2016, 1:01 PM Pager: [phone]

## 2016-02-20 NOTE — Progress Notes (Signed)
PROGRESS NOTE    Brian Martin  [id number] DOB: May 20, 1956 DOA: 02/18/2016  PCP: Marco Collie, MD   Brief Narrative:  Brian Martin is a 60 y.o. male with medical history significant of diabetes mellitus type 2 on insulin, atrial fibrillation on Coumadin, chronic systolic heart failure on Lasix, hypertension, chronic kidney disease stage III, left BKA. The patient states about a week ago he developed a blister on the right first toe which subsequently progressed to redness that went up his foot. He was seen by his PCP who prescribed clindamycin. 2 days later he went to see his PCP because an area on his toe had turned Brian. At that point he was put on Diflucan. He went back to see his PCP today. The Brian patch in his toe and foot had grown and PCP suspected that it may contain pus and therefore sent him into the ER. The area was debrided in ER and according to the ER doctor, it is difficult to tell if it was dead skin tissue versus pus however, no drainage from the foot was encountered. The patient has not had significant pain in his foot but has noted burning for the past 3-4 days. There is a small area on the top of the toe which is becoming bluish.  Subjective: No change in symptoms.   Assessment & Plan:   Principal Problem:   Cellulitis - right first toe- no osteomyelitis on xray -erythema and swelling significantly  improved on current antibiotics- Rocephin and Flagyl - ortho does not recommend any further intervention- Dr Sharol Given has started Colchicine- he will evaluate tomorrow  Active Problems: Active Problems: Diabetes mellitus type 2, uncontrolled -Continue current doses of Lantus and add sliding scale - hemoglobin A1c 8.7- was 8.6 1 mo ago- has had dietary non-compliance lately   Essential hypertension - Hydralazine, Imdur, carvedilol   Atrial fibrillation  -Continue carvedilol  - INR elevated -coumadin per pharmacy   CHRONIC KIDNEY DISEASE STAGE III  -  stable   Chronic diastolic CHF (congestive heart failure) -He has grade 1 diastolic dysfunction per echo in 9/16 -Continue low-dose Lasix    DVT prophylaxis: coumadin Code Status: full code Family Communication:  Disposition Plan: hopefully home tomorrow Consultants:   ortho Procedures:   none Antimicrobials:  Anti-infectives    Start     Dose/Rate Route Frequency Ordered Stop   02/18/16 1800  cefTRIAXone (ROCEPHIN) 2 g in dextrose 5 % 50 mL IVPB     2 g 100 mL/hr over 30 Minutes Intravenous Every 24 hours 02/18/16 1742     02/18/16 1800  metroNIDAZOLE (FLAGYL) IVPB 500 mg     500 mg 100 mL/hr over 60 Minutes Intravenous Every 8 hours 02/18/16 1742     02/18/16 1745  piperacillin-tazobactam (ZOSYN) IVPB 3.375 g  Status:  Discontinued     3.375 g 100 mL/hr over 30 Minutes Intravenous  Once 02/18/16 1734 02/18/16 1742       Objective: Filed Vitals:   02/19/16 1332 02/19/16 2123 02/20/16 0604 02/20/16 1309  BP: 146/89 149/80 151/73 175/79  Pulse: 81 71 73 74  Temp: 97.6 F (36.4 C) 98.1 F (36.7 C) 98.2 F (36.8 C) 98.1 F (36.7 C)  TempSrc:      Resp: 18 16 20 19   Height:      Weight:      SpO2: 98% 98% 98% 100%    Intake/Output Summary (Last 24 hours) at 02/20/16 1653 Last data filed at 02/20/16 1450  Gross per  24 hour  Intake    520 ml  Output    820 ml  Net   -300 ml   Filed Weights   02/18/16 1334  Weight: 123.038 kg (271 lb 4 oz)    Examination: General exam: Appears comfortable  HEENT: PERRLA, oral mucosa moist, no sclera icterus or thrush Respiratory system: Clear to auscultation. Respiratory effort normal. Cardiovascular system: S1 & S2 heard, RRR.  No murmurs  Gastrointestinal system: Abdomen soft, non-tender, nondistended. Normal bowel sound. No organomegaly Central nervous system: Alert and oriented. No focal neurological deficits. Extremities: No cyanosis, clubbing or edema Skin: Erythema and swelling of right foot in the area of the  first metatarsal head and the first toe has nearly resolved.  There is no drainage noted. It is still quite tender to the touch-  there is no fluctuation noted. There is a small area of bluish discoloration on the dorsum of the toe. Psychiatry:  Mood & affect appropriate.      Data Reviewed: I have personally reviewed following labs and imaging studies  CBC:  Recent Labs Lab 02/18/16 1349 02/19/16 0756  WBC 11.5* 10.0  HGB 13.7 13.6  HCT 42.4 41.5  MCV 91.6 91.0  PLT 283 [id number]   Basic Metabolic Panel:  Recent Labs Lab 02/18/16 1349 02/19/16 0756  NA 134* 135  K 4.3 4.6  CL 102 101  CO2 24 24  GLUCOSE 261* 199*  BUN 15 18  CREATININE 1.48* 1.35*  CALCIUM 8.9 9.2   GFR: Estimated Creatinine Clearance: 79.9 mL/min (by C-G formula based on Cr of 1.35). Liver Function Tests: No results for input(s): AST, ALT, ALKPHOS, BILITOT, PROT, ALBUMIN in the last 168 hours. No results for input(s): LIPASE, AMYLASE in the last 168 hours. No results for input(s): AMMONIA in the last 168 hours. Coagulation Profile:  Recent Labs Lab 02/18/16 1909 02/19/16 0543 02/20/16 0357  INR 3.77* 4.40* 2.96*   Cardiac Enzymes: No results for input(s): CKTOTAL, CKMB, CKMBINDEX, TROPONINI in the last 168 hours. BNP (last 3 results) No results for input(s): PROBNP in the last 8760 hours. HbA1C:  Recent Labs  02/18/16 1909  HGBA1C 8.7*   CBG:  Recent Labs Lab 02/19/16 1630 02/19/16 2122 02/20/16 0753 02/20/16 1136 02/20/16 1640  GLUCAP 233* 183* 219* 207* 188*   Lipid Profile: No results for input(s): CHOL, HDL, LDLCALC, TRIG, CHOLHDL, LDLDIRECT in the last 72 hours. Thyroid Function Tests: No results for input(s): TSH, T4TOTAL, FREET4, T3FREE, THYROIDAB in the last 72 hours. Anemia Panel: No results for input(s): VITAMINB12, FOLATE, FERRITIN, TIBC, IRON, RETICCTPCT in the last 72 hours. Urine analysis:    Component Value Date/Time   COLORURINE YELLOW 06/30/2010 1037    APPEARANCEUR CLOUDY* 06/30/2010 1037   LABSPEC 1.025 06/30/2010 1037   PHURINE 5.0 06/30/2010 1037   GLUCOSEU NEGATIVE 06/30/2010 1037   HGBUR LARGE* 06/30/2010 1037   BILIRUBINUR NEGATIVE 06/30/2010 1037   KETONESUR NEGATIVE 06/30/2010 1037   PROTEINUR NEGATIVE 06/30/2010 1037   UROBILINOGEN 0.2 06/30/2010 1037   NITRITE NEGATIVE 06/30/2010 1037   LEUKOCYTESUR SMALL* 06/30/2010 1037   Sepsis Labs: @LABRCNTIP (procalcitonin:4,lacticidven:4) ) Recent Results (from the past 240 hour(s))  Blood culture (routine x 2)     Status: None (Preliminary result)   Collection Time: 02/18/16  6:55 PM  Result Value Ref Range Status   Specimen Description BLOOD RIGHT ANTECUBITAL  Final   Special Requests BOTTLES DRAWN AEROBIC AND ANAEROBIC 10CC  Final   Culture NO GROWTH 2 DAYS  Final  Report Status PENDING  Incomplete  Blood culture (routine x 2)     Status: None (Preliminary result)   Collection Time: 02/18/16  7:03 PM  Result Value Ref Range Status   Specimen Description BLOOD LEFT ANTECUBITAL  Final   Special Requests IN PEDIATRIC BOTTLE 3CC  Final   Culture NO GROWTH 2 DAYS  Final   Report Status PENDING  Incomplete  MRSA PCR Screening     Status: None   Collection Time: 02/18/16 10:16 PM  Result Value Ref Range Status   MRSA by PCR NEGATIVE NEGATIVE Final    Comment:        The GeneXpert MRSA Assay (FDA approved for NASAL specimens only), is one component of a comprehensive MRSA colonization surveillance program. It is not intended to diagnose MRSA infection nor to guide or monitor treatment for MRSA infections.          Radiology Studies: No results found.    Scheduled Meds: . carvedilol  25 mg Oral BID WC  . cefTRIAXone (ROCEPHIN)  IV  2 g Intravenous Q24H   And  . metronidazole  500 mg Intravenous Q8H  . colchicine  0.6 mg Oral BID  . docusate sodium  100 mg Oral BID  . feeding supplement (PRO-STAT SUGAR FREE 64)  30 mL Oral BID  . furosemide  20 mg Oral Daily   . hydrALAZINE  25 mg Oral Q8H  . insulin aspart  0-15 Units Subcutaneous TID WC  . insulin aspart  0-5 Units Subcutaneous QHS  . insulin glargine  20 Units Subcutaneous QHS  . isosorbide mononitrate  60 mg Oral Daily  . pantoprazole  40 mg Oral Daily  . sodium chloride flush  3 mL Intravenous Q12H  . vitamin C  250 mg Oral Daily  . warfarin  2 mg Oral ONCE-1800  . Warfarin - Pharmacist Dosing Inpatient   Does not apply q1800   Continuous Infusions:    LOS: 2 days    Time spent in minutes: 67    [place], MD [facility] Pager: [email or web address] Password [facility] 02/20/2016, 4:53 PM

## 2016-02-21 DIAGNOSIS — I482 Chronic atrial fibrillation: Secondary | ICD-10-CM

## 2016-02-21 DIAGNOSIS — I10 Essential (primary) hypertension: Secondary | ICD-10-CM

## 2016-02-21 DIAGNOSIS — L03031 Cellulitis of right toe: Secondary | ICD-10-CM

## 2016-02-21 LAB — BASIC METABOLIC PANEL
ANION GAP: 6 (ref 5–15)
BUN: 23 mg/dL — ABNORMAL HIGH (ref 6–20)
CALCIUM: 8.8 mg/dL — AB (ref 8.9–10.3)
CHLORIDE: 103 mmol/L (ref 101–111)
CO2: 25 mmol/L (ref 22–32)
Creatinine, Ser: 1.5 mg/dL — ABNORMAL HIGH (ref 0.61–1.24)
GFR calc non Af Amer: 49 mL/min — ABNORMAL LOW (ref >60)
GFR, EST AFRICAN AMERICAN: 57 mL/min — AB (ref >60)
GLUCOSE: 188 mg/dL — AB (ref 65–99)
POTASSIUM: 4.8 mmol/L (ref 3.5–5.1)
Sodium: 134 mmol/L — ABNORMAL LOW (ref 135–145)

## 2016-02-21 LAB — PROTIME-INR
INR: 2.49 — ABNORMAL HIGH (ref 0.00–1.49)
Prothrombin Time: 26.6 seconds — ABNORMAL HIGH (ref 11.6–15.2)

## 2016-02-21 LAB — GLUCOSE, CAPILLARY
GLUCOSE-CAPILLARY: 265 mg/dL — AB (ref 65–99)
Glucose-Capillary: 181 mg/dL — ABNORMAL HIGH (ref 65–99)

## 2016-02-21 MED ORDER — WARFARIN SODIUM 2 MG PO TABS
2.0000 mg | ORAL_TABLET | Freq: Once | ORAL | Status: DC
Start: 2016-02-21 — End: 2016-02-21
  Filled 2016-02-21: qty 1

## 2016-02-21 MED ORDER — CLINDAMYCIN HCL 300 MG PO CAPS: 300.0000 mg | ORAL_CAPSULE | Freq: Three times a day (TID) | ORAL | Status: DC

## 2016-02-21 MED ORDER — WARFARIN SODIUM 3 MG PO TABS: 1.5000 mg | ORAL_TABLET | ORAL | Status: DC

## 2016-02-21 MED ORDER — INSULIN GLARGINE 100 UNIT/ML ~~LOC~~ SOLN: 20.0000 [IU] | Freq: Every day | SUBCUTANEOUS | Status: DC

## 2016-02-21 MED ORDER — DOCUSATE SODIUM 100 MG PO CAPS: 100.0000 mg | ORAL_CAPSULE | Freq: Two times a day (BID) | ORAL | Status: DC

## 2016-02-21 MED ORDER — COLCHICINE 0.6 MG PO TABS
0.6000 mg | ORAL_TABLET | Freq: Two times a day (BID) | ORAL | Status: DC
Start: 2016-02-21 — End: 2017-11-21

## 2016-02-21 NOTE — Discharge Summary (Signed)
Physician Discharge Summary  Brian Martin [id number] DOB: 11/09/1955 DOA: 02/18/2016  PCP: Marco Collie, MD  Admit date: 02/18/2016 Discharge date: 02/21/2016  Time spent: 35 minutes  Recommendations for Outpatient Follow-up:  1. He was admitted for right toe cellulitis, improving with IV antibiotics. He was discharged on Clindamycin and given close follow up at Dr Jess Barters office.  2. He is on anticoagulation with warfarin, had INR of 2.49 on day of discharge, being on antimicrobial therapy pharmacy recommended decreasing his warfarin dose to 1.5 mg PO q daily. Please follow up on PT/INR 3. Please follow up on BMP, has history of stage III CKD, had Cr of 1.5 on day of discharge.  4. Please followup on his blood sugars   Discharge Diagnoses:  Principal Problem:   Cellulitis Active Problems:   Essential hypertension   Atrial fibrillation (HCC)   CHRONIC KIDNEY DISEASE STAGE III (MODERATE)   Chronic diastolic CHF (congestive heart failure) ([place])   Discharge Condition: Stable   Diet recommendation: Carb Mod Diet  Filed Weights   02/18/16 1334  Weight: 123.038 kg (271 lb 4 oz)    History of present illness:  Brian Martin is a 60 y.o. male with medical history significant of diabetes mellitus type 2 on insulin, atrial fibrillation on Coumadin, chronic systolic heart failure on Lasix, hypertension, chronic kidney disease stage III, left BKA. The patient states about a week ago he developed a blister on the right first toe which subsequently progressed to redness that went up his foot. He was seen by his PCP who prescribed clindamycin. 2 days later he went to see his PCP because an area on his toe had turned white. At that point he was put on Diflucan. He went back to see his PCP today. The white patch in his toe and foot had grown and PCP suspected that it may contain pus and therefore sent him into the ER. The area was debrided in ER and according to the ER doctor, it is difficult  to tell if it was dead skin tissue versus pus however, no drainage from the foot was encountered. The patient has not had significant pain in his foot but has noted burning for the past 3-4 days. There is a small area on the top of the toe which is becoming bluish. In regards to his diabetes, he is very strict was taking his Lantus but over the past couple of months, due to a job change, he has not been as adherent with his diet as he should've been. Last hemoglobin A1c was done a month ago and was 8.6.  Hospital Course:   Brian Martin is a 60 y.o. male with medical history significant of diabetes mellitus type 2 on insulin, atrial fibrillation on Coumadin, chronic systolic heart failure on Lasix, hypertension, chronic kidney disease stage III, left BKA. The patient states about a week ago he developed a blister on the right first toe which subsequently progressed to redness that went up his foot. He was seen by his PCP who prescribed clindamycin. 2 days later he went to see his PCP because an area on his toe had turned white. At that point he was put on Diflucan. He went back to see his PCP today. The white patch in his toe and foot had grown and PCP suspected that it may contain pus and therefore sent him into the ER. The area was debrided in ER although frank pus was not observed. He was started on empiric IV  antibiotic therapy with Flagyl and ceftriaxone. Further workup included a foot x-ray which reveals soft tissue swelling of great toe without radiographic evidence of osteomyelitis or other osseous abnormality. During this hospitalization he was followed by Dr.Duda of orthopedic surgery who did not feel that he required surgical intervention. Over the course of his hospitalization the right toe cellulitis improved. He remained hemodynamically stable, afebrile, and by 6 11/01/2015 was felt to be doing well enough to go home on oral antimicrobial therapy by Dr Sharol Given. It appears she had only been on 2  days of clindamycin prior to this hospitalization. He was put back on 300 mg by mouth 3 times a day of clindamycin on discharge for 1 week. He was given follow-up appointment at Dr Jess Barters office.    Consultations:  Dr Sharol Given Orthopedic Surgery  Discharge Exam: Filed Vitals:   02/20/16 2314 02/21/16 0514  BP: 123/60 139/73  Pulse:  72  Temp:  97.4 F (36.3 C)  Resp:  16    General exam: Appears comfortable  HEENT: PERRLA, oral mucosa moist, no sclera icterus or thrush Respiratory system: Clear to auscultation. Respiratory effort normal. Cardiovascular system: S1 & S2 heard, RRR. No murmurs  Gastrointestinal system: Abdomen soft, non-tender, nondistended. Normal bowel sound. No organomegaly Central nervous system: Alert and oriented. No focal neurological deficits. Extremities: No cyanosis, clubbing or edema Skin: Right great toe seems improved, there is mild erythema however no evidence of fluctuance or drainage. There is some associated swelling/edema Psychiatry: Mood & affect appropriate.   Discharge Instructions   Discharge Instructions    Call MD for:  difficulty breathing, headache or visual disturbances    Complete by:  As directed      Call MD for:  extreme fatigue    Complete by:  As directed      Call MD for:  hives    Complete by:  As directed      Call MD for:  persistant dizziness or light-headedness    Complete by:  As directed      Call MD for:  persistant nausea and vomiting    Complete by:  As directed      Call MD for:  redness, tenderness, or signs of infection (pain, swelling, redness, odor or green/yellow discharge around incision site)    Complete by:  As directed      Call MD for:  severe uncontrolled pain    Complete by:  As directed      Call MD for:  temperature >100.4    Complete by:  As directed      Call MD for:    Complete by:  As directed      Diet - low sodium heart healthy    Complete by:  As directed      Increase activity slowly     Complete by:  As directed           Current Discharge Medication List    START taking these medications   Details  colchicine 0.6 MG tablet Take 1 tablet (0.6 mg total) by mouth 2 (two) times daily. Qty: 60 tablet, Refills: 0      CONTINUE these medications which have CHANGED   Details  clindamycin (CLEOCIN) 300 MG capsule Take 1 capsule (300 mg total) by mouth 3 (three) times daily. Qty: 21 capsule, Refills: 0    docusate sodium (COLACE) 100 MG capsule Take 1 capsule (100 mg total) by mouth 2 (two) times daily. Qty: 10 capsule, Refills:  0    insulin glargine (LANTUS) 100 UNIT/ML injection Inject 0.2 mLs (20 Units total) into the skin at bedtime. Qty: 10 mL, Refills: 11    warfarin (COUMADIN) 3 MG tablet Take 0.5 tablets (1.5 mg total) by mouth as directed. Take up to 1.5 tablets daily or As Directed by Coumadin Clinic. Qty: 50 tablet, Refills: 3      CONTINUE these medications which have NOT CHANGED   Details  Ascorbic Acid (VITAMIN C) 100 MG tablet Take 100 mg by mouth daily.    carvedilol (COREG) 25 MG tablet Take 25 mg by mouth 2 (two) times daily with a meal.    ferrous sulfate 325 (65 FE) MG tablet Take 1 tablet (325 mg total) by mouth daily with breakfast. Qty: 30 tablet, Refills: 5    furosemide (LASIX) 20 MG tablet Take 1 tablet (20 mg total) by mouth daily. Qty: 30 tablet, Refills: 6    hydrALAZINE (APRESOLINE) 25 MG tablet TAKE ONE TABLET BY MOUTH THREE TIMES DAILY Qty: 90 tablet, Refills: 0    isosorbide mononitrate (IMDUR) 60 MG 24 hr tablet Take 60 mg by mouth daily.    pantoprazole (PROTONIX) 40 MG tablet Take 1 tablet (40 mg total) by mouth daily. Qty: 30 tablet, Refills: 5    simvastatin (ZOCOR) 10 MG tablet Take 10 mg by mouth daily.      STOP taking these medications     FLUCONAZOLE PO        No Known Allergies Follow-up Information    Follow up with DUDA,MARCUS V, MD In 1 week.   Specialty:  Orthopedic Surgery   Contact information:    [place] [address] [phone]       Follow up with [facility], MD In 1 week.   Specialty:  Family Medicine   Contact information:   [place] [place] [id number] [phone]        The results of significant diagnostics from this hospitalization (including imaging, microbiology, ancillary and laboratory) are listed below for reference.    Significant Diagnostic Studies: Dg Foot 2 Views Right  02/18/2016  CLINICAL DATA:  Cyanosis and erythema involving distal right foot and toes. Diabetes. Evaluate for osteomyelitis. EXAM: RIGHT FOOT - 2 VIEW COMPARISON:  None. FINDINGS: There is no evidence of fracture or dislocation. No evidence of focal osteolysis or periostitis. There is no evidence of arthropathy or other focal bone abnormality. Soft tissue swelling is seen involving the great toe, without evidence of soft tissue gas or radiopaque foreign body. IMPRESSION: Soft tissue swelling of great toe. No radiographic evidence of osteomyelitis or other osseous abnormality. Electronically Signed   By: Earle Gell M.D.   On: 02/18/2016 16:59    Microbiology: Recent Results (from the past 240 hour(s))  Blood culture (routine x 2)     Status: None (Preliminary result)   Collection Time: 02/18/16  6:55 PM  Result Value Ref Range Status   Specimen Description BLOOD RIGHT ANTECUBITAL  Final   Special Requests BOTTLES DRAWN AEROBIC AND ANAEROBIC 10CC  Final   Culture NO GROWTH 2 DAYS  Final   Report Status PENDING  Incomplete  Blood culture (routine x 2)     Status: None (Preliminary result)   Collection Time: 02/18/16  7:03 PM  Result Value Ref Range Status   Specimen Description BLOOD LEFT ANTECUBITAL  Final   Special Requests IN PEDIATRIC BOTTLE 3CC  Final   Culture NO GROWTH 2 DAYS  Final  Report Status PENDING  Incomplete  MRSA PCR Screening     Status: None   Collection Time: 02/18/16 10:16 PM  Result Value Ref Range Status   MRSA by PCR  NEGATIVE NEGATIVE Final    Comment:        The GeneXpert MRSA Assay (FDA approved for NASAL specimens only), is one component of a comprehensive MRSA colonization surveillance program. It is not intended to diagnose MRSA infection nor to guide or monitor treatment for MRSA infections.      Labs: Basic Metabolic Panel:  Recent Labs Lab 02/18/16 1349 02/19/16 0756 02/21/16 0630  NA 134* 135 134*  K 4.3 4.6 4.8  CL 102 101 103  CO2 24 24 25   GLUCOSE 261* 199* 188*  BUN 15 18 23*  CREATININE 1.48* 1.35* 1.50*  CALCIUM 8.9 9.2 8.8*   Liver Function Tests: No results for input(s): AST, ALT, ALKPHOS, BILITOT, PROT, ALBUMIN in the last 168 hours. No results for input(s): LIPASE, AMYLASE in the last 168 hours. No results for input(s): AMMONIA in the last 168 hours. CBC:  Recent Labs Lab 02/18/16 1349 02/19/16 0756  WBC 11.5* 10.0  HGB 13.7 13.6  HCT 42.4 41.5  MCV 91.6 91.0  PLT 283 252   Cardiac Enzymes: No results for input(s): CKTOTAL, CKMB, CKMBINDEX, TROPONINI in the last 168 hours. BNP: BNP (last 3 results) No results for input(s): BNP in the last 8760 hours.  ProBNP (last 3 results) No results for input(s): PROBNP in the last 8760 hours.  CBG:  Recent Labs Lab 02/20/16 0753 02/20/16 1136 02/20/16 1640 02/20/16 2052 02/21/16 0746  GLUCAP 219* 207* 188* 252* 181*       Signed:  Kelvin Cellar MD.  [facility] 02/21/2016, 11:34 AM

## 2016-02-21 NOTE — Progress Notes (Signed)
Nsg Discharge Note  Admit Date:  02/18/2016 Discharge date: 02/21/2016   Diyari Caminero [facility] to be D/C'd Home per MD order.  AVS completed.  Copy for chart, and copy for patient signed, and dated. Patient/caregiver able to verbalize understanding.  Discharge Medication:   Medication List    STOP taking these medications        FLUCONAZOLE PO      TAKE these medications        carvedilol 25 MG tablet  Commonly known as:  COREG  Take 25 mg by mouth 2 (two) times daily with a meal.     clindamycin 300 MG capsule  Commonly known as:  CLEOCIN  Take 1 capsule (300 mg total) by mouth 3 (three) times daily.     colchicine 0.6 MG tablet  Take 1 tablet (0.6 mg total) by mouth 2 (two) times daily.     docusate sodium 100 MG capsule  Commonly known as:  COLACE  Take 1 capsule (100 mg total) by mouth 2 (two) times daily.     ferrous sulfate 325 (65 FE) MG tablet  Take 1 tablet (325 mg total) by mouth daily with breakfast.     furosemide 20 MG tablet  Commonly known as:  LASIX  Take 1 tablet (20 mg total) by mouth daily.     hydrALAZINE 25 MG tablet  Commonly known as:  APRESOLINE  TAKE ONE TABLET BY MOUTH THREE TIMES DAILY     insulin glargine 100 UNIT/ML injection  Commonly known as:  LANTUS  Inject 0.2 mLs (20 Units total) into the skin at bedtime.     isosorbide mononitrate 60 MG 24 hr tablet  Commonly known as:  IMDUR  Take 60 mg by mouth daily.     pantoprazole 40 MG tablet  Commonly known as:  PROTONIX  Take 1 tablet (40 mg total) by mouth daily.     simvastatin 10 MG tablet  Commonly known as:  ZOCOR  Take 10 mg by mouth daily.     vitamin C 100 MG tablet  Take 100 mg by mouth daily.     warfarin 3 MG tablet  Commonly known as:  COUMADIN  Take 0.5 tablets (1.5 mg total) by mouth as directed. Take up to 1.5 tablets daily or As Directed by Coumadin Clinic.        Discharge Assessment: Filed Vitals:   02/20/16 2314 02/21/16 0514  BP: 123/60 139/73  Pulse:   72  Temp:  97.4 F (36.3 C)  Resp:  16   Skin clean, dry and intact without evidence of skin break down, no evidence of skin tears noted. IV catheter discontinued intact. Site without signs and symptoms of complications - no redness or edema noted at insertion site, patient denies c/o pain - only slight tenderness at site.  Dressing with slight pressure applied.  D/c Instructions-Education: Discharge instructions given to patient/family with verbalized understanding. D/c education completed with patient/family including follow up instructions, medication list, d/c activities limitations if indicated, with other d/c instructions as indicated by MD - patient able to verbalize understanding, all questions fully answered. Patient instructed to return to ED, call 911, or call MD for any changes in condition.  Patient escorted via [place], and D/C home via private auto.  Dayle Points, RN 02/21/2016 1:46 PM

## 2016-02-21 NOTE — Progress Notes (Signed)
[facility] for Coumadin Indication: atrial fibrillation  No Known Allergies  Patient Measurements: Height: 6\' 1"  (185.4 cm) Weight: 271 lb 4 oz (123.038 kg) IBW/kg (Calculated) : 79.9   Vital Signs: Temp: 97.4 F (36.3 C) (06/26 0514) BP: 139/73 mmHg (06/26 0514) Pulse Rate: 72 (06/26 0514)  Labs:  Recent Labs  02/18/16 1349  02/19/16 0543 02/19/16 0756 02/20/16 0357 02/21/16 0630  HGB 13.7  --   --  13.6  --   --   HCT 42.4  --   --  41.5  --   --   PLT 283  --   --  252  --   --   LABPROT  --   < > 40.8*  --  30.3* 26.6*  INR  --   < > 4.40*  --  2.96* 2.49*  CREATININE 1.48*  --   --  1.35*  --  1.50*  < > = values in this interval not displayed.  Estimated Creatinine Clearance: 71.9 mL/min (by C-G formula based on Cr of 1.5).  Assessment: 60 year old male admitted with diabetic foot ulcer on Coumadin PTA for Afib.  Admit INR = 3.77 with PTA dose of 4.5 mg po daily.  CBC from 6/24 stable and no immediate surgical plans noted.  INR peaked at 4.4, currently 2.49.   Remains on Ceftriaxone and Flagyl for treatment of cellulitis of R great toe and was on fluconazole PTA. Noted he will likely be discharged soon on PO antibiotics.  Goal of Therapy:  INR 2-3 Monitor platelets by anticoagulation protocol: Yes   Plan:  -warfarin 2mg  po x1 tonight -daily INR while here -if patient is discharged on PO Flagyl, recommend warfarin 2mg  po daily with an INR check on Thursday 6/29. Please call pharmacy with any other questions regarding interactions with discharge antibiotics.  Lisl Slingerland D. Charly Holcomb, PharmD, BCPS Clinical Pharmacist Pager: [phone] 02/21/2016 8:59 AM
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
## 2016-02-21 NOTE — Care Management Note (Signed)
Case Management Note  Patient Details  Name: Brian Martin MRN: [id number] Date of Birth: 1956-08-26  Subjective/Objective:                 Patient admitted with cellulitis to R toe. Will DC on PO Abx. Lives at home with his wife, is followed by Dr Sharol Given.   Action/Plan:  No CM needs at this time.   Expected Discharge Date:                  Expected Discharge Plan:  Home/Self Care  In-House Referral:     Discharge planning Services  CM Consult  Post Acute Care Choice:  NA Choice offered to:     DME Arranged:    DME Agency:     HH Arranged:    [place] Agency:     Status of Service:  Completed, signed off  If discussed at [employer] of Stay Meetings, dates discussed:    Additional Comments:  Carles Collet, RN 02/21/2016, 12:16 PM

## 2016-02-21 NOTE — Progress Notes (Signed)
Patient ID: Brian Martin, male   DOB: December 24, 1955, 60 y.o.   MRN: [id number] Patient states that he feels a lot better this morning. He still has a little bit of redness over the dorsum of the MTP joint. I feel would be safe to discharge him on oral antibiotics as well as colchicine 0.6 mg a day I will follow-up in the office the end of this week.

## 2016-02-21 NOTE — Clinical Documentation Improvement (Signed)
Internal Medicine  The patient has an order for the following medication(s):  Colchicine 0.6mg  po BID  Please provide a corresponding diagnosis that supports the use of this medication. 02/20/2016 progress note: "Uric acid 6.8, I ordered colcrys 0.6 mg BID" 02/20/16  Note: ortho does not recommend any further intervention- Dr Sharol Given has started Colchicine- he will evaluate tomorrow 02/21/16 note: of redness over the dorsum of the MTP joint. I feel would be safe to discharge him on oral antibiotics as well as colchicine 0.6 mg a day  Supporting Information (risk factors, sign and symptoms, diagnostics, treatment): 02/19/16 consult: Assessment: Assessment: Redness swelling or pain right great toe MTP joint. Plan: Plan: I will order a uric acid level to see if gout is a component of his symptoms  02/19/13 progress note: Skin: Erythema and swelling of right foot in the area of the first metatarsal head and the first toe    Please exercise your independent, professional judgment when responding. A specific answer is not anticipated or expected. Please update your documentation within the medical record to reflect your response to this query. Thank you  Thank You,  [place] [phone]

## 2016-02-23 LAB — CULTURE, BLOOD (ROUTINE X 2)
CULTURE: NO GROWTH
Culture: NO GROWTH

## 2016-03-10 ENCOUNTER — Ambulatory Visit (INDEPENDENT_AMBULATORY_CARE_PROVIDER_SITE_OTHER): Payer: Medicaid Other | Admitting: *Deleted

## 2016-03-10 DIAGNOSIS — I482 Chronic atrial fibrillation, unspecified: Secondary | ICD-10-CM

## 2016-03-10 DIAGNOSIS — Z7901 Long term (current) use of anticoagulants: Secondary | ICD-10-CM | POA: Diagnosis not present

## 2016-03-10 DIAGNOSIS — I4891 Unspecified atrial fibrillation: Secondary | ICD-10-CM

## 2016-03-10 DIAGNOSIS — Z5181 Encounter for therapeutic drug level monitoring: Secondary | ICD-10-CM | POA: Diagnosis not present

## 2016-03-10 LAB — POCT INR: INR: 1.7

## 2016-03-21 ENCOUNTER — Encounter (INDEPENDENT_AMBULATORY_CARE_PROVIDER_SITE_OTHER): Payer: Self-pay

## 2016-03-21 ENCOUNTER — Ambulatory Visit (INDEPENDENT_AMBULATORY_CARE_PROVIDER_SITE_OTHER): Payer: Medicaid Other | Admitting: *Deleted

## 2016-03-21 DIAGNOSIS — Z5181 Encounter for therapeutic drug level monitoring: Secondary | ICD-10-CM | POA: Diagnosis not present

## 2016-03-21 DIAGNOSIS — Z7901 Long term (current) use of anticoagulants: Secondary | ICD-10-CM

## 2016-03-21 DIAGNOSIS — I4891 Unspecified atrial fibrillation: Secondary | ICD-10-CM | POA: Diagnosis not present

## 2016-03-21 LAB — POCT INR: INR: 2.8

## 2016-04-14 ENCOUNTER — Ambulatory Visit (INDEPENDENT_AMBULATORY_CARE_PROVIDER_SITE_OTHER): Payer: Medicaid Other | Admitting: Pharmacist

## 2016-04-14 DIAGNOSIS — Z7901 Long term (current) use of anticoagulants: Secondary | ICD-10-CM

## 2016-04-14 DIAGNOSIS — I4891 Unspecified atrial fibrillation: Secondary | ICD-10-CM | POA: Diagnosis not present

## 2016-04-14 DIAGNOSIS — Z5181 Encounter for therapeutic drug level monitoring: Secondary | ICD-10-CM

## 2016-04-14 LAB — POCT INR: INR: 2.4

## 2016-05-11 ENCOUNTER — Other Ambulatory Visit (HOSPITAL_COMMUNITY): Payer: Self-pay | Admitting: Internal Medicine

## 2016-05-12 ENCOUNTER — Ambulatory Visit (INDEPENDENT_AMBULATORY_CARE_PROVIDER_SITE_OTHER): Payer: Medicaid Other | Admitting: Pharmacist

## 2016-05-12 DIAGNOSIS — I4891 Unspecified atrial fibrillation: Secondary | ICD-10-CM | POA: Diagnosis not present

## 2016-05-12 DIAGNOSIS — Z7901 Long term (current) use of anticoagulants: Secondary | ICD-10-CM

## 2016-05-12 DIAGNOSIS — Z5181 Encounter for therapeutic drug level monitoring: Secondary | ICD-10-CM | POA: Diagnosis not present

## 2016-05-12 LAB — POCT INR: INR: 2.7

## 2016-06-30 ENCOUNTER — Ambulatory Visit (INDEPENDENT_AMBULATORY_CARE_PROVIDER_SITE_OTHER): Payer: Medicaid Other | Admitting: *Deleted

## 2016-06-30 DIAGNOSIS — I4891 Unspecified atrial fibrillation: Secondary | ICD-10-CM | POA: Diagnosis not present

## 2016-06-30 DIAGNOSIS — Z7901 Long term (current) use of anticoagulants: Secondary | ICD-10-CM

## 2016-06-30 DIAGNOSIS — Z5181 Encounter for therapeutic drug level monitoring: Secondary | ICD-10-CM

## 2016-06-30 LAB — POCT INR: INR: 3

## 2016-07-31 ENCOUNTER — Other Ambulatory Visit: Payer: Self-pay | Admitting: *Deleted

## 2016-07-31 MED ORDER — WARFARIN SODIUM 3 MG PO TABS: 3.0000 mg | ORAL_TABLET | ORAL | 3 refills | Status: DC

## 2016-08-11 ENCOUNTER — Ambulatory Visit (INDEPENDENT_AMBULATORY_CARE_PROVIDER_SITE_OTHER): Payer: Medicaid Other | Admitting: *Deleted

## 2016-08-11 DIAGNOSIS — Z7901 Long term (current) use of anticoagulants: Secondary | ICD-10-CM

## 2016-08-11 DIAGNOSIS — Z5181 Encounter for therapeutic drug level monitoring: Secondary | ICD-10-CM

## 2016-08-11 DIAGNOSIS — I4891 Unspecified atrial fibrillation: Secondary | ICD-10-CM

## 2016-08-11 LAB — POCT INR: INR: 2.8

## 2016-09-22 ENCOUNTER — Ambulatory Visit (INDEPENDENT_AMBULATORY_CARE_PROVIDER_SITE_OTHER): Payer: Medicaid Other | Admitting: *Deleted

## 2016-09-22 DIAGNOSIS — Z5181 Encounter for therapeutic drug level monitoring: Secondary | ICD-10-CM

## 2016-09-22 DIAGNOSIS — Z7901 Long term (current) use of anticoagulants: Secondary | ICD-10-CM | POA: Diagnosis not present

## 2016-09-22 DIAGNOSIS — I4891 Unspecified atrial fibrillation: Secondary | ICD-10-CM

## 2016-09-22 LAB — POCT INR: INR: 3.7

## 2016-09-27 ENCOUNTER — Other Ambulatory Visit (HOSPITAL_COMMUNITY): Payer: Self-pay | Admitting: Internal Medicine

## 2016-10-20 ENCOUNTER — Ambulatory Visit (INDEPENDENT_AMBULATORY_CARE_PROVIDER_SITE_OTHER): Payer: Medicaid Other | Admitting: Pharmacist

## 2016-10-20 DIAGNOSIS — I4891 Unspecified atrial fibrillation: Secondary | ICD-10-CM | POA: Diagnosis not present

## 2016-10-20 DIAGNOSIS — Z5181 Encounter for therapeutic drug level monitoring: Secondary | ICD-10-CM | POA: Diagnosis not present

## 2016-10-20 DIAGNOSIS — Z7901 Long term (current) use of anticoagulants: Secondary | ICD-10-CM

## 2016-10-20 LAB — POCT INR: INR: 3.3

## 2016-11-17 ENCOUNTER — Ambulatory Visit (INDEPENDENT_AMBULATORY_CARE_PROVIDER_SITE_OTHER): Payer: Medicaid Other | Admitting: *Deleted

## 2016-11-17 DIAGNOSIS — Z7901 Long term (current) use of anticoagulants: Secondary | ICD-10-CM

## 2016-11-17 DIAGNOSIS — Z5181 Encounter for therapeutic drug level monitoring: Secondary | ICD-10-CM | POA: Diagnosis not present

## 2016-11-17 DIAGNOSIS — I4891 Unspecified atrial fibrillation: Secondary | ICD-10-CM

## 2016-11-17 LAB — POCT INR: INR: 3.6

## 2016-12-08 ENCOUNTER — Ambulatory Visit (INDEPENDENT_AMBULATORY_CARE_PROVIDER_SITE_OTHER): Payer: Medicaid Other | Admitting: Pharmacist

## 2016-12-08 DIAGNOSIS — Z5181 Encounter for therapeutic drug level monitoring: Secondary | ICD-10-CM

## 2016-12-08 DIAGNOSIS — Z7901 Long term (current) use of anticoagulants: Secondary | ICD-10-CM

## 2016-12-08 DIAGNOSIS — I4891 Unspecified atrial fibrillation: Secondary | ICD-10-CM

## 2016-12-08 LAB — POCT INR: INR: 2.3

## 2016-12-12 ENCOUNTER — Other Ambulatory Visit: Payer: Self-pay | Admitting: Internal Medicine

## 2017-01-05 ENCOUNTER — Ambulatory Visit (INDEPENDENT_AMBULATORY_CARE_PROVIDER_SITE_OTHER): Payer: Medicaid Other | Admitting: Pharmacist

## 2017-01-05 DIAGNOSIS — Z7901 Long term (current) use of anticoagulants: Secondary | ICD-10-CM | POA: Diagnosis not present

## 2017-01-05 DIAGNOSIS — Z5181 Encounter for therapeutic drug level monitoring: Secondary | ICD-10-CM

## 2017-01-05 DIAGNOSIS — I4891 Unspecified atrial fibrillation: Secondary | ICD-10-CM

## 2017-01-05 LAB — POCT INR: INR: 2.8

## 2017-01-12 ENCOUNTER — Telehealth (INDEPENDENT_AMBULATORY_CARE_PROVIDER_SITE_OTHER): Payer: Self-pay | Admitting: Orthopedic Surgery

## 2017-01-12 NOTE — Telephone Encounter (Signed)
Patient's wife called today stating that Dr. Sharol Given amputated his leg 6 years ago.  They are needing a statement for tax purposes stating that he has been disabled from 6 years ago to now.  DH#[phone].

## 2017-01-17 NOTE — Telephone Encounter (Signed)
Letter transcribed. I will call once I'm at that office so I can print off and log at the front desk.

## 2017-01-17 NOTE — Telephone Encounter (Signed)
Disregard previous message. Nothing was faxed to [employer], letter was mailed directly to patients home address. Tried calling patient with no answer.

## 2017-01-17 NOTE — Telephone Encounter (Signed)
Faxed this afternoon to [employer].

## 2017-01-26 ENCOUNTER — Other Ambulatory Visit (HOSPITAL_COMMUNITY): Payer: Self-pay | Admitting: Internal Medicine

## 2017-01-30 ENCOUNTER — Other Ambulatory Visit (HOSPITAL_COMMUNITY): Payer: Self-pay | Admitting: Internal Medicine

## 2017-02-16 ENCOUNTER — Encounter (INDEPENDENT_AMBULATORY_CARE_PROVIDER_SITE_OTHER): Payer: Self-pay

## 2017-02-16 ENCOUNTER — Ambulatory Visit (INDEPENDENT_AMBULATORY_CARE_PROVIDER_SITE_OTHER): Payer: Medicaid Other | Admitting: Pharmacist

## 2017-02-16 DIAGNOSIS — Z7901 Long term (current) use of anticoagulants: Secondary | ICD-10-CM | POA: Diagnosis not present

## 2017-02-16 DIAGNOSIS — I4891 Unspecified atrial fibrillation: Secondary | ICD-10-CM | POA: Diagnosis not present

## 2017-02-16 DIAGNOSIS — Z5181 Encounter for therapeutic drug level monitoring: Secondary | ICD-10-CM | POA: Diagnosis not present

## 2017-02-16 LAB — POCT INR: INR: 1.6

## 2017-03-16 ENCOUNTER — Ambulatory Visit (INDEPENDENT_AMBULATORY_CARE_PROVIDER_SITE_OTHER): Payer: Medicaid Other | Admitting: Pharmacist

## 2017-03-16 DIAGNOSIS — Z7901 Long term (current) use of anticoagulants: Secondary | ICD-10-CM

## 2017-03-16 DIAGNOSIS — I4891 Unspecified atrial fibrillation: Secondary | ICD-10-CM

## 2017-03-16 DIAGNOSIS — Z5181 Encounter for therapeutic drug level monitoring: Secondary | ICD-10-CM | POA: Diagnosis not present

## 2017-03-16 LAB — POCT INR: INR: 2

## 2017-04-13 ENCOUNTER — Ambulatory Visit (INDEPENDENT_AMBULATORY_CARE_PROVIDER_SITE_OTHER): Payer: Medicaid Other | Admitting: *Deleted

## 2017-04-13 DIAGNOSIS — I4891 Unspecified atrial fibrillation: Secondary | ICD-10-CM | POA: Diagnosis not present

## 2017-04-13 DIAGNOSIS — Z7901 Long term (current) use of anticoagulants: Secondary | ICD-10-CM | POA: Diagnosis not present

## 2017-04-13 DIAGNOSIS — Z5181 Encounter for therapeutic drug level monitoring: Secondary | ICD-10-CM

## 2017-04-13 LAB — POCT INR: INR: 1.9

## 2017-05-07 ENCOUNTER — Other Ambulatory Visit: Payer: Self-pay | Admitting: Internal Medicine

## 2017-05-14 ENCOUNTER — Ambulatory Visit (INDEPENDENT_AMBULATORY_CARE_PROVIDER_SITE_OTHER): Payer: Medicaid Other | Admitting: Pharmacist Clinician (PhC)/ Clinical Pharmacy Specialist

## 2017-05-14 DIAGNOSIS — Z5181 Encounter for therapeutic drug level monitoring: Secondary | ICD-10-CM | POA: Diagnosis not present

## 2017-05-14 DIAGNOSIS — I4891 Unspecified atrial fibrillation: Secondary | ICD-10-CM | POA: Diagnosis not present

## 2017-05-14 LAB — POCT INR: INR: 1.7

## 2017-06-01 ENCOUNTER — Ambulatory Visit (INDEPENDENT_AMBULATORY_CARE_PROVIDER_SITE_OTHER): Payer: Medicaid Other | Admitting: *Deleted

## 2017-06-01 DIAGNOSIS — I4891 Unspecified atrial fibrillation: Secondary | ICD-10-CM

## 2017-06-01 DIAGNOSIS — Z5181 Encounter for therapeutic drug level monitoring: Secondary | ICD-10-CM | POA: Diagnosis not present

## 2017-06-01 LAB — POCT INR: INR: 2.6

## 2017-07-06 ENCOUNTER — Ambulatory Visit (INDEPENDENT_AMBULATORY_CARE_PROVIDER_SITE_OTHER): Payer: Medicaid Other | Admitting: *Deleted

## 2017-07-06 DIAGNOSIS — I4891 Unspecified atrial fibrillation: Secondary | ICD-10-CM

## 2017-07-06 DIAGNOSIS — Z5181 Encounter for therapeutic drug level monitoring: Secondary | ICD-10-CM

## 2017-07-06 LAB — POCT INR: INR: 3.1

## 2017-08-02 ENCOUNTER — Ambulatory Visit (INDEPENDENT_AMBULATORY_CARE_PROVIDER_SITE_OTHER): Payer: Medicaid Other

## 2017-08-02 DIAGNOSIS — Z5181 Encounter for therapeutic drug level monitoring: Secondary | ICD-10-CM | POA: Diagnosis not present

## 2017-08-02 DIAGNOSIS — I4891 Unspecified atrial fibrillation: Secondary | ICD-10-CM

## 2017-08-02 LAB — POCT INR: INR: 2.8

## 2017-08-02 NOTE — Patient Instructions (Signed)
Continue on same dosage 1.5 tablets daily. Recheck INR in 4 weeks.

## 2017-08-31 ENCOUNTER — Ambulatory Visit (INDEPENDENT_AMBULATORY_CARE_PROVIDER_SITE_OTHER): Payer: Self-pay | Admitting: *Deleted

## 2017-08-31 DIAGNOSIS — I4891 Unspecified atrial fibrillation: Secondary | ICD-10-CM

## 2017-08-31 DIAGNOSIS — Z5181 Encounter for therapeutic drug level monitoring: Secondary | ICD-10-CM

## 2017-08-31 LAB — POCT INR: INR: 2

## 2017-08-31 NOTE — Patient Instructions (Signed)
Description   Continue on same dosage 1.5 tablets daily. Recheck INR in 5 weeks.

## 2017-09-22 ENCOUNTER — Other Ambulatory Visit: Payer: Self-pay | Admitting: Internal Medicine

## 2017-10-05 ENCOUNTER — Ambulatory Visit (INDEPENDENT_AMBULATORY_CARE_PROVIDER_SITE_OTHER): Payer: Self-pay | Admitting: Pharmacist

## 2017-10-05 DIAGNOSIS — I4891 Unspecified atrial fibrillation: Secondary | ICD-10-CM

## 2017-10-05 DIAGNOSIS — Z5181 Encounter for therapeutic drug level monitoring: Secondary | ICD-10-CM

## 2017-10-05 LAB — POCT INR: INR: 1.6

## 2017-10-05 NOTE — Patient Instructions (Signed)
Description   Take 1 tablet when you get home, take 2 tablets total tomorrow, then continue on same dosage 1.5 tablets daily. Recheck INR in 5 weeks per patient request. He is aware of the risk of coming back later than recommended.

## 2017-11-09 ENCOUNTER — Ambulatory Visit (INDEPENDENT_AMBULATORY_CARE_PROVIDER_SITE_OTHER): Payer: BLUE CROSS/BLUE SHIELD | Admitting: *Deleted

## 2017-11-09 DIAGNOSIS — I4891 Unspecified atrial fibrillation: Secondary | ICD-10-CM | POA: Diagnosis not present

## 2017-11-09 DIAGNOSIS — Z5181 Encounter for therapeutic drug level monitoring: Secondary | ICD-10-CM | POA: Diagnosis not present

## 2017-11-09 LAB — POCT INR: INR: 2.7

## 2017-11-09 NOTE — Patient Instructions (Addendum)
Description   Continue taking 1.5 tablets daily.  Recheck INR in 6 weeks.  Main#[phone]    Please schedule an appt with Cardiologist.

## 2017-11-20 ENCOUNTER — Telehealth (HOSPITAL_COMMUNITY): Payer: Self-pay

## 2017-11-20 NOTE — Telephone Encounter (Signed)
CHF Clinic appointment reminder call placed to patient.  Does understand purpose of this appointment and where CHF Clinic is located? Yes  How is patient feeling? Well, no complaints  Does patient have all of their medications? Yes, from what he understands that he is supposed to be taking at this time  Patient also reminded to take all medications as prescribed on the day of his/her appointment and to bring all medications to this appointment. Advised to call our office for tardiness or cancellations/rescheduling needs.     (For clinic use)   ___________ Completed appointment   ___________ Evelina Bucy medications as prescribed before coming to appointment   ___________ Brought medications and/or medication list (whichever they go by) to their appointment    Vilinda Blanks, [place], BSN, CHFN

## 2017-11-21 ENCOUNTER — Ambulatory Visit (HOSPITAL_COMMUNITY)
Admission: RE | Admit: 2017-11-21 | Discharge: 2017-11-21 | Disposition: A | Discharge status: Home / Self Care | Payer: BLUE CROSS/BLUE SHIELD | Source: Ambulatory Visit | Attending: Cardiology | Admitting: Cardiology

## 2017-11-21 VITALS — BP 138/82 | HR 82 | Wt 231.0 lb

## 2017-11-21 DIAGNOSIS — I5032 Chronic diastolic (congestive) heart failure: Secondary | ICD-10-CM

## 2017-11-21 DIAGNOSIS — I480 Paroxysmal atrial fibrillation: Secondary | ICD-10-CM | POA: Diagnosis not present

## 2017-11-21 DIAGNOSIS — Z79899 Other long term (current) drug therapy: Secondary | ICD-10-CM | POA: Diagnosis not present

## 2017-11-21 DIAGNOSIS — Z794 Long term (current) use of insulin: Secondary | ICD-10-CM | POA: Insufficient documentation

## 2017-11-21 DIAGNOSIS — I130 Hypertensive heart and chronic kidney disease with heart failure and stage 1 through stage 4 chronic kidney disease, or unspecified chronic kidney disease: Secondary | ICD-10-CM | POA: Insufficient documentation

## 2017-11-21 DIAGNOSIS — Z7901 Long term (current) use of anticoagulants: Secondary | ICD-10-CM | POA: Insufficient documentation

## 2017-11-21 DIAGNOSIS — N183 Chronic kidney disease, stage 3 (moderate): Secondary | ICD-10-CM | POA: Insufficient documentation

## 2017-11-21 DIAGNOSIS — I429 Cardiomyopathy, unspecified: Secondary | ICD-10-CM | POA: Insufficient documentation

## 2017-11-21 DIAGNOSIS — I420 Dilated cardiomyopathy: Secondary | ICD-10-CM | POA: Insufficient documentation

## 2017-11-21 DIAGNOSIS — E1122 Type 2 diabetes mellitus with diabetic chronic kidney disease: Secondary | ICD-10-CM | POA: Diagnosis not present

## 2017-11-21 DIAGNOSIS — Z89619 Acquired absence of unspecified leg above knee: Secondary | ICD-10-CM | POA: Diagnosis not present

## 2017-11-21 LAB — BASIC METABOLIC PANEL
Anion gap: 8 (ref 5–15)
BUN: 22 mg/dL — AB (ref 6–20)
CHLORIDE: 106 mmol/L (ref 101–111)
CO2: 22 mmol/L (ref 22–32)
Calcium: 8.9 mg/dL (ref 8.9–10.3)
Creatinine, Ser: 1.25 mg/dL — ABNORMAL HIGH (ref 0.61–1.24)
GFR calc Af Amer: >60 mL/min (ref >60)
GLUCOSE: 115 mg/dL — AB (ref 65–99)
POTASSIUM: 4.8 mmol/L (ref 3.5–5.1)
Sodium: 136 mmol/L (ref 135–145)

## 2017-11-21 NOTE — Patient Instructions (Addendum)
Labs today (will call for abnormal results, otherwise no news is good news)  STOP taking Imdur  Echocardiogram and Follow up in 1 year, we will call you to schedule appointment.

## 2017-11-21 NOTE — Progress Notes (Signed)
Patient ID: Brian Martin, male   DOB: [birth year], 62 y.o.   MRN: [id number]  PCP: Dr Maryella Shivers Nephrologist: [facility] HF: Dr Haroldine Laws   HPI: Brian Martin is a 62 year-old male admitted in 06/2010 with a left foot ulcer complicated by osteomyelitis.  He subsequently underwent left below-the-knee amputation, now with LLE prosthesis (followed by Dr. Sharol Given).  Hospitalization c/b atrial fib and acute systolic HF. ECHO showed a dilated cardiomyopathy with an EF of 25-30% and moderate MR.   TEE no vegetation with an EF of 35%.  He developed acute renal failure secondary to acute tubular necrosis in the setting of sepsis with a Cr of 9.0.     09/12/10: Nuclear stress showed EF 43%.  Mildly decreased uptake in the inferior wall and infero-apex. Suspect soft tissue attenuation but cannot exclude previous infarct. No ischemia.     Repeat echo 12/2010 showed normalized EF. Although cardiomyopathy may be ischemic, with low risk myoviow cath was not pursued with given renal dysfx and absence of anginal sx.  Cr. 2.03 in May 2012.  His amiodarone was stopped in 12/2010 because he was maintaining sinus rhythm, he was continued on coumadin.    Today he returns for HF follow up. Overall feeling fine. Denies SOB/PND/Orthopnea. No chest pain. No BRBPR.  Appetite ok. No fever or chills. Weight has gone  40 pounds since the last year.  Taking all medications.   08/13/12 ECHO EF 45-50%  2016 ECHO EF 55%.   ROS: All systems negative except as listed in HPI, PMH and Problem List.  Past Medical History:  Diagnosis Date  . Amputation of leg ([place])   . Atrial fibrillation ([place])   . CHF (congestive heart failure) (HCC)    chronic systolic CHF  . Chronic renal failure   . Diabetes mellitus    type 2  . Hypertension     Current Outpatient Medications  Medication Sig Dispense Refill  . albuterol (PROVENTIL) (2.5 MG/3ML) 0.083% nebulizer solution Take 2.5 mg by nebulization every 6 (six) hours as needed for  wheezing or shortness of breath.    . Ascorbic Acid (VITAMIN C) 100 MG tablet Take 100 mg by mouth daily.    . budesonide-formoterol (SYMBICORT) 160-4.5 MCG/ACT inhaler Inhale 2 puffs into the lungs 2 (two) times daily.    . carvedilol (COREG) 25 MG tablet Take 25 mg by mouth 2 (two) times daily with a meal.    . docusate sodium (COLACE) 100 MG capsule Take 1 capsule (100 mg total) by mouth 2 (two) times daily. 10 capsule 0  . Exenatide (BYDUREON [place]) Inject 2 mg into the skin once a week.    . ferrous sulfate 325 (65 FE) MG tablet Take 1 tablet (325 mg total) by mouth daily with breakfast. 30 tablet 5  . furosemide (LASIX) 20 MG tablet Take 1 tablet (20 mg total) by mouth daily. 30 tablet 6  . insulin glargine (LANTUS) 100 UNIT/ML injection Inject 0.2 mLs (20 Units total) into the skin at bedtime. 10 mL 11  . isosorbide mononitrate (IMDUR) 60 MG 24 hr tablet Take 60 mg by mouth daily.    Marland Kitchen lisinopril (PRINIVIL,ZESTRIL) 10 MG tablet Take 10 mg by mouth daily.    . pantoprazole (PROTONIX) 40 MG tablet Take 1 tablet (40 mg total) by mouth daily. 30 tablet 5  . sertraline (ZOLOFT) 25 MG tablet Take 25 mg by mouth daily.    . simvastatin (ZOCOR) 10 MG tablet Take 10 mg by  mouth daily.    Marland Kitchen warfarin (COUMADIN) 3 MG tablet TAKE AS DIRECTED BY  COUMADIN  CLINIC 150 tablet 0   No current facility-administered medications for this encounter.     Vitals:   11/21/17 0927  BP: 138/82  Pulse: 82  SpO2: 98%  Weight: 231 lb (104.8 kg)    PHYSICAL EXAM: General:  Well appearing. No resp difficulty HEENT: normal Neck: supple. no JVD. Carotids 2+ bilat; no bruits. No lymphadenopathy or thryomegaly appreciated. Cor: PMI nondisplaced. Regular rate & rhythm. No rubs, gallops or murmurs. Lungs: clear Abdomen: soft, nontender, nondistended. No hepatosplenomegaly. No bruits or masses. Good bowel sounds. Extremities: no cyanosis, clubbing, rash, LLE prostheticedema Neuro: alert & orientedx3, cranial nerves  grossly intact. moves all 4 extremities w/o difficulty. Affect pleasant  EKG: SR  PAC 84 bpm   Assessment/Plan: 1. Nonischemic cardiomyopathy:  EF recovered in 2016, Plan to repeat ECHO.. If EF remains stable we will continue current medications. IF EF back down will stop lisinopril and start entresro.  - Will continue lasix 20 mg daily.   - Continue current dose coreg and lisinopril.  - PCP took him off hydralazine/imdur.  and Imdur.  -  2. PAF :  Maintaining NSR.  On coumadin. INR followed at that coumadin clinic.  3. CKD stage III :   Check BMET   Follow up in 1 year with Dr Haroldine Laws.   Amy Clegg NP-C  11/21/2017

## 2017-11-30 ENCOUNTER — Other Ambulatory Visit: Payer: Self-pay | Admitting: Internal Medicine

## 2017-12-21 ENCOUNTER — Ambulatory Visit (INDEPENDENT_AMBULATORY_CARE_PROVIDER_SITE_OTHER): Payer: BLUE CROSS/BLUE SHIELD | Admitting: *Deleted

## 2017-12-21 DIAGNOSIS — Z5181 Encounter for therapeutic drug level monitoring: Secondary | ICD-10-CM

## 2017-12-21 DIAGNOSIS — I4891 Unspecified atrial fibrillation: Secondary | ICD-10-CM

## 2017-12-21 LAB — POCT INR: INR: 2.6

## 2017-12-21 NOTE — Patient Instructions (Signed)
Description   Continue taking 1.5 tablets daily.  Recheck INR in 6 weeks.  Main#[phone]       

## 2018-02-01 ENCOUNTER — Ambulatory Visit (INDEPENDENT_AMBULATORY_CARE_PROVIDER_SITE_OTHER): Payer: BLUE CROSS/BLUE SHIELD | Admitting: Pharmacist

## 2018-02-01 DIAGNOSIS — Z5181 Encounter for therapeutic drug level monitoring: Secondary | ICD-10-CM | POA: Diagnosis not present

## 2018-02-01 DIAGNOSIS — I4891 Unspecified atrial fibrillation: Secondary | ICD-10-CM

## 2018-02-01 LAB — POCT INR: INR: 2.7 (ref 2.0–3.0)

## 2018-02-01 NOTE — Patient Instructions (Signed)
Description   Continue taking 1.5 tablets daily.  Recheck INR in 6 weeks.  Main#[phone]       

## 2018-02-26 ENCOUNTER — Other Ambulatory Visit: Payer: Self-pay | Admitting: Internal Medicine

## 2018-03-15 ENCOUNTER — Ambulatory Visit (INDEPENDENT_AMBULATORY_CARE_PROVIDER_SITE_OTHER): Payer: BLUE CROSS/BLUE SHIELD

## 2018-03-15 DIAGNOSIS — Z5181 Encounter for therapeutic drug level monitoring: Secondary | ICD-10-CM | POA: Diagnosis not present

## 2018-03-15 DIAGNOSIS — I4891 Unspecified atrial fibrillation: Secondary | ICD-10-CM

## 2018-03-15 LAB — POCT INR: INR: 2.9 (ref 2.0–3.0)

## 2018-03-15 NOTE — Patient Instructions (Signed)
Description   Continue taking 1.5 tablets daily.  Recheck INR in 6 weeks.  Main#[phone]

## 2018-04-26 ENCOUNTER — Ambulatory Visit (INDEPENDENT_AMBULATORY_CARE_PROVIDER_SITE_OTHER): Payer: BLUE CROSS/BLUE SHIELD

## 2018-04-26 DIAGNOSIS — I4891 Unspecified atrial fibrillation: Secondary | ICD-10-CM

## 2018-04-26 DIAGNOSIS — Z5181 Encounter for therapeutic drug level monitoring: Secondary | ICD-10-CM | POA: Diagnosis not present

## 2018-04-26 LAB — POCT INR: INR: 3.4 — AB (ref 2.0–3.0)

## 2018-04-26 NOTE — Patient Instructions (Addendum)
Description   Skip tomorrow's dosage of Coumadin, then resume same dosage 1.5 tablets daily.  Recheck INR in 5 weeks.  Main#[phone]

## 2018-05-31 ENCOUNTER — Ambulatory Visit (INDEPENDENT_AMBULATORY_CARE_PROVIDER_SITE_OTHER): Payer: BLUE CROSS/BLUE SHIELD

## 2018-05-31 DIAGNOSIS — Z5181 Encounter for therapeutic drug level monitoring: Secondary | ICD-10-CM

## 2018-05-31 DIAGNOSIS — I4891 Unspecified atrial fibrillation: Secondary | ICD-10-CM | POA: Diagnosis not present

## 2018-05-31 LAB — POCT INR: INR: 1.6 — AB (ref 2.0–3.0)

## 2018-05-31 NOTE — Patient Instructions (Signed)
Description   Take 2 tablets tomorrow, then resume same dosage 1.5 tablets daily.  Recheck INR in 3-4 weeks.  [facility] Clinic [phone] Main#[phone]

## 2018-06-27 ENCOUNTER — Other Ambulatory Visit: Payer: Self-pay | Admitting: Internal Medicine

## 2018-07-05 ENCOUNTER — Ambulatory Visit (INDEPENDENT_AMBULATORY_CARE_PROVIDER_SITE_OTHER): Payer: BLUE CROSS/BLUE SHIELD | Admitting: *Deleted

## 2018-07-05 DIAGNOSIS — I4891 Unspecified atrial fibrillation: Secondary | ICD-10-CM | POA: Diagnosis not present

## 2018-07-05 DIAGNOSIS — Z5181 Encounter for therapeutic drug level monitoring: Secondary | ICD-10-CM | POA: Diagnosis not present

## 2018-07-05 LAB — POCT INR: INR: 3 (ref 2.0–3.0)

## 2018-07-05 NOTE — Patient Instructions (Signed)
Description   Continue taking 1.5 tablets daily.  Recheck INR in 3 weeks. Pt refuses to come in sooner than 6 weeks, aware of risks.  [facility] [phone] Main#[phone]

## 2018-08-16 ENCOUNTER — Ambulatory Visit (INDEPENDENT_AMBULATORY_CARE_PROVIDER_SITE_OTHER): Payer: BLUE CROSS/BLUE SHIELD

## 2018-08-16 DIAGNOSIS — Z5181 Encounter for therapeutic drug level monitoring: Secondary | ICD-10-CM | POA: Diagnosis not present

## 2018-08-16 DIAGNOSIS — I4891 Unspecified atrial fibrillation: Secondary | ICD-10-CM | POA: Diagnosis not present

## 2018-08-16 LAB — POCT INR: INR: 2.8 (ref 2.0–3.0)

## 2018-08-16 NOTE — Patient Instructions (Signed)
Description   Continue taking 1.5 tablets daily.  Recheck INR in 6 weeks. Pt refuses to come in sooner than 6 weeks, aware of risks.  [facility] [phone] Main#[phone]

## 2018-09-17 ENCOUNTER — Other Ambulatory Visit: Payer: Self-pay | Admitting: Internal Medicine

## 2018-09-27 ENCOUNTER — Ambulatory Visit (INDEPENDENT_AMBULATORY_CARE_PROVIDER_SITE_OTHER): Payer: BLUE CROSS/BLUE SHIELD

## 2018-09-27 DIAGNOSIS — Z5181 Encounter for therapeutic drug level monitoring: Secondary | ICD-10-CM

## 2018-09-27 DIAGNOSIS — I4891 Unspecified atrial fibrillation: Secondary | ICD-10-CM | POA: Diagnosis not present

## 2018-09-27 LAB — POCT INR: INR: 2.3 (ref 2.0–3.0)

## 2018-09-27 NOTE — Patient Instructions (Signed)
Description   Continue taking 1.5 tablets daily.  Recheck INR in 6 weeks. Pt refuses to come in sooner than 6 weeks, aware of risks.  [facility] [phone] Main#[phone]

## 2018-11-18 ENCOUNTER — Telehealth: Payer: Self-pay | Admitting: Pharmacist

## 2018-11-18 NOTE — Telephone Encounter (Signed)

## 2018-11-21 ENCOUNTER — Ambulatory Visit (INDEPENDENT_AMBULATORY_CARE_PROVIDER_SITE_OTHER): Payer: BLUE CROSS/BLUE SHIELD | Admitting: Pharmacist

## 2018-11-21 ENCOUNTER — Other Ambulatory Visit: Payer: Self-pay

## 2018-11-21 DIAGNOSIS — Z5181 Encounter for therapeutic drug level monitoring: Secondary | ICD-10-CM

## 2018-11-21 DIAGNOSIS — I4891 Unspecified atrial fibrillation: Secondary | ICD-10-CM | POA: Diagnosis not present

## 2018-11-21 LAB — POCT INR: INR: 3.1 — AB (ref 2.0–3.0)

## 2018-12-02 ENCOUNTER — Other Ambulatory Visit: Payer: Self-pay

## 2018-12-02 ENCOUNTER — Ambulatory Visit (HOSPITAL_COMMUNITY)
Admission: RE | Admit: 2018-12-02 | Discharge: 2018-12-02 | Disposition: A | Discharge status: Home / Self Care | Payer: BLUE CROSS/BLUE SHIELD | Source: Ambulatory Visit | Attending: Internal Medicine | Admitting: Internal Medicine

## 2018-12-02 VITALS — Wt 225.0 lb

## 2018-12-02 DIAGNOSIS — I5032 Chronic diastolic (congestive) heart failure: Secondary | ICD-10-CM

## 2018-12-02 NOTE — Patient Instructions (Addendum)
NO MEDICATION CHANGES TODAY!!  Your physician recommends that you schedule a follow-up appointment in: [place]. YOU WILL GET A PHONE CALL TO SCHEDULE THIS APPOINTMENT AT THE END OF THE YEAR. IF YOU DO NOT GET THIS CALL, PLEASE CALL us TO SCHEDULE THIS APPOINTMENT [phone].

## 2018-12-02 NOTE — Addendum Note (Signed)
Encounter addended by: Valeda Malm, RN on: 12/02/2018 1:53 PM  Actions taken: Clinical Note Signed

## 2018-12-02 NOTE — Progress Notes (Signed)
Heart Failure TeleHealth Note  Due to national recommendations of social distancing due to [place] 19, Audio/video telehealth visit is felt to be most appropriate for this patient at this time.  See MyChart message from today for patient consent regarding telehealth for [facility].  Date:  12/02/2018   ID:  [name], DOB 12-04-1955, MRN [id number]  Location: Home  Provider location: [address] [place] Type of Visit: Established   PCP:  Marco Collie, MD  Cardiologist:  No primary care provider on file. Primary HF: Dr Haroldine Laws  Chief Complaint: Heart Failure    History of Present Illness: Brian Martin is a 63 y.o. male who presents via audio conferencing for a telehealth visit today.    Brian Martin is a 63 year-old male h/o osteomyelitis, left below-the-knee amputation, atrial fib and systolic HF. In 2011 his EF was down to 25-30% but recovered to 55% in 2016.   Today, he denies symptoms of palpitations, chest pain, shortness of breath, orthopnea, PND, lower extremity edema, claudication, dizziness, presyncope, syncope, or bleeding. The patient is tolerating medications without difficulties and is otherwise without complaint today.    He denies symptoms of cough, fevers, chills, or new SOB worrisome for COVID 19.    Past Medical History:  Diagnosis Date  . Amputation of leg ([place])   . Atrial fibrillation ([place])   . CHF (congestive heart failure) (HCC)    chronic systolic CHF  . Chronic renal failure   . Diabetes mellitus    type 2  . Hypertension    Past Surgical History:  Procedure Laterality Date  . BELOW KNEE LEG AMPUTATION  06/29/2010   Left   . COLONOSCOPY N/A 06/15/2013   Procedure: COLONOSCOPY;  Surgeon: Lear Ng, MD;  Location: [facility] ENDOSCOPY;  Service: Endoscopy;  Laterality: N/A;  . ESOPHAGOGASTRODUODENOSCOPY N/A 06/14/2013   Procedure: ESOPHAGOGASTRODUODENOSCOPY (EGD);  Surgeon: Lear Ng, MD;  Location: [facility] ENDOSCOPY;   Service: Endoscopy;  Laterality: N/A;     Current Outpatient Medications  Medication Sig Dispense Refill  . albuterol (PROVENTIL) (2.5 MG/3ML) 0.083% nebulizer solution Take 2.5 mg by nebulization every 6 (six) hours as needed for wheezing or shortness of breath.    . Ascorbic Acid (VITAMIN C) 100 MG tablet Take 100 mg by mouth daily.    . budesonide-formoterol (SYMBICORT) 160-4.5 MCG/ACT inhaler Inhale 2 puffs into the lungs 2 (two) times daily.    . carvedilol (COREG) 25 MG tablet Take 25 mg by mouth 2 (two) times daily with a meal.    . docusate sodium (COLACE) 100 MG capsule Take 1 capsule (100 mg total) by mouth 2 (two) times daily. 10 capsule 0  . Exenatide (BYDUREON [place]) Inject 2 mg into the skin once a week.    . ferrous sulfate 325 (65 FE) MG tablet Take 1 tablet (325 mg total) by mouth daily with breakfast. 30 tablet 5  . furosemide (LASIX) 20 MG tablet Take 1 tablet (20 mg total) by mouth daily. 30 tablet 6  . insulin glargine (LANTUS) 100 UNIT/ML injection Inject 0.2 mLs (20 Units total) into the skin at bedtime. 10 mL 11  . lisinopril (PRINIVIL,ZESTRIL) 10 MG tablet Take 10 mg by mouth daily.    . pantoprazole (PROTONIX) 40 MG tablet Take 1 tablet (40 mg total) by mouth daily. 30 tablet 5  . sertraline (ZOLOFT) 25 MG tablet Take 25 mg by mouth daily.    . simvastatin (ZOCOR) 10 MG tablet Take  10 mg by mouth daily.    Marland Kitchen warfarin (COUMADIN) 3 MG tablet TAKE AS DIRECTED PER  COUMADIN  CLINIC 150 tablet 0   No current facility-administered medications for this encounter.     Allergies:   Patient has no known allergies.   Social History:  The patient  reports that he quit smoking about 17 years ago. He does not have any smokeless tobacco history on file. He reports current drug use. Drug: Marijuana. He reports that he does not drink alcohol.   Family History:  The patient's family history includes Diabetes in an other family member.   ROS:  Please see the history of present  illness.   All other systems are personally reviewed and negative.   Exam: Tele Health Call; Exam is subjective General:  Well appearing. No resp difficulty. Lungs: Normal respiratory effort with conversation.  Abdomen: Pt denies tenderness with self palpation.  Extremities: Pt denies edema. Neuro: Alert & oriented x 3.   Recent Labs: No results found for requested labs within last 8760 hours.  Personally reviewed   Wt Readings from Last 3 Encounters:  12/02/18 102.1 kg (225 lb)  11/21/17 104.8 kg (231 lb)  02/18/16 123 kg (271 lb 4 oz)      Other studies personally reviewed: Additional studies/ records that were reviewed today include: none    ASSESSMENT AND PLAN:  1. Chronic Diastolic Heart Failure.  In 2016 EF recovered to 55%. NYHA I.  Sounds like he is doing well. Continue current cardiac medications. No change.   COVID screen The patient does not have any symptoms that suggest any further testing/ screening at this time.  Social distancing reinforced today.  Recommended follow-up:  1 year with Dr Haroldine Laws  Relevant cardiac medications were reviewed at length with the patient today.   The patient does not have concerns regarding their medications at this time.    Patient Risk: After full review of this patients clinical status, I feel that they are at moderate risk for cardiac decompensation at this time.  Today, I have spent 15 minutes with the patient with telehealth technology discussing the above .    Brian Hubert, NP  12/02/2018 11:40 AM  [facility] [place] [phone] (office) [phone] (fax)

## 2018-12-02 NOTE — Progress Notes (Signed)
AVS MAILED TO PATIENT. F/U 1 YEAR

## 2019-01-14 ENCOUNTER — Ambulatory Visit (INDEPENDENT_AMBULATORY_CARE_PROVIDER_SITE_OTHER): Payer: BLUE CROSS/BLUE SHIELD | Admitting: Pharmacist

## 2019-01-14 ENCOUNTER — Other Ambulatory Visit: Payer: Self-pay

## 2019-01-14 DIAGNOSIS — I4891 Unspecified atrial fibrillation: Secondary | ICD-10-CM | POA: Diagnosis not present

## 2019-01-14 DIAGNOSIS — Z5181 Encounter for therapeutic drug level monitoring: Secondary | ICD-10-CM

## 2019-01-14 LAB — POCT INR: INR: 3.1 — AB (ref 2.0–3.0)

## 2019-01-29 ENCOUNTER — Other Ambulatory Visit: Payer: Self-pay | Admitting: Internal Medicine

## 2019-03-04 ENCOUNTER — Telehealth: Payer: Self-pay

## 2019-03-04 NOTE — Telephone Encounter (Signed)

## 2019-05-09 ENCOUNTER — Ambulatory Visit (INDEPENDENT_AMBULATORY_CARE_PROVIDER_SITE_OTHER): Payer: BLUE CROSS/BLUE SHIELD

## 2019-05-09 ENCOUNTER — Other Ambulatory Visit: Payer: Self-pay

## 2019-05-09 DIAGNOSIS — Z5181 Encounter for therapeutic drug level monitoring: Secondary | ICD-10-CM

## 2019-05-09 DIAGNOSIS — I4891 Unspecified atrial fibrillation: Secondary | ICD-10-CM

## 2019-05-09 LAB — POCT INR: INR: 3 (ref 2.0–3.0)

## 2019-05-09 NOTE — Patient Instructions (Signed)
Description   Continue taking 1.5 tablets daily.  Recheck INR in 8 weeks. Coumadin Clinic [phone] Main#[phone]-thinking about DOAC-concerned bc a family member had a bleed

## 2019-05-17 ENCOUNTER — Other Ambulatory Visit: Payer: Self-pay | Admitting: Internal Medicine

## 2019-05-20 ENCOUNTER — Other Ambulatory Visit (HOSPITAL_COMMUNITY): Payer: Self-pay

## 2019-07-04 ENCOUNTER — Ambulatory Visit (INDEPENDENT_AMBULATORY_CARE_PROVIDER_SITE_OTHER): Payer: BLUE CROSS/BLUE SHIELD

## 2019-07-04 ENCOUNTER — Other Ambulatory Visit: Payer: Self-pay

## 2019-07-04 DIAGNOSIS — I4891 Unspecified atrial fibrillation: Secondary | ICD-10-CM | POA: Diagnosis not present

## 2019-07-04 DIAGNOSIS — Z5181 Encounter for therapeutic drug level monitoring: Secondary | ICD-10-CM

## 2019-07-04 LAB — POCT INR: INR: 3.7 — AB (ref 2.0–3.0)

## 2019-07-04 NOTE — Patient Instructions (Signed)
Description   Skip tomorrow's dosage of Coumadin, then resume same dosage 1.5 tablets daily.  Recheck INR in 6 weeks. Coumadin Clinic [phone] Main#[phone]-thinking about DOAC-concerned bc a family member had a bleed

## 2019-08-15 ENCOUNTER — Other Ambulatory Visit: Payer: Self-pay

## 2019-08-15 ENCOUNTER — Ambulatory Visit (INDEPENDENT_AMBULATORY_CARE_PROVIDER_SITE_OTHER): Payer: BLUE CROSS/BLUE SHIELD | Admitting: *Deleted

## 2019-08-15 DIAGNOSIS — Z5181 Encounter for therapeutic drug level monitoring: Secondary | ICD-10-CM | POA: Diagnosis not present

## 2019-08-15 DIAGNOSIS — I4891 Unspecified atrial fibrillation: Secondary | ICD-10-CM | POA: Diagnosis not present

## 2019-08-15 LAB — POCT INR: INR: 3.7 — AB (ref 2.0–3.0)

## 2019-08-15 NOTE — Patient Instructions (Signed)
Description   Skip tomorrow's dosage of Coumadin, then start taking 1.5 tablets daily except for 1 tablet on Monday and Fridays.  Recheck INR in 3 weeks. [facility]in Clinic [phone]

## 2019-09-12 ENCOUNTER — Ambulatory Visit (INDEPENDENT_AMBULATORY_CARE_PROVIDER_SITE_OTHER): Payer: 59 | Admitting: *Deleted

## 2019-09-12 DIAGNOSIS — I4891 Unspecified atrial fibrillation: Secondary | ICD-10-CM

## 2019-09-12 DIAGNOSIS — Z5181 Encounter for therapeutic drug level monitoring: Secondary | ICD-10-CM

## 2019-09-12 LAB — POCT INR: INR: 2.7 (ref 2.0–3.0)

## 2019-09-12 NOTE — Patient Instructions (Addendum)
Description   Continue taking the dose you have been taking which is 1.5 tablets daily except for 1 tablet on Fridays.  Recheck INR in 4 weeks. [facility]madin Clinic [phone]

## 2019-09-20 ENCOUNTER — Other Ambulatory Visit: Payer: Self-pay | Admitting: Internal Medicine

## 2019-09-29 ENCOUNTER — Other Ambulatory Visit (HOSPITAL_COMMUNITY): Payer: Self-pay

## 2019-09-29 MED ORDER — WARFARIN SODIUM 3 MG PO TABS: ORAL_TABLET | ORAL | 1 refills | Status: DC

## 2019-10-10 ENCOUNTER — Other Ambulatory Visit: Payer: Self-pay

## 2019-10-10 ENCOUNTER — Ambulatory Visit (INDEPENDENT_AMBULATORY_CARE_PROVIDER_SITE_OTHER): Payer: 59

## 2019-10-10 DIAGNOSIS — Z5181 Encounter for therapeutic drug level monitoring: Secondary | ICD-10-CM | POA: Diagnosis not present

## 2019-10-10 DIAGNOSIS — I4891 Unspecified atrial fibrillation: Secondary | ICD-10-CM

## 2019-10-10 LAB — POCT INR: INR: 2.8 (ref 2.0–3.0)

## 2019-10-10 NOTE — Patient Instructions (Signed)
Description   Continue taking the dose you have been taking which is 1.5 tablets daily except for 1 tablet on Fridays.  Recheck INR in 5 weeks. Coumadin Clinic [phone]

## 2019-11-14 ENCOUNTER — Other Ambulatory Visit: Payer: Self-pay

## 2019-11-14 ENCOUNTER — Encounter (INDEPENDENT_AMBULATORY_CARE_PROVIDER_SITE_OTHER): Payer: Self-pay

## 2019-11-14 ENCOUNTER — Ambulatory Visit (INDEPENDENT_AMBULATORY_CARE_PROVIDER_SITE_OTHER): Payer: 59

## 2019-11-14 DIAGNOSIS — Z5181 Encounter for therapeutic drug level monitoring: Secondary | ICD-10-CM

## 2019-11-14 DIAGNOSIS — I4891 Unspecified atrial fibrillation: Secondary | ICD-10-CM | POA: Diagnosis not present

## 2019-11-14 LAB — POCT INR: INR: 1.1 — AB (ref 2.0–3.0)

## 2019-11-14 NOTE — Patient Instructions (Signed)
Description   Take an extra 1 tablet today, then take 2 tablets tomorrow, then resume same dosage 1.5 tablets daily except for 1 tablet on Fridays.  Recheck INR in 2 weeks. [facility]in Clinic [phone]

## 2019-12-12 ENCOUNTER — Ambulatory Visit (INDEPENDENT_AMBULATORY_CARE_PROVIDER_SITE_OTHER): Payer: 59 | Admitting: *Deleted

## 2019-12-12 ENCOUNTER — Other Ambulatory Visit: Payer: Self-pay

## 2019-12-12 DIAGNOSIS — Z5181 Encounter for therapeutic drug level monitoring: Secondary | ICD-10-CM | POA: Diagnosis not present

## 2019-12-12 DIAGNOSIS — I4891 Unspecified atrial fibrillation: Secondary | ICD-10-CM | POA: Diagnosis not present

## 2019-12-12 LAB — POCT INR: INR: 2.8 (ref 2.0–3.0)

## 2019-12-12 NOTE — Patient Instructions (Signed)
Description   Continue taking the dose you have been taking, which is: 1.5 tablets daily. Recheck INR in 4 weeks. [facility]madin Clinic [phone]

## 2020-01-09 ENCOUNTER — Other Ambulatory Visit: Payer: Self-pay

## 2020-01-09 ENCOUNTER — Ambulatory Visit (INDEPENDENT_AMBULATORY_CARE_PROVIDER_SITE_OTHER): Payer: 59

## 2020-01-09 DIAGNOSIS — Z5181 Encounter for therapeutic drug level monitoring: Secondary | ICD-10-CM

## 2020-01-09 DIAGNOSIS — I4891 Unspecified atrial fibrillation: Secondary | ICD-10-CM | POA: Diagnosis not present

## 2020-01-09 LAB — POCT INR: INR: 1.4 — AB (ref 2.0–3.0)

## 2020-01-09 NOTE — Patient Instructions (Signed)
Description   Take and extra 1/2 tablet today, and then 2 tablets tomorrow, then resume same dosage 1.5 tablets daily. Recheck INR in 2 weeks. [facility]in Clinic [phone]

## 2020-01-23 ENCOUNTER — Other Ambulatory Visit: Payer: Self-pay

## 2020-01-23 ENCOUNTER — Ambulatory Visit (INDEPENDENT_AMBULATORY_CARE_PROVIDER_SITE_OTHER): Payer: 59 | Admitting: *Deleted

## 2020-01-23 DIAGNOSIS — Z5181 Encounter for therapeutic drug level monitoring: Secondary | ICD-10-CM

## 2020-01-23 DIAGNOSIS — I4891 Unspecified atrial fibrillation: Secondary | ICD-10-CM

## 2020-01-23 LAB — POCT INR: INR: 1.2 — AB (ref 2.0–3.0)

## 2020-01-23 NOTE — Patient Instructions (Signed)
Description   Take and extra 1/2 tablet today, and then 2 tablets tomorrow, then start taking 1.5 tablets daily except 2 tablets on Mondays and Fridays. Recheck INR in 2 weeks. [facility]in Clinic [phone]

## 2020-02-06 ENCOUNTER — Other Ambulatory Visit: Payer: Self-pay

## 2020-02-06 ENCOUNTER — Ambulatory Visit (INDEPENDENT_AMBULATORY_CARE_PROVIDER_SITE_OTHER): Payer: 59 | Admitting: Pharmacist

## 2020-02-06 DIAGNOSIS — I4891 Unspecified atrial fibrillation: Secondary | ICD-10-CM

## 2020-02-06 DIAGNOSIS — Z5181 Encounter for therapeutic drug level monitoring: Secondary | ICD-10-CM | POA: Diagnosis not present

## 2020-02-06 LAB — POCT INR: INR: 1.6 — AB (ref 2.0–3.0)

## 2020-02-06 NOTE — Patient Instructions (Signed)
Description   Take and extra 1/2 tablet today and tomorrow, then start taking the dose you are supposed to be taking which is 1.5 tablets daily except 2 tablets on Mondays and Fridays. Recheck INR in 2 weeks. Coumadin Clinic [phone]

## 2020-02-20 ENCOUNTER — Other Ambulatory Visit: Payer: Self-pay

## 2020-02-20 ENCOUNTER — Ambulatory Visit (INDEPENDENT_AMBULATORY_CARE_PROVIDER_SITE_OTHER): Payer: 59 | Admitting: Pharmacist

## 2020-02-20 DIAGNOSIS — Z5181 Encounter for therapeutic drug level monitoring: Secondary | ICD-10-CM | POA: Diagnosis not present

## 2020-02-20 DIAGNOSIS — I4891 Unspecified atrial fibrillation: Secondary | ICD-10-CM

## 2020-02-20 LAB — POCT INR: INR: 1.4 — AB (ref 2.0–3.0)

## 2020-02-20 NOTE — Patient Instructions (Signed)
Description   Take 2 tablets on Monday, Wednesday, Friday and Saturday, and 1.5 tablets all other days of the week.  Please call if you need to reschedule [phone]

## 2020-02-27 ENCOUNTER — Ambulatory Visit (INDEPENDENT_AMBULATORY_CARE_PROVIDER_SITE_OTHER): Payer: 59 | Admitting: *Deleted

## 2020-02-27 ENCOUNTER — Other Ambulatory Visit: Payer: Self-pay

## 2020-02-27 DIAGNOSIS — I4891 Unspecified atrial fibrillation: Secondary | ICD-10-CM

## 2020-02-27 DIAGNOSIS — Z5181 Encounter for therapeutic drug level monitoring: Secondary | ICD-10-CM | POA: Diagnosis not present

## 2020-02-27 LAB — POCT INR: INR: 1.8 — AB (ref 2.0–3.0)

## 2020-02-27 NOTE — Patient Instructions (Signed)
Description    Take 2.5 tablets today then start taking 2 tablets daily except for 1.5 tablets on Tuesday and Thursday. Recheck INR in 2 weeks. Please call if you need to reschedule [phone]

## 2020-03-12 ENCOUNTER — Other Ambulatory Visit: Payer: Self-pay

## 2020-03-12 ENCOUNTER — Ambulatory Visit (INDEPENDENT_AMBULATORY_CARE_PROVIDER_SITE_OTHER): Payer: 59

## 2020-03-12 DIAGNOSIS — Z5181 Encounter for therapeutic drug level monitoring: Secondary | ICD-10-CM

## 2020-03-12 DIAGNOSIS — I4891 Unspecified atrial fibrillation: Secondary | ICD-10-CM | POA: Diagnosis not present

## 2020-03-12 LAB — POCT INR: INR: 4.4 — AB (ref 2.0–3.0)

## 2020-03-12 NOTE — Patient Instructions (Signed)
Hold Saturday and Sunday then continue taking 2 tablets daily except for 1.5 tablets on Tuesday and Thursday. Continue green consumption on regular basis. Recheck INR in 3 weeks. Please call if you need to reschedule [phone]

## 2020-03-15 ENCOUNTER — Telehealth: Payer: Self-pay

## 2020-03-19 NOTE — Telephone Encounter (Signed)
done

## 2020-03-26 ENCOUNTER — Other Ambulatory Visit (HOSPITAL_COMMUNITY): Payer: Self-pay | Admitting: Internal Medicine

## 2020-04-01 ENCOUNTER — Ambulatory Visit (INDEPENDENT_AMBULATORY_CARE_PROVIDER_SITE_OTHER): Payer: 59 | Admitting: *Deleted

## 2020-04-01 ENCOUNTER — Other Ambulatory Visit: Payer: Self-pay

## 2020-04-01 DIAGNOSIS — Z5181 Encounter for therapeutic drug level monitoring: Secondary | ICD-10-CM

## 2020-04-01 DIAGNOSIS — I4891 Unspecified atrial fibrillation: Secondary | ICD-10-CM

## 2020-04-01 LAB — POCT INR: INR: 3 (ref 2.0–3.0)

## 2020-04-01 NOTE — Patient Instructions (Signed)
Description   Continue taking 2 tablets daily except for 1.5 tablets on Tuesday and Thursdays. Continue green consumption on regular basis. Recheck INR in 3 weeks. Please call if you need to reschedule [phone]

## 2020-04-22 ENCOUNTER — Other Ambulatory Visit: Payer: Self-pay

## 2020-04-22 ENCOUNTER — Ambulatory Visit (INDEPENDENT_AMBULATORY_CARE_PROVIDER_SITE_OTHER): Payer: 59 | Admitting: Pharmacist

## 2020-04-22 DIAGNOSIS — Z5181 Encounter for therapeutic drug level monitoring: Secondary | ICD-10-CM

## 2020-04-22 DIAGNOSIS — I4891 Unspecified atrial fibrillation: Secondary | ICD-10-CM | POA: Diagnosis not present

## 2020-04-22 LAB — POCT INR: INR: 2.8 (ref 2.0–3.0)

## 2020-04-22 MED ORDER — APIXABAN 5 MG PO TABS: 5.0000 mg | ORAL_TABLET | Freq: Two times a day (BID) | ORAL | 5 refills | Status: DC

## 2020-04-22 NOTE — Patient Instructions (Signed)
Description   Stop taking warfarin. Starting Saturday 8/28 in the morning, begin Eliquis 5mg  1 tablet twice a day. Space your doses apart every 12 hours.

## 2020-10-18 DIAGNOSIS — E1169 Type 2 diabetes mellitus with other specified complication: Secondary | ICD-10-CM | POA: Diagnosis not present

## 2020-10-24 ENCOUNTER — Other Ambulatory Visit: Payer: Self-pay | Admitting: Internal Medicine

## 2020-10-25 DIAGNOSIS — E0822 Diabetes mellitus due to underlying condition with diabetic chronic kidney disease: Secondary | ICD-10-CM | POA: Diagnosis not present

## 2020-10-25 DIAGNOSIS — E1169 Type 2 diabetes mellitus with other specified complication: Secondary | ICD-10-CM | POA: Diagnosis not present

## 2020-10-25 DIAGNOSIS — N183 Chronic kidney disease, stage 3 unspecified: Secondary | ICD-10-CM | POA: Diagnosis not present

## 2020-10-25 DIAGNOSIS — E785 Hyperlipidemia, unspecified: Secondary | ICD-10-CM | POA: Diagnosis not present

## 2021-01-13 DIAGNOSIS — E1169 Type 2 diabetes mellitus with other specified complication: Secondary | ICD-10-CM | POA: Diagnosis not present

## 2021-01-28 DIAGNOSIS — I5022 Chronic systolic (congestive) heart failure: Secondary | ICD-10-CM | POA: Diagnosis not present

## 2021-01-28 DIAGNOSIS — N183 Chronic kidney disease, stage 3 unspecified: Secondary | ICD-10-CM | POA: Diagnosis not present

## 2021-01-28 DIAGNOSIS — N1831 Chronic kidney disease, stage 3a: Secondary | ICD-10-CM | POA: Diagnosis not present

## 2021-01-28 DIAGNOSIS — Z789 Other specified health status: Secondary | ICD-10-CM | POA: Diagnosis not present

## 2021-01-28 DIAGNOSIS — E0822 Diabetes mellitus due to underlying condition with diabetic chronic kidney disease: Secondary | ICD-10-CM | POA: Diagnosis not present

## 2021-02-25 DIAGNOSIS — J449 Chronic obstructive pulmonary disease, unspecified: Secondary | ICD-10-CM | POA: Diagnosis not present

## 2021-02-25 DIAGNOSIS — E1169 Type 2 diabetes mellitus with other specified complication: Secondary | ICD-10-CM | POA: Diagnosis not present

## 2021-02-25 DIAGNOSIS — E785 Hyperlipidemia, unspecified: Secondary | ICD-10-CM | POA: Diagnosis not present

## 2021-03-28 DIAGNOSIS — E1169 Type 2 diabetes mellitus with other specified complication: Secondary | ICD-10-CM | POA: Diagnosis not present

## 2021-03-28 DIAGNOSIS — E785 Hyperlipidemia, unspecified: Secondary | ICD-10-CM | POA: Diagnosis not present

## 2021-03-28 DIAGNOSIS — J449 Chronic obstructive pulmonary disease, unspecified: Secondary | ICD-10-CM | POA: Diagnosis not present

## 2021-04-06 ENCOUNTER — Emergency Department (HOSPITAL_COMMUNITY): Payer: Medicare Other

## 2021-04-06 ENCOUNTER — Inpatient Hospital Stay (HOSPITAL_COMMUNITY)
Admission: EM | Admit: 2021-04-06 | Discharge: 2021-04-13 | DRG: 637 | Disposition: A | Discharge status: Skilled Nursing Facility | Payer: Medicare Other | Attending: Internal Medicine | Admitting: Internal Medicine

## 2021-04-06 DIAGNOSIS — I739 Peripheral vascular disease, unspecified: Secondary | ICD-10-CM | POA: Diagnosis not present

## 2021-04-06 DIAGNOSIS — I5042 Chronic combined systolic (congestive) and diastolic (congestive) heart failure: Secondary | ICD-10-CM | POA: Diagnosis not present

## 2021-04-06 DIAGNOSIS — Z20822 Contact with and (suspected) exposure to covid-19: Secondary | ICD-10-CM | POA: Diagnosis present

## 2021-04-06 DIAGNOSIS — R404 Transient alteration of awareness: Secondary | ICD-10-CM | POA: Diagnosis not present

## 2021-04-06 DIAGNOSIS — G9340 Encephalopathy, unspecified: Secondary | ICD-10-CM | POA: Diagnosis not present

## 2021-04-06 DIAGNOSIS — Z7951 Long term (current) use of inhaled steroids: Secondary | ICD-10-CM | POA: Diagnosis not present

## 2021-04-06 DIAGNOSIS — Z8673 Personal history of transient ischemic attack (TIA), and cerebral infarction without residual deficits: Secondary | ICD-10-CM

## 2021-04-06 DIAGNOSIS — E869 Volume depletion, unspecified: Secondary | ICD-10-CM | POA: Diagnosis not present

## 2021-04-06 DIAGNOSIS — I480 Paroxysmal atrial fibrillation: Secondary | ICD-10-CM | POA: Diagnosis present

## 2021-04-06 DIAGNOSIS — I130 Hypertensive heart and chronic kidney disease with heart failure and stage 1 through stage 4 chronic kidney disease, or unspecified chronic kidney disease: Secondary | ICD-10-CM | POA: Diagnosis not present

## 2021-04-06 DIAGNOSIS — Z833 Family history of diabetes mellitus: Secondary | ICD-10-CM

## 2021-04-06 DIAGNOSIS — Z9119 Patient's noncompliance with other medical treatment and regimen: Secondary | ICD-10-CM

## 2021-04-06 DIAGNOSIS — E1122 Type 2 diabetes mellitus with diabetic chronic kidney disease: Secondary | ICD-10-CM | POA: Diagnosis not present

## 2021-04-06 DIAGNOSIS — Z7901 Long term (current) use of anticoagulants: Secondary | ICD-10-CM

## 2021-04-06 DIAGNOSIS — I471 Supraventricular tachycardia: Secondary | ICD-10-CM | POA: Diagnosis not present

## 2021-04-06 DIAGNOSIS — E782 Mixed hyperlipidemia: Secondary | ICD-10-CM | POA: Diagnosis not present

## 2021-04-06 DIAGNOSIS — Z79899 Other long term (current) drug therapy: Secondary | ICD-10-CM | POA: Diagnosis not present

## 2021-04-06 DIAGNOSIS — R410 Disorientation, unspecified: Secondary | ICD-10-CM | POA: Diagnosis not present

## 2021-04-06 DIAGNOSIS — K5730 Diverticulosis of large intestine without perforation or abscess without bleeding: Secondary | ICD-10-CM | POA: Diagnosis not present

## 2021-04-06 DIAGNOSIS — E1110 Type 2 diabetes mellitus with ketoacidosis without coma: Principal | ICD-10-CM | POA: Diagnosis present

## 2021-04-06 DIAGNOSIS — D72829 Elevated white blood cell count, unspecified: Secondary | ICD-10-CM | POA: Diagnosis not present

## 2021-04-06 DIAGNOSIS — K5900 Constipation, unspecified: Secondary | ICD-10-CM | POA: Diagnosis not present

## 2021-04-06 DIAGNOSIS — N2889 Other specified disorders of kidney and ureter: Secondary | ICD-10-CM | POA: Diagnosis not present

## 2021-04-06 DIAGNOSIS — I5032 Chronic diastolic (congestive) heart failure: Secondary | ICD-10-CM | POA: Diagnosis not present

## 2021-04-06 DIAGNOSIS — E46 Unspecified protein-calorie malnutrition: Secondary | ICD-10-CM | POA: Diagnosis not present

## 2021-04-06 DIAGNOSIS — R4182 Altered mental status, unspecified: Secondary | ICD-10-CM | POA: Diagnosis present

## 2021-04-06 DIAGNOSIS — E876 Hypokalemia: Secondary | ICD-10-CM | POA: Diagnosis not present

## 2021-04-06 DIAGNOSIS — Z87891 Personal history of nicotine dependence: Secondary | ICD-10-CM | POA: Diagnosis not present

## 2021-04-06 DIAGNOSIS — N1831 Chronic kidney disease, stage 3a: Secondary | ICD-10-CM | POA: Diagnosis not present

## 2021-04-06 DIAGNOSIS — R2681 Unsteadiness on feet: Secondary | ICD-10-CM | POA: Diagnosis not present

## 2021-04-06 DIAGNOSIS — Z794 Long term (current) use of insulin: Secondary | ICD-10-CM | POA: Diagnosis not present

## 2021-04-06 DIAGNOSIS — I10 Essential (primary) hypertension: Secondary | ICD-10-CM | POA: Diagnosis present

## 2021-04-06 DIAGNOSIS — R000 Tachycardia, unspecified: Secondary | ICD-10-CM | POA: Diagnosis not present

## 2021-04-06 DIAGNOSIS — Z8719 Personal history of other diseases of the digestive system: Secondary | ICD-10-CM | POA: Diagnosis not present

## 2021-04-06 DIAGNOSIS — G9341 Metabolic encephalopathy: Secondary | ICD-10-CM | POA: Diagnosis present

## 2021-04-06 DIAGNOSIS — M6281 Muscle weakness (generalized): Secondary | ICD-10-CM | POA: Diagnosis not present

## 2021-04-06 DIAGNOSIS — R739 Hyperglycemia, unspecified: Secondary | ICD-10-CM | POA: Diagnosis not present

## 2021-04-06 DIAGNOSIS — K8020 Calculus of gallbladder without cholecystitis without obstruction: Secondary | ICD-10-CM | POA: Diagnosis not present

## 2021-04-06 DIAGNOSIS — Z89512 Acquired absence of left leg below knee: Secondary | ICD-10-CM

## 2021-04-06 DIAGNOSIS — E1169 Type 2 diabetes mellitus with other specified complication: Secondary | ICD-10-CM | POA: Diagnosis not present

## 2021-04-06 DIAGNOSIS — Z743 Need for continuous supervision: Secondary | ICD-10-CM | POA: Diagnosis not present

## 2021-04-06 DIAGNOSIS — J45909 Unspecified asthma, uncomplicated: Secondary | ICD-10-CM | POA: Diagnosis not present

## 2021-04-06 DIAGNOSIS — R197 Diarrhea, unspecified: Secondary | ICD-10-CM | POA: Diagnosis not present

## 2021-04-06 LAB — COMPREHENSIVE METABOLIC PANEL
ALT: 17 U/L (ref 0–44)
AST: 34 U/L (ref 15–41)
Albumin: 3.8 g/dL (ref 3.5–5.0)
Alkaline Phosphatase: 90 U/L (ref 38–126)
Anion gap: 19 — ABNORMAL HIGH (ref 5–15)
BUN: 35 mg/dL — ABNORMAL HIGH (ref 8–23)
CO2: 14 mmol/L — ABNORMAL LOW (ref 22–32)
Calcium: 8.9 mg/dL (ref 8.9–10.3)
Chloride: 105 mmol/L (ref 98–111)
Creatinine, Ser: 1.96 mg/dL — ABNORMAL HIGH (ref 0.61–1.24)
GFR, Estimated: 37 mL/min — ABNORMAL LOW (ref >60)
Glucose, Bld: 448 mg/dL — ABNORMAL HIGH (ref 70–99)
Potassium: 4.1 mmol/L (ref 3.5–5.1)
Sodium: 138 mmol/L (ref 135–145)
Total Bilirubin: 1.5 mg/dL — ABNORMAL HIGH (ref 0.3–1.2)
Total Protein: 7.5 g/dL (ref 6.5–8.1)

## 2021-04-06 LAB — CBC WITH DIFFERENTIAL/PLATELET
Abs Immature Granulocytes: 0.18 10*3/uL — ABNORMAL HIGH (ref 0.00–0.07)
Basophils Absolute: 0 10*3/uL (ref 0.0–0.1)
Basophils Relative: 0 %
Eosinophils Absolute: 0 10*3/uL (ref 0.0–0.5)
Eosinophils Relative: 0 %
HCT: 54.9 % — ABNORMAL HIGH (ref 39.0–52.0)
Hemoglobin: 17.9 g/dL — ABNORMAL HIGH (ref 13.0–17.0)
Immature Granulocytes: 1 %
Lymphocytes Relative: 5 %
Lymphs Abs: 1.2 10*3/uL (ref 0.7–4.0)
MCH: 32 pg (ref 26.0–34.0)
MCHC: 32.6 g/dL (ref 30.0–36.0)
MCV: 98.2 fL (ref 80.0–100.0)
Monocytes Absolute: 1.2 10*3/uL — ABNORMAL HIGH (ref 0.1–1.0)
Monocytes Relative: 5 %
Neutro Abs: 22 10*3/uL — ABNORMAL HIGH (ref 1.7–7.7)
Neutrophils Relative %: 89 %
Platelets: 280 10*3/uL (ref 150–400)
RBC: 5.59 MIL/uL (ref 4.22–5.81)
RDW: 13.4 % (ref 11.5–15.5)
WBC: 24.6 10*3/uL — ABNORMAL HIGH (ref 4.0–10.5)
nRBC: 0 % (ref 0.0–0.2)

## 2021-04-06 LAB — I-STAT VENOUS BLOOD GAS, ED
Acid-base deficit: 8 mmol/L — ABNORMAL HIGH (ref 0.0–2.0)
Bicarbonate: 16.3 mmol/L — ABNORMAL LOW (ref 20.0–28.0)
Calcium, Ion: 1.1 mmol/L — ABNORMAL LOW (ref 1.15–1.40)
HCT: 52 % (ref 39.0–52.0)
Hemoglobin: 17.7 g/dL — ABNORMAL HIGH (ref 13.0–17.0)
O2 Saturation: 68 %
Potassium: 4.4 mmol/L (ref 3.5–5.1)
Sodium: 142 mmol/L (ref 135–145)
TCO2: 17 mmol/L — ABNORMAL LOW (ref 22–32)
pCO2, Ven: 31.9 mmHg — ABNORMAL LOW (ref 44.0–60.0)
pH, Ven: 7.318 (ref 7.250–7.430)
pO2, Ven: 38 mmHg (ref 32.0–45.0)

## 2021-04-06 LAB — TROPONIN I (HIGH SENSITIVITY): Troponin I (High Sensitivity): 47 ng/L — ABNORMAL HIGH (ref <18)

## 2021-04-06 LAB — ETHANOL: Alcohol, Ethyl (B): <10 mg/dL (ref <10)

## 2021-04-06 LAB — CBG MONITORING, ED: Glucose-Capillary: 454 mg/dL — ABNORMAL HIGH (ref 70–99)

## 2021-04-06 MED ORDER — INSULIN REGULAR(HUMAN) IN NACL 100-0.9 UT/100ML-% IV SOLN
INTRAVENOUS | Status: DC
  Administered 2021-04-07: 9.5 [IU]/h via INTRAVENOUS
  Filled 2021-04-06: qty 100

## 2021-04-06 MED ORDER — DEXTROSE IN LACTATED RINGERS 5 % IV SOLN: INTRAVENOUS | Status: DC

## 2021-04-06 MED ORDER — LACTATED RINGERS IV BOLUS
1000.0000 mL | INTRAVENOUS | Status: AC
  Administered 2021-04-06: 1000 mL via INTRAVENOUS

## 2021-04-06 MED ORDER — LACTATED RINGERS IV SOLN: INTRAVENOUS | Status: DC

## 2021-04-06 MED ORDER — DEXTROSE 50 % IV SOLN: 0.0000 mL | INTRAVENOUS | Status: DC | PRN

## 2021-04-06 MED ORDER — LACTATED RINGERS IV BOLUS
1000.0000 mL | Freq: Once | INTRAVENOUS | Status: AC
  Administered 2021-04-07: 1000 mL via INTRAVENOUS

## 2021-04-06 MED ORDER — POTASSIUM CHLORIDE 10 MEQ/100ML IV SOLN
10.0000 meq | INTRAVENOUS | Status: AC
  Administered 2021-04-07 (×2): 10 meq via INTRAVENOUS
  Filled 2021-04-06 (×2): qty 100

## 2021-04-06 NOTE — ED Provider Notes (Signed)
Patient seen in conjunction with [place] PA-C. Pt with elevated blood sugar, encephalopathy. Unclear cause, work-up pending. Will need admission. Pt is unable to give a history. Wife contacted and no additional info obtained beyond that he was not acting normally today.   Labs with elevated WBC count. No obvious infection on arrival. Metabolic acidosis with elevated AG noted, concerning for element of DKA.   BP (!) 163/125 (BP Location: Right Arm)   Pulse 100   Temp 98 F (36.7 C)   Resp 16   SpO2 100%   Discussed with [facility] for admission. Prior to that discussion, EKG obtained and showed SVT. Dr. Ralene Bathe aware, IV metoprolol ordered.      Carlisle Cater, PA-C 04/08/21 [id number]    Blanchie Dessert, MD 04/11/21 [phone]

## 2021-04-06 NOTE — ED Notes (Signed)
Unable to obtain EKG at this time, pt is combative and rolling on his side.

## 2021-04-06 NOTE — ED Provider Notes (Signed)
[facility] EMERGENCY DEPARTMENT Provider Note   CSN: [id number] Arrival date & time: 04/06/21  1952     History Chief Complaint  Patient presents with   Altered Mental Status    EMS arrival. AMS/hyperglycemic. Pt arrives from home, lives w/ wife. Aox3. Left BKA. BG 518 per EMS.     AVON BRAHMBHATT is a 65 y.o. male.  The history is provided by the spouse.  Altered Mental Status Presenting symptoms: confusion   Most recent episode:  Today Episode history:  Single  Mr. Ashawn Ortegon is a 65 y/o male with PMH T2DM, left BKA, CHF, CKD stage 3, and HTN who presents to the ED via EMS for altered mental status and hyperglycemia. History obtained via wife [phone]) who states patient has "not been acting right all day". She states that the patient has been "spaced out" and has not been eating or drinking today. She does not believe he took any of his medications today or yesterday. Blood glucose from EMS was 518.      Past Medical History:  Diagnosis Date   Amputation of leg ([place])    Atrial fibrillation ([place])    CHF (congestive heart failure) ([place])    chronic systolic CHF   Chronic renal failure    Diabetes mellitus    type 2   Hypertension     Patient Active Problem List   Diagnosis Date Noted   Cellulitis 02/18/2016   Chronic diastolic CHF (congestive heart failure) ([place]) 05/05/2015   Encounter for therapeutic drug monitoring 11/21/2013   Chronic anticoagulation 06/14/2013   GIB (gastrointestinal bleeding) 06/13/2013   Morbid obesity ([place]) 02/17/2013   Open wound of knee, leg (except thigh), and ankle, without mention of complication [id number]   DIABETES MELLITUS, TYPE II 08/12/2010   CARDIOMYOPATHY, DILATED 08/12/2010   CHRONIC KIDNEY DISEASE STAGE III (MODERATE) 08/12/2010   BKA, LEFT LEG 08/12/2010   Essential hypertension 08/09/2010   Atrial fibrillation ([place]) 08/09/2010    Past Surgical History:  Procedure Laterality Date   BELOW KNEE  LEG AMPUTATION  06/29/2010   Left    COLONOSCOPY N/A 06/15/2013   Procedure: COLONOSCOPY;  Surgeon: Lear Ng, MD;  Location: [facility];  Service: Endoscopy;  Laterality: N/A;   ESOPHAGOGASTRODUODENOSCOPY N/A 06/14/2013   Procedure: ESOPHAGOGASTRODUODENOSCOPY (EGD);  Surgeon: Lear Ng, MD;  Location: [facility] ENDOSCOPY;  Service: Endoscopy;  Laterality: N/A;       Family History  Problem Relation Age of Onset   Diabetes Other     Social History   Tobacco Use   Smoking status: Former    Types: Cigarettes    Quit date: 08/28/2001    Years since quitting: 19.6  Substance Use Topics   Alcohol use: No   Drug use: Yes    Types: Marijuana    Comment: no marijuana in the past month    Home Medications Prior to Admission medications   Medication Sig Start Date End Date Taking? Authorizing Provider  albuterol (PROVENTIL) (2.5 MG/3ML) 0.083% nebulizer solution Take 2.5 mg by nebulization every 6 (six) hours as needed for wheezing or shortness of breath.    [provider]  apixaban (ELIQUIS) 5 MG TABS tablet Take 1 tablet (5 mg total) by mouth 2 (two) times daily. NEEDS APPOINTMENT FOR FUTURE REFILLS 10/25/20   Bensimhon, Shaune Pascal, MD  Ascorbic Acid (VITAMIN C) 100 MG tablet Take 100 mg by mouth daily.    [provider]  budesonide-formoterol (SYMBICORT) 160-4.5 MCG/ACT inhaler  Inhale 2 puffs into the lungs 2 (two) times daily.    [provider]  carvedilol (COREG) 25 MG tablet Take 25 mg by mouth 2 (two) times daily with a meal.    [provider]  docusate sodium (COLACE) 100 MG capsule Take 1 capsule (100 mg total) by mouth 2 (two) times daily. 02/21/16   Kelvin Cellar, MD  Exenatide (BYDUREON [place]) Inject 2 mg into the skin once a week.    [provider]  furosemide (LASIX) 20 MG tablet Take 1 tablet (20 mg total) by mouth daily. 07/03/14   Rande Brunt, CRNA  insulin glargine (LANTUS) 100 UNIT/ML injection Inject 0.2  mLs (20 Units total) into the skin at bedtime. 02/21/16   Kelvin Cellar, MD  lisinopril (PRINIVIL,ZESTRIL) 10 MG tablet Take 10 mg by mouth daily.    [provider]  pantoprazole (PROTONIX) 40 MG tablet Take 1 tablet (40 mg total) by mouth daily. 06/16/13   Ivor Costa, MD  sertraline (ZOLOFT) 25 MG tablet Take 25 mg by mouth daily.    [provider]  simvastatin (ZOCOR) 10 MG tablet Take 10 mg by mouth daily.    [provider]    Allergies    Patient has no known allergies.  Review of Systems   Review of Systems  Reason unable to perform ROS: altered mental status.  Psychiatric/Behavioral:  Positive for confusion.    Physical Exam Updated Vital Signs BP (!) 163/125 (BP Location: Right Arm)   Pulse 100   Temp 98 F (36.7 C)   Resp 16   SpO2 100%   Physical Exam Vitals reviewed.  Constitutional:      Appearance: He is overweight.  Cardiovascular:     Rate and Rhythm: Normal rate and regular rhythm.  Pulmonary:     Effort: Pulmonary effort is normal.     Breath sounds: Normal breath sounds.  Abdominal:     Palpations: Abdomen is soft.  Skin:    General: Skin is warm and dry.  Neurological:     General: No focal deficit present.     Mental Status: He is lethargic and disoriented.     Comments: Oriented to person and place    ED Results / Procedures / Treatments   Labs (all labs ordered are listed, but only abnormal results are displayed) Labs Reviewed  CBC WITH DIFFERENTIAL/PLATELET - Abnormal; Notable for the following components:      Result Value   WBC 24.6 (*)    Hemoglobin 17.9 (*)    HCT 54.9 (*)    Neutro Abs 22.0 (*)    Monocytes Absolute 1.2 (*)    Abs Immature Granulocytes 0.18 (*)    All other components within normal limits  COMPREHENSIVE METABOLIC PANEL - Abnormal; Notable for the following components:   CO2 14 (*)    Glucose, Bld 448 (*)    BUN 35 (*)    Creatinine, Ser 1.96 (*)    Total Bilirubin 1.5 (*)    GFR,  Estimated 37 (*)    Anion gap 19 (*)    All other components within normal limits  CBG MONITORING, ED - Abnormal; Notable for the following components:   Glucose-Capillary 454 (*)    All other components within normal limits  I-STAT VENOUS BLOOD GAS, ED - Abnormal; Notable for the following components:   pCO2, Ven 31.9 (*)    Bicarbonate 16.3 (*)    TCO2 17 (*)    Acid-base deficit 8.0 (*)  Calcium, Ion 1.10 (*)    Hemoglobin 17.7 (*)    All other components within normal limits  RESP PANEL BY RT-PCR (FLU A&B, COVID) ARPGX2  ETHANOL  URINALYSIS, ROUTINE W REFLEX MICROSCOPIC  URINALYSIS, COMPLETE (UACMP) WITH MICROSCOPIC  AMMONIA  BETA-HYDROXYBUTYRIC ACID  CBG MONITORING, ED  TROPONIN I (HIGH SENSITIVITY)    EKG None  Radiology DG Chest 2 View  Result Date: 04/06/2021 CLINICAL DATA:  Altered mental status EXAM: CHEST - 2 VIEW COMPARISON:  09/22/2016 FINDINGS: Mild bronchitic changes. No focal consolidation or effusion. Normal cardiac size with aortic atherosclerosis. No pneumothorax. IMPRESSION: No active cardiopulmonary disease.  Bronchitic changes Electronically Signed   By: Donavan Foil M.D.   On: 04/06/2021 21:24   CT HEAD WO CONTRAST (5MM)  Result Date: 04/06/2021 CLINICAL DATA:  Altered mental status EXAM: CT HEAD WITHOUT CONTRAST TECHNIQUE: Contiguous axial images were obtained from the base of the skull through the vertex without intravenous contrast. COMPARISON:  None. FINDINGS: Brain: No evidence of acute infarction, hemorrhage, hydrocephalus, extra-axial collection or mass lesion/mass effect. Left basal ganglia lacunar infarct (series 3/image 20), likely chronic. Subcortical white matter and periventricular small vessel ischemic changes. Vascular: Intracranial atherosclerosis. Skull: Normal. Negative for fracture or focal lesion. Sinuses/Orbits: The visualized paranasal sinuses are essentially clear. The mastoid air cells are unopacified. Other: None. IMPRESSION: No  evidence of acute intracranial abnormality. Old left basal ganglia lacunar infarct. Small vessel ischemic changes. Electronically Signed   By: Julian Hy M.D.   On: 04/06/2021 22:52    Procedures .Critical Care  Date/Time: 04/06/2021 11:42 PM Performed by: Kateri Plummer, PA-C Authorized by: Kateri Plummer, PA-C   Critical care provider statement:    Critical care time (minutes):  45   Critical care was necessary to treat or prevent imminent or life-threatening deterioration of the following conditions:  Metabolic crisis   Critical care was time spent personally by me on the following activities:  Discussions with consultants, evaluation of patient's response to treatment, examination of patient, ordering and performing treatments and interventions, ordering and review of laboratory studies, ordering and review of radiographic studies, pulse oximetry, re-evaluation of patient's condition, obtaining history from patient or surrogate and review of old charts   I assumed direction of critical care for this patient from another provider in my specialty: no     Care discussed with: admitting provider     Medications Ordered in ED Medications  lactated ringers bolus 1,000 mL (1,000 mLs Intravenous New Bag/Given 04/06/21 2102)  insulin regular, human (MYXREDLIN) 100 units/ 100 mL infusion (has no administration in time range)  lactated ringers infusion (has no administration in time range)  dextrose 5 % in lactated ringers infusion (has no administration in time range)  dextrose 50 % solution 0-50 mL (has no administration in time range)  potassium chloride 10 mEq in 100 mL IVPB (has no administration in time range)  lactated ringers bolus 1,000 mL (has no administration in time range)    ED Course  I have reviewed the triage vital signs and the nursing notes.  Pertinent labs & imaging results that were available during my care of the patient were reviewed by me and considered in  my medical decision making (see chart for details).  Patient presents with AMS, history obtained via wife over phone. Patient has been altered all day. Workup for encephalopathy/AMS to include CBC, CMP, VBG, CXR, CT head w/o, EKG, troponin, ethanol, ammonia, UA. CBG 454. CXR and CT head unremarkable. Patient  given fluid bolus and insulin infusion.  Clinical Course as of 04/06/21 2340  Wed Apr 06, 2021  2000 Glucose-Capillary(!): 454 [LR]    Clinical Course User Index [LR] Delos Klich, Cecille Aver, PA-C   MDM Rules/Calculators/A&P                           Patient to be admitted for encephalopathy and DKA  Final Clinical Impression(s) / ED Diagnoses Final diagnoses:  Encephalopathy  Diabetic ketoacidosis without coma associated with type 2 diabetes mellitus [facility])    Rx / DC Orders ED Discharge Orders     None        Estill Cotta 04/06/21 2343    Blanchie Dessert, MD 04/11/21 0725

## 2021-04-07 ENCOUNTER — Inpatient Hospital Stay (HOSPITAL_COMMUNITY): Payer: Medicare Other

## 2021-04-07 ENCOUNTER — Encounter (HOSPITAL_COMMUNITY): Payer: Self-pay | Admitting: Internal Medicine

## 2021-04-07 DIAGNOSIS — E869 Volume depletion, unspecified: Secondary | ICD-10-CM | POA: Diagnosis not present

## 2021-04-07 DIAGNOSIS — R4182 Altered mental status, unspecified: Secondary | ICD-10-CM | POA: Diagnosis present

## 2021-04-07 DIAGNOSIS — E1122 Type 2 diabetes mellitus with diabetic chronic kidney disease: Secondary | ICD-10-CM | POA: Diagnosis present

## 2021-04-07 DIAGNOSIS — Z20822 Contact with and (suspected) exposure to covid-19: Secondary | ICD-10-CM | POA: Diagnosis present

## 2021-04-07 DIAGNOSIS — E782 Mixed hyperlipidemia: Secondary | ICD-10-CM | POA: Diagnosis present

## 2021-04-07 DIAGNOSIS — Z89512 Acquired absence of left leg below knee: Secondary | ICD-10-CM | POA: Diagnosis not present

## 2021-04-07 DIAGNOSIS — K5900 Constipation, unspecified: Secondary | ICD-10-CM | POA: Diagnosis not present

## 2021-04-07 DIAGNOSIS — I10 Essential (primary) hypertension: Secondary | ICD-10-CM

## 2021-04-07 DIAGNOSIS — Z9119 Patient's noncompliance with other medical treatment and regimen: Secondary | ICD-10-CM | POA: Diagnosis not present

## 2021-04-07 DIAGNOSIS — I5042 Chronic combined systolic (congestive) and diastolic (congestive) heart failure: Secondary | ICD-10-CM | POA: Diagnosis present

## 2021-04-07 DIAGNOSIS — E876 Hypokalemia: Secondary | ICD-10-CM | POA: Diagnosis not present

## 2021-04-07 DIAGNOSIS — Z87891 Personal history of nicotine dependence: Secondary | ICD-10-CM | POA: Diagnosis not present

## 2021-04-07 DIAGNOSIS — E1110 Type 2 diabetes mellitus with ketoacidosis without coma: Secondary | ICD-10-CM | POA: Diagnosis present

## 2021-04-07 DIAGNOSIS — Z833 Family history of diabetes mellitus: Secondary | ICD-10-CM | POA: Diagnosis not present

## 2021-04-07 DIAGNOSIS — Z79899 Other long term (current) drug therapy: Secondary | ICD-10-CM | POA: Diagnosis not present

## 2021-04-07 DIAGNOSIS — I5032 Chronic diastolic (congestive) heart failure: Secondary | ICD-10-CM | POA: Diagnosis not present

## 2021-04-07 DIAGNOSIS — N1831 Chronic kidney disease, stage 3a: Secondary | ICD-10-CM

## 2021-04-07 DIAGNOSIS — D72829 Elevated white blood cell count, unspecified: Secondary | ICD-10-CM | POA: Diagnosis present

## 2021-04-07 DIAGNOSIS — Z7951 Long term (current) use of inhaled steroids: Secondary | ICD-10-CM | POA: Diagnosis not present

## 2021-04-07 DIAGNOSIS — I480 Paroxysmal atrial fibrillation: Secondary | ICD-10-CM | POA: Diagnosis present

## 2021-04-07 DIAGNOSIS — G9341 Metabolic encephalopathy: Secondary | ICD-10-CM

## 2021-04-07 DIAGNOSIS — Z7901 Long term (current) use of anticoagulants: Secondary | ICD-10-CM | POA: Diagnosis not present

## 2021-04-07 DIAGNOSIS — I130 Hypertensive heart and chronic kidney disease with heart failure and stage 1 through stage 4 chronic kidney disease, or unspecified chronic kidney disease: Secondary | ICD-10-CM | POA: Diagnosis present

## 2021-04-07 DIAGNOSIS — Z8673 Personal history of transient ischemic attack (TIA), and cerebral infarction without residual deficits: Secondary | ICD-10-CM | POA: Diagnosis not present

## 2021-04-07 DIAGNOSIS — Z794 Long term (current) use of insulin: Secondary | ICD-10-CM | POA: Diagnosis not present

## 2021-04-07 DIAGNOSIS — I471 Supraventricular tachycardia: Secondary | ICD-10-CM | POA: Diagnosis present

## 2021-04-07 LAB — BASIC METABOLIC PANEL
Anion gap: 14 (ref 5–15)
Anion gap: 13 (ref 5–15)
Anion gap: 9 (ref 5–15)
Anion gap: 9 (ref 5–15)
Anion gap: 7 (ref 5–15)
BUN: 31 mg/dL — ABNORMAL HIGH (ref 8–23)
BUN: 30 mg/dL — ABNORMAL HIGH (ref 8–23)
BUN: 25 mg/dL — ABNORMAL HIGH (ref 8–23)
BUN: 24 mg/dL — ABNORMAL HIGH (ref 8–23)
BUN: 22 mg/dL (ref 8–23)
CO2: 18 mmol/L — ABNORMAL LOW (ref 22–32)
CO2: 16 mmol/L — ABNORMAL LOW (ref 22–32)
CO2: 22 mmol/L (ref 22–32)
CO2: 22 mmol/L (ref 22–32)
CO2: 23 mmol/L (ref 22–32)
Calcium: 9.3 mg/dL (ref 8.9–10.3)
Calcium: 9.2 mg/dL (ref 8.9–10.3)
Calcium: 9.3 mg/dL (ref 8.9–10.3)
Calcium: 9.2 mg/dL (ref 8.9–10.3)
Calcium: 8.8 mg/dL — ABNORMAL LOW (ref 8.9–10.3)
Chloride: 109 mmol/L (ref 98–111)
Chloride: 114 mmol/L — ABNORMAL HIGH (ref 98–111)
Chloride: 112 mmol/L — ABNORMAL HIGH (ref 98–111)
Chloride: 113 mmol/L — ABNORMAL HIGH (ref 98–111)
Chloride: 114 mmol/L — ABNORMAL HIGH (ref 98–111)
Creatinine, Ser: 1.56 mg/dL — ABNORMAL HIGH (ref 0.61–1.24)
Creatinine, Ser: 1.34 mg/dL — ABNORMAL HIGH (ref 0.61–1.24)
Creatinine, Ser: 1.25 mg/dL — ABNORMAL HIGH (ref 0.61–1.24)
Creatinine, Ser: 1.18 mg/dL (ref 0.61–1.24)
Creatinine, Ser: 1.07 mg/dL (ref 0.61–1.24)
GFR, Estimated: 49 mL/min — ABNORMAL LOW (ref >60)
GFR, Estimated: 58 mL/min — ABNORMAL LOW (ref >60)
GFR, Estimated: >60 mL/min (ref >60)
GFR, Estimated: >60 mL/min (ref >60)
GFR, Estimated: >60 mL/min (ref >60)
Glucose, Bld: 221 mg/dL — ABNORMAL HIGH (ref 70–99)
Glucose, Bld: 183 mg/dL — ABNORMAL HIGH (ref 70–99)
Glucose, Bld: 177 mg/dL — ABNORMAL HIGH (ref 70–99)
Glucose, Bld: 151 mg/dL — ABNORMAL HIGH (ref 70–99)
Glucose, Bld: 157 mg/dL — ABNORMAL HIGH (ref 70–99)
Potassium: 3.8 mmol/L (ref 3.5–5.1)
Potassium: 4.6 mmol/L (ref 3.5–5.1)
Potassium: 3.9 mmol/L (ref 3.5–5.1)
Potassium: 4.3 mmol/L (ref 3.5–5.1)
Potassium: 3.2 mmol/L — ABNORMAL LOW (ref 3.5–5.1)
Sodium: 141 mmol/L (ref 135–145)
Sodium: 143 mmol/L (ref 135–145)
Sodium: 143 mmol/L (ref 135–145)
Sodium: 144 mmol/L (ref 135–145)
Sodium: 144 mmol/L (ref 135–145)

## 2021-04-07 LAB — CBG MONITORING, ED
Glucose-Capillary: 120 mg/dL — ABNORMAL HIGH (ref 70–99)
Glucose-Capillary: 131 mg/dL — ABNORMAL HIGH (ref 70–99)
Glucose-Capillary: 143 mg/dL — ABNORMAL HIGH (ref 70–99)
Glucose-Capillary: 149 mg/dL — ABNORMAL HIGH (ref 70–99)
Glucose-Capillary: 177 mg/dL — ABNORMAL HIGH (ref 70–99)
Glucose-Capillary: 177 mg/dL — ABNORMAL HIGH (ref 70–99)
Glucose-Capillary: 185 mg/dL — ABNORMAL HIGH (ref 70–99)
Glucose-Capillary: 187 mg/dL — ABNORMAL HIGH (ref 70–99)
Glucose-Capillary: 206 mg/dL — ABNORMAL HIGH (ref 70–99)
Glucose-Capillary: 227 mg/dL — ABNORMAL HIGH (ref 70–99)
Glucose-Capillary: 389 mg/dL — ABNORMAL HIGH (ref 70–99)
Glucose-Capillary: 96 mg/dL (ref 70–99)

## 2021-04-07 LAB — URINALYSIS, COMPLETE (UACMP) WITH MICROSCOPIC
Bacteria, UA: NONE SEEN
Bilirubin Urine: NEGATIVE
Glucose, UA: >=500 mg/dL — AB
Ketones, ur: 80 mg/dL — AB
Leukocytes,Ua: NEGATIVE
Nitrite: NEGATIVE
Protein, ur: 100 mg/dL — AB
Specific Gravity, Urine: 1.028 (ref 1.005–1.030)
pH: 5 (ref 5.0–8.0)

## 2021-04-07 LAB — RAPID URINE DRUG SCREEN, HOSP PERFORMED
Amphetamines: NOT DETECTED
Barbiturates: NOT DETECTED
Benzodiazepines: NOT DETECTED
Cocaine: NOT DETECTED
Opiates: NOT DETECTED
Tetrahydrocannabinol: POSITIVE — AB

## 2021-04-07 LAB — PROCALCITONIN: Procalcitonin: <0.1 ng/mL

## 2021-04-07 LAB — FOLATE: Folate: 4.3 ng/mL — ABNORMAL LOW (ref >5.9)

## 2021-04-07 LAB — LACTIC ACID, PLASMA
Lactic Acid, Venous: 3.1 mmol/L (ref 0.5–1.9)
Lactic Acid, Venous: 2.2 mmol/L (ref 0.5–1.9)
Lactic Acid, Venous: 1.7 mmol/L (ref 0.5–1.9)

## 2021-04-07 LAB — GLUCOSE, CAPILLARY
Glucose-Capillary: 164 mg/dL — ABNORMAL HIGH (ref 70–99)
Glucose-Capillary: 221 mg/dL — ABNORMAL HIGH (ref 70–99)
Glucose-Capillary: 167 mg/dL — ABNORMAL HIGH (ref 70–99)
Glucose-Capillary: 154 mg/dL — ABNORMAL HIGH (ref 70–99)
Glucose-Capillary: 165 mg/dL — ABNORMAL HIGH (ref 70–99)

## 2021-04-07 LAB — VITAMIN B12: Vitamin B-12: 380 pg/mL (ref 180–914)

## 2021-04-07 LAB — RESP PANEL BY RT-PCR (FLU A&B, COVID) ARPGX2
Influenza A by PCR: NEGATIVE
Influenza B by PCR: NEGATIVE
SARS Coronavirus 2 by RT PCR: NEGATIVE

## 2021-04-07 LAB — HIV ANTIBODY (ROUTINE TESTING W REFLEX): HIV Screen 4th Generation wRfx: NONREACTIVE

## 2021-04-07 LAB — HEMOGLOBIN A1C
Hgb A1c MFr Bld: 12.9 % — ABNORMAL HIGH (ref 4.8–5.6)
Mean Plasma Glucose: 323.53 mg/dL

## 2021-04-07 LAB — TSH: TSH: 0.534 u[IU]/mL (ref 0.350–4.500)

## 2021-04-07 LAB — BETA-HYDROXYBUTYRIC ACID
Beta-Hydroxybutyric Acid: 5.46 mmol/L — ABNORMAL HIGH (ref 0.05–0.27)
Beta-Hydroxybutyric Acid: 1.9 mmol/L — ABNORMAL HIGH (ref 0.05–0.27)

## 2021-04-07 LAB — C-REACTIVE PROTEIN: CRP: 3.7 mg/dL — ABNORMAL HIGH (ref <1.0)

## 2021-04-07 LAB — AMMONIA: Ammonia: 9 umol/L (ref 9–35)

## 2021-04-07 MED ORDER — ACETAMINOPHEN 325 MG PO TABS
650.0000 mg | ORAL_TABLET | Freq: Four times a day (QID) | ORAL | Status: DC | PRN
  Administered 2021-04-08: 650 mg via ORAL
  Filled 2021-04-07: qty 2

## 2021-04-07 MED ORDER — LACTATED RINGERS IV SOLN: INTRAVENOUS | Status: AC

## 2021-04-07 MED ORDER — INSULIN GLARGINE-YFGN 100 UNIT/ML ~~LOC~~ SOLN
20.0000 [IU] | Freq: Every day | SUBCUTANEOUS | Status: DC
  Administered 2021-04-07: 20 [IU] via SUBCUTANEOUS
  Filled 2021-04-07 (×2): qty 0.2

## 2021-04-07 MED ORDER — INSULIN REGULAR(HUMAN) IN NACL 100-0.9 UT/100ML-% IV SOLN
INTRAVENOUS | Status: DC
  Administered 2021-04-07: 1.3 [IU]/h via INTRAVENOUS

## 2021-04-07 MED ORDER — APIXABAN 5 MG PO TABS
5.0000 mg | ORAL_TABLET | Freq: Two times a day (BID) | ORAL | Status: DC
  Administered 2021-04-07 – 2021-04-13 (×12): 5 mg via ORAL
  Filled 2021-04-07 (×12): qty 1

## 2021-04-07 MED ORDER — INSULIN ASPART 100 UNIT/ML IJ SOLN: 0.0000 [IU] | Freq: Every day | INTRAMUSCULAR | Status: DC

## 2021-04-07 MED ORDER — DEXTROSE IN LACTATED RINGERS 5 % IV SOLN: INTRAVENOUS | Status: DC

## 2021-04-07 MED ORDER — CARVEDILOL 12.5 MG PO TABS
25.0000 mg | ORAL_TABLET | Freq: Two times a day (BID) | ORAL | Status: DC
  Administered 2021-04-08 – 2021-04-13 (×10): 25 mg via ORAL
  Filled 2021-04-07 (×11): qty 2

## 2021-04-07 MED ORDER — SIMVASTATIN 20 MG PO TABS
10.0000 mg | ORAL_TABLET | Freq: Every day | ORAL | Status: DC
  Administered 2021-04-08 – 2021-04-13 (×6): 10 mg via ORAL
  Filled 2021-04-07 (×7): qty 1

## 2021-04-07 MED ORDER — LISINOPRIL 10 MG PO TABS
10.0000 mg | ORAL_TABLET | Freq: Every day | ORAL | Status: DC
  Administered 2021-04-08 – 2021-04-13 (×6): 10 mg via ORAL
  Filled 2021-04-07 (×7): qty 1

## 2021-04-07 MED ORDER — ONDANSETRON HCL 4 MG/2ML IJ SOLN: 4.0000 mg | Freq: Four times a day (QID) | INTRAMUSCULAR | Status: DC | PRN

## 2021-04-07 MED ORDER — PANTOPRAZOLE SODIUM 40 MG PO TBEC
40.0000 mg | DELAYED_RELEASE_TABLET | Freq: Every day | ORAL | Status: DC
  Administered 2021-04-08 – 2021-04-13 (×6): 40 mg via ORAL
  Filled 2021-04-07 (×6): qty 1

## 2021-04-07 MED ORDER — ENOXAPARIN SODIUM 40 MG/0.4ML IJ SOSY: 40.0000 mg | PREFILLED_SYRINGE | Freq: Every day | INTRAMUSCULAR | Status: DC

## 2021-04-07 MED ORDER — METOPROLOL TARTRATE 5 MG/5ML IV SOLN
2.5000 mg | Freq: Once | INTRAVENOUS | Status: AC
  Administered 2021-04-07: 2.5 mg via INTRAVENOUS
  Filled 2021-04-07: qty 5

## 2021-04-07 MED ORDER — ACETAMINOPHEN 650 MG RE SUPP: 650.0000 mg | Freq: Four times a day (QID) | RECTAL | Status: DC | PRN

## 2021-04-07 MED ORDER — LACTATED RINGERS IV SOLN: INTRAVENOUS | Status: DC

## 2021-04-07 MED ORDER — SERTRALINE HCL 50 MG PO TABS
25.0000 mg | ORAL_TABLET | Freq: Every day | ORAL | Status: DC
  Administered 2021-04-08 – 2021-04-13 (×6): 25 mg via ORAL
  Filled 2021-04-07 (×6): qty 1

## 2021-04-07 MED ORDER — INSULIN ASPART 100 UNIT/ML IJ SOLN
0.0000 [IU] | Freq: Three times a day (TID) | INTRAMUSCULAR | Status: DC
  Administered 2021-04-08: 5 [IU] via SUBCUTANEOUS

## 2021-04-07 MED ORDER — ALBUTEROL SULFATE (2.5 MG/3ML) 0.083% IN NEBU: 2.5000 mg | INHALATION_SOLUTION | Freq: Four times a day (QID) | RESPIRATORY_TRACT | Status: DC | PRN

## 2021-04-07 MED ORDER — IOHEXOL 300 MG/ML  SOLN
70.0000 mL | Freq: Once | INTRAMUSCULAR | Status: AC | PRN
  Administered 2021-04-07: 70 mL via INTRAVENOUS

## 2021-04-07 MED ORDER — FLUTICASONE FUROATE-VILANTEROL 200-25 MCG/INH IN AEPB
1.0000 | INHALATION_SPRAY | Freq: Every day | RESPIRATORY_TRACT | Status: DC
  Administered 2021-04-10 – 2021-04-13 (×4): 1 via RESPIRATORY_TRACT
  Filled 2021-04-07: qty 28

## 2021-04-07 MED ORDER — ONDANSETRON HCL 4 MG PO TABS: 4.0000 mg | ORAL_TABLET | Freq: Four times a day (QID) | ORAL | Status: DC | PRN

## 2021-04-07 MED ORDER — DEXTROSE 50 % IV SOLN: 0.0000 mL | INTRAVENOUS | Status: DC | PRN

## 2021-04-07 NOTE — ED Notes (Signed)
This RN attempted to call report to [id number], was told "We don't know who is getting that pt."

## 2021-04-07 NOTE — H&P (Signed)
History and Physical    Brian Martin [id number] DOB: [birth year] DOA: 04/06/2021  PCP: Marco Collie, MD  Patient coming from: Home via EMS   Chief Complaint:  Chief Complaint  Patient presents with   Altered Mental Status    EMS arrival. AMS/hyperglycemic. Pt arrives from home, lives w/ wife. Aox3. Left BKA. BG 518 per EMS.      HPI:    65 year old male with past medical history of hyperlipidemia, insulin-dependent diabetes mellitus type 2, paroxysmal atrial fibrillation, left BKA, chronic kidney disease stage IIIa, hypertension, diastolic congestive heart failure (Echo 04/2015 EF 55% with G1DD) who presents to [facility] emergency department via EMS due to confusion.  Patient is unable to provide history due to lethargy and confusion and therefore the majority history is been obtained from the wife via phone conversation.  Like explains that for the past several weeks patient has been exhibiting increasingly strange behaviors.  Patient seemed to be increasingly forgetful about items around the house.  Then rather suddenly the patient began to forget how to drive his vehicle.  Then, on 2 occasions over the past several days the patient broke into the house via various windows despite having keys to the house in his pocket.  The wife also reports the patient has become increasingly weak and "wobbly when standing."  Wife also reports that patient's "bladder and bowels have been out of control" however she is unable to provide further detail.  Patient has exhibited poor oral intake over the same span of time.  Patient has not exhibited any evidence of abdominal pain or vomiting.  Wife denies the patient uses any illicit drugs, has started a new prescription medications or drinks alcohol  Due to patient's progressively worsening confusion and bizarre behavior she eventually contacted EMS who promptly came to evaluate the patient.  On EMS evaluation they found the patient's blood  sugar was 518.  EMS then brought him into [facility] emergency department for evaluation.  Upon evaluation in the emergency department CT imaging of the head was negative for any acute disease only revealing an old left basal ganglia infarct.  Ammonia level was found to be 9.  Blood sugars continue to be markedly elevated and patient was additionally found to have an elevated anion gap with elevated beta hydroxybutyrate of 5.46 all suggestive of diabetic ketoacidosis.  Patient was also found to have substantial leukocytosis of 24.6.  Patient was initiated on intravenous fluids as well as a insulin infusion.  The hospitalist group was then called to assess the patient for admission the hospital.  Review of Systems:   Review of Systems  Unable to perform ROS: Mental acuity   Past Medical History:  Diagnosis Date   Amputation of leg ([place])    Atrial fibrillation (HCC)    CHF (congestive heart failure) ([place])    chronic systolic CHF   Chronic renal failure    Diabetes mellitus    type 2   Hypertension     Past Surgical History:  Procedure Laterality Date   BELOW KNEE LEG AMPUTATION  06/29/2010   Left    COLONOSCOPY N/A 06/15/2013   Procedure: COLONOSCOPY;  Surgeon: Lear Ng, MD;  Location: [place];  Service: Endoscopy;  Laterality: N/A;   ESOPHAGOGASTRODUODENOSCOPY N/A 06/14/2013   Procedure: ESOPHAGOGASTRODUODENOSCOPY (EGD);  Surgeon: Lear Ng, MD;  Location: [facility] ENDOSCOPY;  Service: Endoscopy;  Laterality: N/A;     reports that he quit smoking about 19 years ago. His  smoking use included cigarettes. He has never used smokeless tobacco. He reports current drug use. Drug: Marijuana. He reports that he does not drink alcohol.  No Known Allergies  Family History  Problem Relation Age of Onset   Diabetes Other      Prior to Admission medications   Medication Sig Start Date End Date Taking? Authorizing Provider  albuterol (PROVENTIL) (2.5 MG/3ML)  0.083% nebulizer solution Take 2.5 mg by nebulization every 6 (six) hours as needed for wheezing or shortness of breath.    [provider]  apixaban (ELIQUIS) 5 MG TABS tablet Take 1 tablet (5 mg total) by mouth 2 (two) times daily. NEEDS APPOINTMENT FOR FUTURE REFILLS 10/25/20   Bensimhon, Shaune Pascal, MD  Ascorbic Acid (VITAMIN C) 100 MG tablet Take 100 mg by mouth daily.    [provider]  budesonide-formoterol (SYMBICORT) 160-4.5 MCG/ACT inhaler Inhale 2 puffs into the lungs 2 (two) times daily.    [provider]  carvedilol (COREG) 25 MG tablet Take 25 mg by mouth 2 (two) times daily with a meal.    [provider]  docusate sodium (COLACE) 100 MG capsule Take 1 capsule (100 mg total) by mouth 2 (two) times daily. 02/21/16   Kelvin Cellar, MD  Exenatide (BYDUREON [place]) Inject 2 mg into the skin once a week.    [provider]  furosemide (LASIX) 20 MG tablet Take 1 tablet (20 mg total) by mouth daily. 07/03/14   Rande Brunt, CRNA  insulin glargine (LANTUS) 100 UNIT/ML injection Inject 0.2 mLs (20 Units total) into the skin at bedtime. 02/21/16   Kelvin Cellar, MD  lisinopril (PRINIVIL,ZESTRIL) 10 MG tablet Take 10 mg by mouth daily.    [provider]  pantoprazole (PROTONIX) 40 MG tablet Take 1 tablet (40 mg total) by mouth daily. 06/16/13   Ivor Costa, MD  sertraline (ZOLOFT) 25 MG tablet Take 25 mg by mouth daily.    [provider]  simvastatin (ZOCOR) 10 MG tablet Take 10 mg by mouth daily.    [provider]    Physical Exam: Vitals:   04/06/21 1956 04/07/21 0241 04/07/21 0445  BP: (!) 163/125 134/90 (!) 158/98  Pulse: 100 94 97  Resp: '16 16 14  '$ Temp: 98 F (36.7 C)    SpO2: 100% 98% 96%    Constitutional: Lethargic and minimally arousable.  Patient is disoriented.  Patient is not in any apparent distress. Skin: no rashes, no lesions, notable poor skin turgor. Eyes: Pupils are equally reactive to light.   No evidence of scleral icterus or conjunctival pallor.  ENMT: Dry mucous membranes noted.  Posterior pharynx clear of any exudate or lesions.   Neck: normal, supple, no masses, no thyromegaly.  No evidence of jugular venous distension.   Respiratory: clear to auscultation bilaterally, no wheezing, no crackles. Normal respiratory effort. No accessory muscle use.  Cardiovascular: Regular rate and rhythm, no murmurs / rubs / gallops. No extremity edema. 2+ pedal pulses. No carotid bruits.  Chest:   Nontender without crepitus or deformity.   Back:   Nontender without crepitus or deformity. Abdomen: Abdomen is soft and nontender.  No evidence of intra-abdominal masses.  Positive bowel sounds noted in all quadrants.   Musculoskeletal: Evidence of left BKA.  Otherwise, no joint deformity upper and lower extremities. Good ROM, no contractures. Normal muscle tone.  Neurologic: Patient is extremely lethargic and minimally arousable.  Patient is currently not following commands.  Sensation is grossly intact.  Patient does localize to painful stimuli.   Psychiatric: Unable to assess due to severe lethargy.  Patient currently does not seem to possess insight as to his current situation.   Labs on Admission: I have personally reviewed following labs and imaging studies -   CBC: Recent Labs  Lab 04/06/21 2008 04/06/21 2243  WBC 24.6*  --   NEUTROABS 22.0*  --   HGB 17.9* 17.7*  HCT 54.9* 52.0  MCV 98.2  --   PLT 280  --    Basic Metabolic Panel: Recent Labs  Lab 04/06/21 2008 04/06/21 2243 04/07/21 0310  NA 138 142 141  K 4.1 4.4 3.8  CL 105  --  109  CO2 14*  --  18*  GLUCOSE 448*  --  221*  BUN 35*  --  31*  CREATININE 1.96*  --  1.56*  CALCIUM 8.9  --  9.3   GFR: CrCl cannot be calculated (Unknown ideal weight.). Liver Function Tests: Recent Labs  Lab 04/06/21 2008  AST 34  ALT 17  ALKPHOS 90  BILITOT 1.5*  PROT 7.5  ALBUMIN 3.8   No results for input(s): LIPASE, AMYLASE  in the last 168 hours. Recent Labs  Lab 04/06/21 2348  AMMONIA 9   Coagulation Profile: No results for input(s): INR, PROTIME in the last 168 hours. Cardiac Enzymes: No results for input(s): CKTOTAL, CKMB, CKMBINDEX, TROPONINI in the last 168 hours. BNP (last 3 results) No results for input(s): PROBNP in the last 8760 hours. HbA1C: No results for input(s): HGBA1C in the last 72 hours. CBG: Recent Labs  Lab 04/06/21 2000 04/07/21 0037 04/07/21 0424 04/07/21 0509  GLUCAP 454* 389* 96 120*   Lipid Profile: No results for input(s): CHOL, HDL, LDLCALC, TRIG, CHOLHDL, LDLDIRECT in the last 72 hours. Thyroid Function Tests: No results for input(s): TSH, T4TOTAL, FREET4, T3FREE, THYROIDAB in the last 72 hours. Anemia Panel: No results for input(s): VITAMINB12, FOLATE, FERRITIN, TIBC, IRON, RETICCTPCT in the last 72 hours. Urine analysis:    Component Value Date/Time   COLORURINE YELLOW 04/07/2021 0000   APPEARANCEUR HAZY (A) 04/07/2021 0000   LABSPEC 1.028 04/07/2021 0000   PHURINE 5.0 04/07/2021 0000   GLUCOSEU >=500 (A) 04/07/2021 0000   HGBUR MODERATE (A) 04/07/2021 0000   BILIRUBINUR NEGATIVE 04/07/2021 0000   KETONESUR 80 (A) 04/07/2021 0000   PROTEINUR 100 (A) 04/07/2021 0000   UROBILINOGEN 0.2 06/30/2010 1037   NITRITE NEGATIVE 04/07/2021 0000   LEUKOCYTESUR NEGATIVE 04/07/2021 0000    Radiological Exams on Admission - Personally Reviewed: DG Chest 2 View  Result Date: 04/06/2021 CLINICAL DATA:  Altered mental status EXAM: CHEST - 2 VIEW COMPARISON:  09/22/2016 FINDINGS: Mild bronchitic changes. No focal consolidation or effusion. Normal cardiac size with aortic atherosclerosis. No pneumothorax. IMPRESSION: No active cardiopulmonary disease.  Bronchitic changes Electronically Signed   By: Donavan Foil M.D.   On: 04/06/2021 21:24   CT HEAD WO CONTRAST (5MM)  Result Date: 04/06/2021 CLINICAL DATA:  Altered mental status EXAM: CT HEAD WITHOUT CONTRAST TECHNIQUE:  Contiguous axial images were obtained from the base of the skull through the vertex without intravenous contrast. COMPARISON:  None. FINDINGS: Brain: No evidence of acute infarction, hemorrhage, hydrocephalus, extra-axial collection or mass lesion/mass effect. Left basal ganglia lacunar infarct (series 3/image 20), likely chronic. Subcortical white matter and periventricular small vessel ischemic changes. Vascular: Intracranial atherosclerosis. Skull: Normal. Negative for fracture or focal lesion. Sinuses/Orbits: The visualized paranasal sinuses are essentially clear. The mastoid air cells are  unopacified. Other: None. IMPRESSION: No evidence of acute intracranial abnormality. Old left basal ganglia lacunar infarct. Small vessel ischemic changes. Electronically Signed   By: Julian Hy M.D.   On: 04/06/2021 22:52    EKG: Personally reviewed.  Rhythm is supraventricular tachycardia with heart rate of 142 bpm.  No dynamic ST segment changes appreciated.  Assessment/Plan Principal Problem:   Acute metabolic encephalopathy  Patient exhibiting a several week history of progressively worsening bizarre behaviors and confusion CT head unremarkable Initial work-up for infection including chest x-ray and urinalysis negative.  COVID-19 testing negative. Ammonia unremarkable Continue TSH, vitamin B12, folate, urine toxicology screen, CRP If encephalopathy does not spontaneously resolve with intravenous hydration and otherwise conservative measures will need to expand work-up including possible MRI and EEG as well as possible neurology consultation Avoiding sedating agents  Active Problems:  Diabetic ketoacidosis without coma associated with type 2 diabetes mellitus ([place])  Patient presenting with elevated beta hydroxybutyrate and elevated anion gap with severe hyperglycemia all consistent with acute diabetic ketoacidosis Likely caused by noncompliance with home hypoglycemic regimen Initiating insulin  infusion with frequent Accu-Cheks Hydrating patient with intravenous isotonic fluids Frequent chemistries and beta hydroxybutyrate levels Currently n.p.o. We will transition patient off of insulin infusion and restart diet once gap is closed    Essential hypertension  Continue home regimen of antihypertensive therapy    AF (paroxysmal atrial fibrillation) ([place])  Patient has a longstanding known history of paroxysmal atrial fibrillation Patient apparently had a short stent of rapid A. fib or SVT which was treated with a dose of metoprolol earlier in the evening Continue home regimen of carvedilol Continue home regimen of apixaban Monitoring patient on telemetry Monitoring electrolytes    Chronic diastolic CHF (congestive heart failure) (HCC)  No evidence of acute cardiogenic volume overload       Leukocytosis  Substantial leukocytosis without fever CRP and procalcitonin pending Chest x-ray and urinalysis unremarkable Neck supple, no clinical evidence of meningitis Blood cultures pending    Mixed hyperlipidemia  Continue home regimen of simvastatin    Chronic kidney disease, stage 3a (HCC)  Slightly elevated creatinine beyond baseline Hydrating patient with intravenous isotonic fluids Strict input and output monitoring Monitoring renal function and electrolytes with cell chemistries Avoiding nephrotoxic agents if at all possible  Code Status:  Full code Family Communication: Plan of care discussed with the wife via phone conversation.  Status is: Inpatient  Remains inpatient appropriate because:Ongoing diagnostic testing needed not appropriate for outpatient work up, IV treatments appropriate due to intensity of illness or inability to take PO, and Inpatient level of care appropriate due to severity of illness  Dispo: The patient is from: Home              Anticipated d/c is to: Home              Patient currently is not medically stable to d/c.   Difficult to  place patient No        Vernelle Emerald MD [facility] Pager [phone]  If 7PM-7AM, please contact night-coverage [email or web address] Use universal [place] password for that web site. If you do not have the password, please call the hospital operator.  04/07/2021, 5:43 AM

## 2021-04-07 NOTE — Progress Notes (Signed)
Inpatient Diabetes Program Recommendations  AACE/ADA: New Consensus Statement on Inpatient Glycemic Control (2015)  Target Ranges:  Prepandial:   less than 140 mg/dL      Peak postprandial:   less than 180 mg/dL (1-2 hours)      Critically ill patients:  140 - 180 mg/dL   Lab Results  Component Value Date   GLUCAP 177 (H) 04/07/2021   HGBA1C 12.9 (H) 04/06/2021    Review of Glycemic Control  Diabetes history: DM2 Outpatient Diabetes medications: Not taking Lantus 20 units nor Jardiance due to cost Current orders for Inpatient glycemic control: IV insulin  Inpatient Diabetes Program Recommendations:   Spoke with patient's wife via phone. Shared that patient stopped taking his Lantus but not sure why. Patient had started on Jardiance also but has not been taking due to cost. On discharge, patient may benefit by transitioning to Novolin Relion insulin from [facility] 70/30 insulin bid on discharge. Consult placed to transition of care for review of medication needs. Has not been taking other medications due to cost including Eliquis.  Thank you, Nani Gasser. Cashius Grandstaff, RN, MSN, CDE  Diabetes Coordinator Inpatient Glycemic Control Team Team Pager [phone] (8am-5pm) 04/07/2021 11:15 AM

## 2021-04-07 NOTE — ED Notes (Signed)
Pt was given peri care, repositioned in bed, clean sheets on the bed & a clean brief.

## 2021-04-07 NOTE — Progress Notes (Signed)
Same day note  Patient seen and examined at bedside.  Patient was admitted to the hospital for altered mental status  At the time of my evaluation, patient complains of nonspecific symptoms of, unaware why he is here in the hospital.  Appears to be confused.  Physical examination reveals average built male, on upper extremity mittens, confused and disoriented, left BKA.  Laboratory data and imaging was reviewed  Assessment and Plan.  Acute metabolic encephalopathy likely secondary to hyperglycemia.  Continue to treat underlying causes.  TSH is within normal limits.  Folic acid low.  HIV was negative.  Urine drug screen was positive for tetrahydrocannabinol.  Blood cultures negative in less than 12 hours.  Lactate was slightly elevated.  COVID-19 and influenza negative.  Ammonia was 9.  UA showed hemoglobin and ketones but nitrite was negative.  Diabetic ketoacidosis history of diabetes mellitus type 2.  Continue with insulin drip and closely monitor.  Hemoglobin A1c of 12.9.  Will transition to long-acting insulin after DKA resolved.  History of congestive heart failure.  Compensated at this time.  Paroxysmal atrial fibrillation.  Known ejection fraction of 55% with grade 1 diastolic dysfunction in [id number].  Patient had short run of rapid A. fib.  Currently on Coreg and Eliquis.  CKD stage IIIa.  We will monitor renal function.  Check BMP in AM.  Hypertension.  We will continue to monitor blood pressure.  On Coreg.  We will continue with that.  Left BKA Continue supportive care  Leukocytosis.   Could be reactive.  We will continue to monitor.  No Charge  Signed,  Delila Pereyra, MD [facility]

## 2021-04-08 LAB — BLOOD CULTURE ID PANEL (REFLEXED) - BCID2

## 2021-04-08 LAB — CBC
HCT: 53.1 % — ABNORMAL HIGH (ref 39.0–52.0)
Hemoglobin: 17.6 g/dL — ABNORMAL HIGH (ref 13.0–17.0)
MCH: 31.7 pg (ref 26.0–34.0)
MCHC: 33.1 g/dL (ref 30.0–36.0)
MCV: 95.5 fL (ref 80.0–100.0)
Platelets: 229 10*3/uL (ref 150–400)
RBC: 5.56 MIL/uL (ref 4.22–5.81)
RDW: 13.4 % (ref 11.5–15.5)
WBC: 18.4 10*3/uL — ABNORMAL HIGH (ref 4.0–10.5)
nRBC: 0 % (ref 0.0–0.2)

## 2021-04-08 LAB — MAGNESIUM: Magnesium: 1.8 mg/dL (ref 1.7–2.4)

## 2021-04-08 LAB — BASIC METABOLIC PANEL
Anion gap: 8 (ref 5–15)
BUN: 19 mg/dL (ref 8–23)
CO2: 25 mmol/L (ref 22–32)
Calcium: 8.8 mg/dL — ABNORMAL LOW (ref 8.9–10.3)
Chloride: 108 mmol/L (ref 98–111)
Creatinine, Ser: 1.21 mg/dL (ref 0.61–1.24)
GFR, Estimated: >60 mL/min (ref >60)
Glucose, Bld: 251 mg/dL — ABNORMAL HIGH (ref 70–99)
Potassium: 3.6 mmol/L (ref 3.5–5.1)
Sodium: 141 mmol/L (ref 135–145)

## 2021-04-08 LAB — GLUCOSE, CAPILLARY
Glucose-Capillary: 238 mg/dL — ABNORMAL HIGH (ref 70–99)
Glucose-Capillary: 286 mg/dL — ABNORMAL HIGH (ref 70–99)
Glucose-Capillary: 313 mg/dL — ABNORMAL HIGH (ref 70–99)
Glucose-Capillary: 179 mg/dL — ABNORMAL HIGH (ref 70–99)
Glucose-Capillary: 114 mg/dL — ABNORMAL HIGH (ref 70–99)

## 2021-04-08 LAB — PHOSPHORUS: Phosphorus: 2.4 mg/dL — ABNORMAL LOW (ref 2.5–4.6)

## 2021-04-08 LAB — PROTIME-INR
INR: 1.3 — ABNORMAL HIGH (ref 0.8–1.2)
Prothrombin Time: 16 seconds — ABNORMAL HIGH (ref 11.4–15.2)

## 2021-04-08 MED ORDER — INSULIN ASPART 100 UNIT/ML IJ SOLN
3.0000 [IU] | Freq: Three times a day (TID) | INTRAMUSCULAR | Status: DC
  Administered 2021-04-08 – 2021-04-13 (×13): 3 [IU] via SUBCUTANEOUS

## 2021-04-08 MED ORDER — K PHOS MONO-SOD PHOS DI & MONO 155-852-130 MG PO TABS
250.0000 mg | ORAL_TABLET | Freq: Three times a day (TID) | ORAL | Status: AC
  Administered 2021-04-08 – 2021-04-09 (×3): 250 mg via ORAL
  Filled 2021-04-08 (×3): qty 1

## 2021-04-08 MED ORDER — INSULIN ASPART 100 UNIT/ML IJ SOLN
0.0000 [IU] | Freq: Three times a day (TID) | INTRAMUSCULAR | Status: DC
  Administered 2021-04-08: 11 [IU] via SUBCUTANEOUS
  Administered 2021-04-08: 3 [IU] via SUBCUTANEOUS
  Administered 2021-04-09: 2 [IU] via SUBCUTANEOUS
  Administered 2021-04-09: 5 [IU] via SUBCUTANEOUS
  Administered 2021-04-10 – 2021-04-11 (×4): 3 [IU] via SUBCUTANEOUS
  Administered 2021-04-11 (×2): 2 [IU] via SUBCUTANEOUS
  Administered 2021-04-12: 8 [IU] via SUBCUTANEOUS
  Administered 2021-04-12: 2 [IU] via SUBCUTANEOUS
  Administered 2021-04-13: 5 [IU] via SUBCUTANEOUS
  Administered 2021-04-13: 3 [IU] via SUBCUTANEOUS

## 2021-04-08 MED ORDER — POTASSIUM CHLORIDE CRYS ER 20 MEQ PO TBCR
40.0000 meq | EXTENDED_RELEASE_TABLET | Freq: Once | ORAL | Status: AC
  Administered 2021-04-08: 40 meq via ORAL
  Filled 2021-04-08: qty 2

## 2021-04-08 MED ORDER — INSULIN ASPART 100 UNIT/ML IJ SOLN
0.0000 [IU] | Freq: Every day | INTRAMUSCULAR | Status: DC
  Administered 2021-04-08: 5 [IU] via SUBCUTANEOUS
  Administered 2021-04-12: 3 [IU] via SUBCUTANEOUS

## 2021-04-08 MED ORDER — INSULIN GLARGINE-YFGN 100 UNIT/ML ~~LOC~~ SOLN
25.0000 [IU] | Freq: Every day | SUBCUTANEOUS | Status: DC
  Administered 2021-04-08 – 2021-04-12 (×5): 25 [IU] via SUBCUTANEOUS
  Filled 2021-04-08 (×7): qty 0.25

## 2021-04-08 NOTE — TOC Initial Note (Addendum)
Transition of Care [facility]) - Initial/Assessment Note    Patient Details  Name: Brian Martin MRN: [id number] Date of Birth: [birth year]  Transition of [facility] CM/SW Contact:    Pollie Friar, RN Phone Number: 04/08/2021, 2:09 PM  Clinical Narrative:                 CM met with the patient at the bedside. He lives with his spouse that works during the day. He has a walker at home.  Pt states he still drives. Wife does his medications.  CM consulted for medication assistance: CM called his primary pharmacy and they state most of his meds are a $0 co pay.  Eliquis $141/ mo Jardiance: $152/ mo Lantus: $105/ mo  CM called his medication insurance, [employer] [facility]: [phone] They state that Xarelto: $47/ mo Levemir would be same as Lantus Attempted to get other prices on meds similar to Jardiance but they hung up.   Pt states he can not afford the cost of the three meds above. He will need different medications at d/c that he can afford. Attempted to talk with wife over the phone but had to leave voicemail (with pt permission). TOC following.  Expected Discharge Plan: Home/Self Care Barriers to Discharge: Continued Medical Work up   Patient Goals and CMS Choice        Expected Discharge Plan and Services Expected Discharge Plan: Home/Self Care   Discharge Planning Services: CM Consult   Living arrangements for the past 2 months: Single Family Home                                      Prior Living Arrangements/Services Living arrangements for the past 2 months: Single Family Home Lives with:: Spouse Patient language and need for interpreter reviewed:: Yes Do you feel safe going back to the place where you live?: Yes            Criminal Activity/Legal Involvement Pertinent to Current Situation/Hospitalization: No - Comment as needed  Activities of Daily Living      Permission Sought/Granted                  Emotional Assessment Appearance:: Appears  stated age Attitude/Demeanor/Rapport:  (slow to respond) Affect (typically observed): Flat Orientation: : Oriented to Self, Oriented to Place, Oriented to Situation   Psych Involvement: No (comment)  Admission diagnosis:  Encephalopathy [G93.40] Diabetic ketoacidosis without coma associated with type 2 diabetes mellitus ([place]) [M19.62] Acute metabolic encephalopathy [I29.79] Patient Active Problem List   Diagnosis Date Noted   Acute metabolic encephalopathy 89/21/1941   Diabetic ketoacidosis without coma associated with type 2 diabetes mellitus ([place]) 04/07/2021   Leukocytosis 04/07/2021   Mixed hyperlipidemia 04/07/2021   Cellulitis 02/18/2016   Chronic diastolic CHF (congestive heart failure) ([place]) 05/05/2015   Encounter for therapeutic drug monitoring 11/21/2013   Chronic anticoagulation 06/14/2013   GIB (gastrointestinal bleeding) 06/13/2013   Morbid obesity ([place]) 02/17/2013   Open wound of knee, leg (except thigh), and ankle, without mention of complication 74/03/1447   DIABETES MELLITUS, TYPE II 08/12/2010   CARDIOMYOPATHY, DILATED 08/12/2010   Chronic kidney disease, stage 3a ([place]) 08/12/2010   BKA, LEFT LEG 08/12/2010   Essential hypertension 08/09/2010   AF (paroxysmal atrial fibrillation) ([place]) 08/09/2010   PCP:  Marco Collie, MD Pharmacy:   [place] [place] [address]  [place] [address] Phone: [phone] Fax: [phone]     Social Determinants of Health (SDOH) Interventions    Readmission Risk Interventions No flowsheet data found.

## 2021-04-08 NOTE — Progress Notes (Signed)
PROGRESS NOTE  Brian Martin [id number] DOB: 1956/06/30 DOA: 04/06/2021 PCP: Marco Collie, MD   LOS: 1 day   Brief narrative: Patient is a 65 years old male with past medical history of diabetes mellitus type 2, atrial fibrillation, history of peripheral vascular disease status post left BKA, CKD stage IIIa, congestive heart failure, presented to the hospital with confusion lethargy for several weeks with the strange behaviors and forgetfulness.  He was also progressively weak and wobbly.  He also had episodes of incontinence and poor oral intake.  Due to patient's worsening confusion and bizarre behavior patient was brought into the hospital.  His blood glucose levels were elevated at 518.  In the ED CT head scan was done which was negative for acute findings except for old left basal ganglia infarct.  Ammonia was 9.  Blood sugars were elevated with elevated anion gap suggestive of DKA.  Patient also was noted to have significant leukocytosis.  Patient was started on IV fluids insulin drip and was admitted to hospital for further evaluation and treatment..    Assessment/Plan:  Principal Problem:   Acute metabolic encephalopathy Active Problems:   Essential hypertension   AF (paroxysmal atrial fibrillation) (HCC)   Chronic kidney disease, stage 3a (HCC)   Chronic diastolic CHF (congestive heart failure) (HCC)   Diabetic ketoacidosis without coma associated with type 2 diabetes mellitus ([place])   Leukocytosis   Mixed hyperlipidemia  Acute metabolic encephalopathy likely secondary to uncontrolled hyperglycemia and DKA.  Blood glucose levels have improved with insulin drip and IV fluids.  TSH is within normal limits.  Folic acid low.  HIV was negative.  Urine drug screen was positive for tetrahydrocannabinol.  Blood cultures negative in less than 12 hours. lactate was slightly elevated secondary to volume depletion .  COVID-19 and influenza negative.  Ammonia was 9.  UA showed hemoglobin and  ketones but nitrite was negative.  Improved today.   Diabetic ketoacidosis history of diabetes mellitus type 2.   Hemoglobin A1c of 12.9.  Has been transitioned to long-acting insulin and sliding scale insulin.  We will closely monitor.  Diabetic coordinator on board.  Recommending adjustment in insulin.  Patient was emphasized the need for compliance with insulin regimen.  He stated that he was missing insulin doses  History of congestive heart failure.    Received IV fluids and appears compensated.  We will closely monitor   Paroxysmal atrial fibrillation.  Known ejection fraction of 55% with grade 1 diastolic dysfunction in [id number].  Patient had short run of rapid A. fib.  Currently on Coreg and Eliquis.  Currently rate controlled.  Mild hypokalemia yesterday.  Check BMP this morning.  Mild hypophosphatemia.  We will replenish.  CKD stage IIIa.  Latest creatinine of 1.0.  We will continue to monitor closely.   Hypertension.  Continue Coreg.  Discontinue IV fluids.  Left BKA Continue supportive care   Leukocytosis.   Could be reactive.  CBC has trended down.  No obvious source of infection so far.  DVT prophylaxis:  apixaban (ELIQUIS) tablet 5 mg   Code Status: Full code  Family Communication: None  Status is: Inpatient  Remains inpatient appropriate because:IV treatments appropriate due to intensity of illness or inability to take PO and Inpatient level of care appropriate due to severity of illness  Dispo: The patient is from: Home              Anticipated d/c is to: Skilled nursing facility placement as per PT  evaluation.              Patient currently is not medically stable to d/c.   Difficult to place patient No   Consultants: None  Procedures: None  Anti-infectives:  None  Anti-infectives (From admission, onward)    None      Subjective: Today, patient was seen and examined at bedside.  Patient appears to be more alert awake and communicative.  States  that he was missing insulin at home.  Denies any pain.  Does not know why he came to hospital.  Objective: Vitals:   04/08/21 0415 04/08/21 0751  BP: (!) 165/93 (!) 165/93  Pulse: 93 82  Resp: 20 18  Temp: 98.8 F (37.1 C) 98.5 F (36.9 C)  SpO2: 98% 99%    Intake/Output Summary (Last 24 hours) at 04/08/2021 0913 Last data filed at 04/08/2021 0600 Gross per 24 hour  Intake 634.13 ml  Output 600 ml  Net 34.13 ml   There were no vitals filed for this visit. There is no height or weight on file to calculate BMI.   Physical Exam: GENERAL: Patient is alert awake and oriented to place.  Not in obvious distress. HENT: No scleral pallor or icterus. Pupils equally reactive to light. Oral mucosa is moist NECK: is supple, no gross swelling noted. CHEST: Clear to auscultation. No crackles or wheezes.  Diminished breath sounds bilaterally. CVS: S1 and S2 heard, no murmur. Regular rate and rhythm.  ABDOMEN: Soft, non-tender, bowel sounds are present. EXTREMITIES: Left BKA CNS: Cranial nerves are intact. No focal motor deficits. SKIN: warm and dry without rashes.  Data Review: I have personally reviewed the following laboratory data and studies,  CBC: Recent Labs  Lab 04/06/21 2008 04/06/21 2243 04/08/21 0327  WBC 24.6*  --  18.4*  NEUTROABS 22.0*  --   --   HGB 17.9* 17.7* 17.6*  HCT 54.9* 52.0 53.1*  MCV 98.2  --  95.5  PLT 280  --  [id number]   Basic Metabolic Panel: Recent Labs  Lab 04/07/21 0310 04/07/21 0910 04/07/21 1504 04/07/21 1739 04/07/21 2118 04/08/21 0327  NA 141 143 143 144 144  --   K 3.8 4.6 3.9 4.3 3.2*  --   CL 109 114* 112* 113* 114*  --   CO2 18* 16* '22 22 23  '$ --   GLUCOSE 221* 183* 177* 151* 157*  --   BUN 31* 30* 25* 24* 22  --   CREATININE 1.56* 1.34* 1.25* 1.18 1.07  --   CALCIUM 9.3 9.2 9.3 9.2 8.8*  --   MG  --   --   --   --   --  1.8  PHOS  --   --   --   --   --  2.4*   Liver Function Tests: Recent Labs  Lab 04/06/21 2008  AST 34  ALT  17  ALKPHOS 90  BILITOT 1.5*  PROT 7.5  ALBUMIN 3.8   No results for input(s): LIPASE, AMYLASE in the last 168 hours. Recent Labs  Lab 04/06/21 2348  AMMONIA 9   Cardiac Enzymes: No results for input(s): CKTOTAL, CKMB, CKMBINDEX, TROPONINI in the last 168 hours. BNP (last 3 results) No results for input(s): BNP in the last 8760 hours.  ProBNP (last 3 results) No results for input(s): PROBNP in the last 8760 hours.  CBG: Recent Labs  Lab 04/07/21 2025 04/07/21 2135 04/07/21 2228 04/08/21 0142 04/08/21 0618  GLUCAP 167* 165* 154* 238* 286*  Recent Results (from the past 240 hour(s))  Resp Panel by RT-PCR (Flu A&B, Covid)     Status: None   Collection Time: 04/07/21 12:01 AM   Specimen: Nasopharyngeal(NP) swabs in vial transport medium  Result Value Ref Range Status   SARS Coronavirus 2 by RT PCR NEGATIVE NEGATIVE Final    Comment: (NOTE) SARS-CoV-2 target nucleic acids are NOT DETECTED.  The SARS-CoV-2 RNA is generally detectable in upper respiratory specimens during the acute phase of infection. The lowest concentration of SARS-CoV-2 viral copies this assay can detect is 138 copies/mL. A negative result does not preclude SARS-Cov-2 infection and should not be used as the sole basis for treatment or other patient management decisions. A negative result may occur with  improper specimen collection/handling, submission of specimen other than nasopharyngeal swab, presence of viral mutation(s) within the areas targeted by this assay, and inadequate number of viral copies(<138 copies/mL). A negative result must be combined with clinical observations, patient history, and epidemiological information. The expected result is Negative.  Fact Sheet for Patients:  EntrepreneurPulse.com.au  Fact Sheet for Healthcare Providers:  IncredibleEmployment.be  This test is no t yet approved or cleared by the [place] FDA and  has been  authorized for detection and/or diagnosis of SARS-CoV-2 by FDA under an Emergency Use Authorization (EUA). This EUA will remain  in effect (meaning this test can be used) for the duration of the COVID-19 declaration under Section 564(b)(1) of the Act, 21 U.S.C.section 360bbb-3(b)(1), unless the authorization is terminated  or revoked sooner.       Influenza A by PCR NEGATIVE NEGATIVE Final   Influenza B by PCR NEGATIVE NEGATIVE Final    Comment: (NOTE) The Xpert Xpress SARS-CoV-2/FLU/RSV plus assay is intended as an aid in the diagnosis of influenza from Nasopharyngeal swab specimens and should not be used as a sole basis for treatment. Nasal washings and aspirates are unacceptable for Xpert Xpress SARS-CoV-2/FLU/RSV testing.  Fact Sheet for Patients: EntrepreneurPulse.com.au  Fact Sheet for Healthcare Providers: IncredibleEmployment.be  This test is not yet approved or cleared by the [place] FDA and has been authorized for detection and/or diagnosis of SARS-CoV-2 by FDA under an Emergency Use Authorization (EUA). This EUA will remain in effect (meaning this test can be used) for the duration of the COVID-19 declaration under Section 564(b)(1) of the Act, 21 U.S.C. section 360bbb-3(b)(1), unless the authorization is terminated or revoked.  Performed at [facility], [place] [address] [place], [place] [address]   Culture, blood (routine x 2)     Status: None (Preliminary result)   Collection Time: 04/07/21  3:10 AM   Specimen: BLOOD  Result Value Ref Range Status   Specimen Description BLOOD LEFT HAND  Final   Special Requests   Final    BOTTLES DRAWN AEROBIC AND ANAEROBIC Blood Culture adequate volume   Culture   Final    NO GROWTH < 12 HOURS Performed at [facility], [place] [address] [place], [place] [address]    Report Status PENDING  Incomplete  Culture, blood (Routine X 2) w Reflex to ID Panel     Status: None  (Preliminary result)   Collection Time: 04/08/21  3:30 AM   Specimen: BLOOD  Result Value Ref Range Status   Specimen Description BLOOD LEFT ANTECUBITAL  Final   Special Requests   Final    BOTTLES DRAWN AEROBIC ONLY Blood Culture adequate volume Performed at [facility], [place] [address], [place], [place]  [address]    Culture PENDING  Incomplete   Report Status PENDING  Incomplete     Studies: DG Chest 2 View  Result Date: 04/06/2021 CLINICAL DATA:  Altered mental status EXAM: CHEST - 2 VIEW COMPARISON:  09/22/2016 FINDINGS: Mild bronchitic changes. No focal consolidation or effusion. Normal cardiac size with aortic atherosclerosis. No pneumothorax. IMPRESSION: No active cardiopulmonary disease.  Bronchitic changes Electronically Signed   By: Donavan Foil M.D.   On: 04/06/2021 21:24   CT HEAD WO CONTRAST (5MM)  Result Date: 04/06/2021 CLINICAL DATA:  Altered mental status EXAM: CT HEAD WITHOUT CONTRAST TECHNIQUE: Contiguous axial images were obtained from the base of the skull through the vertex without intravenous contrast. COMPARISON:  None. FINDINGS: Brain: No evidence of acute infarction, hemorrhage, hydrocephalus, extra-axial collection or mass lesion/mass effect. Left basal ganglia lacunar infarct (series 3/image 20), likely chronic. Subcortical white matter and periventricular small vessel ischemic changes. Vascular: Intracranial atherosclerosis. Skull: Normal. Negative for fracture or focal lesion. Sinuses/Orbits: The visualized paranasal sinuses are essentially clear. The mastoid air cells are unopacified. Other: None. IMPRESSION: No evidence of acute intracranial abnormality. Old left basal ganglia lacunar infarct. Small vessel ischemic changes. Electronically Signed   By: Julian Hy M.D.   On: 04/06/2021 22:52   CT ABDOMEN PELVIS W CONTRAST  Result Date: 04/07/2021 CLINICAL DATA:  65 year old male with diarrhea. EXAM: CT ABDOMEN AND PELVIS WITH CONTRAST TECHNIQUE:  Multidetector CT imaging of the abdomen and pelvis was performed using the standard protocol following bolus administration of intravenous contrast. CONTRAST:  96m OMNIPAQUE IOHEXOL 300 MG/ML  SOLN COMPARISON:  None. FINDINGS: Lower chest: Calcified coronary artery atherosclerosis. No cardiomegaly. There is mild pericardial wall calcification on series 5, image 13. No pericardial effusion. Mild lung base atelectasis or scarring. Hepatobiliary: Small layering gallstones (series 3, image 33). No pericholecystic inflammation. Tiny 10 mm subcapsular hypodensity at the liver dome is likely a benign cyst. Small calcified granuloma also at the posterior dome. Otherwise negative liver. Pancreas: Atrophied. Spleen: Diminutive, negative. Adrenals/Urinary Tract: Normal adrenal glands. Nonobstructed kidneys. Renal vascular calcifications. No definite nephrolithiasis. Bilateral renal cortical thinning with patchy perinephric stranding which is likely chronic. Decompressed ureters. Mildly distended but otherwise unremarkable bladder. Stomach/Bowel: There is intermittent mild diverticulosis of the large bowel, including at the hepatic flexure. But no active large bowel inflammation is identified. Only mild retained stool. Normal appendix on series 3, image 70. Decompressed and negative terminal ileum. No dilated small bowel. Negative stomach and duodenum. No free air, free fluid, mesenteric inflammation. Vascular/Lymphatic: Extensive Aortoiliac calcified atherosclerosis. Suboptimal intravascular contrast bolus but the major arterial structures appear to be patent. Portal venous system appears patent. No lymphadenopathy. Reproductive: Negative. Other: No pelvic free fluid. Musculoskeletal: No acute osseous abnormality identified. A portion of the left upper extremity is included, and at the left antecubital fossa an oval 27 x 65 x 66 mm (AP by transverse by CC) collection of extravasated contrast is visible, for an estimated  volume of 58 mL. IMPRESSION: 1. No acute or inflammatory process identified in the abdomen or pelvis. Occasional large bowel diverticula without active inflammation. Cholelithiasis without CT evidence of acute cholecystitis. Calcified coronary artery and Aortic Atherosclerosis (ICD10-I70.0). Mild pericardial calcification without pericardial effusion. Chronic medical renal disease is suspected. 2. Visible IV contrast extravasation at the left antecubital fossa with an estimated volume of 58 mL. This should resolve with conservative treatment (including ice, elevation). Recommend monitoring for any skin changes over the next several days. Electronically Signed   By:  Genevie Ann M.D.   On: 04/07/2021 07:14     Flora Lipps, MD  [facility] 04/08/2021  If 7PM-7AM, please contact night-coverage

## 2021-04-08 NOTE — Progress Notes (Signed)
PHARMACY - PHYSICIAN COMMUNICATION CRITICAL VALUE ALERT - BLOOD CULTURE IDENTIFICATION (BCID)  Brian Martin is an 64 y.o. male who presented to [facility] on 04/06/2021 with a chief complaint of confusion and lethargy.  Assessment:  Patient currently afebrile but does have elevated wbc count at 18. Micro lab call for staph epi growing in 1/3 bottles. Discussed with on call MD and likely contaminant, will continue to follow for now off antibiotics.   Name of physician (or Provider) ContactedNevada Crane MD  Current antibiotics: none  Changes to prescribed antibiotics recommended:  Will observe off antibiotics  No results found for this or any previous visit.  Erin Hearing PharmD., BCPS Clinical Pharmacist 04/08/2021 9:22 PM

## 2021-04-08 NOTE — Care Management Note (Addendum)
1. GLARGINE-YFGN  ( SEMGLEE )  INJECTION 20 UNITS AT BEDTIME  NON-FORMULARY  PRIOR APPROVAL- YES # [phone]   ALTERNTIVE:   1. LANTUS PEN  10 UNITS ---Per his pharmacy he pays $105/ month--out of pocket not met COVER- YES  CO-PAY- $35.00  TIER- 3 DRUG  PRIOR APPROVAL- NO   2. TOUJEO  300 ML UNITS  COVER- YES  CO-PAY- $35.00  TIER- 3 DRUG  PRIOR APPROVAL- N0   3. BASAGLAR: NON-FORMULARY and must uses  LANTUS or  TOUJEO    1. XARELTO 20 MG DAILY  COVER- YES  CO-PAY- $ 137.74  TIER- 3 DRUG  PRIOR APPROVAL- NO   DEDUCTIBLE : MET  OUT-OF-POCKET:UNMET   PREFERRED PHARMACY : YES  [facility]

## 2021-04-08 NOTE — Plan of Care (Signed)
Patient is alert oriented x 1. Pt was sleep at beginning of shift. Pt is now awake and alert x 1. Pt insulin drip has been discontinued. MD informed and received new orders. Pt's daughter ruth called for an update.   Problem: Education: Goal: Knowledge of General Education information will improve Description: Including pain rating scale, medication(s)/side effects and non-pharmacologic comfort measures Outcome: Progressing   Problem: Health Behavior/Discharge Planning: Goal: Ability to manage health-related needs will improve Outcome: Progressing   Problem: Clinical Measurements: Goal: Ability to maintain clinical measurements within normal limits will improve Outcome: Progressing Goal: Will remain free from infection Outcome: Progressing Goal: Diagnostic test results will improve Outcome: Progressing Goal: Respiratory complications will improve Outcome: Progressing Goal: Cardiovascular complication will be avoided Outcome: Progressing   Problem: Activity: Goal: Risk for activity intolerance will decrease Outcome: Progressing   Problem: Nutrition: Goal: Adequate nutrition will be maintained Outcome: Progressing   Problem: Coping: Goal: Level of anxiety will decrease Outcome: Progressing   Problem: Elimination: Goal: Will not experience complications related to bowel motility Outcome: Progressing   Problem: Elimination: Goal: Will not experience complications related to bowel motility Outcome: Progressing Goal: Will not experience complications related to urinary retention Outcome: Progressing   Problem: Pain Managment: Goal: General experience of comfort will improve Outcome: Progressing   Problem: Safety: Goal: Ability to remain free from injury will improve Outcome: Progressing   Problem: Skin Integrity: Goal: Risk for impaired skin integrity will decrease Outcome: Progressing   Problem: Education: Goal: Ability to describe self-care measures that may  prevent or decrease complications (Diabetes Survival Skills Education) will improve Outcome: Progressing Goal: Individualized Educational Video(s) Outcome: Progressing   Problem: Coping: Goal: Ability to adjust to condition or change in health will improve Outcome: Progressing   Problem: Fluid Volume: Goal: Ability to maintain a balanced intake and output will improve Outcome: Progressing   Problem: Skin Integrity: Goal: Risk for impaired skin integrity will decrease Outcome: Progressing   Problem: Tissue Perfusion: Goal: Adequacy of tissue perfusion will improve Outcome: Progressing

## 2021-04-08 NOTE — Progress Notes (Signed)
Inpatient Diabetes Program Recommendations  AACE/ADA: New Consensus Statement on Inpatient Glycemic Control (2015)  Target Ranges:  Prepandial:   less than 140 mg/dL      Peak postprandial:   less than 180 mg/dL (1-2 hours)      Critically ill patients:  140 - 180 mg/dL   Lab Results  Component Value Date   GLUCAP 286 (H) 04/08/2021   HGBA1C 12.9 (H) 04/06/2021    Review of Glycemic Control Results for Brian Martin, Brian Martin (MRN [id number]) as of 04/08/2021 15:12  Ref. Range 04/08/2021 06:18 04/08/2021 12:18  Glucose-Capillary Latest Ref Range: 70 - 99 mg/dL 286 (H) 313 (H)   Diabetes history: DM2 Outpatient Diabetes medications: None Current orders for Inpatient glycemic control: Novolog 0-15 units TID and 0-5 units QHS, Semglee 20 units QHS  Inpatient Diabetes Program Recommendations:    Please increase Semglee 25 units QHS.   Add Novolog 3 units TID with meals if eats at least 50%.  Will continue to follow while inpatient.  Thank you, Reche Dixon, RN, BSN Diabetes Coordinator Inpatient Diabetes Program [phone] (team pager from 8a-5p)

## 2021-04-08 NOTE — Progress Notes (Signed)
Pt had burst of SVT, heart rate up to 150s. Pt HR now 90-100. No distress noted. Provider paged to inform.

## 2021-04-08 NOTE — Evaluation (Signed)
Physical Therapy Evaluation Patient Details Name: Brian Martin MRN: [id number] DOB: Jan 15, 1956 Today's Date: 04/08/2021   History of Present Illness  Pt is a 65 y/o male admitted 8/10 secondary to AMS. Thought to be secondary to acute metabolic encephalopathy and DKA. PMH includes HTN, CKD, a fib, dCHF, DM, and L BKA.  Clinical Impression  Pt admitted secondary to problem above with deficits below. Pt presenting with cognitive deficits throughout and reporting conflicting information about PLOF. Able to state his name, but could not state his birthday. Min to mod A for rolling for clean up as pt had taking his condom catheter off. Further mobility deferred as pt wanting to eat lunch. Given current deficits. Recommending SNF level therapies at d/c. Will continue to follow acutely.     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 Restrictions Weight Bearing Restrictions: No      Mobility  Bed Mobility Overal bed mobility: Needs Assistance Bed Mobility: Rolling Rolling: Min assist;Mod assist         General bed mobility comments: Min to mod A for rolling side to side for clean up. Max multimodal cues for sequencing. Placed in chair position at end of bed mobility for pt to eat lunch.    Transfers                    Ambulation/Gait                Stairs            Wheelchair Mobility    Modified Rankin (Stroke Patients Only)       Balance                                             Pertinent Vitals/Pain Pain Assessment: Faces Faces Pain Scale: No hurt    Home Living Family/patient expects to be discharged to:: Skilled nursing facility                       Prior Function           Comments: Unsure of accuracy as pt reporting initially that he used his prosthetic and no AD to ambulate. Then reports he used RW to ambulate. Told nursing he uses WC.     Hand Dominance        Extremity/Trunk Assessment   Upper Extremity Assessment Upper Extremity Assessment: Generalized weakness    Lower Extremity Assessment Lower Extremity Assessment: LLE deficits/detail;Generalized weakness LLE Deficits / Details: L BKA       Communication   Communication: No difficulties  Cognition Arousal/Alertness: Awake/alert Behavior During Therapy: Flat affect Overall Cognitive Status: No family/caregiver present to determine baseline cognitive functioning                                 General Comments: Pt not responding to many questions. Was able to report he was at the hospital. When asked the year he reported "70 something". Slow to process commands.      General Comments General comments (skin  integrity, edema, etc.): No family present    Exercises     Assessment/Plan    PT Assessment Patient needs continued PT services  PT Problem List Decreased strength;Decreased balance;Decreased mobility;Decreased activity tolerance;Decreased knowledge of use of DME;Decreased knowledge of precautions;Decreased safety awareness;Decreased cognition       PT Treatment Interventions DME instruction;Gait training;Stair training;Functional mobility training;Therapeutic activities;Therapeutic exercise;Balance training;Patient/family education    PT Goals (Current goals can be found in the Care Plan section)  Acute Rehab PT Goals PT Goal Formulation: Patient unable to participate in goal setting Time For Goal Achievement: 04/22/21 Potential to Achieve Goals: Fair    Frequency Min 2X/week   Barriers to discharge        Co-evaluation               AM-PAC PT "6 Clicks" Mobility  Outcome Measure Help needed turning from your back  to your side while in a flat bed without using bedrails?: A Little Help needed moving from lying on your back to sitting on the side of a flat bed without using bedrails?: A Lot Help needed moving to and from a bed to a chair (including a wheelchair)?: Total Help needed standing up from a chair using your arms (e.g., wheelchair or bedside chair)?: Total Help needed to walk in hospital room?: Total Help needed climbing 3-5 steps with a railing? : Total 6 Click Score: 9    End of Session Equipment Utilized During Treatment: Gait belt Activity Tolerance: Patient tolerated treatment well Patient left: in bed;with call bell/phone within reach;with bed alarm set Nurse Communication: Mobility status PT Visit Diagnosis: Unsteadiness on feet (R26.81);Muscle weakness (generalized) (M62.81);Difficulty in walking, not elsewhere classified (R26.2)    Time: [id number] PT Time Calculation (min) (ACUTE ONLY): 16 min   Charges:   PT Evaluation $PT Eval Moderate Complexity: 1 Mod          Reuel Derby, PT, DPT  Acute Rehabilitation Services  Pager: [phone] Office: [phone]   Rudean Hitt 04/08/2021, 3:07 PM

## 2021-04-08 NOTE — TOC Benefit Eligibility Note (Signed)
Transition of Care [facility]) Benefit Eligibility Note    Patient Details  Name: Brian Martin MRN: [id number] Date of Birth: Jan 25, 1956   Medication/Dose: Alveda Reasons  20 MG DAILY  Covered?: Yes  Tier: 3 Drug  Prescription Coverage Preferred Pharmacy: [facility] with Person/Company/Phone Number:: [facility]  @ [place] # [phone]  Co-Pay: $137.74  Prior Approval: No  Deductible: Met (OUT-OF-POCKET:UNMET)  Additional Notes: GLARGINE -YFGN ( SEMGLEE  )  INJECTION 20 UNITS AT BED TIME ALSO 100 UNITS  : Crecencio Mc Phone Number: 04/08/2021, 3:51 PM

## 2021-04-09 LAB — CBC
HCT: 49.2 % (ref 39.0–52.0)
Hemoglobin: 16.5 g/dL (ref 13.0–17.0)
MCH: 31.9 pg (ref 26.0–34.0)
MCHC: 33.5 g/dL (ref 30.0–36.0)
MCV: 95.2 fL (ref 80.0–100.0)
Platelets: 182 10*3/uL (ref 150–400)
RBC: 5.17 MIL/uL (ref 4.22–5.81)
RDW: 13.2 % (ref 11.5–15.5)
WBC: 12.7 10*3/uL — ABNORMAL HIGH (ref 4.0–10.5)
nRBC: 0 % (ref 0.0–0.2)

## 2021-04-09 LAB — BASIC METABOLIC PANEL
Anion gap: 8 (ref 5–15)
BUN: 19 mg/dL (ref 8–23)
CO2: 25 mmol/L (ref 22–32)
Calcium: 8.6 mg/dL — ABNORMAL LOW (ref 8.9–10.3)
Chloride: 107 mmol/L (ref 98–111)
Creatinine, Ser: 1.03 mg/dL (ref 0.61–1.24)
GFR, Estimated: >60 mL/min (ref >60)
Glucose, Bld: 144 mg/dL — ABNORMAL HIGH (ref 70–99)
Potassium: 3.5 mmol/L (ref 3.5–5.1)
Sodium: 140 mmol/L (ref 135–145)

## 2021-04-09 LAB — GLUCOSE, CAPILLARY
Glucose-Capillary: 136 mg/dL — ABNORMAL HIGH (ref 70–99)
Glucose-Capillary: 217 mg/dL — ABNORMAL HIGH (ref 70–99)
Glucose-Capillary: 96 mg/dL (ref 70–99)
Glucose-Capillary: 141 mg/dL — ABNORMAL HIGH (ref 70–99)

## 2021-04-09 LAB — PHOSPHORUS: Phosphorus: 3.2 mg/dL (ref 2.5–4.6)

## 2021-04-09 LAB — MAGNESIUM: Magnesium: 1.7 mg/dL (ref 1.7–2.4)

## 2021-04-09 NOTE — Plan of Care (Signed)
Patient is alert oriented x 2. Pt seems more alert tonight vs the previous night. Pt asked for something to drink. Pt has IV fluids continued per order. CBG checked per order.   Problem: Education: Goal: Knowledge of General Education information will improve Description: Including pain rating scale, medication(s)/side effects and non-pharmacologic comfort measures Outcome: Progressing   Problem: Health Behavior/Discharge Planning: Goal: Ability to manage health-related needs will improve Outcome: Progressing   Problem: Clinical Measurements: Goal: Ability to maintain clinical measurements within normal limits will improve Outcome: Progressing Goal: Will remain free from infection Outcome: Progressing Goal: Diagnostic test results will improve Outcome: Progressing Goal: Respiratory complications will improve Outcome: Progressing Goal: Cardiovascular complication will be avoided Outcome: Progressing   Problem: Activity: Goal: Risk for activity intolerance will decrease Outcome: Progressing   Problem: Nutrition: Goal: Adequate nutrition will be maintained Outcome: Progressing   Problem: Coping: Goal: Level of anxiety will decrease Outcome: Progressing   Problem: Elimination: Goal: Will not experience complications related to bowel motility Outcome: Progressing Goal: Will not experience complications related to urinary retention Outcome: Progressing   Problem: Pain Managment: Goal: General experience of comfort will improve Outcome: Progressing   Problem: Safety: Goal: Ability to remain free from injury will improve Outcome: Progressing   Problem: Skin Integrity: Goal: Risk for impaired skin integrity will decrease Outcome: Progressing   Problem: Education: Goal: Ability to describe self-care measures that may prevent or decrease complications (Diabetes Survival Skills Education) will improve Outcome: Progressing Goal: Individualized Educational Video(s) Outcome:  Progressing   Problem: Coping: Goal: Ability to adjust to condition or change in health will improve Outcome: Progressing   Problem: Fluid Volume: Goal: Ability to maintain a balanced intake and output will improve Outcome: Progressing   Problem: Health Behavior/Discharge Planning: Goal: Ability to identify and utilize available resources and services will improve Outcome: Progressing Goal: Ability to manage health-related needs will improve Outcome: Progressing   Problem: Metabolic: Goal: Ability to maintain appropriate glucose levels will improve Outcome: Progressing   Problem: Nutritional: Goal: Maintenance of adequate nutrition will improve Outcome: Progressing Goal: Progress toward achieving an optimal weight will improve Outcome: Progressing   Problem: Skin Integrity: Goal: Risk for impaired skin integrity will decrease Outcome: Progressing   Problem: Tissue Perfusion: Goal: Adequacy of tissue perfusion will improve Outcome: Progressing

## 2021-04-09 NOTE — Progress Notes (Signed)
Occupational Therapy Evaluation Patient Details Name: Brian Martin MRN: [id number] DOB: [birth year] Today's Date: 04/09/2021    History of Present Illness Pt is a 65 y/o male admitted 8/10 secondary to AMS. Thought to be secondary to acute metabolic encephalopathy and DKA. PMH includes HTN, CKD, a fib, dCHF, DM, and L BKA.   Clinical Impression   PTA pt lives with his wife, uses a cane for ambulation and has had multiple falls. Wife assists with bathing/dressing and pericare as pt allows. Per daughter pt "smokes weed all day everyday" and has demonstrated increased confusion over the past 3-6 months. Daughter states pt does not take his insulin and his wife does not confront him or make him do anything he does not want to do. Daughter states his sugar level has most likely been high for months, however due to his cognition would like "to see if he has dementia". Pt apparently confused but able to progress OOB to chair using squat pivot transfer to R with min A. Mod A with ADL tasks with exception of max A with pericare. Recommend rehab at SNF. Will follow acutely.     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      Mobility Bed Mobility Overal bed mobility: Needs Assistance Bed Mobility: Rolling;Sidelying to Sit Rolling: Modified independent (Device/Increase time) Sidelying to sit: Supervision            Transfers Overall transfer level: Needs assistance   Transfers: Squat Pivot Transfers     Squat pivot transfers: Min assist - toward R; recliner arm dropped          Balance Overall balance assessment: History of Falls;Needs assistance   Sitting balance-Leahy Scale: Fair       Standing balance-Leahy Scale: Poor                              ADL either performed or assessed with clinical judgement   ADL Overall ADL's : Needs assistance/impaired     Grooming: Supervision/safety;Set up;Sitting   Upper Body Bathing: Set up;Supervision/ safety;Sitting   Lower Body Bathing: Moderate assistance;Bed level   Upper Body Dressing : Set up;Bed level   Lower Body Dressing: Moderate assistance;Sitting/lateral leans       Toileting- Clothing Manipulation and Hygiene: Maximal assistance (history of incontinence)       Functional mobility during ADLs: Minimal assistance (squat pivot)       Vision Baseline Vision/History: Wears glasses Wears Glasses: At all times Additional Comments: poor vision per daughter     Perception     Praxis      Pertinent Vitals/Pain Pain Assessment: Faces Faces Pain Scale: No hurt     Hand Dominance Right   Extremity/Trunk Assessment Upper Extremity Assessment Upper Extremity Assessment: Generalized weakness   Lower Extremity Assessment Lower Extremity Assessment: LLE deficits/detail LLE Deficits / Details: L BKA   Cervical / Trunk Assessment Cervical / Trunk Assessment: Normal   Communication Communication Communication: No difficulties   Cognition Arousal/Alertness: Awake/alert Behavior During Therapy: Flat affect Overall Cognitive Status: Impaired/Different from baseline Area of Impairment: Orientation;Attention;Memory;Safety/judgement;Awareness;Problem solving                 Orientation Level: Disoriented to;Place;Time;Situation  Current Attention Level: Sustained Memory: Decreased short-term memory   Safety/Judgement: Decreased awareness of safety;Decreased awareness of deficits Awareness: Intellectual Problem Solving: Slow processing General Comments:    General Comments       Exercises     Shoulder Instructions      Home Living Family/patient expects to be discharged to:: Skilled nursing facility                                  Additional Comments: daughter states house needs "to be condemned"      Prior Functioning/Environment          Comments: required assistance with pericare due to increased incontinence; wife also assisted with bathing and dressing since amputation - has had to help more in the last several months; daughter states he has been more forgetful; states he was not taking his insulin (Used a cane with his prosthetic; multiple falls; will not use RW)        OT Problem List: Decreased strength;Decreased activity tolerance;Impaired balance (sitting and/or standing);Decreased cognition;Decreased knowledge of use of DME or AE      OT Treatment/Interventions: Self-care/ADL training;Therapeutic exercise;DME and/or AE instruction;Therapeutic activities;Cognitive remediation/compensation;Patient/family education;Balance training    OT Goals(Current goals can be found in the care plan section) Acute Rehab OT Goals Patient Stated Goal: per family to get some rehab OT Goal Formulation: With family Time For Goal Achievement: 04/23/21 Potential to Achieve Goals: Fair  OT Frequency: Min 2X/week   Barriers to D/C: Other (comment) (per daughter house needs to be "condemned"; no running water; floors are unsafe)          Co-evaluation              AM-PAC OT "6 Clicks" Daily Activity     Outcome Measure Help from another person eating meals?: None Help from another person taking care of personal grooming?: A Little Help from another person toileting, which includes using toliet, bedpan, or urinal?: A Lot Help from another person bathing (including washing, rinsing, drying)?: A Lot Help from another person to put on and taking off regular upper body clothing?: A Little Help from another person to put on and taking off regular lower body clothing?: A Lot 6 Click Score: 16   End of Session Equipment Utilized During Treatment: Gait belt;Rolling walker Nurse Communication: Mobility  status  Activity Tolerance: Patient tolerated treatment well Patient left: in chair;with call bell/phone within reach;with chair alarm set  OT Visit Diagnosis: Other abnormalities of gait and mobility (R26.89);Muscle weakness (generalized) (M62.81);Low vision, both eyes (H54.2);Other symptoms and signs involving cognitive function                Time: 1001-1026 OT Time Calculation (min): 25 min Charges:  OT General Charges $OT Visit: 1 Visit OT Evaluation $OT Eval Moderate Complexity: 1 Mod OT Treatments $Self Care/Home Management : 8-22 mins  Maurie Boettcher, OT/L   Acute OT Clinical Specialist Acute Rehabilitation Services Pager [phone] Office [phone]   [facility] 04/09/2021, 10:56 AM

## 2021-04-09 NOTE — Progress Notes (Signed)
Pts daughter, [name],  called to give update. [name] stated she would be dropping off his prosthetic. [name] stated that her mother was very hard of hearing. She would be a better resource in communication regarding discharge planning. Her number is listed on the chart.

## 2021-04-09 NOTE — Progress Notes (Signed)
PROGRESS NOTE  Brian Martin [id number] DOB: 10-25-1955 DOA: 04/06/2021 PCP: Marco Collie, MD   LOS: 2 days   Brief narrative: Patient is a 65 years old male with past medical history of diabetes mellitus type 2, atrial fibrillation, history of peripheral vascular disease status post left BKA, CKD stage IIIa, congestive heart failure, presented to the hospital with confusion lethargy for several weeks with the strange behaviors and forgetfulness.  He was also progressively weak and wobbly.  He also had episodes of incontinence and poor oral intake.  Due to patient's worsening confusion and bizarre behavior patient was brought into the hospital.  His blood glucose levels were elevated at 518.  In the ED CT head scan was done which was negative for acute findings except for old left basal ganglia infarct.  Ammonia was 9.  Blood sugars were elevated with elevated anion gap suggestive of DKA.  Patient also was noted to have significant leukocytosis.  Patient was started on IV fluids, insulin drip and was admitted to hospital for further evaluation and treatment.  Assessment/Plan:  Principal Problem:   Acute metabolic encephalopathy Active Problems:   Essential hypertension   AF (paroxysmal atrial fibrillation) (HCC)   Chronic kidney disease, stage 3a (HCC)   Chronic diastolic CHF (congestive heart failure) (HCC)   Diabetic ketoacidosis without coma associated with type 2 diabetes mellitus ([place])   Leukocytosis   Mixed hyperlipidemia  Acute metabolic encephalopathy likely secondary to uncontrolled hyperglycemia and DKA.  Blood glucose levels have improved with insulin drip and IV fluids.  TSH is within normal limits.  Folic acid low.  HIV was negative.  Urine drug screen was positive for tetrahydrocannabinol.  Blood cultures positive for staph epidermidis in aerobic bottle likely contaminant.  We will continue to monitor off antibiotic.  Lactate was slightly elevated secondary to volume  depletion .  COVID-19 and influenza negative.  Ammonia was 9.  UA showed hemoglobin and ketones but nitrite was negative.  Encephalopathy has significantly improved.  Diabetic ketoacidosis history of diabetes mellitus type 2.   Hemoglobin A1c of 12.9.  Has been transitioned to long-acting insulin and sliding scale insulin.    Diabetic coordinator on board.  Patient was emphasized the need for compliance with medication.  History of congestive heart failure.   Initially received IV fluids.  Compensated at this time   Paroxysmal atrial fibrillation.  Known ejection fraction of 55% with grade 1 diastolic dysfunction in [id number].  Patient had short run of rapid A. fib.  Currently on Coreg and Eliquis.  Currently rate controlled.  Mild hypokalemia.  Improved with replacement.  Mild hypophosphatemia.  Improved with replacement.  CKD stage IIIa.  Latest creatinine of 1.0.  We will continue to monitor closely.   Essential hypertension.  Continue Coreg.  Discontinue IV fluids.  Left BKA Continue supportive care   Leukocytosis.   Could be reactive.    DVT prophylaxis:  apixaban (ELIQUIS) tablet 5 mg   Code Status: Full code  Family Communication: None  Status is: Inpatient  Remains inpatient appropriate because:IV treatments appropriate due to intensity of illness or inability to take PO and Inpatient level of care appropriate due to severity of illness  Dispo: The patient is from: Home              Anticipated d/c is to: Skilled nursing facility placement as per PT evaluation.              Patient currently is not medically stable to d/c.  Difficult to place patient No   Consultants: None  Procedures: None  Anti-infectives:  None  Anti-infectives (From admission, onward)    None      Subjective: Today, patient was seen and examined at bedside.  Alert awake and communicative.  Denies pain, nausea vomiting fever or chills.  Objective: Vitals:   04/09/21 0739 04/09/21  1140  BP: (!) 183/86 (!) 156/89  Pulse: 67 70  Resp:    Temp: 97.6 F (36.4 C)   SpO2: 98% 98%    Intake/Output Summary (Last 24 hours) at 04/09/2021 1404 Last data filed at 04/09/2021 0700 Gross per 24 hour  Intake 1805.23 ml  Output 700 ml  Net 1105.23 ml    There were no vitals filed for this visit. There is no height or weight on file to calculate BMI.   Physical Exam:  General:  Average built, not in obvious distress HENT:   No scleral pallor or icterus noted. Oral mucosa is moist.  Chest:  Clear breath sounds.  Diminished breath sounds bilaterally. No crackles or wheezes.  CVS: S1 &S2 heard. No murmur.  Regular rate and rhythm. Abdomen: Soft, nontender, nondistended.  Bowel sounds are heard.   Extremities: Left below-knee amputation. Psych: Alert, awake and oriented, normal mood CNS:  No cranial nerve deficits.  Power equal in all extremities.   Skin: Warm and dry.  No rashes noted.   Data Review: I have personally reviewed the following laboratory data and studies,  CBC: Recent Labs  Lab 04/06/21 2008 04/06/21 2243 04/08/21 0327 04/09/21 0350  WBC 24.6*  --  18.4* 12.7*  NEUTROABS 22.0*  --   --   --   HGB 17.9* 17.7* 17.6* 16.5  HCT 54.9* 52.0 53.1* 49.2  MCV 98.2  --  95.5 95.2  PLT 280  --  229 [id number]    Basic Metabolic Panel: Recent Labs  Lab 04/07/21 1504 04/07/21 1739 04/07/21 2118 04/08/21 0327 04/08/21 0840 04/09/21 0350  NA 143 144 144  --  141 140  K 3.9 4.3 3.2*  --  3.6 3.5  CL 112* 113* 114*  --  108 107  CO2 '22 22 23  '$ --  25 25  GLUCOSE 177* 151* 157*  --  251* 144*  BUN 25* 24* 22  --  19 19  CREATININE 1.25* 1.18 1.07  --  1.21 1.03  CALCIUM 9.3 9.2 8.8*  --  8.8* 8.6*  MG  --   --   --  1.8  --  1.7  PHOS  --   --   --  2.4*  --  3.2    Liver Function Tests: Recent Labs  Lab 04/06/21 2008  AST 34  ALT 17  ALKPHOS 90  BILITOT 1.5*  PROT 7.5  ALBUMIN 3.8    No results for input(s): LIPASE, AMYLASE in the last 168  hours. Recent Labs  Lab 04/06/21 2348  AMMONIA 9    Cardiac Enzymes: No results for input(s): CKTOTAL, CKMB, CKMBINDEX, TROPONINI in the last 168 hours. BNP (last 3 results) No results for input(s): BNP in the last 8760 hours.  ProBNP (last 3 results) No results for input(s): PROBNP in the last 8760 hours.  CBG: Recent Labs  Lab 04/08/21 1218 04/08/21 1631 04/08/21 2116 04/09/21 0637 04/09/21 1143  GLUCAP 313* 179* 114* 136* 217*    Recent Results (from the past 240 hour(s))  Resp Panel by RT-PCR (Flu A&B, Covid)     Status: None   Collection  Time: 04/07/21 12:01 AM   Specimen: Nasopharyngeal(NP) swabs in vial transport medium  Result Value Ref Range Status   SARS Coronavirus 2 by RT PCR NEGATIVE NEGATIVE Final    Comment: (NOTE) SARS-CoV-2 target nucleic acids are NOT DETECTED.  The SARS-CoV-2 RNA is generally detectable in upper respiratory specimens during the acute phase of infection. The lowest concentration of SARS-CoV-2 viral copies this assay can detect is 138 copies/mL. A negative result does not preclude SARS-Cov-2 infection and should not be used as the sole basis for treatment or other patient management decisions. A negative result may occur with  improper specimen collection/handling, submission of specimen other than nasopharyngeal swab, presence of viral mutation(s) within the areas targeted by this assay, and inadequate number of viral copies(<138 copies/mL). A negative result must be combined with clinical observations, patient history, and epidemiological information. The expected result is Negative.  Fact Sheet for Patients:  EntrepreneurPulse.com.au  Fact Sheet for Healthcare Providers:  IncredibleEmployment.be  This test is no t yet approved or cleared by the [place] FDA and  has been authorized for detection and/or diagnosis of SARS-CoV-2 by FDA under an Emergency Use Authorization (EUA). This EUA  will remain  in effect (meaning this test can be used) for the duration of the COVID-19 declaration under Section 564(b)(1) of the Act, 21 U.S.C.section 360bbb-3(b)(1), unless the authorization is terminated  or revoked sooner.       Influenza A by PCR NEGATIVE NEGATIVE Final   Influenza B by PCR NEGATIVE NEGATIVE Final    Comment: (NOTE) The Xpert Xpress SARS-CoV-2/FLU/RSV plus assay is intended as an aid in the diagnosis of influenza from Nasopharyngeal swab specimens and should not be used as a sole basis for treatment. Nasal washings and aspirates are unacceptable for Xpert Xpress SARS-CoV-2/FLU/RSV testing.  Fact Sheet for Patients: EntrepreneurPulse.com.au  Fact Sheet for Healthcare Providers: IncredibleEmployment.be  This test is not yet approved or cleared by the [place] FDA and has been authorized for detection and/or diagnosis of SARS-CoV-2 by FDA under an Emergency Use Authorization (EUA). This EUA will remain in effect (meaning this test can be used) for the duration of the COVID-19 declaration under Section 564(b)(1) of the Act, 21 U.S.C. section 360bbb-3(b)(1), unless the authorization is terminated or revoked.  Performed at [facility], [place] [address] [place], [place] [address]   Culture, blood (routine x 2)     Status: None (Preliminary result)   Collection Time: 04/07/21  3:10 AM   Specimen: BLOOD  Result Value Ref Range Status   Specimen Description BLOOD LEFT HAND  Final   Special Requests   Final    BOTTLES DRAWN AEROBIC AND ANAEROBIC Blood Culture adequate volume   Culture   Final    NO GROWTH 2 DAYS Performed at [facility], [place] [address], [place], [place] [address]    Report Status PENDING  Incomplete  Culture, blood (Routine X 2) w Reflex to ID Panel     Status: Abnormal (Preliminary result)   Collection Time: 04/08/21  3:30 AM   Specimen: BLOOD  Result Value Ref Range Status   Specimen  Description BLOOD LEFT ANTECUBITAL  Final   Special Requests   Final    BOTTLES DRAWN AEROBIC ONLY Blood Culture adequate volume   Culture  Setup Time   Final    GRAM POSITIVE COCCI IN CLUSTERS AEROBIC BOTTLE ONLY Organism ID to follow CRITICAL RESULT CALLED TO, READ BACK BY AND VERIFIED WITH: Tillman Sers PHARMD  1931 04/08/21 A BROWNING    Culture (A)  Final    STAPHYLOCOCCUS EPIDERMIDIS THE SIGNIFICANCE OF ISOLATING THIS ORGANISM FROM A SINGLE SET OF BLOOD CULTURES WHEN MULTIPLE SETS ARE DRAWN IS UNCERTAIN. PLEASE NOTIFY THE MICROBIOLOGY DEPARTMENT WITHIN ONE WEEK IF SPECIATION AND SENSITIVITIES ARE REQUIRED. Performed at [facility], [place] [address], [place], [place] [address]    Report Status PENDING  Incomplete  Blood Culture ID Panel (Reflexed)     Status: Abnormal   Collection Time: 04/08/21  3:30 AM  Result Value Ref Range Status   Enterococcus faecalis NOT DETECTED NOT DETECTED Final   Enterococcus Faecium NOT DETECTED NOT DETECTED Final   Listeria monocytogenes NOT DETECTED NOT DETECTED Final   Staphylococcus species DETECTED (A) NOT DETECTED Final    Comment: CRITICAL RESULT CALLED TO, READ BACK BY AND VERIFIED WITHTillman Sers PHARMD [id number] 04/08/21 A BROWNING    Staphylococcus aureus (BCID) NOT DETECTED NOT DETECTED Final   Staphylococcus epidermidis DETECTED (A) NOT DETECTED Final    Comment: Methicillin (oxacillin) resistant coagulase negative staphylococcus. Possible blood culture contaminant (unless isolated from more than one blood culture draw or clinical case suggests pathogenicity). No antibiotic treatment is indicated for blood  culture contaminants. CRITICAL RESULT CALLED TO, READ BACK BY AND VERIFIED WITH: Tillman Sers PHARMD [id number] 04/08/21 A BROWNING    Staphylococcus lugdunensis NOT DETECTED NOT DETECTED Final   Streptococcus species NOT DETECTED NOT DETECTED Final   Streptococcus agalactiae NOT DETECTED NOT DETECTED Final   Streptococcus pneumoniae NOT DETECTED NOT  DETECTED Final   Streptococcus pyogenes NOT DETECTED NOT DETECTED Final   A.calcoaceticus-baumannii NOT DETECTED NOT DETECTED Final   Bacteroides fragilis NOT DETECTED NOT DETECTED Final   Enterobacterales NOT DETECTED NOT DETECTED Final   Enterobacter cloacae complex NOT DETECTED NOT DETECTED Final   Escherichia coli NOT DETECTED NOT DETECTED Final   Klebsiella aerogenes NOT DETECTED NOT DETECTED Final   Klebsiella oxytoca NOT DETECTED NOT DETECTED Final   Klebsiella pneumoniae NOT DETECTED NOT DETECTED Final   Proteus species NOT DETECTED NOT DETECTED Final   Salmonella species NOT DETECTED NOT DETECTED Final   Serratia marcescens NOT DETECTED NOT DETECTED Final   Haemophilus influenzae NOT DETECTED NOT DETECTED Final   Neisseria meningitidis NOT DETECTED NOT DETECTED Final   Pseudomonas aeruginosa NOT DETECTED NOT DETECTED Final   Stenotrophomonas maltophilia NOT DETECTED NOT DETECTED Final   Candida albicans NOT DETECTED NOT DETECTED Final   Candida auris NOT DETECTED NOT DETECTED Final   Candida glabrata NOT DETECTED NOT DETECTED Final   Candida krusei NOT DETECTED NOT DETECTED Final   Candida parapsilosis NOT DETECTED NOT DETECTED Final   Candida tropicalis NOT DETECTED NOT DETECTED Final   Cryptococcus neoformans/gattii NOT DETECTED NOT DETECTED Final   Methicillin resistance mecA/C DETECTED (A) NOT DETECTED Final    Comment: CRITICAL RESULT CALLED TO, READ BACK BY AND VERIFIED WITHTillman Sers [facility] [id number] 04/08/21 A BROWNING Performed at [facility], [address], [place], [place] [address]       Studies: No results found.   Flora Lipps, MD  [facility] 04/09/2021  If 7PM-7AM, please contact night-coverage

## 2021-04-10 LAB — CULTURE, BLOOD (ROUTINE X 2): Special Requests: ADEQUATE

## 2021-04-10 LAB — GLUCOSE, CAPILLARY
Glucose-Capillary: 168 mg/dL — ABNORMAL HIGH (ref 70–99)
Glucose-Capillary: 182 mg/dL — ABNORMAL HIGH (ref 70–99)
Glucose-Capillary: 172 mg/dL — ABNORMAL HIGH (ref 70–99)
Glucose-Capillary: 133 mg/dL — ABNORMAL HIGH (ref 70–99)

## 2021-04-10 NOTE — Social Work (Addendum)
9:25am Called patient's wife Mardene Celeste- no answer- will try again later today. 10:38am- no answer   Thurmond Butts, MSW, LCSW Clinical Social Worker

## 2021-04-10 NOTE — NC FL2 (Signed)
[place] LEVEL OF CARE SCREENING TOOL     IDENTIFICATION  Patient Name: Brian Martin Birthdate: [birth year] Sex: male Admission Date (Current Location): 04/06/2021  [facility] and [place] Number:  Herbalist and Address:  [facility], [place] [address] [place], [place] [address]      Provider Number: [id number]  Attending Physician Name and Address:  Flora Lipps, MD  Relative Name and Phone Number:  Jerald Ramones, [phone]    Current Level of Care: Hospital Recommended Level of Care: [place] Prior Approval Number:    Date Approved/Denied: 04/10/21 PASRR Number: [id number]  Discharge Plan: Home    Current Diagnoses: Patient Active Problem List   Diagnosis Date Noted   Acute metabolic encephalopathy 0000000   Diabetic ketoacidosis without coma associated with type 2 diabetes mellitus ([place]) 04/07/2021   Leukocytosis 04/07/2021   Mixed hyperlipidemia 04/07/2021   Cellulitis 02/18/2016   Chronic diastolic CHF (congestive heart failure) ([place]) 05/05/2015   Encounter for therapeutic drug monitoring 11/21/2013   Chronic anticoagulation 06/14/2013   GIB (gastrointestinal bleeding) 06/13/2013   Morbid obesity ([place]) 02/17/2013   Open wound of knee, leg (except thigh), and ankle, without mention of complication [id number]   DIABETES MELLITUS, TYPE II 08/12/2010   CARDIOMYOPATHY, DILATED 08/12/2010   Chronic kidney disease, stage 3a ([place]) 08/12/2010   BKA, LEFT LEG 08/12/2010   Essential hypertension 08/09/2010   AF (paroxysmal atrial fibrillation) ([place]) 08/09/2010    Orientation RESPIRATION BLADDER Height & Weight        Normal Incontinent Weight:   Height:     BEHAVIORAL SYMPTOMS/MOOD NEUROLOGICAL BOWEL NUTRITION STATUS      Incontinent Diet (carb modified, heart healthy)  AMBULATORY STATUS COMMUNICATION OF NEEDS Skin   Limited Assist Verbally Normal                       Personal Care  Assistance Level of Assistance  Bathing, Feeding, Dressing Bathing Assistance: Limited assistance Feeding assistance: Limited assistance Dressing Assistance: Limited assistance     Functional Limitations Info             [place]  PT (By licensed PT), OT (By licensed OT)     PT Frequency: 5x weekly OT Frequency: 5x weekly            Contractures Contractures Info: Not present    Additional Factors Info  Code Status, Allergies Code Status Info: Full Allergies Info: NKDA           Current Medications (04/10/2021):  This is the current hospital active medication list Current Facility-Administered Medications  Medication Dose Route Frequency Provider Last Rate Last Admin   acetaminophen (TYLENOL) tablet 650 mg  650 mg Oral Q6H PRN Vernelle Emerald, MD   650 mg at 04/08/21 2122   Or   acetaminophen (TYLENOL) suppository 650 mg  650 mg Rectal Q6H PRN Vernelle Emerald, MD       albuterol (PROVENTIL) (2.5 MG/3ML) 0.083% nebulizer solution 2.5 mg  2.5 mg Nebulization Q6H PRN Shalhoub, Sherryll Burger, MD       apixaban Arne [place]) tablet 5 mg  5 mg Oral BID Vernelle Emerald, MD   5 mg at 04/10/21 0947   carvedilol (COREG) tablet 25 mg  25 mg Oral BID WC Vernelle Emerald, MD   25 mg at 04/10/21 0947   dextrose 50 % solution 0-50 mL  0-50 mL Intravenous PRN Inda Merlin  J, MD       fluticasone furoate-vilanterol (BREO ELLIPTA) 200-25 MCG/INH 1 puff  1 puff Inhalation Daily Shalhoub, Sherryll Burger, MD   1 puff at 04/10/21 0840   insulin aspart (novoLOG) injection 0-15 Units  0-15 Units Subcutaneous TID WC Pokhrel, Laxman, MD   3 Units at 04/10/21 1223   insulin aspart (novoLOG) injection 0-5 Units  0-5 Units Subcutaneous QHS Pokhrel, Laxman, MD   5 Units at 04/08/21 2120   insulin aspart (novoLOG) injection 3 Units  3 Units Subcutaneous TID WC Pokhrel, Laxman, MD   3 Units at 04/10/21 1223   insulin glargine-yfgn (SEMGLEE) injection 25 Units  25 Units  Subcutaneous QHS Pokhrel, Laxman, MD   25 Units at 04/09/21 2327   lisinopril (ZESTRIL) tablet 10 mg  10 mg Oral Daily Shalhoub, Sherryll Burger, MD   10 mg at 04/10/21 0947   ondansetron (ZOFRAN) tablet 4 mg  4 mg Oral Q6H PRN Shalhoub, Sherryll Burger, MD       Or   ondansetron [facility]) injection 4 mg  4 mg Intravenous Q6H PRN Shalhoub, Sherryll Burger, MD       pantoprazole (PROTONIX) EC tablet 40 mg  40 mg Oral Daily Shalhoub, Sherryll Burger, MD   40 mg at 04/10/21 0947   sertraline (ZOLOFT) tablet 25 mg  25 mg Oral Daily Vernelle Emerald, MD   25 mg at 04/10/21 0948   simvastatin (ZOCOR) tablet 10 mg  10 mg Oral Daily Shalhoub, Sherryll Burger, MD   10 mg at 04/10/21 [id number]     Discharge Medications: Please see discharge summary for a list of discharge medications.  Relevant Imaging Results:  Relevant Lab Results:   Additional Information SSN: [id number]  Oretha Milch, LCSW

## 2021-04-10 NOTE — TOC Progression Note (Signed)
Transition of Care [facility]) - Progression Note    Patient Details  Name: SIMRAN VIERLING MRN: [id number] Date of Birth: 06-20-1956  Transition of Care [facility]) CM/SW [place], [place] Phone Number: 04/10/2021, 2:20 PM  Clinical Narrative:    CSW spoke with pt's wife, however she has impaired hearing and asked that CSW reach out to her daughter, Ned Grace. Dtr noted that they are interested in SNF, and pt has never been before. She advised CSW that pt has been slowly declining over the last few years. He lives home with his wife, who is struggling to care for him. She notes that this may lead to long term care, she was advised to begin the medicaid process.  Dtr noted that she has no SNF preference, except that it be in [place]. Pt has not had any covid vaccines. Medicare.gov info was given, dtr said she would discuss it with her mother. CSW will complete workup and faxout to relevant facilities. TOC will continue to follow.   Expected Discharge Plan: [place] Barriers to Discharge: Continued Medical Work up, SNF Pending bed offer, Ship broker  Expected Discharge Plan and Services Expected Discharge Plan: [place]   Discharge Planning Services: CM Consult Post Acute Care Choice: [place] Living arrangements for the past 2 months: Single Family Home                                       Social Determinants of Health (SDOH) Interventions    Readmission Risk Interventions No flowsheet data found.

## 2021-04-10 NOTE — Progress Notes (Signed)
PROGRESS NOTE  NATANEL ANGOTTI [id number] DOB: November 07, 1955 DOA: 04/06/2021 PCP: Marco Collie, MD   LOS: 3 days   Brief narrative: Patient is a 65 years old male with past medical history of diabetes mellitus type 2, atrial fibrillation, history of peripheral vascular disease status post left BKA, CKD stage IIIa, congestive heart failure, presented to the hospital with confusion lethargy for several weeks with the strange behaviors and forgetfulness.  He was also progressively weak and wobbly.  He also had episodes of incontinence and poor oral intake.  Due to patient's worsening confusion and bizarre behavior patient was brought into the hospital.  His blood glucose levels were elevated at 518.  In the ED CT head scan was done which was negative for acute findings except for old left basal ganglia infarct.  Ammonia was 9.  Blood sugars were elevated with elevated anion gap suggestive of DKA.  Patient also was noted to have significant leukocytosis.  Patient was started on IV fluids, insulin drip and was admitted to hospital for further evaluation and treatment.  Assessment/Plan:  Principal Problem:   Acute metabolic encephalopathy Active Problems:   Essential hypertension   AF (paroxysmal atrial fibrillation) (HCC)   Chronic kidney disease, stage 3a (HCC)   Chronic diastolic CHF (congestive heart failure) (HCC)   Diabetic ketoacidosis without coma associated with type 2 diabetes mellitus ([place])   Leukocytosis   Mixed hyperlipidemia  Acute metabolic encephalopathy  likely secondary to uncontrolled hyperglycemia and DKA.  Blood glucose levels have improved with insulin drip and IV fluids.  TSH is within normal limits.  Folic acid low.  HIV was negative.  Urine drug screen was positive for tetrahydrocannabinol.  Blood cultures positive for staph epidermidis in aerobic bottle likely contaminant.  We will continue to monitor off antibiotic.  Lactate was slightly elevated secondary to volume  depletion .  COVID-19 and influenza negative.  Ammonia was 9.  UA showed hemoglobin and ketones but nitrite was negative.  Encephalopathy has significantly improved.  Diabetic ketoacidosis history of diabetes mellitus type 2.   Hemoglobin A1c of 12.9.  Has been transitioned to long-acting insulin and sliding scale insulin.    Diabetic coordinator on board.  Patient was emphasized the need for compliance with medication. POC of 168.  History of congestive heart failure.   Initially received IV fluids.  Compensated at this time   Paroxysmal atrial fibrillation.  Known ejection fraction of 55% with grade 1 diastolic dysfunction in [id number].  Patient had short run of rapid A. fib.  Currently on Coreg and Eliquis.  Currently rate controlled.  Mild hypokalemia.  Improved with replacement.  Mild hypophosphatemia.  Improved with replacement.  CKD stage IIIa.  Latest creatinine of 1.0.  We will continue to monitor closely.   Essential hypertension.  Continue Coreg.  Discontinue IV fluids.  Left BKA Continue supportive care   Leukocytosis.   Could be reactive.    Debility, deconditioning.  PT has recommended SNF.   DVT prophylaxis:  apixaban (ELIQUIS) tablet 5 mg   Code Status: Full code  Family Communication: None today.  Status is: Inpatient  Remains inpatient appropriate because:IV treatments appropriate due to intensity of illness or inability to take PO and Inpatient level of care appropriate due to severity of illness  Dispo: The patient is from: Home, lives with wife.              Anticipated d/c is to: Skilled nursing facility placement as per PT evaluation.  Patient currently is not medically stable to d/c.   Difficult to place patient No  Consultants: None  Procedures: None  Anti-infectives:  None  Anti-infectives (From admission, onward)    None      Subjective: Today, patient was seen and examined at bedside. No nausea vomiting or fever. Feels weak.    Objective: Vitals:   04/10/21 0332 04/10/21 0741  BP: (!) 157/83 (!) 147/85  Pulse: (!) 57 69  Resp: 19   Temp: 98.8 F (37.1 C)   SpO2: 100% 98%    Intake/Output Summary (Last 24 hours) at 04/10/2021 0848 Last data filed at 04/10/2021 [id number] Gross per 24 hour  Intake --  Output 925 ml  Net -925 ml    There were no vitals filed for this visit. There is no height or weight on file to calculate BMI.   Physical Exam:  General:  Average built, not in obvious distress HENT:   No scleral pallor or icterus noted. Oral mucosa is moist.  Chest:  Clear breath sounds.  Diminished breath sounds bilaterally. No crackles or wheezes.  CVS: S1 &S2 heard. No murmur.  Regular rate and rhythm. Abdomen: Soft, nontender, nondistended.  Bowel sounds are heard.   Extremities: Left below-knee amputation. Dry leg on the right side.  Psych: Alert, awake and oriented, normal mood CNS:  No cranial nerve deficits.  Power equal in all extremities.   Skin: Warm and dry.  No rashes noted.   Data Review: I have personally reviewed the following laboratory data and studies,  CBC: Recent Labs  Lab 04/06/21 2008 04/06/21 2243 04/08/21 0327 04/09/21 0350  WBC 24.6*  --  18.4* 12.7*  NEUTROABS 22.0*  --   --   --   HGB 17.9* 17.7* 17.6* 16.5  HCT 54.9* 52.0 53.1* 49.2  MCV 98.2  --  95.5 95.2  PLT 280  --  229 [id number]    Basic Metabolic Panel: Recent Labs  Lab 04/07/21 1504 04/07/21 1739 04/07/21 2118 04/08/21 0327 04/08/21 0840 04/09/21 0350  NA 143 144 144  --  141 140  K 3.9 4.3 3.2*  --  3.6 3.5  CL 112* 113* 114*  --  108 107  CO2 '22 22 23  '$ --  25 25  GLUCOSE 177* 151* 157*  --  251* 144*  BUN 25* 24* 22  --  19 19  CREATININE 1.25* 1.18 1.07  --  1.21 1.03  CALCIUM 9.3 9.2 8.8*  --  8.8* 8.6*  MG  --   --   --  1.8  --  1.7  PHOS  --   --   --  2.4*  --  3.2    Liver Function Tests: Recent Labs  Lab 04/06/21 2008  AST 34  ALT 17  ALKPHOS 90  BILITOT 1.5*  PROT 7.5   ALBUMIN 3.8    No results for input(s): LIPASE, AMYLASE in the last 168 hours. Recent Labs  Lab 04/06/21 2348  AMMONIA 9    Cardiac Enzymes: No results for input(s): CKTOTAL, CKMB, CKMBINDEX, TROPONINI in the last 168 hours. BNP (last 3 results) No results for input(s): BNP in the last 8760 hours.  ProBNP (last 3 results) No results for input(s): PROBNP in the last 8760 hours.  CBG: Recent Labs  Lab 04/09/21 0637 04/09/21 1143 04/09/21 1604 04/09/21 2131 04/10/21 0616  GLUCAP 136* 217* 96 141* 168*    Recent Results (from the past 240 hour(s))  Resp Panel by RT-PCR (Flu  A&B, Covid)     Status: None   Collection Time: 04/07/21 12:01 AM   Specimen: Nasopharyngeal(NP) swabs in vial transport medium  Result Value Ref Range Status   SARS Coronavirus 2 by RT PCR NEGATIVE NEGATIVE Final    Comment: (NOTE) SARS-CoV-2 target nucleic acids are NOT DETECTED.  The SARS-CoV-2 RNA is generally detectable in upper respiratory specimens during the acute phase of infection. The lowest concentration of SARS-CoV-2 viral copies this assay can detect is 138 copies/mL. A negative result does not preclude SARS-Cov-2 infection and should not be used as the sole basis for treatment or other patient management decisions. A negative result may occur with  improper specimen collection/handling, submission of specimen other than nasopharyngeal swab, presence of viral mutation(s) within the areas targeted by this assay, and inadequate number of viral copies(<138 copies/mL). A negative result must be combined with clinical observations, patient history, and epidemiological information. The expected result is Negative.  Fact Sheet for Patients:  EntrepreneurPulse.com.au  Fact Sheet for Healthcare Providers:  IncredibleEmployment.be  This test is no t yet approved or cleared by the [place] FDA and  has been authorized for detection and/or diagnosis  of SARS-CoV-2 by FDA under an Emergency Use Authorization (EUA). This EUA will remain  in effect (meaning this test can be used) for the duration of the COVID-19 declaration under Section 564(b)(1) of the Act, 21 U.S.C.section 360bbb-3(b)(1), unless the authorization is terminated  or revoked sooner.       Influenza A by PCR NEGATIVE NEGATIVE Final   Influenza B by PCR NEGATIVE NEGATIVE Final    Comment: (NOTE) The Xpert Xpress SARS-CoV-2/FLU/RSV plus assay is intended as an aid in the diagnosis of influenza from Nasopharyngeal swab specimens and should not be used as a sole basis for treatment. Nasal washings and aspirates are unacceptable for Xpert Xpress SARS-CoV-2/FLU/RSV testing.  Fact Sheet for Patients: EntrepreneurPulse.com.au  Fact Sheet for Healthcare Providers: IncredibleEmployment.be  This test is not yet approved or cleared by the [place] FDA and has been authorized for detection and/or diagnosis of SARS-CoV-2 by FDA under an Emergency Use Authorization (EUA). This EUA will remain in effect (meaning this test can be used) for the duration of the COVID-19 declaration under Section 564(b)(1) of the Act, 21 U.S.C. section 360bbb-3(b)(1), unless the authorization is terminated or revoked.  Performed at [facility], [place] [address] [place], [place] [address]   Culture, blood (routine x 2)     Status: None (Preliminary result)   Collection Time: 04/07/21  3:10 AM   Specimen: BLOOD  Result Value Ref Range Status   Specimen Description BLOOD LEFT HAND  Final   Special Requests   Final    BOTTLES DRAWN AEROBIC AND ANAEROBIC Blood Culture adequate volume   Culture   Final    NO GROWTH 2 DAYS Performed at [facility], [place] [address] [place], [place] [address]    Report Status PENDING  Incomplete  Culture, blood (Routine X 2) w Reflex to ID Panel     Status: Abnormal (Preliminary result)   Collection Time:  04/08/21  3:30 AM   Specimen: BLOOD  Result Value Ref Range Status   Specimen Description BLOOD LEFT ANTECUBITAL  Final   Special Requests   Final    BOTTLES DRAWN AEROBIC ONLY Blood Culture adequate volume   Culture  Setup Time   Final    GRAM POSITIVE COCCI IN CLUSTERS AEROBIC BOTTLE ONLY Organism ID to follow CRITICAL RESULT  CALLED TO, READ BACK BY AND VERIFIED WITHTillman Sers PHARMD [id number] 04/08/21 A BROWNING    Culture (A)  Final    STAPHYLOCOCCUS EPIDERMIDIS THE SIGNIFICANCE OF ISOLATING THIS ORGANISM FROM A SINGLE SET OF BLOOD CULTURES WHEN MULTIPLE SETS ARE DRAWN IS UNCERTAIN. PLEASE NOTIFY THE MICROBIOLOGY DEPARTMENT WITHIN ONE WEEK IF SPECIATION AND SENSITIVITIES ARE REQUIRED. Performed at [facility], [place] [address], [place], [place] [address]    Report Status PENDING  Incomplete  Blood Culture ID Panel (Reflexed)     Status: Abnormal   Collection Time: 04/08/21  3:30 AM  Result Value Ref Range Status   Enterococcus faecalis NOT DETECTED NOT DETECTED Final   Enterococcus Faecium NOT DETECTED NOT DETECTED Final   Listeria monocytogenes NOT DETECTED NOT DETECTED Final   Staphylococcus species DETECTED (A) NOT DETECTED Final    Comment: CRITICAL RESULT CALLED TO, READ BACK BY AND VERIFIED WITHTillman Sers PHARMD [id number] 04/08/21 A BROWNING    Staphylococcus aureus (BCID) NOT DETECTED NOT DETECTED Final   Staphylococcus epidermidis DETECTED (A) NOT DETECTED Final    Comment: Methicillin (oxacillin) resistant coagulase negative staphylococcus. Possible blood culture contaminant (unless isolated from more than one blood culture draw or clinical case suggests pathogenicity). No antibiotic treatment is indicated for blood  culture contaminants. CRITICAL RESULT CALLED TO, READ BACK BY AND VERIFIED WITH: Tillman Sers PHARMD [id number] 04/08/21 A BROWNING    Staphylococcus lugdunensis NOT DETECTED NOT DETECTED Final   Streptococcus species NOT DETECTED NOT DETECTED Final   Streptococcus  agalactiae NOT DETECTED NOT DETECTED Final   Streptococcus pneumoniae NOT DETECTED NOT DETECTED Final   Streptococcus pyogenes NOT DETECTED NOT DETECTED Final   A.calcoaceticus-baumannii NOT DETECTED NOT DETECTED Final   Bacteroides fragilis NOT DETECTED NOT DETECTED Final   Enterobacterales NOT DETECTED NOT DETECTED Final   Enterobacter cloacae complex NOT DETECTED NOT DETECTED Final   Escherichia coli NOT DETECTED NOT DETECTED Final   Klebsiella aerogenes NOT DETECTED NOT DETECTED Final   Klebsiella oxytoca NOT DETECTED NOT DETECTED Final   Klebsiella pneumoniae NOT DETECTED NOT DETECTED Final   Proteus species NOT DETECTED NOT DETECTED Final   Salmonella species NOT DETECTED NOT DETECTED Final   Serratia marcescens NOT DETECTED NOT DETECTED Final   Haemophilus influenzae NOT DETECTED NOT DETECTED Final   Neisseria meningitidis NOT DETECTED NOT DETECTED Final   Pseudomonas aeruginosa NOT DETECTED NOT DETECTED Final   Stenotrophomonas maltophilia NOT DETECTED NOT DETECTED Final   Candida albicans NOT DETECTED NOT DETECTED Final   Candida auris NOT DETECTED NOT DETECTED Final   Candida glabrata NOT DETECTED NOT DETECTED Final   Candida krusei NOT DETECTED NOT DETECTED Final   Candida parapsilosis NOT DETECTED NOT DETECTED Final   Candida tropicalis NOT DETECTED NOT DETECTED Final   Cryptococcus neoformans/gattii NOT DETECTED NOT DETECTED Final   Methicillin resistance mecA/C DETECTED (A) NOT DETECTED Final    Comment: CRITICAL RESULT CALLED TO, READ BACK BY AND VERIFIED WITHTillman Sers [facility] [id number] 04/08/21 A BROWNING Performed at [facility], [address], [place], [place] [address]       Studies: No results found.   Flora Lipps, MD  [facility] 04/10/2021  If 7PM-7AM, please contact night-coverage

## 2021-04-11 LAB — BASIC METABOLIC PANEL
Anion gap: 6 (ref 5–15)
BUN: 21 mg/dL (ref 8–23)
CO2: 30 mmol/L (ref 22–32)
Calcium: 8.4 mg/dL — ABNORMAL LOW (ref 8.9–10.3)
Chloride: 100 mmol/L (ref 98–111)
Creatinine, Ser: 1.22 mg/dL (ref 0.61–1.24)
GFR, Estimated: >60 mL/min (ref >60)
Glucose, Bld: 184 mg/dL — ABNORMAL HIGH (ref 70–99)
Potassium: 3.2 mmol/L — ABNORMAL LOW (ref 3.5–5.1)
Sodium: 136 mmol/L (ref 135–145)

## 2021-04-11 LAB — CBC
HCT: 47.5 % (ref 39.0–52.0)
Hemoglobin: 16.1 g/dL (ref 13.0–17.0)
MCH: 31.9 pg (ref 26.0–34.0)
MCHC: 33.9 g/dL (ref 30.0–36.0)
MCV: 94.2 fL (ref 80.0–100.0)
Platelets: 166 10*3/uL (ref 150–400)
RBC: 5.04 MIL/uL (ref 4.22–5.81)
RDW: 12.7 % (ref 11.5–15.5)
WBC: 10.1 10*3/uL (ref 4.0–10.5)
nRBC: 0 % (ref 0.0–0.2)

## 2021-04-11 LAB — GLUCOSE, CAPILLARY
Glucose-Capillary: 143 mg/dL — ABNORMAL HIGH (ref 70–99)
Glucose-Capillary: 182 mg/dL — ABNORMAL HIGH (ref 70–99)
Glucose-Capillary: 164 mg/dL — ABNORMAL HIGH (ref 70–99)
Glucose-Capillary: 113 mg/dL — ABNORMAL HIGH (ref 70–99)

## 2021-04-11 LAB — MAGNESIUM: Magnesium: 1.7 mg/dL (ref 1.7–2.4)

## 2021-04-11 MED ORDER — DOCUSATE SODIUM 100 MG PO CAPS
100.0000 mg | ORAL_CAPSULE | Freq: Two times a day (BID) | ORAL | Status: DC
  Administered 2021-04-11 – 2021-04-13 (×5): 100 mg via ORAL
  Filled 2021-04-11 (×5): qty 1

## 2021-04-11 MED ORDER — POTASSIUM CHLORIDE CRYS ER 20 MEQ PO TBCR
40.0000 meq | EXTENDED_RELEASE_TABLET | Freq: Once | ORAL | Status: AC
  Administered 2021-04-11: 40 meq via ORAL
  Filled 2021-04-11: qty 2

## 2021-04-11 NOTE — Progress Notes (Signed)
PROGRESS NOTE  EULUS QUINTERO [id number] DOB: December 01, 1955 DOA: 04/06/2021 PCP: Marco Collie, MD   LOS: 4 days   Brief narrative: Patient is a 65 years old male with past medical history of diabetes mellitus type 2, atrial fibrillation, history of peripheral vascular disease status post left BKA, CKD stage IIIa, congestive heart failure, presented to the hospital with confusion lethargy for several weeks with the strange behaviors and forgetfulness.  He was also progressively weak and wobbly.  He also had episodes of incontinence and poor oral intake.  Due to patient's worsening confusion and bizarre behavior patient was brought into the hospital.  His blood glucose levels were elevated at 518.  In the ED CT head scan was done which was negative for acute findings except for old left basal ganglia infarct.  Ammonia was 9.  Blood sugars were elevated with elevated anion gap suggestive of DKA.  Patient also was noted to have significant leukocytosis.  Patient was started on IV fluids, insulin drip and was admitted to hospital for further evaluation and treatment.  After hospitalization, patient's glycemic control has been adequate.  At this time awaiting for skilled nursing facility placement.  Assessment/Plan:  Principal Problem:   Acute metabolic encephalopathy Active Problems:   Essential hypertension   AF (paroxysmal atrial fibrillation) (HCC)   Chronic kidney disease, stage 3a (HCC)   Chronic diastolic CHF (congestive heart failure) (HCC)   Diabetic ketoacidosis without coma associated with type 2 diabetes mellitus ([place])   Leukocytosis   Mixed hyperlipidemia  Acute metabolic encephalopathy  likely secondary to uncontrolled hyperglycemia and DKA.  Blood glucose levels have improved with insulin drip and IV fluids.  TSH is within normal limits.  Folic acid low.  HIV was negative.  Urine drug screen was positive for tetrahydrocannabinol.  Blood cultures positive for staph epidermidis in  aerobic bottle likely contaminant.  We will continue to monitor off antibiotic.  Lactate was slightly elevated secondary to volume depletion .  COVID-19 and influenza negative.  Ammonia was 9.  UA showed hemoglobin and ketones but nitrite was negative.  Encephalopathy has significantly improved.  Diabetic ketoacidosis history of diabetes mellitus type 2.   Hemoglobin A1c of 12.9.  Has been transitioned to long-acting insulin and sliding scale insulin.    Diabetic coordinator on board.  Patient was emphasized the need for compliance with medication. POC of 143.  History of congestive heart failure.   Compensated at this time.   Paroxysmal atrial fibrillation.  Known ejection fraction of 55% with grade 1 diastolic dysfunction in [id number].  Currently on Coreg and Eliquis.  Currently rate controlled.  Mild hypokalemia.  We will continue to replenish.  Potassium was 3.2 today.  Mild hypophosphatemia.  Resolved.  CKD stage IIIa.  Latest creatinine of 1.0.  We will continue to monitor   Essential hypertension.  Continue Coreg.  Left BKA Continue supportive care   Leukocytosis.   Could be reactive.    Debility, deconditioning.  Physical therapy has recommended a skilled nursing facility placement on discharge.  DVT prophylaxis:  apixaban (ELIQUIS) tablet 5 mg   Code Status: Full code  Family Communication:  I spoke with the patient's daughter on the phone and updated her about the clinical condition of the patient.  Status is: Inpatient  Remains inpatient appropriate because:IV treatments appropriate due to intensity of illness or inability to take PO and Inpatient level of care appropriate due to severity of illness  Dispo: The patient is from: Home, lives with wife.  Anticipated d/c is to: Skilled nursing facility placement as per PT evaluation.              Patient currently is medically stable to d/c.   Difficult to place patient  No  Consultants: None  Procedures: None  Anti-infectives:  None  Anti-infectives (From admission, onward)    None      Subjective: Today, patient was seen and examined at bedside.  Denies any nausea vomiting fever chills or rigor.  He still has some constipation   Objective: Vitals:   04/11/21 0343 04/11/21 0832  BP: (!) 143/73 (!) 153/88  Pulse: 72 73  Resp: 20   Temp: 98.1 F (36.7 C) 97.6 F (36.4 C)  SpO2: 95% 98%    Intake/Output Summary (Last 24 hours) at 04/11/2021 1037 Last data filed at 04/11/2021 0656 Gross per 24 hour  Intake 300.52 ml  Output 2050 ml  Net -1749.48 ml    There were no vitals filed for this visit. There is no height or weight on file to calculate BMI.   Physical Exam: General:  Average built, not in obvious distress HENT:   No scleral pallor or icterus noted. Oral mucosa is moist.  Chest:  Clear breath sounds.  Diminished breath sounds bilaterally. No crackles or wheezes.  CVS: S1 &S2 heard. No murmur.  Regular rate and rhythm. Abdomen: Soft, nontender, nondistended.  Bowel sounds are heard.   Extremities: Left below-knee amputation. Psych: Alert, awake and oriented, normal mood CNS:  No cranial nerve deficits.  Power equal in all extremities.   Skin: Warm and dry.  No rashes noted.   Data Review: I have personally reviewed the following laboratory data and studies,  CBC: Recent Labs  Lab 04/06/21 2008 04/06/21 2243 04/08/21 0327 04/09/21 0350 04/11/21 0246  WBC 24.6*  --  18.4* 12.7* 10.1  NEUTROABS 22.0*  --   --   --   --   HGB 17.9* 17.7* 17.6* 16.5 16.1  HCT 54.9* 52.0 53.1* 49.2 47.5  MCV 98.2  --  95.5 95.2 94.2  PLT 280  --  229 182 [id number]    Basic Metabolic Panel: Recent Labs  Lab 04/07/21 1739 04/07/21 2118 04/08/21 0327 04/08/21 0840 04/09/21 0350 04/11/21 0246  NA 144 144  --  141 140 136  K 4.3 3.2*  --  3.6 3.5 3.2*  CL 113* 114*  --  108 107 100  CO2 22 23  --  '25 25 30  '$ GLUCOSE 151* 157*  --   251* 144* 184*  BUN 24* 22  --  '19 19 21  '$ CREATININE 1.18 1.07  --  1.21 1.03 1.22  CALCIUM 9.2 8.8*  --  8.8* 8.6* 8.4*  MG  --   --  1.8  --  1.7 1.7  PHOS  --   --  2.4*  --  3.2  --     Liver Function Tests: Recent Labs  Lab 04/06/21 2008  AST 34  ALT 17  ALKPHOS 90  BILITOT 1.5*  PROT 7.5  ALBUMIN 3.8    No results for input(s): LIPASE, AMYLASE in the last 168 hours. Recent Labs  Lab 04/06/21 2348  AMMONIA 9    Cardiac Enzymes: No results for input(s): CKTOTAL, CKMB, CKMBINDEX, TROPONINI in the last 168 hours. BNP (last 3 results) No results for input(s): BNP in the last 8760 hours.  ProBNP (last 3 results) No results for input(s): PROBNP in the last 8760 hours.  CBG: Recent Labs  Lab 04/10/21 0616 04/10/21 1123 04/10/21 1606 04/10/21 2202 04/11/21 0630  GLUCAP 168* 182* 172* 133* 143*    Recent Results (from the past 240 hour(s))  Resp Panel by RT-PCR (Flu A&B, Covid)     Status: None   Collection Time: 04/07/21 12:01 AM   Specimen: Nasopharyngeal(NP) swabs in vial transport medium  Result Value Ref Range Status   SARS Coronavirus 2 by RT PCR NEGATIVE NEGATIVE Final    Comment: (NOTE) SARS-CoV-2 target nucleic acids are NOT DETECTED.  The SARS-CoV-2 RNA is generally detectable in upper respiratory specimens during the acute phase of infection. The lowest concentration of SARS-CoV-2 viral copies this assay can detect is 138 copies/mL. A negative result does not preclude SARS-Cov-2 infection and should not be used as the sole basis for treatment or other patient management decisions. A negative result may occur with  improper specimen collection/handling, submission of specimen other than nasopharyngeal swab, presence of viral mutation(s) within the areas targeted by this assay, and inadequate number of viral copies(<138 copies/mL). A negative result must be combined with clinical observations, patient history, and epidemiological information. The  expected result is Negative.  Fact Sheet for Patients:  EntrepreneurPulse.com.au  Fact Sheet for Healthcare Providers:  IncredibleEmployment.be  This test is no t yet approved or cleared by the [place] FDA and  has been authorized for detection and/or diagnosis of SARS-CoV-2 by FDA under an Emergency Use Authorization (EUA). This EUA will remain  in effect (meaning this test can be used) for the duration of the COVID-19 declaration under Section 564(b)(1) of the Act, 21 U.S.C.section 360bbb-3(b)(1), unless the authorization is terminated  or revoked sooner.       Influenza A by PCR NEGATIVE NEGATIVE Final   Influenza B by PCR NEGATIVE NEGATIVE Final    Comment: (NOTE) The Xpert Xpress SARS-CoV-2/FLU/RSV plus assay is intended as an aid in the diagnosis of influenza from Nasopharyngeal swab specimens and should not be used as a sole basis for treatment. Nasal washings and aspirates are unacceptable for Xpert Xpress SARS-CoV-2/FLU/RSV testing.  Fact Sheet for Patients: EntrepreneurPulse.com.au  Fact Sheet for Healthcare Providers: IncredibleEmployment.be  This test is not yet approved or cleared by the [place] FDA and has been authorized for detection and/or diagnosis of SARS-CoV-2 by FDA under an Emergency Use Authorization (EUA). This EUA will remain in effect (meaning this test can be used) for the duration of the COVID-19 declaration under Section 564(b)(1) of the Act, 21 U.S.C. section 360bbb-3(b)(1), unless the authorization is terminated or revoked.  Performed at [facility], [place] [address] [place], [place] [address]   Culture, blood (routine x 2)     Status: None (Preliminary result)   Collection Time: 04/07/21  3:10 AM   Specimen: BLOOD  Result Value Ref Range Status   Specimen Description BLOOD LEFT HAND  Final   Special Requests   Final    BOTTLES DRAWN AEROBIC AND  ANAEROBIC Blood Culture adequate volume   Culture   Final    NO GROWTH 4 DAYS Performed at [facility], [place] [address] [place], [place] [address]    Report Status PENDING  Incomplete  Culture, blood (Routine X 2) w Reflex to ID Panel     Status: Abnormal   Collection Time: 04/08/21  3:30 AM   Specimen: BLOOD  Result Value Ref Range Status   Specimen Description BLOOD LEFT ANTECUBITAL  Final   Special Requests   Final    BOTTLES DRAWN  AEROBIC ONLY Blood Culture adequate volume   Culture  Setup Time   Final    GRAM POSITIVE COCCI IN CLUSTERS AEROBIC BOTTLE ONLY Organism ID to follow CRITICAL RESULT CALLED TO, READ BACK BY AND VERIFIED WITHTillman Sers PHARMD [id number] 04/08/21 A BROWNING    Culture (A)  Final    STAPHYLOCOCCUS EPIDERMIDIS THE SIGNIFICANCE OF ISOLATING THIS ORGANISM FROM A SINGLE SET OF BLOOD CULTURES WHEN MULTIPLE SETS ARE DRAWN IS UNCERTAIN. PLEASE NOTIFY THE MICROBIOLOGY DEPARTMENT WITHIN ONE WEEK IF SPECIATION AND SENSITIVITIES ARE REQUIRED. Performed at [facility], [place] [address], [place], [place] [address]    Report Status 04/10/2021 FINAL  Final  Blood Culture ID Panel (Reflexed)     Status: Abnormal   Collection Time: 04/08/21  3:30 AM  Result Value Ref Range Status   Enterococcus faecalis NOT DETECTED NOT DETECTED Final   Enterococcus Faecium NOT DETECTED NOT DETECTED Final   Listeria monocytogenes NOT DETECTED NOT DETECTED Final   Staphylococcus species DETECTED (A) NOT DETECTED Final    Comment: CRITICAL RESULT CALLED TO, READ BACK BY AND VERIFIED WITHTillman Sers PHARMD [id number] 04/08/21 A BROWNING    Staphylococcus aureus (BCID) NOT DETECTED NOT DETECTED Final   Staphylococcus epidermidis DETECTED (A) NOT DETECTED Final    Comment: Methicillin (oxacillin) resistant coagulase negative staphylococcus. Possible blood culture contaminant (unless isolated from more than one blood culture draw or clinical case suggests pathogenicity). No antibiotic treatment  is indicated for blood  culture contaminants. CRITICAL RESULT CALLED TO, READ BACK BY AND VERIFIED WITH: Tillman Sers PHARMD [id number] 04/08/21 A BROWNING    Staphylococcus lugdunensis NOT DETECTED NOT DETECTED Final   Streptococcus species NOT DETECTED NOT DETECTED Final   Streptococcus agalactiae NOT DETECTED NOT DETECTED Final   Streptococcus pneumoniae NOT DETECTED NOT DETECTED Final   Streptococcus pyogenes NOT DETECTED NOT DETECTED Final   A.calcoaceticus-baumannii NOT DETECTED NOT DETECTED Final   Bacteroides fragilis NOT DETECTED NOT DETECTED Final   Enterobacterales NOT DETECTED NOT DETECTED Final   Enterobacter cloacae complex NOT DETECTED NOT DETECTED Final   Escherichia coli NOT DETECTED NOT DETECTED Final   Klebsiella aerogenes NOT DETECTED NOT DETECTED Final   Klebsiella oxytoca NOT DETECTED NOT DETECTED Final   Klebsiella pneumoniae NOT DETECTED NOT DETECTED Final   Proteus species NOT DETECTED NOT DETECTED Final   Salmonella species NOT DETECTED NOT DETECTED Final   Serratia marcescens NOT DETECTED NOT DETECTED Final   Haemophilus influenzae NOT DETECTED NOT DETECTED Final   Neisseria meningitidis NOT DETECTED NOT DETECTED Final   Pseudomonas aeruginosa NOT DETECTED NOT DETECTED Final   Stenotrophomonas maltophilia NOT DETECTED NOT DETECTED Final   Candida albicans NOT DETECTED NOT DETECTED Final   Candida auris NOT DETECTED NOT DETECTED Final   Candida glabrata NOT DETECTED NOT DETECTED Final   Candida krusei NOT DETECTED NOT DETECTED Final   Candida parapsilosis NOT DETECTED NOT DETECTED Final   Candida tropicalis NOT DETECTED NOT DETECTED Final   Cryptococcus neoformans/gattii NOT DETECTED NOT DETECTED Final   Methicillin resistance mecA/C DETECTED (A) NOT DETECTED Final    Comment: CRITICAL RESULT CALLED TO, READ BACK BY AND VERIFIED WITHTillman Sers [facility] [id number] 04/08/21 A BROWNING Performed at [facility], [address] [place], [place] [address]        Studies: No results found.   Flora Lipps, MD  [facility] 04/11/2021  If 7PM-7AM, please contact night-coverage

## 2021-04-11 NOTE — Care Management Important Message (Signed)
Important Message  Patient Details  Name: Brian Martin MRN: [id number] Date of Birth: August 14, 1956   Medicare Important Message Given:  Yes     [name] 04/11/2021, 2:13 PM

## 2021-04-12 LAB — CULTURE, BLOOD (ROUTINE X 2)
Culture: NO GROWTH
Special Requests: ADEQUATE

## 2021-04-12 LAB — SARS CORONAVIRUS 2 (TAT 6-24 HRS): SARS Coronavirus 2: NEGATIVE

## 2021-04-12 LAB — GLUCOSE, CAPILLARY
Glucose-Capillary: 144 mg/dL — ABNORMAL HIGH (ref 70–99)
Glucose-Capillary: 266 mg/dL — ABNORMAL HIGH (ref 70–99)
Glucose-Capillary: 281 mg/dL — ABNORMAL HIGH (ref 70–99)

## 2021-04-12 NOTE — Progress Notes (Signed)
Physical Therapy Treatment Patient Details Name: GIANNA SPRANGER MRN: [id number] DOB: 30-May-1956 Today's Date: 04/12/2021    History of Present Illness 65 y/o male admitted 8/10 secondary to [place]. Thought to be secondary to acute metabolic encephalopathy and DKA. PMH includes HTN, CKD, a fib, dCHF, DM, and L BKA.    PT Comments    Pt with functional improvement today but still with significant cognitive deficits making him very unsafe with mobility. Pt donned prosthesis with increased time and redirection to task many times due to easy distractability. Pt stood with min A +2 to stabilize with transition and pivoted to recliner with unsafe use of RW. Pt was able to ambulate 20' with RW and chair nearby for safety. Continue to recommend SNF for rehab before going home. PT will continue to follow.    Follow Up Recommendations  SNF;Supervision/Assistance - 24 hour     Equipment Recommendations  Other (comment) (TBD)    Recommendations for Other Services       Precautions / Restrictions Precautions Precautions: Fall Precaution Comments: L BKA; prosthetic in room Restrictions Weight Bearing Restrictions: No    Mobility  Bed Mobility Overal bed mobility: Needs Assistance Bed Mobility: Supine to Sit   Sidelying to sit: Supervision Supine to sit: Min guard     General bed mobility comments: Min Guard A for safety, increased time needed    Transfers Overall transfer level: Needs assistance Equipment used: Rolling walker (2 wheeled) Transfers: Sit to/from [facility] Sit to Stand: Min assist;+2 safety/equipment Stand pivot transfers: Min assist;+2 safety/equipment Squat pivot transfers: Min assist     General transfer comment: Min A for gaining balance in standing, pushes RW out in front of him. Min A to manage RW and to control descent to chair  Ambulation/Gait Ambulation/Gait assistance: Min assist Gait Distance (Feet): 20 Feet Assistive device: Rolling  walker (2 wheeled) Gait Pattern/deviations: Step-through pattern;Trunk flexed Gait velocity: decreased Gait velocity interpretation: <1.8 ft/sec, indicate of risk for recurrent falls General Gait Details: pt keeps RW ahead of him and maintains R lean away from prosthesis. Min A needed for safety due to pt's decreased awareness   Stairs             Wheelchair Mobility    Modified Rankin (Stroke Patients Only)       Balance Overall balance assessment: History of Falls;Needs assistance Sitting-balance support: No upper extremity supported;Feet supported Sitting balance-Leahy Scale: Good     Standing balance support: During functional activity;Bilateral upper extremity supported Standing balance-Leahy Scale: Poor                              Cognition Arousal/Alertness: Awake/alert Behavior During Therapy: Flat affect Overall Cognitive Status: Impaired/Different from baseline Area of Impairment: Orientation;Attention;Memory;Safety/judgement;Awareness;Problem solving                 Orientation Level: Disoriented to;Time Current Attention Level: Sustained (Requiring quiet environment to sustain attention to task) Memory: Decreased short-term memory   Safety/Judgement: Decreased awareness of safety;Decreased awareness of deficits Awareness: Intellectual Problem Solving: Slow processing General Comments: Pt with decreased processing; difficulty problem solving answers to questions during conversation. When donning brace, pt requiring simple, direct cues to sequence (unable to complete task with questioning cues). Pt with poor awareness of deficits and the impact of those on his performance and safety.      Exercises      General Comments General comments (skin integrity, edema,  etc.): pt cannot focus with distractions. Want to go home but agreeable to rehab first. 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 She is staying with their  daughter      Pertinent Vitals/Pain Pain Assessment: No/denies pain Faces Pain Scale: No hurt Pain Intervention(s): Monitored during session;Repositioned    Home Living                      Prior Function            PT Goals (current goals can now be found in the care plan section) Acute Rehab PT Goals Patient Stated Goal: per family to get some rehab PT Goal Formulation: Patient unable to participate in goal setting Time For Goal Achievement: 04/22/21 Potential to Achieve Goals: Fair Progress towards PT goals: Progressing toward goals    Frequency    Min 2X/week      PT Plan Current plan remains appropriate    Co-evaluation   Reason for Co-Treatment: For patient/therapist safety;To address functional/ADL transfers   OT goals addressed during session: ADL's and self-care      AM-PAC PT "6 Clicks" Mobility   Outcome Measure  Help needed turning from your back to your side while in a flat bed without using bedrails?: A Little Help needed moving from lying on your back to sitting on the side of a flat bed without using bedrails?: A Little Help needed moving to and from a bed to a chair (including a wheelchair)?: A Lot Help needed standing up from a chair using your arms (e.g., wheelchair or bedside chair)?: A Lot Help needed to walk in hospital room?: A Lot Help needed climbing 3-5 steps with a railing? : Total 6 Click Score: 13    End of Session Equipment Utilized During Treatment: Gait belt Activity Tolerance: Patient tolerated treatment well Patient left: with call bell/phone within reach;in chair;with chair alarm set Nurse Communication: Mobility status PT Visit Diagnosis: Unsteadiness on feet (R26.81);Muscle weakness (generalized) (M62.81);Difficulty in walking, not elsewhere classified (R26.2)     Time: 1030-1056 PT Time Calculation (min) (ACUTE ONLY): 26 min  Charges:  $Gait Training: 8-22 mins                     Leighton Roach, [place]  Pager [phone] Office [place] 04/12/2021, 11:36 AM

## 2021-04-12 NOTE — Progress Notes (Signed)
PROGRESS NOTE  Brian Martin [id number] DOB: 11/25/1955 DOA: 04/06/2021 PCP: Marco Collie, MD   LOS: 5 days   Brief narrative:  Patient is a 65 years old male with past medical history of diabetes mellitus type 2, atrial fibrillation, history of peripheral vascular disease status post left BKA, CKD stage IIIa, congestive heart failure, presented to the hospital with confusion lethargy for several weeks with the strange behaviors and forgetfulness.  Patient was also progressively weak and wobbly.  He also had episodes of incontinence and poor oral intake.  Due to patient's worsening confusion and bizarre behavior, patient was brought into the hospital.  His blood glucose levels were elevated at 518.  In the ED, CT head scan was done which was negative for acute findings except for old left basal ganglia infarct.  Ammonia was 9.  Blood sugars were elevated with elevated anion gap suggestive of DKA.  Patient also was noted to have significant leukocytosis.  Patient was started on IV fluids, insulin drip and was admitted to hospital for further evaluation and treatment.  After hospitalization, patient's glycemic control has been adequate.  At this time awaiting for skilled nursing facility placement.  Assessment/Plan:  Principal Problem:   Acute metabolic encephalopathy Active Problems:   Essential hypertension   AF (paroxysmal atrial fibrillation) (HCC)   Chronic kidney disease, stage 3a (HCC)   Chronic diastolic CHF (congestive heart failure) (HCC)   Diabetic ketoacidosis without coma associated with type 2 diabetes mellitus ([place])   Leukocytosis   Mixed hyperlipidemia  Acute metabolic encephalopathy  Resolved at this time.  Secondary to uncontrolled hyperglycemia and DKA.    Diabetic ketoacidosis history of diabetes mellitus type 2.   Hemoglobin A1c of 12.9.  On long-acting insulin ,sliding scale insulin and mealtime insulin.  Point-of-care glucose of 144.  History of congestive  heart failure.   Compensated at this time.   Paroxysmal atrial fibrillation.  Known ejection fraction of 55% with grade 1 diastolic dysfunction in [id number].  Currently on Coreg and Eliquis.  Currently rate controlled.  Mild hypokalemia.  Replenished.  Check levels in a.m.  Mild hypophosphatemia.  Resolved.  CKD stage IIIa.  Latest creatinine of 1.0.  We will continue to monitor   Essential hypertension.  Continue Coreg.  Left BKA Continue supportive care   Leukocytosis.   Could be reactive.    Debility, deconditioning.  Physical therapy has recommended a skilled nursing facility placement on discharge.  DVT prophylaxis:  apixaban (ELIQUIS) tablet 5 mg   Code Status: Full code  Family Communication:  None today.  I spoke with the patient's daughter on the phone and updated her about the clinical condition of the patient yesterday.  Status is: Inpatient  Remains inpatient appropriate because:IV treatments appropriate due to intensity of illness or inability to take PO and Inpatient level of care appropriate due to severity of illness  Dispo: The patient is from: Home, lives with wife.              Anticipated d/c is to: Skilled nursing facility placement as per PT evaluation.              Patient currently is medically stable to d/c.   Difficult to place patient No  Consultants: None  Procedures: None  Anti-infectives:  None  Anti-infectives (From admission, onward)    None      Subjective: Today, patient was seen and examined bedside.  Denies any nausea vomiting fever chills or rigor.  Feels okay  Objective: Vitals:   04/12/21 0826 04/12/21 1145  BP: (!) 152/82 (!) 164/86  Pulse: 74 72  Resp:  16  Temp: 97.6 F (36.4 C) 98.1 F (36.7 C)  SpO2: 98% 97%    Intake/Output Summary (Last 24 hours) at 04/12/2021 1421 Last data filed at 04/12/2021 0500 Gross per 24 hour  Intake --  Output 1900 ml  Net -1900 ml    There were no vitals filed for this  visit. There is no height or weight on file to calculate BMI.   Physical Exam: General:  Average built, not in obvious distress HENT:   No scleral pallor or icterus noted. Oral mucosa is moist.  Chest:  Clear breath sounds.  Diminished breath sounds bilaterally. No crackles or wheezes.  CVS: S1 &S2 heard. No murmur.  Regular rate and rhythm. Abdomen: Soft, nontender, nondistended.  Bowel sounds are heard.   Extremities: Left below-knee amputation. Psych: Alert, awake and oriented, normal mood CNS:  No cranial nerve deficits.  Power equal in all extremities.   Skin: Warm and dry.  No rashes noted.   Data Review: I have personally reviewed the following laboratory data and studies,  CBC: Recent Labs  Lab 04/06/21 2008 04/06/21 2243 04/08/21 0327 04/09/21 0350 04/11/21 0246  WBC 24.6*  --  18.4* 12.7* 10.1  NEUTROABS 22.0*  --   --   --   --   HGB 17.9* 17.7* 17.6* 16.5 16.1  HCT 54.9* 52.0 53.1* 49.2 47.5  MCV 98.2  --  95.5 95.2 94.2  PLT 280  --  229 182 [id number]    Basic Metabolic Panel: Recent Labs  Lab 04/07/21 1739 04/07/21 2118 04/08/21 0327 04/08/21 0840 04/09/21 0350 04/11/21 0246  NA 144 144  --  141 140 136  K 4.3 3.2*  --  3.6 3.5 3.2*  CL 113* 114*  --  108 107 100  CO2 22 23  --  '25 25 30  '$ GLUCOSE 151* 157*  --  251* 144* 184*  BUN 24* 22  --  '19 19 21  '$ CREATININE 1.18 1.07  --  1.21 1.03 1.22  CALCIUM 9.2 8.8*  --  8.8* 8.6* 8.4*  MG  --   --  1.8  --  1.7 1.7  PHOS  --   --  2.4*  --  3.2  --     Liver Function Tests: Recent Labs  Lab 04/06/21 2008  AST 34  ALT 17  ALKPHOS 90  BILITOT 1.5*  PROT 7.5  ALBUMIN 3.8    No results for input(s): LIPASE, AMYLASE in the last 168 hours. Recent Labs  Lab 04/06/21 2348  AMMONIA 9    Cardiac Enzymes: No results for input(s): CKTOTAL, CKMB, CKMBINDEX, TROPONINI in the last 168 hours. BNP (last 3 results) No results for input(s): BNP in the last 8760 hours.  ProBNP (last 3 results) No results  for input(s): PROBNP in the last 8760 hours.  CBG: Recent Labs  Lab 04/11/21 0630 04/11/21 1224 04/11/21 1646 04/11/21 2138 04/12/21 0619  GLUCAP 143* 182* 164* 113* 144*    Recent Results (from the past 240 hour(s))  Resp Panel by RT-PCR (Flu A&B, Covid)     Status: None   Collection Time: 04/07/21 12:01 AM   Specimen: Nasopharyngeal(NP) swabs in vial transport medium  Result Value Ref Range Status   SARS Coronavirus 2 by RT PCR NEGATIVE NEGATIVE Final    Comment: (NOTE) SARS-CoV-2 target nucleic acids are NOT DETECTED.  The  SARS-CoV-2 RNA is generally detectable in upper respiratory specimens during the acute phase of infection. The lowest concentration of SARS-CoV-2 viral copies this assay can detect is 138 copies/mL. A negative result does not preclude SARS-Cov-2 infection and should not be used as the sole basis for treatment or other patient management decisions. A negative result may occur with  improper specimen collection/handling, submission of specimen other than nasopharyngeal swab, presence of viral mutation(s) within the areas targeted by this assay, and inadequate number of viral copies(<138 copies/mL). A negative result must be combined with clinical observations, patient history, and epidemiological information. The expected result is Negative.  Fact Sheet for Patients:  EntrepreneurPulse.com.au  Fact Sheet for Healthcare Providers:  IncredibleEmployment.be  This test is no t yet approved or cleared by the [place] FDA and  has been authorized for detection and/or diagnosis of SARS-CoV-2 by FDA under an Emergency Use Authorization (EUA). This EUA will remain  in effect (meaning this test can be used) for the duration of the COVID-19 declaration under Section 564(b)(1) of the Act, 21 U.S.C.section 360bbb-3(b)(1), unless the authorization is terminated  or revoked sooner.       Influenza A by PCR NEGATIVE  NEGATIVE Final   Influenza B by PCR NEGATIVE NEGATIVE Final    Comment: (NOTE) The Xpert Xpress SARS-CoV-2/FLU/RSV plus assay is intended as an aid in the diagnosis of influenza from Nasopharyngeal swab specimens and should not be used as a sole basis for treatment. Nasal washings and aspirates are unacceptable for Xpert Xpress SARS-CoV-2/FLU/RSV testing.  Fact Sheet for Patients: EntrepreneurPulse.com.au  Fact Sheet for Healthcare Providers: IncredibleEmployment.be  This test is not yet approved or cleared by the [place] FDA and has been authorized for detection and/or diagnosis of SARS-CoV-2 by FDA under an Emergency Use Authorization (EUA). This EUA will remain in effect (meaning this test can be used) for the duration of the COVID-19 declaration under Section 564(b)(1) of the Act, 21 U.S.C. section 360bbb-3(b)(1), unless the authorization is terminated or revoked.  Performed at [facility], [place] [address], [place], [place] [address]   Culture, blood (routine x 2)     Status: None (Preliminary result)   Collection Time: 04/07/21  3:10 AM   Specimen: BLOOD  Result Value Ref Range Status   Specimen Description BLOOD LEFT HAND  Final   Special Requests   Final    BOTTLES DRAWN AEROBIC AND ANAEROBIC Blood Culture adequate volume   Culture   Final    NO GROWTH 4 DAYS Performed at [facility], [place] [address], [place], [place] [address]    Report Status PENDING  Incomplete  Culture, blood (Routine X 2) w Reflex to ID Panel     Status: Abnormal   Collection Time: 04/08/21  3:30 AM   Specimen: BLOOD  Result Value Ref Range Status   Specimen Description BLOOD LEFT ANTECUBITAL  Final   Special Requests   Final    BOTTLES DRAWN AEROBIC ONLY Blood Culture adequate volume   Culture  Setup Time   Final    GRAM POSITIVE COCCI IN CLUSTERS AEROBIC BOTTLE ONLY Organism ID to follow CRITICAL RESULT CALLED TO, READ BACK BY AND  VERIFIED WITHTillman Sers PHARMD [id number] 04/08/21 A BROWNING    Culture (A)  Final    STAPHYLOCOCCUS EPIDERMIDIS THE SIGNIFICANCE OF ISOLATING THIS ORGANISM FROM A SINGLE SET OF BLOOD CULTURES WHEN MULTIPLE SETS ARE DRAWN IS UNCERTAIN. PLEASE NOTIFY THE MICROBIOLOGY DEPARTMENT WITHIN ONE WEEK IF SPECIATION  AND SENSITIVITIES ARE REQUIRED. Performed at [facility], [place] [address] [place], [place] [address]    Report Status 04/10/2021 FINAL  Final  Blood Culture ID Panel (Reflexed)     Status: Abnormal   Collection Time: 04/08/21  3:30 AM  Result Value Ref Range Status   Enterococcus faecalis NOT DETECTED NOT DETECTED Final   Enterococcus Faecium NOT DETECTED NOT DETECTED Final   Listeria monocytogenes NOT DETECTED NOT DETECTED Final   Staphylococcus species DETECTED (A) NOT DETECTED Final    Comment: CRITICAL RESULT CALLED TO, READ BACK BY AND VERIFIED WITHTillman Sers PHARMD [id number] 04/08/21 A BROWNING    Staphylococcus aureus (BCID) NOT DETECTED NOT DETECTED Final   Staphylococcus epidermidis DETECTED (A) NOT DETECTED Final    Comment: Methicillin (oxacillin) resistant coagulase negative staphylococcus. Possible blood culture contaminant (unless isolated from more than one blood culture draw or clinical case suggests pathogenicity). No antibiotic treatment is indicated for blood  culture contaminants. CRITICAL RESULT CALLED TO, READ BACK BY AND VERIFIED WITH: Tillman Sers PHARMD [id number] 04/08/21 A BROWNING    Staphylococcus lugdunensis NOT DETECTED NOT DETECTED Final   Streptococcus species NOT DETECTED NOT DETECTED Final   Streptococcus agalactiae NOT DETECTED NOT DETECTED Final   Streptococcus pneumoniae NOT DETECTED NOT DETECTED Final   Streptococcus pyogenes NOT DETECTED NOT DETECTED Final   A.calcoaceticus-baumannii NOT DETECTED NOT DETECTED Final   Bacteroides fragilis NOT DETECTED NOT DETECTED Final   Enterobacterales NOT DETECTED NOT DETECTED Final   Enterobacter cloacae complex NOT  DETECTED NOT DETECTED Final   Escherichia coli NOT DETECTED NOT DETECTED Final   Klebsiella aerogenes NOT DETECTED NOT DETECTED Final   Klebsiella oxytoca NOT DETECTED NOT DETECTED Final   Klebsiella pneumoniae NOT DETECTED NOT DETECTED Final   Proteus species NOT DETECTED NOT DETECTED Final   Salmonella species NOT DETECTED NOT DETECTED Final   Serratia marcescens NOT DETECTED NOT DETECTED Final   Haemophilus influenzae NOT DETECTED NOT DETECTED Final   Neisseria meningitidis NOT DETECTED NOT DETECTED Final   Pseudomonas aeruginosa NOT DETECTED NOT DETECTED Final   Stenotrophomonas maltophilia NOT DETECTED NOT DETECTED Final   Candida albicans NOT DETECTED NOT DETECTED Final   Candida auris NOT DETECTED NOT DETECTED Final   Candida glabrata NOT DETECTED NOT DETECTED Final   Candida krusei NOT DETECTED NOT DETECTED Final   Candida parapsilosis NOT DETECTED NOT DETECTED Final   Candida tropicalis NOT DETECTED NOT DETECTED Final   Cryptococcus neoformans/gattii NOT DETECTED NOT DETECTED Final   Methicillin resistance mecA/C DETECTED (A) NOT DETECTED Final    Comment: CRITICAL RESULT CALLED TO, READ BACK BY AND VERIFIED WITHTillman Sers [facility] [id number] 04/08/21 A BROWNING Performed at [facility], [address], [place], [place] [address]       Studies: No results found.   Flora Lipps, MD  [facility] 04/12/2021  If 7PM-7AM, please contact night-coverage

## 2021-04-12 NOTE — Discharge Instructions (Signed)

## 2021-04-12 NOTE — TOC Progression Note (Signed)
Transition of Care [facility]) - Progression Note    Patient Details  Name: Brian Martin MRN: [id number] Date of Birth: [birth year]  Transition of Care [facility]) CM/SW [place], [place] Phone Number: 04/12/2021, 3:43 PM  Clinical Narrative:   CSW received a voicemail from patient's daughter indicating that [facility] was still their first choice, but [facility] would be second choice if [facility] was a no. CSW reached out to [facility] to review referral and they have denied to offer a bed for the patient, they do not have any long term beds available. CSW confirmed bed availability at [facility]. CSW updated patient's daughter and initiated insurance authorization request. CSW to follow.    Expected Discharge Plan: [place] Barriers to Discharge: Continued Medical Work up, SNF Pending bed offer, Ship broker  Expected Discharge Plan and Services Expected Discharge Plan: [place]   Discharge Planning Services: CM Consult Post Acute Care Choice: [place] Living arrangements for the past 2 months: Single Family Home                                       Social Determinants of Health (SDOH) Interventions    Readmission Risk Interventions No flowsheet data found.

## 2021-04-12 NOTE — TOC Progression Note (Signed)
Transition of Care [facility]) - Progression Note    Patient Details  Name: Brian Martin MRN: [id number] Date of Birth: 03-04-1956  Transition of Care [facility]) CM/SW [place], [place] Phone Number: 04/12/2021, 3:42 PM  Clinical Narrative:   CSW spoke with patient's daughter, Ned Grace, to provide bed offers. Ned Grace also asked about [facility] in [place], and said that she would discuss the bed offers with her mom and research them because she wasn't familiar with them. CSW sent referral to [facility], asked them to review. CSW to follow.    Expected Discharge Plan: [place] Barriers to Discharge: Continued Medical Work up, SNF Pending bed offer, Ship broker  Expected Discharge Plan and Services Expected Discharge Plan: [place]   Discharge Planning Services: CM Consult Post Acute Care Choice: [place] Living arrangements for the past 2 months: Single Family Home                                       Social Determinants of Health (SDOH) Interventions    Readmission Risk Interventions No flowsheet data found.

## 2021-04-12 NOTE — Progress Notes (Signed)
Occupational Therapy Treatment Patient Details Name: Brian Martin MRN: [id number] DOB: 1955-11-28 Today's Date: 04/12/2021    History of present illness 65 y/o male admitted 8/10 secondary to [place]. Thought to be secondary to acute metabolic encephalopathy and DKA. PMH includes HTN, CKD, a fib, dCHF, DM, and L BKA.   OT comments  Pt progressing towards established OT goals. Continues to present with decreased cognition, balance, strength, and activity tolerance impacting his safe performance of ADLs.  Pt requiring increased time and cues for donning prosthetic and shoes while sitting at EOB. Pt performing functional mobility with Min A +2 and RW. Continue to recommend dc to SNF and will continue to follow acutely as admitted.    Follow Up Recommendations  SNF    Equipment Recommendations  None recommended by OT    Recommendations for Other Services      Precautions / Restrictions Precautions Precautions: Fall Precaution Comments: L BKA; prosthetic in room       Mobility Bed Mobility Overal bed mobility: Needs Assistance Bed Mobility: Supine to Sit     Supine to sit: Min guard     General bed mobility comments: Min Guard A for safety    Transfers Overall transfer level: Needs assistance Equipment used: Rolling walker (2 wheeled) Transfers: Sit to/from Stand     Squat pivot transfers: Min assist     General transfer comment: Min A for gaining balance in standing    Balance Overall balance assessment: History of Falls;Needs assistance Sitting-balance support: No upper extremity supported;Feet supported Sitting balance-Leahy Scale: Good     Standing balance support: During functional activity;Bilateral upper extremity supported Standing balance-Leahy Scale: Poor                             ADL either performed or assessed with clinical judgement   ADL Overall ADL's : Needs assistance/impaired                     Lower Body Dressing:  Minimal assistance;Sit to/from stand;Cueing for compensatory techniques;Cueing for sequencing Lower Body Dressing Details (indicate cue type and reason): Requiring cues for sequencing donning of prosthetic and then to safely don shoe and tie shoe             Functional mobility during ADLs: Minimal assistance;Rolling walker General ADL Comments: Pt demonstrating increased strength and activity tolerance. Still present with poor cognition, balance, and awareness impacting his completion and safe performance of ADLs.     Vision   Additional Comments: poor vision per daughter   Perception     Praxis      Cognition Arousal/Alertness: Awake/alert Behavior During Therapy: Flat affect Overall Cognitive Status: Impaired/Different from baseline Area of Impairment: Orientation;Attention;Memory;Safety/judgement;Awareness;Problem solving                 Orientation Level: Disoriented to;Time Current Attention Level: Sustained (Requiring quiet environment to sustain attention to task) Memory: Decreased short-term memory   Safety/Judgement: Decreased awareness of safety;Decreased awareness of deficits Awareness: Intellectual Problem Solving: Slow processing General Comments: Pt with decreased processing; difficulty problem solving answers to questions during conversation. When donning brace, pt requiring simple, direct cues to sequence (unabel to compelte task with questioning cues). Pt with poor awareness of deficits and the impact of those on his performance and safety.        Exercises     Shoulder Instructions       General Comments prothestic in  room and worn during session    Pertinent Vitals/ Pain       Pain Assessment: Faces Faces Pain Scale: No hurt Pain Intervention(s): Monitored during session;Repositioned  Home Living                                          Prior Functioning/Environment              Frequency  Min 2X/week         Progress Toward Goals  OT Goals(current goals can now be found in the care plan section)  Progress towards OT goals: Progressing toward goals  Acute Rehab OT Goals Patient Stated Goal: per family to get some rehab OT Goal Formulation: With family Time For Goal Achievement: 04/23/21 Potential to Achieve Goals: Fair ADL Goals Pt Will Perform Upper Body Bathing: with set-up Pt Will Perform Lower Body Bathing: with supervision;sit to/from stand;sitting/lateral leans Pt Will Transfer to Toilet: with supervision;bedside commode;squat pivot transfer Pt Will Perform Toileting - Clothing Manipulation and hygiene: with supervision;sitting/lateral leans  Plan Discharge plan remains appropriate    Co-evaluation    PT/OT/SLP Co-Evaluation/Treatment: Yes Reason for Co-Treatment: For patient/therapist safety;To address functional/ADL transfers   OT goals addressed during session: ADL's and self-care      AM-PAC OT "6 Clicks" Daily Activity     Outcome Measure   Help from another person eating meals?: None Help from another person taking care of personal grooming?: A Little Help from another person toileting, which includes using toliet, bedpan, or urinal?: A Lot Help from another person bathing (including washing, rinsing, drying)?: A Lot Help from another person to put on and taking off regular upper body clothing?: A Little Help from another person to put on and taking off regular lower body clothing?: A Little 6 Click Score: 17    End of Session Equipment Utilized During Treatment: Gait belt;Rolling walker  OT Visit Diagnosis: Other abnormalities of gait and mobility (R26.89);Muscle weakness (generalized) (M62.81);Low vision, both eyes (H54.2);Other symptoms and signs involving cognitive function   Activity Tolerance Patient tolerated treatment well   Patient Left in chair;with call bell/phone within reach;with chair alarm set   Nurse Communication Mobility status         Time: [id number] OT Time Calculation (min): 23 min  Charges: OT General Charges $OT Visit: 1 Visit OT Treatments $Self Care/Home Management : 8-22 mins  [place], OTR/L Acute Rehab Pager: [phone] Office: [employer] 04/12/2021, 11:24 AM

## 2021-04-13 DIAGNOSIS — Z794 Long term (current) use of insulin: Secondary | ICD-10-CM | POA: Diagnosis not present

## 2021-04-13 DIAGNOSIS — E1169 Type 2 diabetes mellitus with other specified complication: Secondary | ICD-10-CM | POA: Diagnosis not present

## 2021-04-13 DIAGNOSIS — K5900 Constipation, unspecified: Secondary | ICD-10-CM | POA: Diagnosis not present

## 2021-04-13 DIAGNOSIS — E1165 Type 2 diabetes mellitus with hyperglycemia: Secondary | ICD-10-CM | POA: Diagnosis not present

## 2021-04-13 DIAGNOSIS — R410 Disorientation, unspecified: Secondary | ICD-10-CM | POA: Diagnosis not present

## 2021-04-13 DIAGNOSIS — Z89512 Acquired absence of left leg below knee: Secondary | ICD-10-CM | POA: Diagnosis not present

## 2021-04-13 DIAGNOSIS — J45909 Unspecified asthma, uncomplicated: Secondary | ICD-10-CM | POA: Diagnosis not present

## 2021-04-13 DIAGNOSIS — M6281 Muscle weakness (generalized): Secondary | ICD-10-CM | POA: Diagnosis not present

## 2021-04-13 DIAGNOSIS — R2681 Unsteadiness on feet: Secondary | ICD-10-CM | POA: Diagnosis not present

## 2021-04-13 DIAGNOSIS — R404 Transient alteration of awareness: Secondary | ICD-10-CM | POA: Diagnosis not present

## 2021-04-13 DIAGNOSIS — R300 Dysuria: Secondary | ICD-10-CM | POA: Diagnosis not present

## 2021-04-13 DIAGNOSIS — I10 Essential (primary) hypertension: Secondary | ICD-10-CM | POA: Diagnosis not present

## 2021-04-13 DIAGNOSIS — E46 Unspecified protein-calorie malnutrition: Secondary | ICD-10-CM | POA: Diagnosis not present

## 2021-04-13 DIAGNOSIS — G9341 Metabolic encephalopathy: Secondary | ICD-10-CM | POA: Diagnosis not present

## 2021-04-13 DIAGNOSIS — N1831 Chronic kidney disease, stage 3a: Secondary | ICD-10-CM | POA: Diagnosis not present

## 2021-04-13 DIAGNOSIS — I5032 Chronic diastolic (congestive) heart failure: Secondary | ICD-10-CM | POA: Diagnosis not present

## 2021-04-13 DIAGNOSIS — D72829 Elevated white blood cell count, unspecified: Secondary | ICD-10-CM | POA: Diagnosis not present

## 2021-04-13 DIAGNOSIS — I739 Peripheral vascular disease, unspecified: Secondary | ICD-10-CM | POA: Diagnosis not present

## 2021-04-13 DIAGNOSIS — E782 Mixed hyperlipidemia: Secondary | ICD-10-CM | POA: Diagnosis not present

## 2021-04-13 DIAGNOSIS — Z743 Need for continuous supervision: Secondary | ICD-10-CM | POA: Diagnosis not present

## 2021-04-13 DIAGNOSIS — I480 Paroxysmal atrial fibrillation: Secondary | ICD-10-CM | POA: Diagnosis not present

## 2021-04-13 DIAGNOSIS — N390 Urinary tract infection, site not specified: Secondary | ICD-10-CM | POA: Diagnosis not present

## 2021-04-13 DIAGNOSIS — E1110 Type 2 diabetes mellitus with ketoacidosis without coma: Secondary | ICD-10-CM | POA: Diagnosis not present

## 2021-04-13 DIAGNOSIS — Z8719 Personal history of other diseases of the digestive system: Secondary | ICD-10-CM | POA: Diagnosis not present

## 2021-04-13 DIAGNOSIS — E1159 Type 2 diabetes mellitus with other circulatory complications: Secondary | ICD-10-CM | POA: Diagnosis not present

## 2021-04-13 LAB — BASIC METABOLIC PANEL
Anion gap: 7 (ref 5–15)
BUN: 20 mg/dL (ref 8–23)
CO2: 27 mmol/L (ref 22–32)
Calcium: 8.7 mg/dL — ABNORMAL LOW (ref 8.9–10.3)
Chloride: 102 mmol/L (ref 98–111)
Creatinine, Ser: 1.25 mg/dL — ABNORMAL HIGH (ref 0.61–1.24)
GFR, Estimated: >60 mL/min (ref >60)
Glucose, Bld: 188 mg/dL — ABNORMAL HIGH (ref 70–99)
Potassium: 3.6 mmol/L (ref 3.5–5.1)
Sodium: 136 mmol/L (ref 135–145)

## 2021-04-13 LAB — GLUCOSE, CAPILLARY
Glucose-Capillary: 186 mg/dL — ABNORMAL HIGH (ref 70–99)
Glucose-Capillary: 219 mg/dL — ABNORMAL HIGH (ref 70–99)

## 2021-04-13 LAB — CBC
HCT: 45.8 % (ref 39.0–52.0)
Hemoglobin: 15.5 g/dL (ref 13.0–17.0)
MCH: 32.1 pg (ref 26.0–34.0)
MCHC: 33.8 g/dL (ref 30.0–36.0)
MCV: 94.8 fL (ref 80.0–100.0)
Platelets: 166 10*3/uL (ref 150–400)
RBC: 4.83 MIL/uL (ref 4.22–5.81)
RDW: 12.7 % (ref 11.5–15.5)
WBC: 13.3 10*3/uL — ABNORMAL HIGH (ref 4.0–10.5)
nRBC: 0 % (ref 0.0–0.2)

## 2021-04-13 LAB — MAGNESIUM: Magnesium: 1.8 mg/dL (ref 1.7–2.4)

## 2021-04-13 MED ORDER — INSULIN ASPART 100 UNIT/ML IJ SOLN: 3.0000 [IU] | Freq: Three times a day (TID) | INTRAMUSCULAR | Status: DC

## 2021-04-13 MED ORDER — INSULIN GLARGINE 100 UNIT/ML ~~LOC~~ SOLN: 25.0000 [IU] | Freq: Every day | SUBCUTANEOUS | Status: DC

## 2021-04-13 NOTE — TOC Transition Note (Signed)
Transition of Care [facility]) - CM/SW Discharge Note   Patient Details  Name: Brian Martin MRN: [id number] Date of Birth: 1956-07-16  Transition of Care [facility]) CM/SW Contact:  Geralynn Ochs, LCSW Phone Number: 04/13/2021, 10:21 AM   Clinical Narrative:   Nurse to call report to [phone], Room 110.    Final next level of care: Skilled Nursing Facility Barriers to Discharge: Barriers Resolved   Patient Goals and CMS Choice Patient states their goals for this hospitalization and ongoing recovery are:: Pt unable to participate in goal setting due to disorientation. CMS Medicare.gov Compare Post Acute Care list provided to:: Patient Represenative (must comment) (Daughter)    Discharge Placement              Patient chooses bed at: [facility] Patient to be transferred to facility by: [place] Name of family member notified: Ned Grace Patient and family notified of of transfer: 04/13/21  Discharge Plan and Services   Discharge Planning Services: CM Consult Post Acute Care Choice: [place]                               Social Determinants of Health (SDOH) Interventions     Readmission Risk Interventions No flowsheet data found.

## 2021-04-13 NOTE — Discharge Summary (Addendum)
Physician Discharge Summary  EDLEY OH [id number] DOB: 02/11/1956 DOA: 04/06/2021  PCP: Marco Collie, MD  Admit date: 04/06/2021 Discharge date: 04/13/2021  Admitted From: Home  Discharge disposition: Skilled nursing facility  Recommendations for Outpatient Follow-Up:   Follow up with your primary care provider at the skilled nursing facility in 3 to 5 days Check CBC, BMP, magnesium in the next visit Patient will need continued insulin to prevent from DKA and hyperglycemia.  Please adjust insulin regimen as necessary.  Discharge Diagnosis:   Principal Problem:   Acute metabolic encephalopathy Active Problems:   Essential hypertension   AF (paroxysmal atrial fibrillation) (HCC)   Chronic kidney disease, stage 3a (HCC)   Chronic diastolic CHF (congestive heart failure) (HCC)   Diabetic ketoacidosis without coma associated with type 2 diabetes mellitus ([place])   Leukocytosis   Mixed hyperlipidemia  Discharge Condition: Improved.  Diet recommendation: Low sodium, heart healthy.  Carbohydrate-modified.    Wound care: None.  Code status: Full.  History of Present Illness:   Patient is a 65 years old male with past medical history of diabetes mellitus type 2, atrial fibrillation, history of peripheral vascular disease status post left BKA, CKD stage IIIa, congestive heart failure, presented to the hospital with confusion lethargy for several weeks with the strange behaviors and forgetfulness.  Patient was also progressively weak and wobbly.  He also had episodes of incontinence and poor oral intake.  Due to patient's worsening confusion and bizarre behavior, patient was brought into the hospital.  His blood glucose levels were elevated at 518.  In the ED, CT head scan was done which was negative for acute findings except for old left basal ganglia infarct.  Ammonia was 9.  Blood sugars were elevated with elevated anion gap suggestive of DKA.  Patient also was noted to have  significant leukocytosis.  Patient was started on IV fluids, insulin drip and was admitted to hospital for further evaluation and treatment.   Hospital Course:   Following conditions were addressed during hospitalization as listed below,  Acute metabolic encephalopathy  Resolved at this time.  Secondary to uncontrolled hyperglycemia and DKA.  Will need better glycemic control as outpatient.   Diabetic ketoacidosis history of diabetes mellitus type 2.   Hemoglobin A1c of 12.9.  Insulin dose will be increased to 25 units at bedtime plus prandial insulin will be added.  He will need to be closely monitored and insulin regimen adjusted as necessary.    History of congestive heart failure.   Compensated at this time.  On Coreg, Lasix, lisinopril.   Paroxysmal atrial fibrillation.  Known ejection fraction of 55% with grade 1 diastolic dysfunction in [id number].  Currently on Coreg and Eliquis. Rate controlled.   Mild hypokalemia.  Replenished.  Potassium prior to discharge was 3.6.   Mild hypophosphatemia.  Resolved.  CKD stage IIIa.  Latest creatinine of 1.2.  Will need outpatient monitoring.   Essential hypertension.  Continue Coreg and lisinopril..  Left BKA Continue supportive care   Leukocytosis.   Likely reactive and mild.  No obvious source of infection.   Debility, deconditioning.  Physical therapy has recommended a skilled nursing facility placement on discharge.  Disposition.  At this time, patient is stable for disposition to skilled nursing facility.  Will need to follow-up with his primary care physician as outpatient .  I also spoke with the patient daughter on the phone and updated her about t plan for disposition and the plan for follow-up as outpatient.  Medical Consultants:   None.  Procedures:    None Subjective:   Today, patient was seen and examined at bedside.  Denies any interval complaints.  Denies any nausea, vomiting, fever, chills or rigor.  Discharge Exam:    Vitals:   04/13/21 0810 04/13/21 0826  BP: (!) 143/93   Pulse: 81   Resp: 18   Temp: 98.3 F (36.8 C)   SpO2: 100% 97%   Vitals:   04/13/21 0011 04/13/21 0346 04/13/21 0810 04/13/21 0826  BP: 136/69 140/75 (!) 143/93   Pulse: 77 75 81   Resp: '18 17 18   '$ Temp: 98.5 F (36.9 C) 97.7 F (36.5 C) 98.3 F (36.8 C)   TempSrc: Oral Oral Oral   SpO2: 97% 97% 100% 97%   General: Alert awake, not in obvious distress HENT: pupils equally reacting to light,  No scleral pallor or icterus noted. Oral mucosa is moist.  Chest:  Clear breath sounds.  Diminished breath sounds bilaterally. No crackles or wheezes.  CVS: S1 &S2 heard. No murmur.  Regular rate and rhythm. Abdomen: Soft, nontender, nondistended.  Bowel sounds are heard.   Extremities: Left below-knee amputation. Psych: Alert, awake and oriented, normal mood CNS:  No cranial nerve deficits.  Moving extremities.  Nonfocal. Skin: Warm and dry.  No rashes noted.  The results of significant diagnostics from this hospitalization (including imaging, microbiology, ancillary and laboratory) are listed below for reference.     Diagnostic Studies:   DG Chest 2 View  Result Date: 04/06/2021 CLINICAL DATA:  Altered mental status EXAM: CHEST - 2 VIEW COMPARISON:  09/22/2016 FINDINGS: Mild bronchitic changes. No focal consolidation or effusion. Normal cardiac size with aortic atherosclerosis. No pneumothorax. IMPRESSION: No active cardiopulmonary disease.  Bronchitic changes Electronically Signed   By: Donavan Foil M.D.   On: 04/06/2021 21:24   CT HEAD WO CONTRAST (5MM)  Result Date: 04/06/2021 CLINICAL DATA:  Altered mental status EXAM: CT HEAD WITHOUT CONTRAST TECHNIQUE: Contiguous axial images were obtained from the base of the skull through the vertex without intravenous contrast. COMPARISON:  None. FINDINGS: Brain: No evidence of acute infarction, hemorrhage, hydrocephalus, extra-axial collection or mass lesion/mass effect. Left basal  ganglia lacunar infarct (series 3/image 20), likely chronic. Subcortical white matter and periventricular small vessel ischemic changes. Vascular: Intracranial atherosclerosis. Skull: Normal. Negative for fracture or focal lesion. Sinuses/Orbits: The visualized paranasal sinuses are essentially clear. The mastoid air cells are unopacified. Other: None. IMPRESSION: No evidence of acute intracranial abnormality. Old left basal ganglia lacunar infarct. Small vessel ischemic changes. Electronically Signed   By: Julian Hy M.D.   On: 04/06/2021 22:52   CT ABDOMEN PELVIS W CONTRAST  Result Date: 04/07/2021 CLINICAL DATA:  65 year old male with diarrhea. EXAM: CT ABDOMEN AND PELVIS WITH CONTRAST TECHNIQUE: Multidetector CT imaging of the abdomen and pelvis was performed using the standard protocol following bolus administration of intravenous contrast. CONTRAST:  82m OMNIPAQUE IOHEXOL 300 MG/ML  SOLN COMPARISON:  None. FINDINGS: Lower chest: Calcified coronary artery atherosclerosis. No cardiomegaly. There is mild pericardial wall calcification on series 5, image 13. No pericardial effusion. Mild lung base atelectasis or scarring. Hepatobiliary: Small layering gallstones (series 3, image 33). No pericholecystic inflammation. Tiny 10 mm subcapsular hypodensity at the liver dome is likely a benign cyst. Small calcified granuloma also at the posterior dome. Otherwise negative liver. Pancreas: Atrophied. Spleen: Diminutive, negative. Adrenals/Urinary Tract: Normal adrenal glands. Nonobstructed kidneys. Renal vascular calcifications. No definite nephrolithiasis. Bilateral renal cortical thinning with patchy perinephric stranding  which is likely chronic. Decompressed ureters. Mildly distended but otherwise unremarkable bladder. Stomach/Bowel: There is intermittent mild diverticulosis of the large bowel, including at the hepatic flexure. But no active large bowel inflammation is identified. Only mild retained stool.  Normal appendix on series 3, image 70. Decompressed and negative terminal ileum. No dilated small bowel. Negative stomach and duodenum. No free air, free fluid, mesenteric inflammation. Vascular/Lymphatic: Extensive Aortoiliac calcified atherosclerosis. Suboptimal intravascular contrast bolus but the major arterial structures appear to be patent. Portal venous system appears patent. No lymphadenopathy. Reproductive: Negative. Other: No pelvic free fluid. Musculoskeletal: No acute osseous abnormality identified. A portion of the left upper extremity is included, and at the left antecubital fossa an oval 27 x 65 x 66 mm (AP by transverse by CC) collection of extravasated contrast is visible, for an estimated volume of 58 mL. IMPRESSION: 1. No acute or inflammatory process identified in the abdomen or pelvis. Occasional large bowel diverticula without active inflammation. Cholelithiasis without CT evidence of acute cholecystitis. Calcified coronary artery and Aortic Atherosclerosis (ICD10-I70.0). Mild pericardial calcification without pericardial effusion. Chronic medical renal disease is suspected. 2. Visible IV contrast extravasation at the left antecubital fossa with an estimated volume of 58 mL. This should resolve with conservative treatment (including ice, elevation). Recommend monitoring for any skin changes over the next several days. Electronically Signed   By: Genevie Ann M.D.   On: 04/07/2021 07:14     Labs:   Basic Metabolic Panel: Recent Labs  Lab 04/07/21 2118 04/08/21 0327 04/08/21 0840 04/09/21 0350 04/11/21 0246 04/13/21 0056  NA 144  --  141 140 136 136  K 3.2*  --  3.6 3.5 3.2* 3.6  CL 114*  --  108 107 100 102  CO2 23  --  '25 25 30 27  '$ GLUCOSE 157*  --  251* 144* 184* 188*  BUN 22  --  '19 19 21 20  '$ CREATININE 1.07  --  1.21 1.03 1.22 1.25*  CALCIUM 8.8*  --  8.8* 8.6* 8.4* 8.7*  MG  --  1.8  --  1.7 1.7 1.8  PHOS  --  2.4*  --  3.2  --   --    GFR CrCl cannot be calculated  (Unknown ideal weight.). Liver Function Tests: Recent Labs  Lab 04/06/21 2008  AST 34  ALT 17  ALKPHOS 90  BILITOT 1.5*  PROT 7.5  ALBUMIN 3.8   No results for input(s): LIPASE, AMYLASE in the last 168 hours. Recent Labs  Lab 04/06/21 2348  AMMONIA 9   Coagulation profile Recent Labs  Lab 04/08/21 0327  INR 1.3*    CBC: Recent Labs  Lab 04/06/21 2008 04/06/21 2243 04/08/21 0327 04/09/21 0350 04/11/21 0246 04/13/21 0056  WBC 24.6*  --  18.4* 12.7* 10.1 13.3*  NEUTROABS 22.0*  --   --   --   --   --   HGB 17.9* 17.7* 17.6* 16.5 16.1 15.5  HCT 54.9* 52.0 53.1* 49.2 47.5 45.8  MCV 98.2  --  95.5 95.2 94.2 94.8  PLT 280  --  229 182 166 166   Cardiac Enzymes: No results for input(s): CKTOTAL, CKMB, CKMBINDEX, TROPONINI in the last 168 hours. BNP: Invalid input(s): POCBNP CBG: Recent Labs  Lab 04/11/21 2138 04/12/21 0619 04/12/21 1656 04/12/21 2142 04/13/21 0640  GLUCAP 113* 144* 266* 281* 186*   D-Dimer No results for input(s): DDIMER in the last 72 hours. Hgb A1c No results for input(s): HGBA1C in the last  72 hours. Lipid Profile No results for input(s): CHOL, HDL, LDLCALC, TRIG, CHOLHDL, LDLDIRECT in the last 72 hours. Thyroid function studies No results for input(s): TSH, T4TOTAL, T3FREE, THYROIDAB in the last 72 hours.  Invalid input(s): FREET3 Anemia work up No results for input(s): VITAMINB12, FOLATE, FERRITIN, TIBC, IRON, RETICCTPCT in the last 72 hours. Microbiology Recent Results (from the past 240 hour(s))  Resp Panel by RT-PCR (Flu A&B, Covid)     Status: None   Collection Time: 04/07/21 12:01 AM   Specimen: Nasopharyngeal(NP) swabs in vial transport medium  Result Value Ref Range Status   SARS Coronavirus 2 by RT PCR NEGATIVE NEGATIVE Final    Comment: (NOTE) SARS-CoV-2 target nucleic acids are NOT DETECTED.  The SARS-CoV-2 RNA is generally detectable in upper respiratory specimens during the acute phase of infection. The  lowest concentration of SARS-CoV-2 viral copies this assay can detect is 138 copies/mL. A negative result does not preclude SARS-Cov-2 infection and should not be used as the sole basis for treatment or other patient management decisions. A negative result may occur with  improper specimen collection/handling, submission of specimen other than nasopharyngeal swab, presence of viral mutation(s) within the areas targeted by this assay, and inadequate number of viral copies(<138 copies/mL). A negative result must be combined with clinical observations, patient history, and epidemiological information. The expected result is Negative.  Fact Sheet for Patients:  EntrepreneurPulse.com.au  Fact Sheet for Healthcare Providers:  IncredibleEmployment.be  This test is no t yet approved or cleared by the [place] FDA and  has been authorized for detection and/or diagnosis of SARS-CoV-2 by FDA under an Emergency Use Authorization (EUA). This EUA will remain  in effect (meaning this test can be used) for the duration of the COVID-19 declaration under Section 564(b)(1) of the Act, 21 U.S.C.section 360bbb-3(b)(1), unless the authorization is terminated  or revoked sooner.       Influenza A by PCR NEGATIVE NEGATIVE Final   Influenza B by PCR NEGATIVE NEGATIVE Final    Comment: (NOTE) The Xpert Xpress SARS-CoV-2/FLU/RSV plus assay is intended as an aid in the diagnosis of influenza from Nasopharyngeal swab specimens and should not be used as a sole basis for treatment. Nasal washings and aspirates are unacceptable for Xpert Xpress SARS-CoV-2/FLU/RSV testing.  Fact Sheet for Patients: EntrepreneurPulse.com.au  Fact Sheet for Healthcare Providers: IncredibleEmployment.be  This test is not yet approved or cleared by the [place] FDA and has been authorized for detection and/or diagnosis of SARS-CoV-2 by FDA under  an Emergency Use Authorization (EUA). This EUA will remain in effect (meaning this test can be used) for the duration of the COVID-19 declaration under Section 564(b)(1) of the Act, 21 U.S.C. section 360bbb-3(b)(1), unless the authorization is terminated or revoked.  Performed at [facility], [place] [address], [place], [place] [address]   Culture, blood (routine x 2)     Status: None   Collection Time: 04/07/21  3:10 AM   Specimen: BLOOD  Result Value Ref Range Status   Specimen Description BLOOD LEFT HAND  Final   Special Requests   Final    BOTTLES DRAWN AEROBIC AND ANAEROBIC Blood Culture adequate volume   Culture   Final    NO GROWTH 5 DAYS Performed at [facility], [place] [address] [place], [place] [address]    Report Status 04/12/2021 FINAL  Final  Culture, blood (Routine X 2) w Reflex to ID Panel     Status: Abnormal   Collection  Time: 04/08/21  3:30 AM   Specimen: BLOOD  Result Value Ref Range Status   Specimen Description BLOOD LEFT ANTECUBITAL  Final   Special Requests   Final    BOTTLES DRAWN AEROBIC ONLY Blood Culture adequate volume   Culture  Setup Time   Final    GRAM POSITIVE COCCI IN CLUSTERS AEROBIC BOTTLE ONLY Organism ID to follow CRITICAL RESULT CALLED TO, READ BACK BY AND VERIFIED WITHTillman Sers PHARMD [id number] 04/08/21 A BROWNING    Culture (A)  Final    STAPHYLOCOCCUS EPIDERMIDIS THE SIGNIFICANCE OF ISOLATING THIS ORGANISM FROM A SINGLE SET OF BLOOD CULTURES WHEN MULTIPLE SETS ARE DRAWN IS UNCERTAIN. PLEASE NOTIFY THE MICROBIOLOGY DEPARTMENT WITHIN ONE WEEK IF SPECIATION AND SENSITIVITIES ARE REQUIRED. Performed at [facility], [place] [address] [place], [place] [address]    Report Status 04/10/2021 FINAL  Final  Blood Culture ID Panel (Reflexed)     Status: Abnormal   Collection Time: 04/08/21  3:30 AM  Result Value Ref Range Status   Enterococcus faecalis NOT DETECTED NOT DETECTED Final   Enterococcus Faecium NOT DETECTED NOT DETECTED  Final   Listeria monocytogenes NOT DETECTED NOT DETECTED Final   Staphylococcus species DETECTED (A) NOT DETECTED Final    Comment: CRITICAL RESULT CALLED TO, READ BACK BY AND VERIFIED WITHTillman Sers PHARMD [id number] 04/08/21 A BROWNING    Staphylococcus aureus (BCID) NOT DETECTED NOT DETECTED Final   Staphylococcus epidermidis DETECTED (A) NOT DETECTED Final    Comment: Methicillin (oxacillin) resistant coagulase negative staphylococcus. Possible blood culture contaminant (unless isolated from more than one blood culture draw or clinical case suggests pathogenicity). No antibiotic treatment is indicated for blood  culture contaminants. CRITICAL RESULT CALLED TO, READ BACK BY AND VERIFIED WITH: Tillman Sers PHARMD [id number] 04/08/21 A BROWNING    Staphylococcus lugdunensis NOT DETECTED NOT DETECTED Final   Streptococcus species NOT DETECTED NOT DETECTED Final   Streptococcus agalactiae NOT DETECTED NOT DETECTED Final   Streptococcus pneumoniae NOT DETECTED NOT DETECTED Final   Streptococcus pyogenes NOT DETECTED NOT DETECTED Final   A.calcoaceticus-baumannii NOT DETECTED NOT DETECTED Final   Bacteroides fragilis NOT DETECTED NOT DETECTED Final   Enterobacterales NOT DETECTED NOT DETECTED Final   Enterobacter cloacae complex NOT DETECTED NOT DETECTED Final   Escherichia coli NOT DETECTED NOT DETECTED Final   Klebsiella aerogenes NOT DETECTED NOT DETECTED Final   Klebsiella oxytoca NOT DETECTED NOT DETECTED Final   Klebsiella pneumoniae NOT DETECTED NOT DETECTED Final   Proteus species NOT DETECTED NOT DETECTED Final   Salmonella species NOT DETECTED NOT DETECTED Final   Serratia marcescens NOT DETECTED NOT DETECTED Final   Haemophilus influenzae NOT DETECTED NOT DETECTED Final   Neisseria meningitidis NOT DETECTED NOT DETECTED Final   Pseudomonas aeruginosa NOT DETECTED NOT DETECTED Final   Stenotrophomonas maltophilia NOT DETECTED NOT DETECTED Final   Candida albicans NOT DETECTED NOT DETECTED Final    Candida auris NOT DETECTED NOT DETECTED Final   Candida glabrata NOT DETECTED NOT DETECTED Final   Candida krusei NOT DETECTED NOT DETECTED Final   Candida parapsilosis NOT DETECTED NOT DETECTED Final   Candida tropicalis NOT DETECTED NOT DETECTED Final   Cryptococcus neoformans/gattii NOT DETECTED NOT DETECTED Final   Methicillin resistance mecA/C DETECTED (A) NOT DETECTED Final    Comment: CRITICAL RESULT CALLED TO, READ BACK BY AND VERIFIED WITHTillman Sers [facility] [id number] 04/08/21 A BROWNING Performed at [facility], [address], [place], [place] [address]  SARS CORONAVIRUS 2 (TAT 6-24 HRS) Nasopharyngeal Nasopharyngeal Swab     Status: None   Collection Time: 04/12/21  2:53 PM   Specimen: Nasopharyngeal Swab  Result Value Ref Range Status   SARS Coronavirus 2 NEGATIVE NEGATIVE Final    Comment: (NOTE) SARS-CoV-2 target nucleic acids are NOT DETECTED.  The SARS-CoV-2 RNA is generally detectable in upper and lower respiratory specimens during the acute phase of infection. Negative results do not preclude SARS-CoV-2 infection, do not rule out co-infections with other pathogens, and should not be used as the sole basis for treatment or other patient management decisions. Negative results must be combined with clinical observations, patient history, and epidemiological information. The expected result is Negative.  Fact Sheet for Patients: SugarRoll.be  Fact Sheet for Healthcare Providers: https://www.woods-mathews.com/  This test is not yet approved or cleared by the [place] FDA and  has been authorized for detection and/or diagnosis of SARS-CoV-2 by FDA under an Emergency Use Authorization (EUA). This EUA will remain  in effect (meaning this test can be used) for the duration of the COVID-19 declaration under Se ction 564(b)(1) of the Act, 21 U.S.C. section 360bbb-3(b)(1), unless the authorization is terminated or revoked  sooner.  Performed at [facility], [place] [address], [place], [place] [address]      Discharge Instructions:   Discharge Instructions     Diet - low sodium heart healthy   Complete by: As directed    Diet Carb Modified   Complete by: As directed    Discharge instructions   Complete by: As directed    Follow-up with your primary care provider at the skilled nursing facility in 3 to 5 days.  Check blood work at that time.  Continue to take insulin as prescribed.   Increase activity slowly   Complete by: As directed       Allergies as of 04/13/2021   No Known Allergies      Medication List     TAKE these medications    albuterol (2.5 MG/3ML) 0.083% nebulizer solution Commonly known as: PROVENTIL Take 2.5 mg by nebulization every 6 (six) hours as needed for wheezing or shortness of breath.   apixaban 5 MG Tabs tablet Commonly known as: Eliquis Take 1 tablet (5 mg total) by mouth 2 (two) times daily. NEEDS APPOINTMENT FOR FUTURE REFILLS   budesonide-formoterol 160-4.5 MCG/ACT inhaler Commonly known as: SYMBICORT Inhale 2 puffs into the lungs 2 (two) times daily.   carvedilol 25 MG tablet Commonly known as: COREG Take 25 mg by mouth 2 (two) times daily with a meal.   docusate sodium 100 MG capsule Commonly known as: COLACE Take 1 capsule (100 mg total) by mouth 2 (two) times daily.   furosemide 20 MG tablet Commonly known as: LASIX Take 1 tablet (20 mg total) by mouth daily.   insulin aspart 100 UNIT/ML injection Commonly known as: novoLOG Inject 3 Units into the skin 3 (three) times daily with meals.   insulin glargine 100 UNIT/ML injection Commonly known as: LANTUS Inject 0.25 mLs (25 Units total) into the skin at bedtime. What changed: how much to take   lisinopril 10 MG tablet Commonly known as: ZESTRIL Take 10 mg by mouth daily.   pantoprazole 40 MG tablet Commonly known as: Protonix Take 1 tablet (40 mg total) by mouth daily.   sertraline  25 MG tablet Commonly known as: ZOLOFT Take 25 mg by mouth daily.   simvastatin 10 MG tablet Commonly known as: ZOCOR  Take 10 mg by mouth daily.   vitamin C 100 MG tablet Take 100 mg by mouth daily.        Time coordinating discharge: 39 minutes  Signed:  Laniesha Das  [facility] 04/13/2021, 9:27 AM

## 2021-04-13 NOTE — Progress Notes (Signed)
Called report to [facility] at [facility] at 10:52-awaiting transport-pt aware

## 2021-04-18 DIAGNOSIS — E1159 Type 2 diabetes mellitus with other circulatory complications: Secondary | ICD-10-CM | POA: Diagnosis not present

## 2021-04-18 DIAGNOSIS — I10 Essential (primary) hypertension: Secondary | ICD-10-CM | POA: Diagnosis not present

## 2021-04-19 DIAGNOSIS — E1165 Type 2 diabetes mellitus with hyperglycemia: Secondary | ICD-10-CM | POA: Diagnosis not present

## 2021-04-19 DIAGNOSIS — R410 Disorientation, unspecified: Secondary | ICD-10-CM | POA: Diagnosis not present

## 2021-04-19 DIAGNOSIS — D72829 Elevated white blood cell count, unspecified: Secondary | ICD-10-CM | POA: Diagnosis not present

## 2021-04-25 DIAGNOSIS — R410 Disorientation, unspecified: Secondary | ICD-10-CM | POA: Diagnosis not present

## 2021-04-25 DIAGNOSIS — R300 Dysuria: Secondary | ICD-10-CM | POA: Diagnosis not present

## 2021-04-28 DIAGNOSIS — R300 Dysuria: Secondary | ICD-10-CM | POA: Diagnosis not present

## 2021-04-28 DIAGNOSIS — R410 Disorientation, unspecified: Secondary | ICD-10-CM | POA: Diagnosis not present

## 2021-04-28 DIAGNOSIS — N390 Urinary tract infection, site not specified: Secondary | ICD-10-CM | POA: Diagnosis not present

## 2021-05-02 DIAGNOSIS — M6281 Muscle weakness (generalized): Secondary | ICD-10-CM | POA: Diagnosis not present

## 2021-05-02 DIAGNOSIS — G9341 Metabolic encephalopathy: Secondary | ICD-10-CM | POA: Diagnosis not present

## 2021-05-03 DIAGNOSIS — M6281 Muscle weakness (generalized): Secondary | ICD-10-CM | POA: Diagnosis not present

## 2021-05-03 DIAGNOSIS — G9341 Metabolic encephalopathy: Secondary | ICD-10-CM | POA: Diagnosis not present

## 2021-05-04 DIAGNOSIS — M6281 Muscle weakness (generalized): Secondary | ICD-10-CM | POA: Diagnosis not present

## 2021-05-04 DIAGNOSIS — G9341 Metabolic encephalopathy: Secondary | ICD-10-CM | POA: Diagnosis not present

## 2021-05-05 DIAGNOSIS — M6281 Muscle weakness (generalized): Secondary | ICD-10-CM | POA: Diagnosis not present

## 2021-05-05 DIAGNOSIS — G9341 Metabolic encephalopathy: Secondary | ICD-10-CM | POA: Diagnosis not present

## 2021-05-06 DIAGNOSIS — G9341 Metabolic encephalopathy: Secondary | ICD-10-CM | POA: Diagnosis not present

## 2021-05-06 DIAGNOSIS — M6281 Muscle weakness (generalized): Secondary | ICD-10-CM | POA: Diagnosis not present

## 2021-05-07 DIAGNOSIS — M6281 Muscle weakness (generalized): Secondary | ICD-10-CM | POA: Diagnosis not present

## 2021-05-07 DIAGNOSIS — G9341 Metabolic encephalopathy: Secondary | ICD-10-CM | POA: Diagnosis not present

## 2021-05-08 DIAGNOSIS — G9341 Metabolic encephalopathy: Secondary | ICD-10-CM | POA: Diagnosis not present

## 2021-05-08 DIAGNOSIS — M6281 Muscle weakness (generalized): Secondary | ICD-10-CM | POA: Diagnosis not present

## 2021-05-09 DIAGNOSIS — M6281 Muscle weakness (generalized): Secondary | ICD-10-CM | POA: Diagnosis not present

## 2021-05-09 DIAGNOSIS — G9341 Metabolic encephalopathy: Secondary | ICD-10-CM | POA: Diagnosis not present

## 2021-05-10 DIAGNOSIS — G9341 Metabolic encephalopathy: Secondary | ICD-10-CM | POA: Diagnosis not present

## 2021-05-10 DIAGNOSIS — M6281 Muscle weakness (generalized): Secondary | ICD-10-CM | POA: Diagnosis not present

## 2021-05-11 DIAGNOSIS — M6281 Muscle weakness (generalized): Secondary | ICD-10-CM | POA: Diagnosis not present

## 2021-05-11 DIAGNOSIS — G9341 Metabolic encephalopathy: Secondary | ICD-10-CM | POA: Diagnosis not present

## 2021-05-12 DIAGNOSIS — G9341 Metabolic encephalopathy: Secondary | ICD-10-CM | POA: Diagnosis not present

## 2021-05-12 DIAGNOSIS — M6281 Muscle weakness (generalized): Secondary | ICD-10-CM | POA: Diagnosis not present

## 2021-05-13 DIAGNOSIS — M6281 Muscle weakness (generalized): Secondary | ICD-10-CM | POA: Diagnosis not present

## 2021-05-13 DIAGNOSIS — G9341 Metabolic encephalopathy: Secondary | ICD-10-CM | POA: Diagnosis not present

## 2021-05-14 DIAGNOSIS — G9341 Metabolic encephalopathy: Secondary | ICD-10-CM | POA: Diagnosis not present

## 2021-05-14 DIAGNOSIS — M6281 Muscle weakness (generalized): Secondary | ICD-10-CM | POA: Diagnosis not present

## 2021-05-15 DIAGNOSIS — M6281 Muscle weakness (generalized): Secondary | ICD-10-CM | POA: Diagnosis not present

## 2021-05-15 DIAGNOSIS — G9341 Metabolic encephalopathy: Secondary | ICD-10-CM | POA: Diagnosis not present

## 2021-05-16 DIAGNOSIS — M6281 Muscle weakness (generalized): Secondary | ICD-10-CM | POA: Diagnosis not present

## 2021-05-16 DIAGNOSIS — G9341 Metabolic encephalopathy: Secondary | ICD-10-CM | POA: Diagnosis not present

## 2021-05-17 DIAGNOSIS — G9341 Metabolic encephalopathy: Secondary | ICD-10-CM | POA: Diagnosis not present

## 2021-05-17 DIAGNOSIS — M6281 Muscle weakness (generalized): Secondary | ICD-10-CM | POA: Diagnosis not present

## 2021-05-18 DIAGNOSIS — G9341 Metabolic encephalopathy: Secondary | ICD-10-CM | POA: Diagnosis not present

## 2021-05-18 DIAGNOSIS — M6281 Muscle weakness (generalized): Secondary | ICD-10-CM | POA: Diagnosis not present

## 2021-05-19 DIAGNOSIS — G9341 Metabolic encephalopathy: Secondary | ICD-10-CM | POA: Diagnosis not present

## 2021-05-19 DIAGNOSIS — M6281 Muscle weakness (generalized): Secondary | ICD-10-CM | POA: Diagnosis not present

## 2021-05-20 DIAGNOSIS — M6281 Muscle weakness (generalized): Secondary | ICD-10-CM | POA: Diagnosis not present

## 2021-05-20 DIAGNOSIS — G9341 Metabolic encephalopathy: Secondary | ICD-10-CM | POA: Diagnosis not present

## 2021-05-21 DIAGNOSIS — G9341 Metabolic encephalopathy: Secondary | ICD-10-CM | POA: Diagnosis not present

## 2021-05-21 DIAGNOSIS — M6281 Muscle weakness (generalized): Secondary | ICD-10-CM | POA: Diagnosis not present

## 2021-05-23 DIAGNOSIS — G9341 Metabolic encephalopathy: Secondary | ICD-10-CM | POA: Diagnosis not present

## 2021-05-23 DIAGNOSIS — M6281 Muscle weakness (generalized): Secondary | ICD-10-CM | POA: Diagnosis not present

## 2021-05-24 DIAGNOSIS — M6281 Muscle weakness (generalized): Secondary | ICD-10-CM | POA: Diagnosis not present

## 2021-05-24 DIAGNOSIS — G9341 Metabolic encephalopathy: Secondary | ICD-10-CM | POA: Diagnosis not present

## 2021-05-25 DIAGNOSIS — G9341 Metabolic encephalopathy: Secondary | ICD-10-CM | POA: Diagnosis not present

## 2021-05-25 DIAGNOSIS — M6281 Muscle weakness (generalized): Secondary | ICD-10-CM | POA: Diagnosis not present

## 2021-05-25 DIAGNOSIS — R82998 Other abnormal findings in urine: Secondary | ICD-10-CM | POA: Diagnosis not present

## 2021-05-26 DIAGNOSIS — R410 Disorientation, unspecified: Secondary | ICD-10-CM | POA: Diagnosis not present

## 2021-05-26 DIAGNOSIS — G9341 Metabolic encephalopathy: Secondary | ICD-10-CM | POA: Diagnosis not present

## 2021-05-26 DIAGNOSIS — M6281 Muscle weakness (generalized): Secondary | ICD-10-CM | POA: Diagnosis not present

## 2021-05-26 DIAGNOSIS — N390 Urinary tract infection, site not specified: Secondary | ICD-10-CM | POA: Diagnosis not present

## 2021-05-26 DIAGNOSIS — R300 Dysuria: Secondary | ICD-10-CM | POA: Diagnosis not present

## 2021-05-27 DIAGNOSIS — M6281 Muscle weakness (generalized): Secondary | ICD-10-CM | POA: Diagnosis not present

## 2021-05-27 DIAGNOSIS — G9341 Metabolic encephalopathy: Secondary | ICD-10-CM | POA: Diagnosis not present

## 2021-05-28 DIAGNOSIS — M6281 Muscle weakness (generalized): Secondary | ICD-10-CM | POA: Diagnosis not present

## 2021-05-28 DIAGNOSIS — G9341 Metabolic encephalopathy: Secondary | ICD-10-CM | POA: Diagnosis not present

## 2021-05-29 DIAGNOSIS — G9341 Metabolic encephalopathy: Secondary | ICD-10-CM | POA: Diagnosis not present

## 2021-05-29 DIAGNOSIS — M6281 Muscle weakness (generalized): Secondary | ICD-10-CM | POA: Diagnosis not present

## 2021-05-30 DIAGNOSIS — R451 Restlessness and agitation: Secondary | ICD-10-CM | POA: Diagnosis not present

## 2021-05-30 DIAGNOSIS — N390 Urinary tract infection, site not specified: Secondary | ICD-10-CM | POA: Diagnosis not present

## 2021-05-30 DIAGNOSIS — R410 Disorientation, unspecified: Secondary | ICD-10-CM | POA: Diagnosis not present

## 2021-05-30 DIAGNOSIS — G9341 Metabolic encephalopathy: Secondary | ICD-10-CM | POA: Diagnosis not present

## 2021-05-30 DIAGNOSIS — R300 Dysuria: Secondary | ICD-10-CM | POA: Diagnosis not present

## 2021-05-30 DIAGNOSIS — M6281 Muscle weakness (generalized): Secondary | ICD-10-CM | POA: Diagnosis not present

## 2021-05-30 DIAGNOSIS — R5381 Other malaise: Secondary | ICD-10-CM | POA: Diagnosis not present

## 2021-05-31 DIAGNOSIS — G9341 Metabolic encephalopathy: Secondary | ICD-10-CM | POA: Diagnosis not present

## 2021-05-31 DIAGNOSIS — M6281 Muscle weakness (generalized): Secondary | ICD-10-CM | POA: Diagnosis not present

## 2021-06-01 DIAGNOSIS — I10 Essential (primary) hypertension: Secondary | ICD-10-CM | POA: Diagnosis not present

## 2021-06-01 DIAGNOSIS — I5032 Chronic diastolic (congestive) heart failure: Secondary | ICD-10-CM | POA: Diagnosis not present

## 2021-06-01 DIAGNOSIS — N1831 Chronic kidney disease, stage 3a: Secondary | ICD-10-CM | POA: Diagnosis not present

## 2021-06-01 DIAGNOSIS — E1110 Type 2 diabetes mellitus with ketoacidosis without coma: Secondary | ICD-10-CM | POA: Diagnosis not present

## 2021-06-01 DIAGNOSIS — G9341 Metabolic encephalopathy: Secondary | ICD-10-CM | POA: Diagnosis not present

## 2021-06-01 DIAGNOSIS — M6281 Muscle weakness (generalized): Secondary | ICD-10-CM | POA: Diagnosis not present

## 2021-06-02 DIAGNOSIS — E1169 Type 2 diabetes mellitus with other specified complication: Secondary | ICD-10-CM | POA: Diagnosis not present

## 2021-06-02 DIAGNOSIS — I10 Essential (primary) hypertension: Secondary | ICD-10-CM | POA: Diagnosis not present

## 2021-06-02 DIAGNOSIS — I5032 Chronic diastolic (congestive) heart failure: Secondary | ICD-10-CM | POA: Diagnosis not present

## 2021-06-02 DIAGNOSIS — I739 Peripheral vascular disease, unspecified: Secondary | ICD-10-CM | POA: Diagnosis not present

## 2021-06-02 DIAGNOSIS — Z89512 Acquired absence of left leg below knee: Secondary | ICD-10-CM | POA: Diagnosis not present

## 2021-06-02 DIAGNOSIS — J4520 Mild intermittent asthma, uncomplicated: Secondary | ICD-10-CM | POA: Diagnosis not present

## 2021-06-02 DIAGNOSIS — R2681 Unsteadiness on feet: Secondary | ICD-10-CM | POA: Diagnosis not present

## 2021-06-02 DIAGNOSIS — N390 Urinary tract infection, site not specified: Secondary | ICD-10-CM | POA: Diagnosis not present

## 2021-06-02 DIAGNOSIS — N1831 Chronic kidney disease, stage 3a: Secondary | ICD-10-CM | POA: Diagnosis not present

## 2021-06-02 DIAGNOSIS — M6281 Muscle weakness (generalized): Secondary | ICD-10-CM | POA: Diagnosis not present

## 2021-06-02 DIAGNOSIS — I480 Paroxysmal atrial fibrillation: Secondary | ICD-10-CM | POA: Diagnosis not present

## 2021-06-06 DIAGNOSIS — R739 Hyperglycemia, unspecified: Secondary | ICD-10-CM | POA: Diagnosis not present

## 2021-06-07 DIAGNOSIS — I480 Paroxysmal atrial fibrillation: Secondary | ICD-10-CM | POA: Diagnosis not present

## 2021-06-07 DIAGNOSIS — J45909 Unspecified asthma, uncomplicated: Secondary | ICD-10-CM | POA: Diagnosis not present

## 2021-06-07 DIAGNOSIS — I10 Essential (primary) hypertension: Secondary | ICD-10-CM | POA: Diagnosis not present

## 2021-06-07 DIAGNOSIS — I5032 Chronic diastolic (congestive) heart failure: Secondary | ICD-10-CM | POA: Diagnosis not present

## 2021-06-07 DIAGNOSIS — Z794 Long term (current) use of insulin: Secondary | ICD-10-CM | POA: Diagnosis not present

## 2021-06-07 DIAGNOSIS — E1165 Type 2 diabetes mellitus with hyperglycemia: Secondary | ICD-10-CM | POA: Diagnosis not present

## 2021-06-07 DIAGNOSIS — N183 Chronic kidney disease, stage 3 unspecified: Secondary | ICD-10-CM | POA: Diagnosis not present

## 2021-06-09 DIAGNOSIS — R296 Repeated falls: Secondary | ICD-10-CM | POA: Diagnosis not present

## 2021-06-10 DIAGNOSIS — L89892 Pressure ulcer of other site, stage 2: Secondary | ICD-10-CM | POA: Diagnosis not present

## 2021-06-10 DIAGNOSIS — Z9181 History of falling: Secondary | ICD-10-CM | POA: Diagnosis not present

## 2021-06-10 DIAGNOSIS — Z743 Need for continuous supervision: Secondary | ICD-10-CM | POA: Diagnosis not present

## 2021-06-10 DIAGNOSIS — R6889 Other general symptoms and signs: Secondary | ICD-10-CM | POA: Diagnosis not present

## 2021-06-10 DIAGNOSIS — Y792 Prosthetic and other implants, materials and accessory orthopedic devices associated with adverse incidents: Secondary | ICD-10-CM | POA: Diagnosis not present

## 2021-06-10 DIAGNOSIS — Z89512 Acquired absence of left leg below knee: Secondary | ICD-10-CM | POA: Diagnosis not present

## 2021-06-10 DIAGNOSIS — Z794 Long term (current) use of insulin: Secondary | ICD-10-CM | POA: Diagnosis not present

## 2021-06-10 DIAGNOSIS — R930 Abnormal findings on diagnostic imaging of skull and head, not elsewhere classified: Secondary | ICD-10-CM | POA: Diagnosis not present

## 2021-06-10 DIAGNOSIS — Z043 Encounter for examination and observation following other accident: Secondary | ICD-10-CM | POA: Diagnosis not present

## 2021-06-10 DIAGNOSIS — W19XXXA Unspecified fall, initial encounter: Secondary | ICD-10-CM | POA: Diagnosis not present

## 2021-06-10 DIAGNOSIS — E119 Type 2 diabetes mellitus without complications: Secondary | ICD-10-CM | POA: Diagnosis not present

## 2021-06-10 DIAGNOSIS — M6281 Muscle weakness (generalized): Secondary | ICD-10-CM | POA: Diagnosis not present

## 2021-06-10 DIAGNOSIS — Z993 Dependence on wheelchair: Secondary | ICD-10-CM | POA: Diagnosis not present

## 2021-06-10 DIAGNOSIS — I6381 Other cerebral infarction due to occlusion or stenosis of small artery: Secondary | ICD-10-CM | POA: Diagnosis not present

## 2021-06-14 DIAGNOSIS — J45909 Unspecified asthma, uncomplicated: Secondary | ICD-10-CM | POA: Diagnosis not present

## 2021-06-14 DIAGNOSIS — E038 Other specified hypothyroidism: Secondary | ICD-10-CM | POA: Diagnosis not present

## 2021-06-14 DIAGNOSIS — Z79899 Other long term (current) drug therapy: Secondary | ICD-10-CM | POA: Diagnosis not present

## 2021-06-14 DIAGNOSIS — Z9181 History of falling: Secondary | ICD-10-CM | POA: Diagnosis not present

## 2021-06-14 DIAGNOSIS — I480 Paroxysmal atrial fibrillation: Secondary | ICD-10-CM | POA: Diagnosis not present

## 2021-06-14 DIAGNOSIS — Z993 Dependence on wheelchair: Secondary | ICD-10-CM | POA: Diagnosis not present

## 2021-06-14 DIAGNOSIS — Z794 Long term (current) use of insulin: Secondary | ICD-10-CM | POA: Diagnosis not present

## 2021-06-14 DIAGNOSIS — Y792 Prosthetic and other implants, materials and accessory orthopedic devices associated with adverse incidents: Secondary | ICD-10-CM | POA: Diagnosis not present

## 2021-06-14 DIAGNOSIS — M6281 Muscle weakness (generalized): Secondary | ICD-10-CM | POA: Diagnosis not present

## 2021-06-14 DIAGNOSIS — D518 Other vitamin B12 deficiency anemias: Secondary | ICD-10-CM | POA: Diagnosis not present

## 2021-06-14 DIAGNOSIS — E119 Type 2 diabetes mellitus without complications: Secondary | ICD-10-CM | POA: Diagnosis not present

## 2021-06-14 DIAGNOSIS — E7849 Other hyperlipidemia: Secondary | ICD-10-CM | POA: Diagnosis not present

## 2021-06-14 DIAGNOSIS — R296 Repeated falls: Secondary | ICD-10-CM | POA: Diagnosis not present

## 2021-06-14 DIAGNOSIS — I5032 Chronic diastolic (congestive) heart failure: Secondary | ICD-10-CM | POA: Diagnosis not present

## 2021-06-14 DIAGNOSIS — Z89512 Acquired absence of left leg below knee: Secondary | ICD-10-CM | POA: Diagnosis not present

## 2021-06-14 DIAGNOSIS — L89892 Pressure ulcer of other site, stage 2: Secondary | ICD-10-CM | POA: Diagnosis not present

## 2021-06-15 DIAGNOSIS — Z9181 History of falling: Secondary | ICD-10-CM | POA: Diagnosis not present

## 2021-06-15 DIAGNOSIS — E119 Type 2 diabetes mellitus without complications: Secondary | ICD-10-CM | POA: Diagnosis not present

## 2021-06-15 DIAGNOSIS — Z794 Long term (current) use of insulin: Secondary | ICD-10-CM | POA: Diagnosis not present

## 2021-06-15 DIAGNOSIS — Z89512 Acquired absence of left leg below knee: Secondary | ICD-10-CM | POA: Diagnosis not present

## 2021-06-15 DIAGNOSIS — L89892 Pressure ulcer of other site, stage 2: Secondary | ICD-10-CM | POA: Diagnosis not present

## 2021-06-15 DIAGNOSIS — Y792 Prosthetic and other implants, materials and accessory orthopedic devices associated with adverse incidents: Secondary | ICD-10-CM | POA: Diagnosis not present

## 2021-06-15 DIAGNOSIS — Z993 Dependence on wheelchair: Secondary | ICD-10-CM | POA: Diagnosis not present

## 2021-06-15 DIAGNOSIS — M6281 Muscle weakness (generalized): Secondary | ICD-10-CM | POA: Diagnosis not present

## 2021-06-16 DIAGNOSIS — M6281 Muscle weakness (generalized): Secondary | ICD-10-CM | POA: Diagnosis not present

## 2021-06-16 DIAGNOSIS — Z993 Dependence on wheelchair: Secondary | ICD-10-CM | POA: Diagnosis not present

## 2021-06-16 DIAGNOSIS — Z89512 Acquired absence of left leg below knee: Secondary | ICD-10-CM | POA: Diagnosis not present

## 2021-06-16 DIAGNOSIS — L89892 Pressure ulcer of other site, stage 2: Secondary | ICD-10-CM | POA: Diagnosis not present

## 2021-06-16 DIAGNOSIS — Z9181 History of falling: Secondary | ICD-10-CM | POA: Diagnosis not present

## 2021-06-16 DIAGNOSIS — Z794 Long term (current) use of insulin: Secondary | ICD-10-CM | POA: Diagnosis not present

## 2021-06-16 DIAGNOSIS — E119 Type 2 diabetes mellitus without complications: Secondary | ICD-10-CM | POA: Diagnosis not present

## 2021-06-16 DIAGNOSIS — Y792 Prosthetic and other implants, materials and accessory orthopedic devices associated with adverse incidents: Secondary | ICD-10-CM | POA: Diagnosis not present

## 2021-06-17 DIAGNOSIS — Z9181 History of falling: Secondary | ICD-10-CM | POA: Diagnosis not present

## 2021-06-17 DIAGNOSIS — Z993 Dependence on wheelchair: Secondary | ICD-10-CM | POA: Diagnosis not present

## 2021-06-17 DIAGNOSIS — Y792 Prosthetic and other implants, materials and accessory orthopedic devices associated with adverse incidents: Secondary | ICD-10-CM | POA: Diagnosis not present

## 2021-06-17 DIAGNOSIS — E119 Type 2 diabetes mellitus without complications: Secondary | ICD-10-CM | POA: Diagnosis not present

## 2021-06-17 DIAGNOSIS — Z89512 Acquired absence of left leg below knee: Secondary | ICD-10-CM | POA: Diagnosis not present

## 2021-06-17 DIAGNOSIS — L89892 Pressure ulcer of other site, stage 2: Secondary | ICD-10-CM | POA: Diagnosis not present

## 2021-06-17 DIAGNOSIS — M6281 Muscle weakness (generalized): Secondary | ICD-10-CM | POA: Diagnosis not present

## 2021-06-17 DIAGNOSIS — Z794 Long term (current) use of insulin: Secondary | ICD-10-CM | POA: Diagnosis not present

## 2021-06-20 DIAGNOSIS — L89892 Pressure ulcer of other site, stage 2: Secondary | ICD-10-CM | POA: Diagnosis not present

## 2021-06-20 DIAGNOSIS — E038 Other specified hypothyroidism: Secondary | ICD-10-CM | POA: Diagnosis not present

## 2021-06-20 DIAGNOSIS — D518 Other vitamin B12 deficiency anemias: Secondary | ICD-10-CM | POA: Diagnosis not present

## 2021-06-20 DIAGNOSIS — Z89512 Acquired absence of left leg below knee: Secondary | ICD-10-CM | POA: Diagnosis not present

## 2021-06-20 DIAGNOSIS — M6281 Muscle weakness (generalized): Secondary | ICD-10-CM | POA: Diagnosis not present

## 2021-06-20 DIAGNOSIS — Z9181 History of falling: Secondary | ICD-10-CM | POA: Diagnosis not present

## 2021-06-20 DIAGNOSIS — E559 Vitamin D deficiency, unspecified: Secondary | ICD-10-CM | POA: Diagnosis not present

## 2021-06-20 DIAGNOSIS — Z794 Long term (current) use of insulin: Secondary | ICD-10-CM | POA: Diagnosis not present

## 2021-06-20 DIAGNOSIS — Z993 Dependence on wheelchair: Secondary | ICD-10-CM | POA: Diagnosis not present

## 2021-06-20 DIAGNOSIS — N183 Chronic kidney disease, stage 3 unspecified: Secondary | ICD-10-CM | POA: Diagnosis not present

## 2021-06-20 DIAGNOSIS — E119 Type 2 diabetes mellitus without complications: Secondary | ICD-10-CM | POA: Diagnosis not present

## 2021-06-20 DIAGNOSIS — Y792 Prosthetic and other implants, materials and accessory orthopedic devices associated with adverse incidents: Secondary | ICD-10-CM | POA: Diagnosis not present

## 2021-06-20 DIAGNOSIS — I10 Essential (primary) hypertension: Secondary | ICD-10-CM | POA: Diagnosis not present

## 2021-06-21 DIAGNOSIS — Z993 Dependence on wheelchair: Secondary | ICD-10-CM | POA: Diagnosis not present

## 2021-06-21 DIAGNOSIS — L89892 Pressure ulcer of other site, stage 2: Secondary | ICD-10-CM | POA: Diagnosis not present

## 2021-06-21 DIAGNOSIS — Z89512 Acquired absence of left leg below knee: Secondary | ICD-10-CM | POA: Diagnosis not present

## 2021-06-21 DIAGNOSIS — M6281 Muscle weakness (generalized): Secondary | ICD-10-CM | POA: Diagnosis not present

## 2021-06-21 DIAGNOSIS — Z9181 History of falling: Secondary | ICD-10-CM | POA: Diagnosis not present

## 2021-06-21 DIAGNOSIS — Z794 Long term (current) use of insulin: Secondary | ICD-10-CM | POA: Diagnosis not present

## 2021-06-21 DIAGNOSIS — E119 Type 2 diabetes mellitus without complications: Secondary | ICD-10-CM | POA: Diagnosis not present

## 2021-06-21 DIAGNOSIS — Y792 Prosthetic and other implants, materials and accessory orthopedic devices associated with adverse incidents: Secondary | ICD-10-CM | POA: Diagnosis not present

## 2021-06-23 DIAGNOSIS — L89892 Pressure ulcer of other site, stage 2: Secondary | ICD-10-CM | POA: Diagnosis not present

## 2021-06-23 DIAGNOSIS — E119 Type 2 diabetes mellitus without complications: Secondary | ICD-10-CM | POA: Diagnosis not present

## 2021-06-23 DIAGNOSIS — M6281 Muscle weakness (generalized): Secondary | ICD-10-CM | POA: Diagnosis not present

## 2021-06-23 DIAGNOSIS — Y792 Prosthetic and other implants, materials and accessory orthopedic devices associated with adverse incidents: Secondary | ICD-10-CM | POA: Diagnosis not present

## 2021-06-23 DIAGNOSIS — Z89512 Acquired absence of left leg below knee: Secondary | ICD-10-CM | POA: Diagnosis not present

## 2021-06-23 DIAGNOSIS — Z794 Long term (current) use of insulin: Secondary | ICD-10-CM | POA: Diagnosis not present

## 2021-06-23 DIAGNOSIS — Z9181 History of falling: Secondary | ICD-10-CM | POA: Diagnosis not present

## 2021-06-23 DIAGNOSIS — Z993 Dependence on wheelchair: Secondary | ICD-10-CM | POA: Diagnosis not present

## 2021-06-27 DIAGNOSIS — E1165 Type 2 diabetes mellitus with hyperglycemia: Secondary | ICD-10-CM | POA: Diagnosis not present

## 2021-06-28 DIAGNOSIS — M6281 Muscle weakness (generalized): Secondary | ICD-10-CM | POA: Diagnosis not present

## 2021-06-28 DIAGNOSIS — Z9181 History of falling: Secondary | ICD-10-CM | POA: Diagnosis not present

## 2021-06-28 DIAGNOSIS — Z993 Dependence on wheelchair: Secondary | ICD-10-CM | POA: Diagnosis not present

## 2021-06-28 DIAGNOSIS — L89892 Pressure ulcer of other site, stage 2: Secondary | ICD-10-CM | POA: Diagnosis not present

## 2021-06-28 DIAGNOSIS — Z794 Long term (current) use of insulin: Secondary | ICD-10-CM | POA: Diagnosis not present

## 2021-06-28 DIAGNOSIS — Z89512 Acquired absence of left leg below knee: Secondary | ICD-10-CM | POA: Diagnosis not present

## 2021-06-28 DIAGNOSIS — Y792 Prosthetic and other implants, materials and accessory orthopedic devices associated with adverse incidents: Secondary | ICD-10-CM | POA: Diagnosis not present

## 2021-06-28 DIAGNOSIS — E119 Type 2 diabetes mellitus without complications: Secondary | ICD-10-CM | POA: Diagnosis not present

## 2021-07-03 DIAGNOSIS — E119 Type 2 diabetes mellitus without complications: Secondary | ICD-10-CM | POA: Diagnosis not present

## 2021-07-03 DIAGNOSIS — I10 Essential (primary) hypertension: Secondary | ICD-10-CM | POA: Diagnosis not present

## 2021-07-03 DIAGNOSIS — E038 Other specified hypothyroidism: Secondary | ICD-10-CM | POA: Diagnosis not present

## 2021-07-03 DIAGNOSIS — N183 Chronic kidney disease, stage 3 unspecified: Secondary | ICD-10-CM | POA: Diagnosis not present

## 2021-07-03 DIAGNOSIS — D518 Other vitamin B12 deficiency anemias: Secondary | ICD-10-CM | POA: Diagnosis not present

## 2021-07-03 DIAGNOSIS — E559 Vitamin D deficiency, unspecified: Secondary | ICD-10-CM | POA: Diagnosis not present

## 2021-07-04 DIAGNOSIS — Z794 Long term (current) use of insulin: Secondary | ICD-10-CM | POA: Diagnosis not present

## 2021-07-04 DIAGNOSIS — Y792 Prosthetic and other implants, materials and accessory orthopedic devices associated with adverse incidents: Secondary | ICD-10-CM | POA: Diagnosis not present

## 2021-07-04 DIAGNOSIS — M6281 Muscle weakness (generalized): Secondary | ICD-10-CM | POA: Diagnosis not present

## 2021-07-04 DIAGNOSIS — Z9181 History of falling: Secondary | ICD-10-CM | POA: Diagnosis not present

## 2021-07-04 DIAGNOSIS — Z89512 Acquired absence of left leg below knee: Secondary | ICD-10-CM | POA: Diagnosis not present

## 2021-07-04 DIAGNOSIS — E119 Type 2 diabetes mellitus without complications: Secondary | ICD-10-CM | POA: Diagnosis not present

## 2021-07-04 DIAGNOSIS — L89892 Pressure ulcer of other site, stage 2: Secondary | ICD-10-CM | POA: Diagnosis not present

## 2021-07-04 DIAGNOSIS — Z993 Dependence on wheelchair: Secondary | ICD-10-CM | POA: Diagnosis not present

## 2021-07-05 DIAGNOSIS — I480 Paroxysmal atrial fibrillation: Secondary | ICD-10-CM | POA: Diagnosis not present

## 2021-07-05 DIAGNOSIS — I5032 Chronic diastolic (congestive) heart failure: Secondary | ICD-10-CM | POA: Diagnosis not present

## 2021-07-05 DIAGNOSIS — I10 Essential (primary) hypertension: Secondary | ICD-10-CM | POA: Diagnosis not present

## 2021-07-05 DIAGNOSIS — E1165 Type 2 diabetes mellitus with hyperglycemia: Secondary | ICD-10-CM | POA: Diagnosis not present

## 2021-07-05 DIAGNOSIS — J45909 Unspecified asthma, uncomplicated: Secondary | ICD-10-CM | POA: Diagnosis not present

## 2021-07-05 DIAGNOSIS — N183 Chronic kidney disease, stage 3 unspecified: Secondary | ICD-10-CM | POA: Diagnosis not present

## 2021-07-07 DIAGNOSIS — Z794 Long term (current) use of insulin: Secondary | ICD-10-CM | POA: Diagnosis not present

## 2021-07-07 DIAGNOSIS — Y792 Prosthetic and other implants, materials and accessory orthopedic devices associated with adverse incidents: Secondary | ICD-10-CM | POA: Diagnosis not present

## 2021-07-07 DIAGNOSIS — Z9181 History of falling: Secondary | ICD-10-CM | POA: Diagnosis not present

## 2021-07-07 DIAGNOSIS — L89892 Pressure ulcer of other site, stage 2: Secondary | ICD-10-CM | POA: Diagnosis not present

## 2021-07-07 DIAGNOSIS — Z79899 Other long term (current) drug therapy: Secondary | ICD-10-CM | POA: Diagnosis not present

## 2021-07-07 DIAGNOSIS — E119 Type 2 diabetes mellitus without complications: Secondary | ICD-10-CM | POA: Diagnosis not present

## 2021-07-07 DIAGNOSIS — E7849 Other hyperlipidemia: Secondary | ICD-10-CM | POA: Diagnosis not present

## 2021-07-07 DIAGNOSIS — Z993 Dependence on wheelchair: Secondary | ICD-10-CM | POA: Diagnosis not present

## 2021-07-07 DIAGNOSIS — Z89512 Acquired absence of left leg below knee: Secondary | ICD-10-CM | POA: Diagnosis not present

## 2021-07-07 DIAGNOSIS — D518 Other vitamin B12 deficiency anemias: Secondary | ICD-10-CM | POA: Diagnosis not present

## 2021-07-07 DIAGNOSIS — M6281 Muscle weakness (generalized): Secondary | ICD-10-CM | POA: Diagnosis not present

## 2021-07-11 DIAGNOSIS — Y792 Prosthetic and other implants, materials and accessory orthopedic devices associated with adverse incidents: Secondary | ICD-10-CM | POA: Diagnosis not present

## 2021-07-11 DIAGNOSIS — L89892 Pressure ulcer of other site, stage 2: Secondary | ICD-10-CM | POA: Diagnosis not present

## 2021-07-11 DIAGNOSIS — Z794 Long term (current) use of insulin: Secondary | ICD-10-CM | POA: Diagnosis not present

## 2021-07-11 DIAGNOSIS — M6281 Muscle weakness (generalized): Secondary | ICD-10-CM | POA: Diagnosis not present

## 2021-07-11 DIAGNOSIS — Z993 Dependence on wheelchair: Secondary | ICD-10-CM | POA: Diagnosis not present

## 2021-07-11 DIAGNOSIS — E119 Type 2 diabetes mellitus without complications: Secondary | ICD-10-CM | POA: Diagnosis not present

## 2021-07-11 DIAGNOSIS — Z9181 History of falling: Secondary | ICD-10-CM | POA: Diagnosis not present

## 2021-07-11 DIAGNOSIS — Z89512 Acquired absence of left leg below knee: Secondary | ICD-10-CM | POA: Diagnosis not present

## 2021-07-12 DIAGNOSIS — R296 Repeated falls: Secondary | ICD-10-CM | POA: Diagnosis not present

## 2021-07-12 DIAGNOSIS — L03116 Cellulitis of left lower limb: Secondary | ICD-10-CM | POA: Diagnosis not present

## 2021-07-12 DIAGNOSIS — R5381 Other malaise: Secondary | ICD-10-CM | POA: Diagnosis not present

## 2021-07-12 DIAGNOSIS — L89899 Pressure ulcer of other site, unspecified stage: Secondary | ICD-10-CM | POA: Diagnosis not present

## 2021-07-15 DIAGNOSIS — L89892 Pressure ulcer of other site, stage 2: Secondary | ICD-10-CM | POA: Diagnosis not present

## 2021-07-15 DIAGNOSIS — Z993 Dependence on wheelchair: Secondary | ICD-10-CM | POA: Diagnosis not present

## 2021-07-15 DIAGNOSIS — M6281 Muscle weakness (generalized): Secondary | ICD-10-CM | POA: Diagnosis not present

## 2021-07-15 DIAGNOSIS — Z9181 History of falling: Secondary | ICD-10-CM | POA: Diagnosis not present

## 2021-07-15 DIAGNOSIS — E119 Type 2 diabetes mellitus without complications: Secondary | ICD-10-CM | POA: Diagnosis not present

## 2021-07-15 DIAGNOSIS — Z89512 Acquired absence of left leg below knee: Secondary | ICD-10-CM | POA: Diagnosis not present

## 2021-07-15 DIAGNOSIS — Y792 Prosthetic and other implants, materials and accessory orthopedic devices associated with adverse incidents: Secondary | ICD-10-CM | POA: Diagnosis not present

## 2021-07-15 DIAGNOSIS — Z794 Long term (current) use of insulin: Secondary | ICD-10-CM | POA: Diagnosis not present

## 2021-07-18 DIAGNOSIS — Z794 Long term (current) use of insulin: Secondary | ICD-10-CM | POA: Diagnosis not present

## 2021-07-18 DIAGNOSIS — Z993 Dependence on wheelchair: Secondary | ICD-10-CM | POA: Diagnosis not present

## 2021-07-18 DIAGNOSIS — Z89512 Acquired absence of left leg below knee: Secondary | ICD-10-CM | POA: Diagnosis not present

## 2021-07-18 DIAGNOSIS — E119 Type 2 diabetes mellitus without complications: Secondary | ICD-10-CM | POA: Diagnosis not present

## 2021-07-18 DIAGNOSIS — Z9181 History of falling: Secondary | ICD-10-CM | POA: Diagnosis not present

## 2021-07-18 DIAGNOSIS — M6281 Muscle weakness (generalized): Secondary | ICD-10-CM | POA: Diagnosis not present

## 2021-07-18 DIAGNOSIS — L89892 Pressure ulcer of other site, stage 2: Secondary | ICD-10-CM | POA: Diagnosis not present

## 2021-07-18 DIAGNOSIS — Y792 Prosthetic and other implants, materials and accessory orthopedic devices associated with adverse incidents: Secondary | ICD-10-CM | POA: Diagnosis not present

## 2021-07-19 DIAGNOSIS — L97121 Non-pressure chronic ulcer of left thigh limited to breakdown of skin: Secondary | ICD-10-CM | POA: Diagnosis not present

## 2021-07-20 DIAGNOSIS — Z794 Long term (current) use of insulin: Secondary | ICD-10-CM | POA: Diagnosis not present

## 2021-07-20 DIAGNOSIS — L89892 Pressure ulcer of other site, stage 2: Secondary | ICD-10-CM | POA: Diagnosis not present

## 2021-07-20 DIAGNOSIS — Z9181 History of falling: Secondary | ICD-10-CM | POA: Diagnosis not present

## 2021-07-20 DIAGNOSIS — E119 Type 2 diabetes mellitus without complications: Secondary | ICD-10-CM | POA: Diagnosis not present

## 2021-07-20 DIAGNOSIS — Z89512 Acquired absence of left leg below knee: Secondary | ICD-10-CM | POA: Diagnosis not present

## 2021-07-20 DIAGNOSIS — M6281 Muscle weakness (generalized): Secondary | ICD-10-CM | POA: Diagnosis not present

## 2021-07-20 DIAGNOSIS — Y792 Prosthetic and other implants, materials and accessory orthopedic devices associated with adverse incidents: Secondary | ICD-10-CM | POA: Diagnosis not present

## 2021-07-20 DIAGNOSIS — Z993 Dependence on wheelchair: Secondary | ICD-10-CM | POA: Diagnosis not present

## 2021-07-22 DIAGNOSIS — Z9181 History of falling: Secondary | ICD-10-CM | POA: Diagnosis not present

## 2021-07-22 DIAGNOSIS — Z794 Long term (current) use of insulin: Secondary | ICD-10-CM | POA: Diagnosis not present

## 2021-07-22 DIAGNOSIS — E119 Type 2 diabetes mellitus without complications: Secondary | ICD-10-CM | POA: Diagnosis not present

## 2021-07-22 DIAGNOSIS — Z993 Dependence on wheelchair: Secondary | ICD-10-CM | POA: Diagnosis not present

## 2021-07-22 DIAGNOSIS — Z89512 Acquired absence of left leg below knee: Secondary | ICD-10-CM | POA: Diagnosis not present

## 2021-07-22 DIAGNOSIS — M6281 Muscle weakness (generalized): Secondary | ICD-10-CM | POA: Diagnosis not present

## 2021-07-22 DIAGNOSIS — Y792 Prosthetic and other implants, materials and accessory orthopedic devices associated with adverse incidents: Secondary | ICD-10-CM | POA: Diagnosis not present

## 2021-07-22 DIAGNOSIS — L89892 Pressure ulcer of other site, stage 2: Secondary | ICD-10-CM | POA: Diagnosis not present

## 2021-07-25 DIAGNOSIS — Z9181 History of falling: Secondary | ICD-10-CM | POA: Diagnosis not present

## 2021-07-25 DIAGNOSIS — M6281 Muscle weakness (generalized): Secondary | ICD-10-CM | POA: Diagnosis not present

## 2021-07-25 DIAGNOSIS — Y792 Prosthetic and other implants, materials and accessory orthopedic devices associated with adverse incidents: Secondary | ICD-10-CM | POA: Diagnosis not present

## 2021-07-25 DIAGNOSIS — Z89512 Acquired absence of left leg below knee: Secondary | ICD-10-CM | POA: Diagnosis not present

## 2021-07-25 DIAGNOSIS — E119 Type 2 diabetes mellitus without complications: Secondary | ICD-10-CM | POA: Diagnosis not present

## 2021-07-25 DIAGNOSIS — L89892 Pressure ulcer of other site, stage 2: Secondary | ICD-10-CM | POA: Diagnosis not present

## 2021-07-25 DIAGNOSIS — Z993 Dependence on wheelchair: Secondary | ICD-10-CM | POA: Diagnosis not present

## 2021-07-25 DIAGNOSIS — Z794 Long term (current) use of insulin: Secondary | ICD-10-CM | POA: Diagnosis not present

## 2021-07-26 DIAGNOSIS — Z89512 Acquired absence of left leg below knee: Secondary | ICD-10-CM | POA: Diagnosis not present

## 2021-07-26 DIAGNOSIS — L97121 Non-pressure chronic ulcer of left thigh limited to breakdown of skin: Secondary | ICD-10-CM | POA: Diagnosis not present

## 2021-07-26 DIAGNOSIS — Z9181 History of falling: Secondary | ICD-10-CM | POA: Diagnosis not present

## 2021-07-26 DIAGNOSIS — L89892 Pressure ulcer of other site, stage 2: Secondary | ICD-10-CM | POA: Diagnosis not present

## 2021-07-26 DIAGNOSIS — E119 Type 2 diabetes mellitus without complications: Secondary | ICD-10-CM | POA: Diagnosis not present

## 2021-07-26 DIAGNOSIS — M6281 Muscle weakness (generalized): Secondary | ICD-10-CM | POA: Diagnosis not present

## 2021-07-26 DIAGNOSIS — Z794 Long term (current) use of insulin: Secondary | ICD-10-CM | POA: Diagnosis not present

## 2021-07-26 DIAGNOSIS — Y792 Prosthetic and other implants, materials and accessory orthopedic devices associated with adverse incidents: Secondary | ICD-10-CM | POA: Diagnosis not present

## 2021-07-26 DIAGNOSIS — Z993 Dependence on wheelchair: Secondary | ICD-10-CM | POA: Diagnosis not present

## 2021-07-27 DIAGNOSIS — Y792 Prosthetic and other implants, materials and accessory orthopedic devices associated with adverse incidents: Secondary | ICD-10-CM | POA: Diagnosis not present

## 2021-07-27 DIAGNOSIS — E1165 Type 2 diabetes mellitus with hyperglycemia: Secondary | ICD-10-CM | POA: Diagnosis not present

## 2021-07-27 DIAGNOSIS — L89892 Pressure ulcer of other site, stage 2: Secondary | ICD-10-CM | POA: Diagnosis not present

## 2021-07-27 DIAGNOSIS — Z993 Dependence on wheelchair: Secondary | ICD-10-CM | POA: Diagnosis not present

## 2021-07-27 DIAGNOSIS — Z9181 History of falling: Secondary | ICD-10-CM | POA: Diagnosis not present

## 2021-07-27 DIAGNOSIS — Z794 Long term (current) use of insulin: Secondary | ICD-10-CM | POA: Diagnosis not present

## 2021-07-27 DIAGNOSIS — E119 Type 2 diabetes mellitus without complications: Secondary | ICD-10-CM | POA: Diagnosis not present

## 2021-07-27 DIAGNOSIS — Z89512 Acquired absence of left leg below knee: Secondary | ICD-10-CM | POA: Diagnosis not present

## 2021-07-27 DIAGNOSIS — M6281 Muscle weakness (generalized): Secondary | ICD-10-CM | POA: Diagnosis not present

## 2021-07-29 DIAGNOSIS — L89892 Pressure ulcer of other site, stage 2: Secondary | ICD-10-CM | POA: Diagnosis not present

## 2021-07-29 DIAGNOSIS — M6281 Muscle weakness (generalized): Secondary | ICD-10-CM | POA: Diagnosis not present

## 2021-07-29 DIAGNOSIS — E119 Type 2 diabetes mellitus without complications: Secondary | ICD-10-CM | POA: Diagnosis not present

## 2021-07-29 DIAGNOSIS — Z9181 History of falling: Secondary | ICD-10-CM | POA: Diagnosis not present

## 2021-07-29 DIAGNOSIS — Z89512 Acquired absence of left leg below knee: Secondary | ICD-10-CM | POA: Diagnosis not present

## 2021-07-29 DIAGNOSIS — Y792 Prosthetic and other implants, materials and accessory orthopedic devices associated with adverse incidents: Secondary | ICD-10-CM | POA: Diagnosis not present

## 2021-07-29 DIAGNOSIS — Z794 Long term (current) use of insulin: Secondary | ICD-10-CM | POA: Diagnosis not present

## 2021-08-01 DIAGNOSIS — Z89512 Acquired absence of left leg below knee: Secondary | ICD-10-CM | POA: Diagnosis not present

## 2021-08-01 DIAGNOSIS — Z794 Long term (current) use of insulin: Secondary | ICD-10-CM | POA: Diagnosis not present

## 2021-08-01 DIAGNOSIS — M6281 Muscle weakness (generalized): Secondary | ICD-10-CM | POA: Diagnosis not present

## 2021-08-01 DIAGNOSIS — Z9181 History of falling: Secondary | ICD-10-CM | POA: Diagnosis not present

## 2021-08-01 DIAGNOSIS — E119 Type 2 diabetes mellitus without complications: Secondary | ICD-10-CM | POA: Diagnosis not present

## 2021-08-01 DIAGNOSIS — Y792 Prosthetic and other implants, materials and accessory orthopedic devices associated with adverse incidents: Secondary | ICD-10-CM | POA: Diagnosis not present

## 2021-08-01 DIAGNOSIS — L89892 Pressure ulcer of other site, stage 2: Secondary | ICD-10-CM | POA: Diagnosis not present

## 2021-08-03 DIAGNOSIS — Z89512 Acquired absence of left leg below knee: Secondary | ICD-10-CM | POA: Diagnosis not present

## 2021-08-03 DIAGNOSIS — L89892 Pressure ulcer of other site, stage 2: Secondary | ICD-10-CM | POA: Diagnosis not present

## 2021-08-03 DIAGNOSIS — Y792 Prosthetic and other implants, materials and accessory orthopedic devices associated with adverse incidents: Secondary | ICD-10-CM | POA: Diagnosis not present

## 2021-08-03 DIAGNOSIS — E119 Type 2 diabetes mellitus without complications: Secondary | ICD-10-CM | POA: Diagnosis not present

## 2021-08-03 DIAGNOSIS — Z794 Long term (current) use of insulin: Secondary | ICD-10-CM | POA: Diagnosis not present

## 2021-08-03 DIAGNOSIS — M6281 Muscle weakness (generalized): Secondary | ICD-10-CM | POA: Diagnosis not present

## 2021-08-03 DIAGNOSIS — Z9181 History of falling: Secondary | ICD-10-CM | POA: Diagnosis not present

## 2021-08-04 DIAGNOSIS — M6281 Muscle weakness (generalized): Secondary | ICD-10-CM | POA: Diagnosis not present

## 2021-08-04 DIAGNOSIS — Z9181 History of falling: Secondary | ICD-10-CM | POA: Diagnosis not present

## 2021-08-04 DIAGNOSIS — L89892 Pressure ulcer of other site, stage 2: Secondary | ICD-10-CM | POA: Diagnosis not present

## 2021-08-04 DIAGNOSIS — Y792 Prosthetic and other implants, materials and accessory orthopedic devices associated with adverse incidents: Secondary | ICD-10-CM | POA: Diagnosis not present

## 2021-08-04 DIAGNOSIS — Z89512 Acquired absence of left leg below knee: Secondary | ICD-10-CM | POA: Diagnosis not present

## 2021-08-04 DIAGNOSIS — E119 Type 2 diabetes mellitus without complications: Secondary | ICD-10-CM | POA: Diagnosis not present

## 2021-08-04 DIAGNOSIS — Z794 Long term (current) use of insulin: Secondary | ICD-10-CM | POA: Diagnosis not present

## 2021-08-05 DIAGNOSIS — M6281 Muscle weakness (generalized): Secondary | ICD-10-CM | POA: Diagnosis not present

## 2021-08-05 DIAGNOSIS — Z89512 Acquired absence of left leg below knee: Secondary | ICD-10-CM | POA: Diagnosis not present

## 2021-08-05 DIAGNOSIS — Y792 Prosthetic and other implants, materials and accessory orthopedic devices associated with adverse incidents: Secondary | ICD-10-CM | POA: Diagnosis not present

## 2021-08-05 DIAGNOSIS — Z9181 History of falling: Secondary | ICD-10-CM | POA: Diagnosis not present

## 2021-08-05 DIAGNOSIS — E119 Type 2 diabetes mellitus without complications: Secondary | ICD-10-CM | POA: Diagnosis not present

## 2021-08-05 DIAGNOSIS — Z794 Long term (current) use of insulin: Secondary | ICD-10-CM | POA: Diagnosis not present

## 2021-08-05 DIAGNOSIS — L89892 Pressure ulcer of other site, stage 2: Secondary | ICD-10-CM | POA: Diagnosis not present

## 2021-08-06 DIAGNOSIS — N183 Chronic kidney disease, stage 3 unspecified: Secondary | ICD-10-CM | POA: Diagnosis not present

## 2021-08-06 DIAGNOSIS — I10 Essential (primary) hypertension: Secondary | ICD-10-CM | POA: Diagnosis not present

## 2021-08-06 DIAGNOSIS — I5032 Chronic diastolic (congestive) heart failure: Secondary | ICD-10-CM | POA: Diagnosis not present

## 2021-08-06 DIAGNOSIS — J45909 Unspecified asthma, uncomplicated: Secondary | ICD-10-CM | POA: Diagnosis not present

## 2021-08-06 DIAGNOSIS — E1165 Type 2 diabetes mellitus with hyperglycemia: Secondary | ICD-10-CM | POA: Diagnosis not present

## 2021-08-06 DIAGNOSIS — E038 Other specified hypothyroidism: Secondary | ICD-10-CM | POA: Diagnosis not present

## 2021-08-06 DIAGNOSIS — E119 Type 2 diabetes mellitus without complications: Secondary | ICD-10-CM | POA: Diagnosis not present

## 2021-08-06 DIAGNOSIS — I480 Paroxysmal atrial fibrillation: Secondary | ICD-10-CM | POA: Diagnosis not present

## 2021-08-06 DIAGNOSIS — D518 Other vitamin B12 deficiency anemias: Secondary | ICD-10-CM | POA: Diagnosis not present

## 2021-08-06 DIAGNOSIS — E559 Vitamin D deficiency, unspecified: Secondary | ICD-10-CM | POA: Diagnosis not present

## 2021-08-08 DIAGNOSIS — Z89512 Acquired absence of left leg below knee: Secondary | ICD-10-CM | POA: Diagnosis not present

## 2021-08-08 DIAGNOSIS — E119 Type 2 diabetes mellitus without complications: Secondary | ICD-10-CM | POA: Diagnosis not present

## 2021-08-08 DIAGNOSIS — Z9181 History of falling: Secondary | ICD-10-CM | POA: Diagnosis not present

## 2021-08-08 DIAGNOSIS — Y792 Prosthetic and other implants, materials and accessory orthopedic devices associated with adverse incidents: Secondary | ICD-10-CM | POA: Diagnosis not present

## 2021-08-08 DIAGNOSIS — Z794 Long term (current) use of insulin: Secondary | ICD-10-CM | POA: Diagnosis not present

## 2021-08-08 DIAGNOSIS — M6281 Muscle weakness (generalized): Secondary | ICD-10-CM | POA: Diagnosis not present

## 2021-08-08 DIAGNOSIS — L89892 Pressure ulcer of other site, stage 2: Secondary | ICD-10-CM | POA: Diagnosis not present

## 2021-08-09 DIAGNOSIS — Z794 Long term (current) use of insulin: Secondary | ICD-10-CM | POA: Diagnosis not present

## 2021-08-09 DIAGNOSIS — Z9181 History of falling: Secondary | ICD-10-CM | POA: Diagnosis not present

## 2021-08-09 DIAGNOSIS — Y792 Prosthetic and other implants, materials and accessory orthopedic devices associated with adverse incidents: Secondary | ICD-10-CM | POA: Diagnosis not present

## 2021-08-09 DIAGNOSIS — M6281 Muscle weakness (generalized): Secondary | ICD-10-CM | POA: Diagnosis not present

## 2021-08-09 DIAGNOSIS — L89892 Pressure ulcer of other site, stage 2: Secondary | ICD-10-CM | POA: Diagnosis not present

## 2021-08-09 DIAGNOSIS — E119 Type 2 diabetes mellitus without complications: Secondary | ICD-10-CM | POA: Diagnosis not present

## 2021-08-09 DIAGNOSIS — Z89512 Acquired absence of left leg below knee: Secondary | ICD-10-CM | POA: Diagnosis not present

## 2021-08-10 DIAGNOSIS — E119 Type 2 diabetes mellitus without complications: Secondary | ICD-10-CM | POA: Diagnosis not present

## 2021-08-10 DIAGNOSIS — Z9181 History of falling: Secondary | ICD-10-CM | POA: Diagnosis not present

## 2021-08-10 DIAGNOSIS — Z89512 Acquired absence of left leg below knee: Secondary | ICD-10-CM | POA: Diagnosis not present

## 2021-08-10 DIAGNOSIS — L89892 Pressure ulcer of other site, stage 2: Secondary | ICD-10-CM | POA: Diagnosis not present

## 2021-08-10 DIAGNOSIS — M6281 Muscle weakness (generalized): Secondary | ICD-10-CM | POA: Diagnosis not present

## 2021-08-10 DIAGNOSIS — Y792 Prosthetic and other implants, materials and accessory orthopedic devices associated with adverse incidents: Secondary | ICD-10-CM | POA: Diagnosis not present

## 2021-08-10 DIAGNOSIS — Z794 Long term (current) use of insulin: Secondary | ICD-10-CM | POA: Diagnosis not present

## 2021-08-11 ENCOUNTER — Telehealth: Payer: Self-pay

## 2021-08-11 NOTE — Telephone Encounter (Signed)
[employer] called on behalf of the pt to get him in for a right foot ulcer. Pt was scheduled for tomorrow with Junie Panning

## 2021-08-12 ENCOUNTER — Ambulatory Visit (INDEPENDENT_AMBULATORY_CARE_PROVIDER_SITE_OTHER): Payer: Medicare Other | Admitting: Family

## 2021-08-12 ENCOUNTER — Encounter: Payer: Self-pay | Admitting: Family

## 2021-08-12 DIAGNOSIS — L97511 Non-pressure chronic ulcer of other part of right foot limited to breakdown of skin: Secondary | ICD-10-CM | POA: Diagnosis not present

## 2021-08-15 DIAGNOSIS — Z9181 History of falling: Secondary | ICD-10-CM | POA: Diagnosis not present

## 2021-08-15 DIAGNOSIS — Y792 Prosthetic and other implants, materials and accessory orthopedic devices associated with adverse incidents: Secondary | ICD-10-CM | POA: Diagnosis not present

## 2021-08-15 DIAGNOSIS — Z89512 Acquired absence of left leg below knee: Secondary | ICD-10-CM | POA: Diagnosis not present

## 2021-08-15 DIAGNOSIS — E119 Type 2 diabetes mellitus without complications: Secondary | ICD-10-CM | POA: Diagnosis not present

## 2021-08-15 DIAGNOSIS — M6281 Muscle weakness (generalized): Secondary | ICD-10-CM | POA: Diagnosis not present

## 2021-08-15 DIAGNOSIS — Z794 Long term (current) use of insulin: Secondary | ICD-10-CM | POA: Diagnosis not present

## 2021-08-15 DIAGNOSIS — L89892 Pressure ulcer of other site, stage 2: Secondary | ICD-10-CM | POA: Diagnosis not present

## 2021-08-17 DIAGNOSIS — L89892 Pressure ulcer of other site, stage 2: Secondary | ICD-10-CM | POA: Diagnosis not present

## 2021-08-17 DIAGNOSIS — Z794 Long term (current) use of insulin: Secondary | ICD-10-CM | POA: Diagnosis not present

## 2021-08-17 DIAGNOSIS — E119 Type 2 diabetes mellitus without complications: Secondary | ICD-10-CM | POA: Diagnosis not present

## 2021-08-17 DIAGNOSIS — Z89512 Acquired absence of left leg below knee: Secondary | ICD-10-CM | POA: Diagnosis not present

## 2021-08-17 DIAGNOSIS — Y792 Prosthetic and other implants, materials and accessory orthopedic devices associated with adverse incidents: Secondary | ICD-10-CM | POA: Diagnosis not present

## 2021-08-17 DIAGNOSIS — Z9181 History of falling: Secondary | ICD-10-CM | POA: Diagnosis not present

## 2021-08-17 DIAGNOSIS — M6281 Muscle weakness (generalized): Secondary | ICD-10-CM | POA: Diagnosis not present

## 2021-08-17 NOTE — Progress Notes (Signed)
Office Visit Note   Patient: Brian Martin           Date of Birth: 1956/03/31           MRN: [id number] Visit Date: 08/12/2021              Requested by: Marco Collie, MD [place],  [place] [address] PCP: Marco Collie, MD  Chief Complaint  Patient presents with   Right Foot - Wound Check      HPI: The patient is a 65 year old gentleman who is referred to [place] by [facility] for evaluation of new ulcer to his right foot. This began on 07/13/21.  He is status post left below-knee amputation.  He has been residing at skilled nursing following recent hospitalization is unsure how the ulcer on his right foot started he wonders if it is from pressure from his shoe wear.  They have been doing Silvadene dressing changes daily  Assessment & Plan: Visit Diagnoses: No diagnosis found.  Plan: venous ulcer left foot.  Iodosorb dressing applied today.  Have given wound care instructions to skilled nursing they may continue with Silvadene dressing changes.  Does not provided for pressure relief we will also refer him back to the vein and vascular.  Follow-Up Instructions: Return in about 2 weeks (around 08/26/2021).   Ortho Exam  Patient is alert, oriented, no adenopathy, well-dressed, normal affect, normal respiratory effort. On examination of the left foot over the medial border he has a centimeter in diameter ulceration this is 5 mm deep and filled in with 100% fibrinous exudative tissue there is scant serous drainage very mild surrounding erythema no warmth no odor  Dampened monophasic pulse with doppler.  Imaging: No results found.   Labs: Lab Results  Component Value Date   HGBA1C 12.9 (H) 04/06/2021   HGBA1C 8.7 (H) 02/18/2016   HGBA1C 11.3 (H) 06/14/2013   ESRSEDRATE 71 (H) 02/18/2016   CRP 3.7 (H) 04/07/2021   CRP 5.3 (H) 02/18/2016   LABURIC 6.8 02/19/2016   REPTSTATUS 04/10/2021 FINAL 04/08/2021   GRAMSTAIN  07/05/2010    RARE WBC PRESENT, PREDOMINANTLY PMN NO SQUAMOUS  EPITHELIAL CELLS SEEN NO ORGANISMS SEEN   CULT (A) 04/08/2021    STAPHYLOCOCCUS EPIDERMIDIS THE SIGNIFICANCE OF ISOLATING THIS ORGANISM FROM A SINGLE SET OF BLOOD CULTURES WHEN MULTIPLE SETS ARE DRAWN IS UNCERTAIN. PLEASE NOTIFY THE MICROBIOLOGY DEPARTMENT WITHIN ONE WEEK IF SPECIATION AND SENSITIVITIES ARE REQUIRED. Performed at [facility], [place] [address], [place], [place] [address]    LABORGA STREPTOCOCCUS GROUP G 06/27/2010     Lab Results  Component Value Date   ALBUMIN 3.8 04/06/2021   ALBUMIN 3.2 (L) 06/13/2013   ALBUMIN 3.6 09/19/2010    Lab Results  Component Value Date   MG 1.8 04/13/2021   MG 1.7 04/11/2021   MG 1.7 04/09/2021   No results found for: VD25OH  No results found for: PREALBUMIN CBC EXTENDED Latest Ref Rng & Units 04/13/2021 04/11/2021 04/09/2021  WBC 4.0 - 10.5 K/uL 13.3(H) 10.1 12.7(H)  RBC 4.22 - 5.81 MIL/uL 4.83 5.04 5.17  HGB 13.0 - 17.0 g/dL 15.5 16.1 16.5  HCT 39.0 - 52.0 % 45.8 47.5 49.2  PLT 150 - 400 K/uL 166 166 182  NEUTROABS 1.7 - 7.7 K/uL - - -  LYMPHSABS 0.7 - 4.0 K/uL - - -     There is no height or weight on file to calculate BMI.  Orders:  No orders of the defined types were placed  in this encounter.  No orders of the defined types were placed in this encounter.    Procedures: No procedures performed  Clinical Data: No additional findings.  ROS:  All other systems negative, except as noted in the HPI. Review of Systems  Objective: Vital Signs: There were no vitals taken for this visit.  Specialty Comments:  No specialty comments available.  PMFS History: Patient Active Problem List   Diagnosis Date Noted   Acute metabolic encephalopathy 84/13/2440   Diabetic ketoacidosis without coma associated with type 2 diabetes mellitus ([place]) 04/07/2021   Leukocytosis 04/07/2021   Mixed hyperlipidemia 04/07/2021   Cellulitis 02/18/2016   Chronic diastolic CHF (congestive heart failure) ([place]) 05/05/2015   Encounter  for therapeutic drug monitoring 11/21/2013   Chronic anticoagulation 06/14/2013   GIB (gastrointestinal bleeding) 06/13/2013   Morbid obesity ([place]) 02/17/2013   Open wound of knee, leg (except thigh), and ankle, without mention of complication 06/24/2535   DIABETES MELLITUS, TYPE II 08/12/2010   CARDIOMYOPATHY, DILATED 08/12/2010   Chronic kidney disease, stage 3a ([place]) 08/12/2010   BKA, LEFT LEG 08/12/2010   Essential hypertension 08/09/2010   AF (paroxysmal atrial fibrillation) ([place]) 08/09/2010   Past Medical History:  Diagnosis Date   Amputation of leg (HCC)    Atrial fibrillation (HCC)    CHF (congestive heart failure) (HCC)    chronic systolic CHF   Chronic renal failure    Diabetes mellitus    type 2   Hypertension     Family History  Problem Relation Age of Onset   Diabetes Other     Past Surgical History:  Procedure Laterality Date   BELOW KNEE LEG AMPUTATION  06/29/2010   Left    COLONOSCOPY N/A 06/15/2013   Procedure: COLONOSCOPY;  Surgeon: Lear Ng, MD;  Location: [facility];  Service: Endoscopy;  Laterality: N/A;   ESOPHAGOGASTRODUODENOSCOPY N/A 06/14/2013   Procedure: ESOPHAGOGASTRODUODENOSCOPY (EGD);  Surgeon: Lear Ng, MD;  Location: [facility] END[facility]COPY;  Service: Endoscopy;  Laterality: N/A;   Social History   Occupational History   Not on file  Tobacco Use   Smoking status: Former    Types: Cigarettes    Quit date: 08/28/2001    Years since quitting: 19.9   Smokeless tobacco: Never  Substance and Sexual Activity   Alcohol use: No   Drug use: Yes    Types: Marijuana    Comment: no marijuana in the past month   Sexual activity: Not on file

## 2021-08-18 ENCOUNTER — Telehealth: Payer: Self-pay | Admitting: Orthopedic Surgery

## 2021-08-18 DIAGNOSIS — R296 Repeated falls: Secondary | ICD-10-CM | POA: Diagnosis not present

## 2021-08-18 NOTE — Telephone Encounter (Signed)
[place] with [facility] and she advised that the assisted living would be able to change the silvadene dressing to the right ankle inbetween the dates that [facility] would go out. Advised ok to see pt twice a week.

## 2021-08-18 NOTE — Telephone Encounter (Signed)
Home health called about pt. Wondering if they could decrease nurse visits to twice a week. Pt has very minimal drainage.   CB [phone]

## 2021-08-19 DIAGNOSIS — M6281 Muscle weakness (generalized): Secondary | ICD-10-CM | POA: Diagnosis not present

## 2021-08-19 DIAGNOSIS — Z89512 Acquired absence of left leg below knee: Secondary | ICD-10-CM | POA: Diagnosis not present

## 2021-08-19 DIAGNOSIS — Y792 Prosthetic and other implants, materials and accessory orthopedic devices associated with adverse incidents: Secondary | ICD-10-CM | POA: Diagnosis not present

## 2021-08-19 DIAGNOSIS — E119 Type 2 diabetes mellitus without complications: Secondary | ICD-10-CM | POA: Diagnosis not present

## 2021-08-19 DIAGNOSIS — L89892 Pressure ulcer of other site, stage 2: Secondary | ICD-10-CM | POA: Diagnosis not present

## 2021-08-19 DIAGNOSIS — Z9181 History of falling: Secondary | ICD-10-CM | POA: Diagnosis not present

## 2021-08-19 DIAGNOSIS — Z794 Long term (current) use of insulin: Secondary | ICD-10-CM | POA: Diagnosis not present

## 2021-08-23 DIAGNOSIS — Y792 Prosthetic and other implants, materials and accessory orthopedic devices associated with adverse incidents: Secondary | ICD-10-CM | POA: Diagnosis not present

## 2021-08-23 DIAGNOSIS — Z794 Long term (current) use of insulin: Secondary | ICD-10-CM | POA: Diagnosis not present

## 2021-08-23 DIAGNOSIS — E119 Type 2 diabetes mellitus without complications: Secondary | ICD-10-CM | POA: Diagnosis not present

## 2021-08-23 DIAGNOSIS — L89892 Pressure ulcer of other site, stage 2: Secondary | ICD-10-CM | POA: Diagnosis not present

## 2021-08-23 DIAGNOSIS — Z89512 Acquired absence of left leg below knee: Secondary | ICD-10-CM | POA: Diagnosis not present

## 2021-08-23 DIAGNOSIS — Z9181 History of falling: Secondary | ICD-10-CM | POA: Diagnosis not present

## 2021-08-23 DIAGNOSIS — M6281 Muscle weakness (generalized): Secondary | ICD-10-CM | POA: Diagnosis not present

## 2021-08-24 DIAGNOSIS — M6281 Muscle weakness (generalized): Secondary | ICD-10-CM | POA: Diagnosis not present

## 2021-08-24 DIAGNOSIS — Y792 Prosthetic and other implants, materials and accessory orthopedic devices associated with adverse incidents: Secondary | ICD-10-CM | POA: Diagnosis not present

## 2021-08-24 DIAGNOSIS — L89892 Pressure ulcer of other site, stage 2: Secondary | ICD-10-CM | POA: Diagnosis not present

## 2021-08-24 DIAGNOSIS — E119 Type 2 diabetes mellitus without complications: Secondary | ICD-10-CM | POA: Diagnosis not present

## 2021-08-24 DIAGNOSIS — Z794 Long term (current) use of insulin: Secondary | ICD-10-CM | POA: Diagnosis not present

## 2021-08-24 DIAGNOSIS — Z89512 Acquired absence of left leg below knee: Secondary | ICD-10-CM | POA: Diagnosis not present

## 2021-08-24 DIAGNOSIS — Z9181 History of falling: Secondary | ICD-10-CM | POA: Diagnosis not present

## 2021-08-25 DIAGNOSIS — E1165 Type 2 diabetes mellitus with hyperglycemia: Secondary | ICD-10-CM | POA: Diagnosis not present

## 2021-08-26 DIAGNOSIS — Z89512 Acquired absence of left leg below knee: Secondary | ICD-10-CM | POA: Diagnosis not present

## 2021-08-26 DIAGNOSIS — Y792 Prosthetic and other implants, materials and accessory orthopedic devices associated with adverse incidents: Secondary | ICD-10-CM | POA: Diagnosis not present

## 2021-08-26 DIAGNOSIS — L89892 Pressure ulcer of other site, stage 2: Secondary | ICD-10-CM | POA: Diagnosis not present

## 2021-08-26 DIAGNOSIS — Z9181 History of falling: Secondary | ICD-10-CM | POA: Diagnosis not present

## 2021-08-26 DIAGNOSIS — E119 Type 2 diabetes mellitus without complications: Secondary | ICD-10-CM | POA: Diagnosis not present

## 2021-08-26 DIAGNOSIS — Z794 Long term (current) use of insulin: Secondary | ICD-10-CM | POA: Diagnosis not present

## 2021-08-26 DIAGNOSIS — M6281 Muscle weakness (generalized): Secondary | ICD-10-CM | POA: Diagnosis not present

## 2021-08-30 ENCOUNTER — Inpatient Hospital Stay (HOSPITAL_COMMUNITY): Payer: Medicare Other

## 2021-08-30 ENCOUNTER — Emergency Department (HOSPITAL_COMMUNITY): Payer: Medicare Other

## 2021-08-30 ENCOUNTER — Inpatient Hospital Stay (HOSPITAL_COMMUNITY)
Admission: EM | Admit: 2021-08-30 | Discharge: 2021-09-09 | DRG: 629 | Disposition: A | Discharge status: Skilled Nursing Facility | Payer: Medicare Other | Source: Skilled Nursing Facility | Attending: Internal Medicine | Admitting: Internal Medicine

## 2021-08-30 ENCOUNTER — Telehealth: Payer: Self-pay | Admitting: Orthopedic Surgery

## 2021-08-30 ENCOUNTER — Ambulatory Visit: Payer: Medicare Other | Admitting: Family

## 2021-08-30 DIAGNOSIS — Z743 Need for continuous supervision: Secondary | ICD-10-CM | POA: Diagnosis not present

## 2021-08-30 DIAGNOSIS — I77810 Thoracic aortic ectasia: Secondary | ICD-10-CM | POA: Diagnosis not present

## 2021-08-30 DIAGNOSIS — Z01818 Encounter for other preprocedural examination: Secondary | ICD-10-CM

## 2021-08-30 DIAGNOSIS — Z20822 Contact with and (suspected) exposure to covid-19: Secondary | ICD-10-CM | POA: Diagnosis not present

## 2021-08-30 DIAGNOSIS — Z87891 Personal history of nicotine dependence: Secondary | ICD-10-CM | POA: Diagnosis not present

## 2021-08-30 DIAGNOSIS — E119 Type 2 diabetes mellitus without complications: Secondary | ICD-10-CM | POA: Diagnosis not present

## 2021-08-30 DIAGNOSIS — M19011 Primary osteoarthritis, right shoulder: Secondary | ICD-10-CM | POA: Diagnosis not present

## 2021-08-30 DIAGNOSIS — I700 Atherosclerosis of aorta: Secondary | ICD-10-CM | POA: Diagnosis not present

## 2021-08-30 DIAGNOSIS — Z7189 Other specified counseling: Secondary | ICD-10-CM

## 2021-08-30 DIAGNOSIS — J40 Bronchitis, not specified as acute or chronic: Secondary | ICD-10-CM | POA: Diagnosis not present

## 2021-08-30 DIAGNOSIS — I739 Peripheral vascular disease, unspecified: Secondary | ICD-10-CM | POA: Diagnosis not present

## 2021-08-30 DIAGNOSIS — R296 Repeated falls: Secondary | ICD-10-CM | POA: Diagnosis not present

## 2021-08-30 DIAGNOSIS — E1151 Type 2 diabetes mellitus with diabetic peripheral angiopathy without gangrene: Secondary | ICD-10-CM | POA: Diagnosis present

## 2021-08-30 DIAGNOSIS — I5042 Chronic combined systolic (congestive) and diastolic (congestive) heart failure: Secondary | ICD-10-CM | POA: Diagnosis not present

## 2021-08-30 DIAGNOSIS — R059 Cough, unspecified: Secondary | ICD-10-CM

## 2021-08-30 DIAGNOSIS — Z794 Long term (current) use of insulin: Secondary | ICD-10-CM

## 2021-08-30 DIAGNOSIS — Z89512 Acquired absence of left leg below knee: Secondary | ICD-10-CM

## 2021-08-30 DIAGNOSIS — I248 Other forms of acute ischemic heart disease: Secondary | ICD-10-CM | POA: Diagnosis not present

## 2021-08-30 DIAGNOSIS — I2510 Atherosclerotic heart disease of native coronary artery without angina pectoris: Secondary | ICD-10-CM | POA: Diagnosis present

## 2021-08-30 DIAGNOSIS — I70221 Atherosclerosis of native arteries of extremities with rest pain, right leg: Secondary | ICD-10-CM | POA: Diagnosis present

## 2021-08-30 DIAGNOSIS — I130 Hypertensive heart and chronic kidney disease with heart failure and stage 1 through stage 4 chronic kidney disease, or unspecified chronic kidney disease: Secondary | ICD-10-CM | POA: Diagnosis present

## 2021-08-30 DIAGNOSIS — I70211 Atherosclerosis of native arteries of extremities with intermittent claudication, right leg: Secondary | ICD-10-CM | POA: Diagnosis not present

## 2021-08-30 DIAGNOSIS — I5032 Chronic diastolic (congestive) heart failure: Secondary | ICD-10-CM | POA: Diagnosis not present

## 2021-08-30 DIAGNOSIS — E1141 Type 2 diabetes mellitus with diabetic mononeuropathy: Secondary | ICD-10-CM | POA: Diagnosis not present

## 2021-08-30 DIAGNOSIS — M6281 Muscle weakness (generalized): Secondary | ICD-10-CM | POA: Diagnosis not present

## 2021-08-30 DIAGNOSIS — W19XXXA Unspecified fall, initial encounter: Secondary | ICD-10-CM | POA: Diagnosis not present

## 2021-08-30 DIAGNOSIS — J209 Acute bronchitis, unspecified: Secondary | ICD-10-CM | POA: Diagnosis present

## 2021-08-30 DIAGNOSIS — E876 Hypokalemia: Secondary | ICD-10-CM | POA: Diagnosis present

## 2021-08-30 DIAGNOSIS — S299XXA Unspecified injury of thorax, initial encounter: Secondary | ICD-10-CM | POA: Diagnosis not present

## 2021-08-30 DIAGNOSIS — E1122 Type 2 diabetes mellitus with diabetic chronic kidney disease: Secondary | ICD-10-CM | POA: Diagnosis present

## 2021-08-30 DIAGNOSIS — M2550 Pain in unspecified joint: Secondary | ICD-10-CM | POA: Diagnosis not present

## 2021-08-30 DIAGNOSIS — E785 Hyperlipidemia, unspecified: Secondary | ICD-10-CM | POA: Diagnosis present

## 2021-08-30 DIAGNOSIS — M7989 Other specified soft tissue disorders: Secondary | ICD-10-CM | POA: Diagnosis not present

## 2021-08-30 DIAGNOSIS — I480 Paroxysmal atrial fibrillation: Secondary | ICD-10-CM | POA: Diagnosis present

## 2021-08-30 DIAGNOSIS — L97319 Non-pressure chronic ulcer of right ankle with unspecified severity: Secondary | ICD-10-CM | POA: Diagnosis not present

## 2021-08-30 DIAGNOSIS — E1140 Type 2 diabetes mellitus with diabetic neuropathy, unspecified: Secondary | ICD-10-CM | POA: Diagnosis present

## 2021-08-30 DIAGNOSIS — F32A Depression, unspecified: Secondary | ICD-10-CM | POA: Diagnosis present

## 2021-08-30 DIAGNOSIS — L97529 Non-pressure chronic ulcer of other part of left foot with unspecified severity: Secondary | ICD-10-CM | POA: Diagnosis not present

## 2021-08-30 DIAGNOSIS — M47812 Spondylosis without myelopathy or radiculopathy, cervical region: Secondary | ICD-10-CM | POA: Diagnosis not present

## 2021-08-30 DIAGNOSIS — E782 Mixed hyperlipidemia: Secondary | ICD-10-CM | POA: Diagnosis not present

## 2021-08-30 DIAGNOSIS — M86171 Other acute osteomyelitis, right ankle and foot: Secondary | ICD-10-CM | POA: Diagnosis not present

## 2021-08-30 DIAGNOSIS — E1369 Other specified diabetes mellitus with other specified complication: Secondary | ICD-10-CM | POA: Diagnosis not present

## 2021-08-30 DIAGNOSIS — K209 Esophagitis, unspecified without bleeding: Secondary | ICD-10-CM | POA: Diagnosis not present

## 2021-08-30 DIAGNOSIS — R739 Hyperglycemia, unspecified: Secondary | ICD-10-CM

## 2021-08-30 DIAGNOSIS — M8660 Other chronic osteomyelitis, unspecified site: Secondary | ICD-10-CM | POA: Diagnosis not present

## 2021-08-30 DIAGNOSIS — E11621 Type 2 diabetes mellitus with foot ulcer: Principal | ICD-10-CM | POA: Diagnosis present

## 2021-08-30 DIAGNOSIS — G3184 Mild cognitive impairment, so stated: Secondary | ICD-10-CM | POA: Diagnosis not present

## 2021-08-30 DIAGNOSIS — I420 Dilated cardiomyopathy: Secondary | ICD-10-CM | POA: Diagnosis not present

## 2021-08-30 DIAGNOSIS — R4189 Other symptoms and signs involving cognitive functions and awareness: Secondary | ICD-10-CM | POA: Diagnosis not present

## 2021-08-30 DIAGNOSIS — I10 Essential (primary) hypertension: Secondary | ICD-10-CM | POA: Diagnosis not present

## 2021-08-30 DIAGNOSIS — Z1159 Encounter for screening for other viral diseases: Secondary | ICD-10-CM | POA: Diagnosis not present

## 2021-08-30 DIAGNOSIS — Z79899 Other long term (current) drug therapy: Secondary | ICD-10-CM

## 2021-08-30 DIAGNOSIS — N179 Acute kidney failure, unspecified: Secondary | ICD-10-CM | POA: Diagnosis not present

## 2021-08-30 DIAGNOSIS — R531 Weakness: Secondary | ICD-10-CM | POA: Diagnosis present

## 2021-08-30 DIAGNOSIS — A419 Sepsis, unspecified organism: Secondary | ICD-10-CM | POA: Diagnosis not present

## 2021-08-30 DIAGNOSIS — E1159 Type 2 diabetes mellitus with other circulatory complications: Secondary | ICD-10-CM | POA: Diagnosis not present

## 2021-08-30 DIAGNOSIS — L97519 Non-pressure chronic ulcer of other part of right foot with unspecified severity: Secondary | ICD-10-CM | POA: Diagnosis not present

## 2021-08-30 DIAGNOSIS — L089 Local infection of the skin and subcutaneous tissue, unspecified: Secondary | ICD-10-CM | POA: Diagnosis not present

## 2021-08-30 DIAGNOSIS — M869 Osteomyelitis, unspecified: Secondary | ICD-10-CM

## 2021-08-30 DIAGNOSIS — Z7401 Bed confinement status: Secondary | ICD-10-CM | POA: Diagnosis not present

## 2021-08-30 DIAGNOSIS — L8990 Pressure ulcer of unspecified site, unspecified stage: Secondary | ICD-10-CM | POA: Insufficient documentation

## 2021-08-30 DIAGNOSIS — R911 Solitary pulmonary nodule: Secondary | ICD-10-CM | POA: Diagnosis not present

## 2021-08-30 DIAGNOSIS — M86271 Subacute osteomyelitis, right ankle and foot: Secondary | ICD-10-CM | POA: Diagnosis not present

## 2021-08-30 DIAGNOSIS — Z7951 Long term (current) use of inhaled steroids: Secondary | ICD-10-CM

## 2021-08-30 DIAGNOSIS — E1165 Type 2 diabetes mellitus with hyperglycemia: Secondary | ICD-10-CM | POA: Diagnosis not present

## 2021-08-30 DIAGNOSIS — I110 Hypertensive heart disease with heart failure: Secondary | ICD-10-CM | POA: Diagnosis not present

## 2021-08-30 DIAGNOSIS — N1831 Chronic kidney disease, stage 3a: Secondary | ICD-10-CM | POA: Diagnosis not present

## 2021-08-30 DIAGNOSIS — Z833 Family history of diabetes mellitus: Secondary | ICD-10-CM

## 2021-08-30 DIAGNOSIS — Z01810 Encounter for preprocedural cardiovascular examination: Secondary | ICD-10-CM | POA: Diagnosis not present

## 2021-08-30 DIAGNOSIS — I70233 Atherosclerosis of native arteries of right leg with ulceration of ankle: Secondary | ICD-10-CM | POA: Diagnosis not present

## 2021-08-30 DIAGNOSIS — J45909 Unspecified asthma, uncomplicated: Secondary | ICD-10-CM | POA: Diagnosis not present

## 2021-08-30 DIAGNOSIS — R410 Disorientation, unspecified: Secondary | ICD-10-CM | POA: Diagnosis not present

## 2021-08-30 DIAGNOSIS — Z89612 Acquired absence of left leg above knee: Secondary | ICD-10-CM

## 2021-08-30 DIAGNOSIS — E46 Unspecified protein-calorie malnutrition: Secondary | ICD-10-CM | POA: Diagnosis not present

## 2021-08-30 DIAGNOSIS — E1169 Type 2 diabetes mellitus with other specified complication: Secondary | ICD-10-CM | POA: Diagnosis not present

## 2021-08-30 DIAGNOSIS — Z7901 Long term (current) use of anticoagulants: Secondary | ICD-10-CM

## 2021-08-30 DIAGNOSIS — M47892 Other spondylosis, cervical region: Secondary | ICD-10-CM | POA: Diagnosis not present

## 2021-08-30 DIAGNOSIS — J439 Emphysema, unspecified: Secondary | ICD-10-CM | POA: Diagnosis not present

## 2021-08-30 DIAGNOSIS — I160 Hypertensive urgency: Secondary | ICD-10-CM | POA: Diagnosis not present

## 2021-08-30 DIAGNOSIS — I70235 Atherosclerosis of native arteries of right leg with ulceration of other part of foot: Secondary | ICD-10-CM | POA: Diagnosis not present

## 2021-08-30 DIAGNOSIS — S0990XA Unspecified injury of head, initial encounter: Secondary | ICD-10-CM | POA: Diagnosis not present

## 2021-08-30 DIAGNOSIS — Z28310 Unvaccinated for covid-19: Secondary | ICD-10-CM

## 2021-08-30 DIAGNOSIS — Z5189 Encounter for other specified aftercare: Secondary | ICD-10-CM | POA: Diagnosis not present

## 2021-08-30 DIAGNOSIS — J984 Other disorders of lung: Secondary | ICD-10-CM | POA: Diagnosis not present

## 2021-08-30 LAB — COMPREHENSIVE METABOLIC PANEL
ALT: 18 U/L (ref 0–44)
AST: 44 U/L — ABNORMAL HIGH (ref 15–41)
Albumin: 3.2 g/dL — ABNORMAL LOW (ref 3.5–5.0)
Alkaline Phosphatase: 58 U/L (ref 38–126)
Anion gap: 10 (ref 5–15)
BUN: 31 mg/dL — ABNORMAL HIGH (ref 8–23)
CO2: 23 mmol/L (ref 22–32)
Calcium: 8.5 mg/dL — ABNORMAL LOW (ref 8.9–10.3)
Chloride: 106 mmol/L (ref 98–111)
Creatinine, Ser: 1.44 mg/dL — ABNORMAL HIGH (ref 0.61–1.24)
GFR, Estimated: 54 mL/min — ABNORMAL LOW (ref >60)
Glucose, Bld: 252 mg/dL — ABNORMAL HIGH (ref 70–99)
Potassium: 4 mmol/L (ref 3.5–5.1)
Sodium: 139 mmol/L (ref 135–145)
Total Bilirubin: 0.9 mg/dL (ref 0.3–1.2)
Total Protein: 6.5 g/dL (ref 6.5–8.1)

## 2021-08-30 LAB — CBC WITH DIFFERENTIAL/PLATELET
Abs Immature Granulocytes: 0.06 10*3/uL (ref 0.00–0.07)
Basophils Absolute: 0.1 10*3/uL (ref 0.0–0.1)
Basophils Relative: 0 %
Eosinophils Absolute: 0.2 10*3/uL (ref 0.0–0.5)
Eosinophils Relative: 1 %
HCT: 34.3 % — ABNORMAL LOW (ref 39.0–52.0)
Hemoglobin: 10.8 g/dL — ABNORMAL LOW (ref 13.0–17.0)
Immature Granulocytes: 0 %
Lymphocytes Relative: 9 %
Lymphs Abs: 1.5 10*3/uL (ref 0.7–4.0)
MCH: 30.9 pg (ref 26.0–34.0)
MCHC: 31.5 g/dL (ref 30.0–36.0)
MCV: 98 fL (ref 80.0–100.0)
Monocytes Absolute: 1.3 10*3/uL — ABNORMAL HIGH (ref 0.1–1.0)
Monocytes Relative: 8 %
Neutro Abs: 13 10*3/uL — ABNORMAL HIGH (ref 1.7–7.7)
Neutrophils Relative %: 82 %
Platelets: 241 10*3/uL (ref 150–400)
RBC: 3.5 MIL/uL — ABNORMAL LOW (ref 4.22–5.81)
RDW: 13.1 % (ref 11.5–15.5)
WBC: 16.1 10*3/uL — ABNORMAL HIGH (ref 4.0–10.5)
nRBC: 0 % (ref 0.0–0.2)

## 2021-08-30 LAB — URINALYSIS, MICROSCOPIC (REFLEX)
Bacteria, UA: NONE SEEN
WBC, UA: NONE SEEN WBC/hpf (ref 0–5)

## 2021-08-30 LAB — URINALYSIS, ROUTINE W REFLEX MICROSCOPIC
Bilirubin Urine: NEGATIVE
Glucose, UA: NEGATIVE mg/dL
Hgb urine dipstick: NEGATIVE
Ketones, ur: NEGATIVE mg/dL
Leukocytes,Ua: NEGATIVE
Nitrite: NEGATIVE
Protein, ur: 30 mg/dL — AB
Specific Gravity, Urine: 1.025 (ref 1.005–1.030)
pH: 5.5 (ref 5.0–8.0)

## 2021-08-30 LAB — RESP PANEL BY RT-PCR (FLU A&B, COVID) ARPGX2
Influenza A by PCR: NEGATIVE
Influenza B by PCR: NEGATIVE
SARS Coronavirus 2 by RT PCR: NEGATIVE

## 2021-08-30 LAB — TROPONIN I (HIGH SENSITIVITY)
Troponin I (High Sensitivity): 23 ng/L — ABNORMAL HIGH (ref <18)
Troponin I (High Sensitivity): 22 ng/L — ABNORMAL HIGH (ref <18)

## 2021-08-30 LAB — LACTIC ACID, PLASMA
Lactic Acid, Venous: 1.7 mmol/L (ref 0.5–1.9)
Lactic Acid, Venous: 1.4 mmol/L (ref 0.5–1.9)

## 2021-08-30 LAB — CBG MONITORING, ED: Glucose-Capillary: 158 mg/dL — ABNORMAL HIGH (ref 70–99)

## 2021-08-30 LAB — HEMOGLOBIN A1C
Hgb A1c MFr Bld: 7 % — ABNORMAL HIGH (ref 4.8–5.6)
Mean Plasma Glucose: 154.2 mg/dL

## 2021-08-30 LAB — BRAIN NATRIURETIC PEPTIDE: B Natriuretic Peptide: 117.2 pg/mL — ABNORMAL HIGH (ref 0.0–100.0)

## 2021-08-30 LAB — TSH: TSH: 1.194 u[IU]/mL (ref 0.350–4.500)

## 2021-08-30 MED ORDER — DOCUSATE SODIUM 100 MG PO CAPS
100.0000 mg | ORAL_CAPSULE | Freq: Two times a day (BID) | ORAL | Status: DC
  Administered 2021-08-30 – 2021-09-09 (×14): 100 mg via ORAL
  Filled 2021-08-30 (×17): qty 1

## 2021-08-30 MED ORDER — VANCOMYCIN HCL 1750 MG/350ML IV SOLN
1750.0000 mg | Freq: Two times a day (BID) | INTRAVENOUS | Status: DC
  Administered 2021-08-31: 1750 mg via INTRAVENOUS
  Filled 2021-08-30 (×2): qty 350

## 2021-08-30 MED ORDER — APIXABAN 5 MG PO TABS
5.0000 mg | ORAL_TABLET | Freq: Two times a day (BID) | ORAL | Status: DC
  Administered 2021-08-30 – 2021-08-31 (×2): 5 mg via ORAL
  Filled 2021-08-30 (×2): qty 1

## 2021-08-30 MED ORDER — INSULIN ASPART 100 UNIT/ML IJ SOLN
3.0000 [IU] | Freq: Three times a day (TID) | INTRAMUSCULAR | Status: DC
  Administered 2021-08-30 – 2021-09-09 (×14): 3 [IU] via SUBCUTANEOUS

## 2021-08-30 MED ORDER — FUROSEMIDE 20 MG PO TABS
20.0000 mg | ORAL_TABLET | Freq: Every day | ORAL | Status: DC
  Administered 2021-08-31 – 2021-09-06 (×6): 20 mg via ORAL
  Filled 2021-08-30 (×6): qty 1

## 2021-08-30 MED ORDER — INSULIN ASPART 100 UNIT/ML IJ SOLN
0.0000 [IU] | Freq: Three times a day (TID) | INTRAMUSCULAR | Status: DC
  Administered 2021-08-31: 3 [IU] via SUBCUTANEOUS
  Administered 2021-08-31: 17:00:00 2 [IU] via SUBCUTANEOUS
  Administered 2021-09-01: 3 [IU] via SUBCUTANEOUS
  Administered 2021-09-01: 18:00:00 5 [IU] via SUBCUTANEOUS
  Administered 2021-09-02: 3 [IU] via SUBCUTANEOUS
  Administered 2021-09-03: 2 [IU] via SUBCUTANEOUS
  Administered 2021-09-03: 12:00:00 5 [IU] via SUBCUTANEOUS
  Administered 2021-09-03: 07:00:00 2 [IU] via SUBCUTANEOUS
  Administered 2021-09-04: 3 [IU] via SUBCUTANEOUS
  Administered 2021-09-04: 2 [IU] via SUBCUTANEOUS
  Administered 2021-09-04: 5 [IU] via SUBCUTANEOUS
  Administered 2021-09-05: 2 [IU] via SUBCUTANEOUS
  Administered 2021-09-06: 5 [IU] via SUBCUTANEOUS
  Administered 2021-09-06: 3 [IU] via SUBCUTANEOUS
  Administered 2021-09-06: 2 [IU] via SUBCUTANEOUS
  Administered 2021-09-07: 3 [IU] via SUBCUTANEOUS
  Administered 2021-09-07: 2 [IU] via SUBCUTANEOUS
  Administered 2021-09-08 (×3): 3 [IU] via SUBCUTANEOUS
  Administered 2021-09-09: 2 [IU] via SUBCUTANEOUS
  Administered 2021-09-09: 3 [IU] via SUBCUTANEOUS

## 2021-08-30 MED ORDER — VANCOMYCIN HCL 2000 MG/400ML IV SOLN
2000.0000 mg | Freq: Once | INTRAVENOUS | Status: AC
  Administered 2021-08-30: 2000 mg via INTRAVENOUS
  Filled 2021-08-30: qty 400

## 2021-08-30 MED ORDER — SERTRALINE HCL 25 MG PO TABS
25.0000 mg | ORAL_TABLET | Freq: Every day | ORAL | Status: DC
  Administered 2021-08-31 – 2021-09-09 (×8): 25 mg via ORAL
  Filled 2021-08-30 (×8): qty 1

## 2021-08-30 MED ORDER — PIPERACILLIN-TAZOBACTAM 3.375 G IVPB
3.3750 g | Freq: Three times a day (TID) | INTRAVENOUS | Status: DC
  Administered 2021-08-30 – 2021-09-01 (×7): 3.375 g via INTRAVENOUS
  Filled 2021-08-30 (×8): qty 50

## 2021-08-30 MED ORDER — ACETAMINOPHEN 325 MG PO TABS: 650.0000 mg | ORAL_TABLET | Freq: Four times a day (QID) | ORAL | Status: DC | PRN

## 2021-08-30 MED ORDER — ONDANSETRON HCL 4 MG/2ML IJ SOLN: 4.0000 mg | Freq: Four times a day (QID) | INTRAMUSCULAR | Status: DC | PRN

## 2021-08-30 MED ORDER — GUAIFENESIN ER 600 MG PO TB12
1200.0000 mg | ORAL_TABLET | Freq: Two times a day (BID) | ORAL | Status: DC
  Administered 2021-08-30 – 2021-09-09 (×15): 1200 mg via ORAL
  Filled 2021-08-30 (×17): qty 2

## 2021-08-30 MED ORDER — CARVEDILOL 25 MG PO TABS
25.0000 mg | ORAL_TABLET | Freq: Two times a day (BID) | ORAL | Status: DC
  Administered 2021-08-30 – 2021-09-09 (×18): 25 mg via ORAL
  Filled 2021-08-30 (×6): qty 1
  Filled 2021-08-30: qty 2
  Filled 2021-08-30 (×12): qty 1

## 2021-08-30 MED ORDER — BENZONATATE 100 MG PO CAPS
200.0000 mg | ORAL_CAPSULE | Freq: Three times a day (TID) | ORAL | Status: AC
  Administered 2021-08-30 – 2021-09-06 (×16): 200 mg via ORAL
  Filled 2021-08-30 (×17): qty 2

## 2021-08-30 MED ORDER — CEFAZOLIN SODIUM-DEXTROSE 1-4 GM/50ML-% IV SOLN
1.0000 g | Freq: Three times a day (TID) | INTRAVENOUS | Status: DC
  Administered 2021-08-30: 1 g via INTRAVENOUS
  Filled 2021-08-30: qty 50

## 2021-08-30 MED ORDER — INSULIN GLARGINE-YFGN 100 UNIT/ML ~~LOC~~ SOLN
25.0000 [IU] | Freq: Every morning | SUBCUTANEOUS | Status: DC
  Administered 2021-08-31 – 2021-09-09 (×7): 25 [IU] via SUBCUTANEOUS
  Filled 2021-08-30 (×10): qty 0.25

## 2021-08-30 MED ORDER — SODIUM CHLORIDE 0.9 % IV SOLN: Freq: Once | INTRAVENOUS | Status: AC

## 2021-08-30 MED ORDER — ONDANSETRON HCL 4 MG PO TABS: 4.0000 mg | ORAL_TABLET | Freq: Four times a day (QID) | ORAL | Status: DC | PRN

## 2021-08-30 MED ORDER — ALBUTEROL SULFATE (2.5 MG/3ML) 0.083% IN NEBU: 2.5000 mg | INHALATION_SOLUTION | Freq: Four times a day (QID) | RESPIRATORY_TRACT | Status: DC | PRN

## 2021-08-30 MED ORDER — CEPHALEXIN 250 MG PO CAPS
500.0000 mg | ORAL_CAPSULE | Freq: Once | ORAL | Status: AC
  Administered 2021-08-30: 500 mg via ORAL
  Filled 2021-08-30: qty 2

## 2021-08-30 MED ORDER — PANTOPRAZOLE SODIUM 40 MG PO TBEC: 40.0000 mg | DELAYED_RELEASE_TABLET | Freq: Every day | ORAL | Status: DC

## 2021-08-30 MED ORDER — SIMVASTATIN 20 MG PO TABS
10.0000 mg | ORAL_TABLET | Freq: Every day | ORAL | Status: DC
  Administered 2021-08-31 – 2021-09-03 (×3): 10 mg via ORAL
  Filled 2021-08-30 (×3): qty 1

## 2021-08-30 MED ORDER — LISINOPRIL 10 MG PO TABS
10.0000 mg | ORAL_TABLET | Freq: Every day | ORAL | Status: DC
  Administered 2021-08-31 – 2021-09-03 (×3): 10 mg via ORAL
  Filled 2021-08-30 (×3): qty 1

## 2021-08-30 MED ORDER — ACETAMINOPHEN 650 MG RE SUPP: 650.0000 mg | Freq: Four times a day (QID) | RECTAL | Status: DC | PRN

## 2021-08-30 MED ORDER — LORAZEPAM 0.5 MG PO TABS
0.5000 mg | ORAL_TABLET | Freq: Four times a day (QID) | ORAL | Status: DC | PRN
  Filled 2021-08-30: qty 1

## 2021-08-30 MED ORDER — PANTOPRAZOLE SODIUM 40 MG PO TBEC
40.0000 mg | DELAYED_RELEASE_TABLET | Freq: Two times a day (BID) | ORAL | Status: DC
  Administered 2021-08-31 – 2021-09-09 (×16): 40 mg via ORAL
  Filled 2021-08-30 (×17): qty 1

## 2021-08-30 MED ORDER — MOMETASONE FURO-FORMOTEROL FUM 200-5 MCG/ACT IN AERO
2.0000 | INHALATION_SPRAY | Freq: Two times a day (BID) | RESPIRATORY_TRACT | Status: DC
  Administered 2021-08-31 – 2021-09-09 (×16): 2 via RESPIRATORY_TRACT
  Filled 2021-08-30 (×2): qty 8.8

## 2021-08-30 NOTE — ED Notes (Signed)
Sullivan Lone (Daughter) called for a status update on [place]. [name] ask for an update when there is info to tell. Daughter ask for the nurse or doctor to check PT foot for a infection. Call her back at [phone].

## 2021-08-30 NOTE — Progress Notes (Signed)
Pharmacy Antibiotic Note  Brian Martin is a 66 y.o. male admitted on 08/30/2021 with  wound infection . He presented to the ED with altered mental status and after a fall. Pharmacy has been consulted for vancomycin and Zosyn dosing.  WBC is elevated at 16.1, patient is afebrile. Scr is slightly elevated at 1.44 (baseline appears to be 1.22 from 03/2021).  Plan: Start Zosyn IV 3.75 g q 8h   IV Vancomycin 2000 mg x 1 Start IV Vancomyxin 1750 mg q 12 h (estimated AUC 482) Goal AUC 400-550 Monitor for signs of clinical improvement Monitor renal function  Temp (24hrs), Avg:98.7 F (37.1 C), Min:98.7 F (37.1 C), Max:98.7 F (37.1 C)  Recent Labs  Lab 08/30/21 1213 08/30/21 1540  WBC  --  16.1*  CREATININE 1.44*  --     CrCl cannot be calculated (Unknown ideal weight.).    No Known Allergies  Antimicrobials this admission: 08/30/21 Vancomycin >>  08/30/21 Zosyn >>  08/30/21 Cephalexin x 1 08/30/21 Cefazolin x 1  Dose adjustments this admission: None  Microbiology results: 08/30/21 BCx: pending  Thank you for involving pharmacy in this patient's care.  Elita Quick, PharmD PGY1 Ambulatory Care Pharmacy Resident 08/30/2021 5:48 PM  **Pharmacist phone directory can be found on [email or web address] listed under [place]**

## 2021-08-30 NOTE — Telephone Encounter (Signed)
[facility] called and states pt has been admitted to hospital today due to him not being about to feel his leg.  CB [phone]

## 2021-08-30 NOTE — Telephone Encounter (Signed)
noted 

## 2021-08-30 NOTE — ED Triage Notes (Signed)
Pt brought to EMS from [facility] via [facility] for fall, confusion. Staff at SNF reports patient fell around 1AM and is confused from baseline. Patient alert and oriented to self and place in ED, denies pain. Left BKA, cough present.  EMS v/s: 96% room air 85 HR 112/64 280 CBG

## 2021-08-30 NOTE — ED Notes (Signed)
Pt transported to [facility] to follow.

## 2021-08-30 NOTE — ED Notes (Signed)
Patient instructed to use call light when he needs to urinate so urine sample can be obtained

## 2021-08-30 NOTE — ED Provider Notes (Signed)
[facility] EMERGENCY DEPARTMENT Provider Note   CSN: [id number] Arrival date & time: 08/30/21  6378     History  Chief Complaint  Patient presents with   Brian Martin is a 66 y.o. male.  Patient is a 66 yo male presenting from [facility] via EMS for fall. Reported fall at 1 AM, new confusion. Unknown head trauma. Unknown LOC. No blood thinner use. Of note, patient admits to left foot wound with drainage and redness. Patient coughing on exam. No other complaints at this time.    The history is provided by the patient and the EMS personnel. No language interpreter was used.  Fall Pertinent negatives include no chest pain, no abdominal pain and no shortness of breath.      Home Medications Prior to Admission medications   Medication Sig Start Date End Date Taking? Authorizing Provider  albuterol (PROVENTIL) (2.5 MG/3ML) 0.083% nebulizer solution Take 2.5 mg by nebulization every 6 (six) hours as needed for wheezing or shortness of breath.    [provider]  apixaban (ELIQUIS) 5 MG TABS tablet Take 1 tablet (5 mg total) by mouth 2 (two) times daily. NEEDS APPOINTMENT FOR FUTURE REFILLS 10/25/20   Bensimhon, Shaune Pascal, MD  Ascorbic Acid (VITAMIN C) 100 MG tablet Take 100 mg by mouth daily.    [provider]  budesonide-formoterol (SYMBICORT) 160-4.5 MCG/ACT inhaler Inhale 2 puffs into the lungs 2 (two) times daily.    [provider]  carvedilol (COREG) 25 MG tablet Take 25 mg by mouth 2 (two) times daily with a meal.    [provider]  docusate sodium (COLACE) 100 MG capsule Take 1 capsule (100 mg total) by mouth 2 (two) times daily. 02/21/16   Kelvin Cellar, MD  furosemide (LASIX) 20 MG tablet Take 1 tablet (20 mg total) by mouth daily. 07/03/14   Rande Brunt, CRNA  HUMALOG KWIKPEN 100 UNIT/ML KwikPen Inject into the skin. 08/25/21   [provider]  insulin aspart (NOVOLOG) 100 UNIT/ML injection Inject  3 Units into the skin 3 (three) times daily with meals. 04/13/21 04/13/22  Pokhrel, Corrie Mckusick, MD  insulin glargine (LANTUS) 100 UNIT/ML injection Inject 0.25 mLs (25 Units total) into the skin at bedtime. 04/13/21   Pokhrel, Corrie Mckusick, MD  lisinopril (PRINIVIL,ZESTRIL) 10 MG tablet Take 10 mg by mouth daily.    [provider]  pantoprazole (PROTONIX) 40 MG tablet Take 1 tablet (40 mg total) by mouth daily. 06/16/13   Ivor Costa, MD  sertraline (ZOLOFT) 25 MG tablet Take 25 mg by mouth daily.    [provider]  simvastatin (ZOCOR) 10 MG tablet Take 10 mg by mouth daily.    [provider]      Allergies    Patient has no known allergies.    Review of Systems   Review of Systems  Constitutional:  Negative for chills and fever.  HENT:  Negative for ear pain and sore throat.   Eyes:  Negative for pain and visual disturbance.  Respiratory:  Positive for cough. Negative for shortness of breath.   Cardiovascular:  Negative for chest pain and palpitations.  Gastrointestinal:  Negative for abdominal pain and vomiting.  Genitourinary:  Negative for dysuria and hematuria.  Musculoskeletal:  Negative for arthralgias and back pain.  Skin:  Positive for wound. Negative for color change and rash.  Neurological:  Negative for seizures and syncope.       Fall  Psychiatric/Behavioral:  Positive for confusion.   All other systems reviewed and are negative.  Physical Exam Updated Vital Signs BP (!) 142/86    Pulse 83    Temp 98.7 F (37.1 C) (Oral)    Resp 20    SpO2 97%  Physical Exam Vitals and nursing note reviewed.  Constitutional:      General: He is not in acute distress.    Appearance: He is well-developed.  HENT:     Head: Normocephalic and atraumatic.  Eyes:     Conjunctiva/sclera: Conjunctivae normal.  Cardiovascular:     Rate and Rhythm: Regular rhythm. Tachycardia present.     Heart sounds: No murmur heard. Pulmonary:     Effort: Pulmonary effort is normal.  No respiratory distress.     Breath sounds: Examination of the right-middle field reveals rales. Examination of the left-middle field reveals rales. Examination of the right-lower field reveals rales. Examination of the left-lower field reveals rales. Rales present.  Abdominal:     Palpations: Abdomen is soft.     Tenderness: There is no abdominal tenderness.  Musculoskeletal:        General: No swelling.     Cervical back: Neck supple. No bony tenderness.     Thoracic back: No bony tenderness.     Lumbar back: No bony tenderness.     Comments: Left below the knee amputee   Skin:    General: Skin is warm and dry.     Capillary Refill: Capillary refill takes less than 2 seconds.       Neurological:     Mental Status: He is alert. He is confused.     GCS: GCS eye subscore is 4. GCS verbal subscore is 5. GCS motor subscore is 6.     Cranial Nerves: Cranial nerves 2-12 are intact.     Sensory: Sensation is intact.     Motor: Motor function is intact.  Psychiatric:        Mood and Affect: Mood normal.    ED Results / Procedures / Treatments   Labs (all labs ordered are listed, but only abnormal results are displayed) Labs Reviewed  COMPREHENSIVE METABOLIC PANEL - Abnormal; Notable for the following components:      Result Value   Glucose, Bld 252 (*)    BUN 31 (*)    Creatinine, Ser 1.44 (*)    Calcium 8.5 (*)    Albumin 3.2 (*)    AST 44 (*)    GFR, Estimated 54 (*)    All other components within normal limits  BRAIN NATRIURETIC PEPTIDE - Abnormal; Notable for the following components:   B Natriuretic Peptide 117.2 (*)    All other components within normal limits  CBC WITH DIFFERENTIAL/PLATELET - Abnormal; Notable for the following components:   WBC 16.1 (*)    RBC 3.50 (*)    Hemoglobin 10.8 (*)    HCT 34.3 (*)    Neutro Abs 13.0 (*)    Monocytes Absolute 1.3 (*)    All other components within normal limits  TROPONIN I (HIGH SENSITIVITY) - Abnormal; Notable for the  following components:   Troponin I (High Sensitivity) 23 (*)    All other components within normal limits  TROPONIN I (HIGH SENSITIVITY) - Abnormal; Notable for the following components:   Troponin I (High Sensitivity) 22 (*)    All other components within normal limits  RESP PANEL BY RT-PCR (FLU A&B, COVID) ARPGX2  CULTURE, BLOOD (ROUTINE X 2)  CULTURE,  BLOOD (ROUTINE X 2)  TSH  URINALYSIS, ROUTINE W REFLEX MICROSCOPIC  LACTIC ACID, PLASMA  LACTIC ACID, PLASMA    EKG None  Radiology CT Head Wo Contrast  Result Date: 08/30/2021 CLINICAL DATA:  Trauma, fall EXAM: CT HEAD WITHOUT CONTRAST TECHNIQUE: Contiguous axial images were obtained from the base of the skull through the vertex without intravenous contrast. COMPARISON:  06/10/2021 FINDINGS: Brain: No acute intracranial findings are seen. There are no signs of bleeding. Cortical sulci are prominent. There is decreased density in the periventricular and subcortical white matter. There are possible small old lacunar infarcts in basal ganglia and periventricular regions. Cavum septum pellucidum and cavum septum vergae are seen. There is no shift of midline structures. No significant interval changes are noted. Vascular: Unremarkable. Skull: Unremarkable. Sinuses/Orbits: Unremarkable. Other: None IMPRESSION: No acute intracranial findings are seen. Atrophy. Small-vessel disease. Electronically Signed   By: Elmer Picker M.D.   On: 08/30/2021 12:11   CT Cervical Spine Wo Contrast  Result Date: 08/30/2021 CLINICAL DATA:  Trauma EXAM: CT CERVICAL SPINE WITHOUT CONTRAST TECHNIQUE: Multidetector CT imaging of the cervical spine was performed without intravenous contrast. Multiplanar CT image reconstructions were also generated. COMPARISON:  None. FINDINGS: Alignment: Alignment of posterior margins of vertebral bodies is unremarkable. Skull base and vertebrae: No recent fracture is seen. Small smooth marginated calcification noted adjacent to the  tip of spinous process of C7 vertebra may be residual from previous injury. Soft tissues and spinal canal: Prevertebral soft tissues are unremarkable. There is no significant spinal stenosis. Disc levels: Degenerative changes are noted at C5-C6 level with mild encroachment of neural foramina. Upper chest: Multiple blebs are seen in both Other: Scattered arterial calcifications are seen. IMPRESSION: No recent fracture is seen in the cervical spine. Cervical spondylosis at C5-C6 level with mild encroachment of neural foramina. Electronically Signed   By: Elmer Picker M.D.   On: 08/30/2021 12:18   DG Chest Portable 1 View  Result Date: 08/30/2021 CLINICAL DATA:  Trauma, fall EXAM: PORTABLE CHEST 1 VIEW COMPARISON:  04/06/2021 FINDINGS: Transverse diameter of heart is slightly increased. Thoracic aorta is tortuous and ectatic. There are no signs of alveolar pulmonary edema or focal pulmonary consolidation. Linear densities in the left lower lung fields may suggest crowding of bronchovascular structures or subsegmental atelectasis. There is no pleural effusion or pneumothorax. Degenerative changes are noted in the right shoulder. IMPRESSION: There are no signs of pulmonary edema or focal pulmonary consolidation. Linear densities in the left lower lung fields may suggest crowding of normal bronchovascular structures or subsegmental atelectasis. Electronically Signed   By: Elmer Picker M.D.   On: 08/30/2021 11:42   DG Foot 2 Views Right  Result Date: 08/30/2021 CLINICAL DATA:  Trauma, pain and swelling EXAM: RIGHT FOOT - 2 VIEW COMPARISON:  02/18/2016 FINDINGS: No fracture or dislocation is seen. Small plantar spur is seen in calcaneus. There are no focal lytic lesions. If there is clinical suspicion for osteomyelitis, three-phase bone scan or MRI may be considered. Vascular calcifications are seen in the soft tissues. IMPRESSION: No significant radiographic abnormality is seen in the right foot.  Electronically Signed   By: Elmer Picker M.D.   On: 08/30/2021 11:45    Procedures .Critical Care Performed by: Lianne Cure, DO Authorized by: Lianne Cure, DO   Critical care provider statement:    Critical care time (minutes):  30   Critical care was necessary to treat or prevent imminent or life-threatening deterioration of the following  conditions:  Sepsis   Critical care was time spent personally by me on the following activities:  Development of treatment plan with patient or surrogate, discussions with consultants, evaluation of patient's response to treatment, examination of patient, ordering and review of laboratory studies, ordering and review of radiographic studies, ordering and performing treatments and interventions, pulse oximetry, re-evaluation of patient's condition and review of old charts    Medications Ordered in ED Medications  ceFAZolin (ANCEF) IVPB 1 g/50 mL premix (has no administration in time range)  0.9 %  sodium chloride infusion (has no administration in time range)  cephALEXin (KEFLEX) capsule 500 mg (500 mg Oral Given 08/30/21 1148)    ED Course/ Medical Decision Making/ A&P                           Medical Decision Making   66 yo male presenting from [facility] via EMS for fall. On exam patient is alert and able to follow commands. Confusion present. No neurological deficits observed.   Confusion: Likely secondary to sepsis from foot wound. Possibly pneumonia. CT pending. UA pending.  Fall with unknown head trauma: -Denies bony pain at this time  -No evidence of head trauma/wounds -CT head and neck demonstrates no acute process -Stable CXR  Foot pain: Right foot wound with erythema, warmth, and swelling. Concerns for sepsis due to 2 SIRS criteria including tachycardia and leukocytosis with infection source. Foot xray demonstrates no bony involvement. Blood cx and LA ordered. IVF ordered. Ancef started.   Cough: Coughing on exam.  Covid/inf PCR ordered. No pneumonia on CXR. CT chest pending for suspicion of pneumonia.  Patient recommended for admission for sepsis likely secondary to foot wound. CT pending to r/o pneumonia due to cough and abnormal lung exam. Patient accepted by admitting hospitalist.   8:43 PM CT chest demonstrates possible infectious bronchiolitis. Possibly aspiration. Admitting team aware.        Final Clinical Impression(s) / ED Diagnoses Final diagnoses:  Hyperglycemia  Other specified diabetes mellitus with other specified complication, unspecified whether long term insulin use (HCC)  AKI (acute kidney injury) ([place])  Sepsis, due to unspecified organism, unspecified whether acute organ dysfunction present [facility])  Visit for wound check  Cough, unspecified type    Rx / DC Orders ED Discharge Orders     None         Lianne Cure, DO 25/75/05 2047

## 2021-08-30 NOTE — H&P (Signed)
History and Physical    Brian Martin MWU:[id number] DOB: 1955-10-22 DOA: 08/30/2021  PCP: Brian Collie, MD (Confirm with patient/family/NH records and if not entered, this has to be entered at [facility] point of entry) Patient coming from: Assist living.  I have personally briefly reviewed patient's old medical records in [place]  Chief Complaint: Feeling weak  HPI: Brian Martin is a 66 y.o. male with medical history significant of nursing home resident for cognition declining, PVD status post left BKA, PAF on Eliquis, chronic diastolic CHF, IDDM, HTN, presented with frequent falls.  Patient confused, unable to provide much of the history, all history provided patient daughter over the phone. cognition has declined recently and was sent by family to assisted living.  He developed right foot ulcer mid November, was seen by podiatry once and was recommended to see vascular surgery.  He was treated 1 course of p.o. antibiotics in November for the infection the left foot.  Assisted-living has wound care of the left foot wound 3 times a week.  Patient denied any pain right foot, he told me he has been having cough for 3 weeks.  Denies any shortness of breath no chest pain no fever or chills.  As for fall, daughter reported and patient has neuropathy on the right leg and has had frequent falls since August this year.  Family not aware the patient has history of aspiration, reportedly the patient has had cough for a few weeks.  ED Course: Patient was found afebrile, blood pressure stable, blood work WBC 16.7, and work-up showed right foot infected ulcer x-ray showed no foreign body.  CT chest showed esophagitis and bronchitis with food residue suspicious for aspiration.  Review of Systems: As per HPI otherwise 14 point review of systems negative.    Past Medical History:  Diagnosis Date   Amputation of leg ([place])    Atrial fibrillation (HCC)    CHF (congestive heart failure) ([place])     chronic systolic CHF   Chronic renal failure    Diabetes mellitus    type 2   Hypertension     Past Surgical History:  Procedure Laterality Date   BELOW KNEE LEG AMPUTATION  06/29/2010   Left    COLONOSCOPY N/A 06/15/2013   Procedure: COLONOSCOPY;  Surgeon: Lear Ng, MD;  Location: [place];  Service: Endoscopy;  Laterality: N/A;   ESOPHAGOGASTRODUODENOSCOPY N/A 06/14/2013   Procedure: ESOPHAGOGASTRODUODENOSCOPY (EGD);  Surgeon: Lear Ng, MD;  [facility] ENDOSCOPY;  Service: Endoscopy;  Laterality: N/A;     reports that he quit smoking about 20 years ago. His smoking use included cigarettes. He has never used smokeless tobacco. He reports current drug use. Drug: Marijuana. He reports that he does not drink alcohol.  No Known Allergies  Family History  Problem Relation Age of Onset   Diabetes Other      Prior to Admission medications   Medication Sig Start Date End Date Taking? Authorizing Provider  albuterol (PROVENTIL) (2.5 MG/3ML) 0.083% nebulizer solution Take 2.5 mg by nebulization every 6 (six) hours as needed for wheezing or shortness of breath.   Yes [provider]  apixaban (ELIQUIS) 5 MG TABS tablet Take 1 tablet (5 mg total) by mouth 2 (two) times daily. NEEDS APPOINTMENT FOR FUTURE REFILLS 10/25/20  Yes Bensimhon, Shaune Pascal, MD  budesonide-formoterol [facility]) 160-4.5 MCG/ACT inhaler Inhale 2 puffs into the lungs 2 (two) times daily.   Yes [provider]  carvedilol (COREG) 25 MG  tablet Take 25 mg by mouth 2 (two) times daily with a meal.   Yes [provider]  docusate sodium (COLACE) 100 MG capsule Take 1 capsule (100 mg total) by mouth 2 (two) times daily. 02/21/16  Yes Kelvin Cellar, MD  furosemide (LASIX) 20 MG tablet Take 1 tablet (20 mg total) by mouth daily. 07/03/14  Yes Rande Brunt, CRNA  HUMALOG KWIKPEN 100 UNIT/ML KwikPen Inject 3 Units into the skin 3 (three) times daily. 08/25/21  Yes [provider]  insulin glargine (LANTUS SOLOSTAR) 100 UNIT/ML Solostar Pen Inject 25 Units into the skin every morning.   Yes [provider]  lisinopril (PRINIVIL,ZESTRIL) 10 MG tablet Take 10 mg by mouth daily.   Yes [provider]  pantoprazole (PROTONIX) 40 MG tablet Take 1 tablet (40 mg total) by mouth daily. 06/16/13  Yes Ivor Costa, MD  sertraline (ZOLOFT) 25 MG tablet Take 25 mg by mouth daily.   Yes [provider]  simvastatin (ZOCOR) 10 MG tablet Take 10 mg by mouth daily.   Yes [provider]  vitamin C (ASCORBIC ACID) 250 MG tablet Take 250 mg by mouth daily.   Yes [provider]  insulin glargine (LANTUS) 100 UNIT/ML injection Inject 0.25 mLs (25 Units total) into the skin at bedtime. Patient not taking: Reported on 08/30/2021 04/13/21   Flora Lipps, MD    Physical Exam: Vitals:   08/30/21 1530 08/30/21 1545 08/30/21 1600 08/30/21 1715  BP: (!) 145/80 139/76 (!) 142/86 138/78  Pulse: 84 84 83 92  Resp: 20 18 20 14   Temp:      TempSrc:      SpO2: 97% 97% 97% 97%    Constitutional: NAD, calm, comfortable Vitals:   08/30/21 1530 08/30/21 1545 08/30/21 1600 08/30/21 1715  BP: (!) 145/80 139/76 (!) 142/86 138/78  Pulse: 84 84 83 92  Resp: 20 18 20 14   Temp:      TempSrc:      SpO2: 97% 97% 97% 97%   Eyes: PERRL, lids and conjunctivae normal ENMT: Mucous membranes are moist. Posterior pharynx clear of any exudate or lesions.Normal dentition.  Neck: normal, supple, no masses, no thyromegaly Respiratory: clear to auscultation bilaterally, no wheezing, no crackles. Normal respiratory effort. No accessory muscle use.  Cardiovascular: Regular rate and rhythm, no murmurs / rubs / gallops. No extremity edema. 2+ pedal pulses. No carotid bruits.  Abdomen: no tenderness, no masses palpated. No hepatosplenomegaly. Bowel sounds positive.  Musculoskeletal: no clubbing / cyanosis. No joint deformity upper and lower extremities. Good  ROM, no contractures. Normal muscle tone.  Skin: Ulcer right ankle Neurologic: CN 2-12 grossly intact.  Diminished light touch sensation right foot, DTR normal. Strength 5/5 in all 4.  Psychiatric: Normal judgment and insight. Alert and oriented x 3. Normal mood.      Labs on Admission: I have personally reviewed following labs and imaging studies  CBC: Recent Labs  Lab 08/30/21 1540  WBC 16.1*  NEUTROABS 13.0*  HGB 10.8*  HCT 34.3*  MCV 98.0  PLT 030   Basic Metabolic Panel: Recent Labs  Lab 08/30/21 1213  NA 139  K 4.0  CL 106  CO2 23  GLUCOSE 252*  BUN 31*  CREATININE 1.44*  CALCIUM 8.5*   GFR: CrCl cannot be calculated (Unknown ideal weight.). Liver Function Tests: Recent Labs  Lab 08/30/21 1213  AST 44*  ALT 18  ALKPHOS 58  BILITOT 0.9  PROT 6.5  ALBUMIN 3.2*  No results for input(s): LIPASE, AMYLASE in the last 168 hours. No results for input(s): AMMONIA in the last 168 hours. Coagulation Profile: No results for input(s): INR, PROTIME in the last 168 hours. Cardiac Enzymes: No results for input(s): CKTOTAL, CKMB, CKMBINDEX, TROPONINI in the last 168 hours. BNP (last 3 results) No results for input(s): PROBNP in the last 8760 hours. HbA1C: No results for input(s): HGBA1C in the last 72 hours. CBG: No results for input(s): GLUCAP in the last 168 hours. Lipid Profile: No results for input(s): CHOL, HDL, LDLCALC, TRIG, CHOLHDL, LDLDIRECT in the last 72 hours. Thyroid Function Tests: Recent Labs    08/30/21 1213  TSH 1.194   Anemia Panel: No results for input(s): VITAMINB12, FOLATE, FERRITIN, TIBC, IRON, RETICCTPCT in the last 72 hours. Urine analysis:    Component Value Date/Time   COLORURINE YELLOW 08/30/2021 1540   APPEARANCEUR CLEAR 08/30/2021 1540   LABSPEC 1.025 08/30/2021 1540   PHURINE 5.5 08/30/2021 1540   GLUCOSEU NEGATIVE 08/30/2021 1540   HGBUR NEGATIVE 08/30/2021 1540   BILIRUBINUR NEGATIVE 08/30/2021 1540   KETONESUR  NEGATIVE 08/30/2021 1540   PROTEINUR 30 (A) 08/30/2021 1540   UROBILINOGEN 0.2 06/30/2010 1037   NITRITE NEGATIVE 08/30/2021 1540   LEUKOCYTESUR NEGATIVE 08/30/2021 1540    Radiological Exams on Admission: CT Head Wo Contrast  Result Date: 08/30/2021 CLINICAL DATA:  Trauma, fall EXAM: CT HEAD WITHOUT CONTRAST TECHNIQUE: Contiguous axial images were obtained from the base of the skull through the vertex without intravenous contrast. COMPARISON:  06/10/2021 FINDINGS: Brain: No acute intracranial findings are seen. There are no signs of bleeding. Cortical sulci are prominent. There is decreased density in the periventricular and subcortical white matter. There are possible small old lacunar infarcts in basal ganglia and periventricular regions. Cavum septum pellucidum and cavum septum vergae are seen. There is no shift of midline structures. No significant interval changes are noted. Vascular: Unremarkable. Skull: Unremarkable. Sinuses/Orbits: Unremarkable. Other: None IMPRESSION: No acute intracranial findings are seen. Atrophy. Small-vessel disease. Electronically Signed   By: Elmer Picker M.D.   On: 08/30/2021 12:11   CT Chest Wo Contrast  Result Date: 08/30/2021 CLINICAL DATA:  Cough and sepsis.  Normal chest x-ray. EXAM: CT CHEST WITHOUT CONTRAST TECHNIQUE: Multidetector CT imaging of the chest was performed following the standard protocol without IV contrast. COMPARISON:  Radiograph earlier today. FINDINGS: Cardiovascular: The heart is normal in size. Incomplete pericardial calcifications posteroinferior to the left atrium is well as anterior to the right atrium. Mitral annulus and coronary artery calcifications. Aortic atherosclerosis without aortic aneurysm. No periaortic stranding. Mediastinum/Nodes: Diffuse esophageal wall thickening with occasional intraluminal debris. No paraesophageal stranding. Multiple small mediastinal lymph nodes are not enlarged by size criteria. Limited assessment  for hilar adenopathy on this unenhanced exam. Possible 2.1 cm exophytic nodule from the posterior aspect of the right thyroid gland, series 3, image 19, difficult to accurately assess. Alternatively this may represent an enlarged parathyroid gland or lymph node. Lungs/Pleura: Moderate retained debris within the dependent trachea. Mild retained debris within the bilateral mainstem bronchi. Diffuse central bronchial thickening, most prominently affecting the right greater than left lower lobes. Occasional areas of mucous plugging/bronchial occlusion in the segmental right lower lobe. There are small nodular opacities in the dependent right lower lobe, in the region of bronchial occlusion/mucous plugging, series 4 images 101-103. Additional mild patchy nodularity in the left lower lobe series 4, image 107. Subsegmental opacities within the dependent lower lobes may be postobstructive atelectasis. Mild emphysema. Scattered tiny  peripheral calcified granuloma. No dominant pulmonary mass. No pleural fluid. No findings of pulmonary edema. Upper Abdomen: Layering gallstones in the gallbladder. Small subcapsular cyst in the dome of the liver. Fatty atrophy of the pancreas. Symmetric perinephric stranding about the included kidneys. Musculoskeletal: Advanced glenohumeral osteoarthritis on the right. Remote left anterior rib fractures. No acute osseous findings. IMPRESSION: 1. Diffuse esophageal wall thickening with occasional intraluminal debris, suspicious for esophagitis. 2. Layering debris within the trachea and mainstem bronchi. Central bronchial thickening with subsegmental mucous plugging in the right lower lobe. Small nodular opacities in the dependent right lower lobe and to a lesser extent left lower lobe, in the region of bronchial occlusion/mucous plugging. In the setting of sepsis and cough, findings likely represent infectious bronchiolitis. The possibility of aspiration is raised. 3. Mild emphysema. 4. Possible  2.1 cm exophytic nodule from the posterior aspect of the right thyroid gland. Alternatively this may represent an enlarged parathyroid gland or lymph node. Recommend thyroid US (ref: J Am Coll Radiol. 2015 Feb;12(2): 143-50). 5. Aortic atherosclerosis and coronary artery calcifications. 6. Pericardial calcifications, can be seen in the setting of prior pericarditis or constrictive cardiomyopathy. Clinical correlation is recommended, consider further assessment with echocardiogram as clinically indicated. Aortic Atherosclerosis (ICD10-I70.0) and Emphysema (ICD10-J43.9). Electronically Signed   By: Keith Rake M.D.   On: 08/30/2021 17:43   CT Cervical Spine Wo Contrast  Result Date: 08/30/2021 CLINICAL DATA:  Trauma EXAM: CT CERVICAL SPINE WITHOUT CONTRAST TECHNIQUE: Multidetector CT imaging of the cervical spine was performed without intravenous contrast. Multiplanar CT image reconstructions were also generated. COMPARISON:  None. FINDINGS: Alignment: Alignment of posterior margins of vertebral bodies is unremarkable. Skull base and vertebrae: No recent fracture is seen. Small smooth marginated calcification noted adjacent to the tip of spinous process of C7 vertebra may be residual from previous injury. Soft tissues and spinal canal: Prevertebral soft tissues are unremarkable. There is no significant spinal stenosis. Disc levels: Degenerative changes are noted at C5-C6 level with mild encroachment of neural foramina. Upper chest: Multiple blebs are seen in both Other: Scattered arterial calcifications are seen. IMPRESSION: No recent fracture is seen in the cervical spine. Cervical spondylosis at C5-C6 level with mild encroachment of neural foramina. Electronically Signed   By: Elmer Picker M.D.   On: 08/30/2021 12:18   DG Chest Portable 1 View  Result Date: 08/30/2021 CLINICAL DATA:  Trauma, fall EXAM: PORTABLE CHEST 1 VIEW COMPARISON:  04/06/2021 FINDINGS: Transverse diameter of heart is slightly  increased. Thoracic aorta is tortuous and ectatic. There are no signs of alveolar pulmonary edema or focal pulmonary consolidation. Linear densities in the left lower lung fields may suggest crowding of bronchovascular structures or subsegmental atelectasis. There is no pleural effusion or pneumothorax. Degenerative changes are noted in the right shoulder. IMPRESSION: There are no signs of pulmonary edema or focal pulmonary consolidation. Linear densities in the left lower lung fields may suggest crowding of normal bronchovascular structures or subsegmental atelectasis. Electronically Signed   By: Elmer Picker M.D.   On: 08/30/2021 11:42   DG Foot 2 Views Right  Result Date: 08/30/2021 CLINICAL DATA:  Trauma, pain and swelling EXAM: RIGHT FOOT - 2 VIEW COMPARISON:  02/18/2016 FINDINGS: No fracture or dislocation is seen. Small plantar spur is seen in calcaneus. There are no focal lytic lesions. If there is clinical suspicion for osteomyelitis, three-phase bone scan or MRI may be considered. Vascular calcifications are seen in the soft tissues. IMPRESSION: No significant radiographic abnormality is seen in  the right foot. Electronically Signed   By: Elmer Picker M.D.   On: 08/30/2021 11:45    EKG: Independently reviewed.  Sinus, no acute ST changes  Assessment/Plan Principal Problem:   Osteomyelitis (HCC)  (please populate well all problems here in Problem List. (For example, if patient is on BP meds at home and you resume or decide to hold them, it is a problem that needs to be her. Same for CAD, COPD, HLD and so on)  Frequent falls -Likely secondary to worsening of diabetic neuropathy -PT evaluation.  Left foot infected ulcer -Suspicious for osteomyelitis, ordered MRI -Escalate antibiotics to vancomycin and Zosyn for now. -Suspect underlying PVD, ordered ABI and depends on ABI results will consider vascular surgery consult.  Acute bronchitis -Suspect aspiration -Denies any  recent nauseous vomiting, thus suspect uncontrolled GERD given the CT chest finding. -Double dose PPI -Consult speech evaluation  IDDM -Sliding scale and baseline Lantus  CKD stage II -Creatinine level stable, euvolemic.  PAF -In sinus rhythm, continue Eliquis.  DVT prophylaxis: Eliquis Code Status: Full code Family Communication: Daughter over the phone Disposition Plan: Expect more than 2 midnight hospital stay, PT evaluation Consults called: None Admission status: MedSurg admission   Lequita Halt MD [facility] Pager [phone]  08/30/2021, 6:36 PM

## 2021-08-30 NOTE — ED Notes (Signed)
Patient transported to imaging.

## 2021-08-31 ENCOUNTER — Inpatient Hospital Stay (HOSPITAL_COMMUNITY): Payer: Medicare Other

## 2021-08-31 ENCOUNTER — Other Ambulatory Visit: Payer: Self-pay

## 2021-08-31 DIAGNOSIS — K209 Esophagitis, unspecified without bleeding: Secondary | ICD-10-CM

## 2021-08-31 DIAGNOSIS — E119 Type 2 diabetes mellitus without complications: Secondary | ICD-10-CM

## 2021-08-31 DIAGNOSIS — R739 Hyperglycemia, unspecified: Secondary | ICD-10-CM | POA: Diagnosis not present

## 2021-08-31 DIAGNOSIS — I70235 Atherosclerosis of native arteries of right leg with ulceration of other part of foot: Secondary | ICD-10-CM

## 2021-08-31 DIAGNOSIS — Z5189 Encounter for other specified aftercare: Secondary | ICD-10-CM | POA: Diagnosis not present

## 2021-08-31 DIAGNOSIS — Z833 Family history of diabetes mellitus: Secondary | ICD-10-CM

## 2021-08-31 DIAGNOSIS — E1151 Type 2 diabetes mellitus with diabetic peripheral angiopathy without gangrene: Secondary | ICD-10-CM

## 2021-08-31 DIAGNOSIS — I160 Hypertensive urgency: Secondary | ICD-10-CM | POA: Diagnosis not present

## 2021-08-31 DIAGNOSIS — I110 Hypertensive heart disease with heart failure: Secondary | ICD-10-CM

## 2021-08-31 DIAGNOSIS — E1369 Other specified diabetes mellitus with other specified complication: Secondary | ICD-10-CM

## 2021-08-31 DIAGNOSIS — E1141 Type 2 diabetes mellitus with diabetic mononeuropathy: Secondary | ICD-10-CM

## 2021-08-31 DIAGNOSIS — I5032 Chronic diastolic (congestive) heart failure: Secondary | ICD-10-CM

## 2021-08-31 DIAGNOSIS — L97519 Non-pressure chronic ulcer of other part of right foot with unspecified severity: Secondary | ICD-10-CM

## 2021-08-31 DIAGNOSIS — Z87891 Personal history of nicotine dependence: Secondary | ICD-10-CM

## 2021-08-31 DIAGNOSIS — J40 Bronchitis, not specified as acute or chronic: Secondary | ICD-10-CM

## 2021-08-31 LAB — BASIC METABOLIC PANEL
Anion gap: 6 (ref 5–15)
BUN: 28 mg/dL — ABNORMAL HIGH (ref 8–23)
CO2: 23 mmol/L (ref 22–32)
Calcium: 8.1 mg/dL — ABNORMAL LOW (ref 8.9–10.3)
Chloride: 112 mmol/L — ABNORMAL HIGH (ref 98–111)
Creatinine, Ser: 1.23 mg/dL (ref 0.61–1.24)
GFR, Estimated: >60 mL/min (ref >60)
Glucose, Bld: 106 mg/dL — ABNORMAL HIGH (ref 70–99)
Potassium: 3.2 mmol/L — ABNORMAL LOW (ref 3.5–5.1)
Sodium: 141 mmol/L (ref 135–145)

## 2021-08-31 LAB — CBC
HCT: 30.4 % — ABNORMAL LOW (ref 39.0–52.0)
Hemoglobin: 9.8 g/dL — ABNORMAL LOW (ref 13.0–17.0)
MCH: 30.8 pg (ref 26.0–34.0)
MCHC: 32.2 g/dL (ref 30.0–36.0)
MCV: 95.6 fL (ref 80.0–100.0)
Platelets: 196 10*3/uL (ref 150–400)
RBC: 3.18 MIL/uL — ABNORMAL LOW (ref 4.22–5.81)
RDW: 13 % (ref 11.5–15.5)
WBC: 13.8 10*3/uL — ABNORMAL HIGH (ref 4.0–10.5)
nRBC: 0 % (ref 0.0–0.2)

## 2021-08-31 LAB — GLUCOSE, CAPILLARY
Glucose-Capillary: 116 mg/dL — ABNORMAL HIGH (ref 70–99)
Glucose-Capillary: 198 mg/dL — ABNORMAL HIGH (ref 70–99)
Glucose-Capillary: 141 mg/dL — ABNORMAL HIGH (ref 70–99)

## 2021-08-31 LAB — MRSA NEXT GEN BY PCR, NASAL: MRSA by PCR Next Gen: DETECTED — AB

## 2021-08-31 MED ORDER — POTASSIUM CHLORIDE CRYS ER 20 MEQ PO TBCR
40.0000 meq | EXTENDED_RELEASE_TABLET | Freq: Once | ORAL | Status: AC
  Administered 2021-08-31: 40 meq via ORAL
  Filled 2021-08-31: qty 2

## 2021-08-31 MED ORDER — VANCOMYCIN HCL 1750 MG/350ML IV SOLN
1750.0000 mg | INTRAVENOUS | Status: DC
  Administered 2021-09-01 – 2021-09-04 (×3): 1750 mg via INTRAVENOUS
  Filled 2021-08-31 (×4): qty 350

## 2021-08-31 NOTE — Consult Note (Addendum)
Hospital Consult    Reason for Consult:  right leg wounds Requesting Physician:  Roosevelt Locks MRN #:  [id number]  History of Present Illness: This is a 66 y.o. male who presented to the hospital with right foot ulcer and was followed by podiatry and it was recommended he see vascular surgery.   He also had confusion upon admission.  He is on Vanc/Zosyn.  He has hx of left BKA.    Pt resides in a SNF.  He has hx of PAF and is currently on Eliquis.  He has hx of chronic diastolic CHF, DM, HTN and frequent falls since August as he has neuropathy in the right leg.  .   Pt had CT of the chest that revealed esophagitis and bronchitis with food residue suspicious for aspiration   Pt's covid test is negative.  The pt is on a statin for cholesterol management.  The pt is not on a daily aspirin.   Other AC:  Eliquis The pt is on BB, ACEI for hypertension.   The pt is diabetic.   Tobacco hx:  former  Past Medical History:  Diagnosis Date   Amputation of leg ([place])    Atrial fibrillation (HCC)    CHF (congestive heart failure) ([place])    chronic systolic CHF   Chronic renal failure    Diabetes mellitus    type 2   Hypertension     Past Surgical History:  Procedure Laterality Date   BELOW KNEE LEG AMPUTATION  06/29/2010   Left    COLONOSCOPY N/A 06/15/2013   Procedure: COLONOSCOPY;  Surgeon: Lear Ng, MD;  Location: [employer];  Service: Endoscopy;  Laterality: N/A;   ESOPHAGOGASTRODUODENOSCOPY N/A 06/14/2013   Procedure: ESOPHAGOGASTRODUODENOSCOPY (EGD);  Surgeon: Lear Ng, MD;  [facility] ENDOSCOPY;  Service: Endoscopy;  Laterality: N/A;    No Known Allergies  Prior to Admission medications   Medication Sig Start Date End Date Taking? Authorizing Provider  albuterol (PROVENTIL) (2.5 MG/3ML) 0.083% nebulizer solution Take 2.5 mg by nebulization every 6 (six) hours as needed for wheezing or shortness of breath.   Yes [provider]  apixaban (ELIQUIS) 5  MG TABS tablet Take 1 tablet (5 mg total) by mouth 2 (two) times daily. NEEDS APPOINTMENT FOR FUTURE REFILLS 10/25/20  Yes Bensimhon, Shaune Pascal, MD  budesonide-formoterol [facility]) 160-4.5 MCG/ACT inhaler Inhale 2 puffs into the lungs 2 (two) times daily.   Yes [provider]  carvedilol (COREG) 25 MG tablet Take 25 mg by mouth 2 (two) times daily with a meal.   Yes [provider]  docusate sodium (COLACE) 100 MG capsule Take 1 capsule (100 mg total) by mouth 2 (two) times daily. 02/21/16  Yes Kelvin Cellar, MD  furosemide (LASIX) 20 MG tablet Take 1 tablet (20 mg total) by mouth daily. 07/03/14  Yes Rande Brunt, CRNA  HUMALOG KWIKPEN 100 UNIT/ML KwikPen Inject 3 Units into the skin 3 (three) times daily. 08/25/21  Yes [provider]  insulin glargine (LANTUS SOLOSTAR) 100 UNIT/ML Solostar Pen Inject 25 Units into the skin every morning.   Yes [provider]  lisinopril (PRINIVIL,ZESTRIL) 10 MG tablet Take 10 mg by mouth daily.   Yes [provider]  pantoprazole (PROTONIX) 40 MG tablet Take 1 tablet (40 mg total) by mouth daily. 06/16/13  Yes Ivor Costa, MD  sertraline (ZOLOFT) 25 MG tablet Take 25 mg by mouth daily.   Yes [provider]  simvastatin (ZOCOR) 10 MG tablet Take  10 mg by mouth daily.   Yes [provider]  vitamin C (ASCORBIC ACID) 250 MG tablet Take 250 mg by mouth daily.   Yes [provider]  insulin glargine (LANTUS) 100 UNIT/ML injection Inject 0.25 mLs (25 Units total) into the skin at bedtime. Patient not taking: Reported on 08/30/2021 04/13/21   Flora Lipps, MD    Social History   Socioeconomic History   Marital status: Married    Spouse name: Not on file   Number of children: Not on file   Years of education: Not on file   Highest education level: Not on file  Occupational History   Not on file  Tobacco Use   Smoking status: Former    Types: Cigarettes    Quit date: 08/28/2001     Years since quitting: 20.0   Smokeless tobacco: Never  Substance and Sexual Activity   Alcohol use: No   Drug use: Yes    Types: Marijuana    Comment: no marijuana in the past month   Sexual activity: Not on file  Other Topics Concern   Not on file  Social History Narrative   Not on file   Social Determinants of Health   Financial Resource Strain: Not on file  Food Insecurity: Not on file  Transportation Needs: Not on file  Physical Activity: Not on file  Stress: Not on file  Social Connections: Not on file  Intimate Partner Violence: Not on file     Family History  Problem Relation Age of Onset   Diabetes Other     ROS: [x]  Positive   [ ]  Negative   [ ]  All sytems reviewed and are negative  Cardiac: [x]  CHF [x]  afib on Eliqus [x]  HTN   Vascular: [x]  non-healing ulcers  Pulmonary: []  asthma/wheezing []  home O2  Neurologic: []  hx of CVA []  mini stroke   Hematologic: []  hx of cancer  Endocrine:   [x]  diabetes []  thyroid disease  GI []  GERD  GU: []  CKD/renal failure []  HD--[]  M/W/F or []  T/T/S  Psychiatric: [x]  depression-on Zoloft  Musculoskeletal: []  arthritis []  joint pain  Integumentary: [x]  ulcers  Constitutional: []  fever    Physical Examination  Vitals:   08/31/21 0923 08/31/21 1550  BP:  113/61  Pulse:  (!) 102  Resp:  17  Temp:  98.3 F (36.8 C)  SpO2: 98% 90%   Body mass index is 28.53 kg/m.  General:  WDWN in NAD Gait: Not observed HENT: WNL, normocephalic Pulmonary: normal non-labored breathing Cardiac: irregular Abdomen:  soft Skin: without rashes Vascular Exam/Pulses: Left femoral pulse is palpable; right femoral pulse is not palpable Extremities:     Left BKA looks fine Musculoskeletal: no muscle wasting or atrophy  Neurologic: A&O X 3; speech is fluent/normal Psychiatric:  flat   CBC    Component Value Date/Time   WBC 13.8 (H) 08/31/2021 0313   RBC 3.18 (L) 08/31/2021 0313   HGB 9.8 (L)  08/31/2021 0313   HCT 30.4 (L) 08/31/2021 0313   PLT 196 08/31/2021 0313   MCV 95.6 08/31/2021 0313   MCH 30.8 08/31/2021 0313   MCHC 32.2 08/31/2021 0313   RDW 13.0 08/31/2021 0313   LYMPHSABS 1.5 08/30/2021 1540   MONOABS 1.3 (H) 08/30/2021 1540   EOSABS 0.2 08/30/2021 1540   BASOSABS 0.1 08/30/2021 1540    BMET    Component Value Date/Time   NA 141 08/31/2021 0313   K 3.2 (L) 08/31/2021 0313   CL  112 (H) 08/31/2021 0313   CO2 23 08/31/2021 0313   GLUCOSE 106 (H) 08/31/2021 0313   BUN 28 (H) 08/31/2021 0313   CREATININE 1.23 08/31/2021 0313   CALCIUM 8.1 (L) 08/31/2021 0313   GFRNONAA >60 08/31/2021 0313   GFRAA >60 11/21/2017 1001    COAGS: Lab Results  Component Value Date   INR 1.3 (H) 04/08/2021   INR 2.8 04/22/2020   INR 3.0 04/01/2020     Non-Invasive Vascular Imaging:   ABI on 08/31/2021 is 0.54 with monophasic waveforms  MRI 08/31/2021:  IMPRESSION: Motion degraded exam. No evidence of osteomyelitis or drainable soft tissue abscess.   ASSESSMENT/PLAN: This is a 66 y.o. male with RLE non healing wounds  -plan for arteriogram, however, he will need to be off of his Eliquis.  We will discontinue this now and plan on arteriogram for Friday as the schedule allows.  -continue statin  Leontine Locket, PA-C [facility] [phone]   I agree with the above.  I have seen and evaluated the patient.  Briefly this is a 66 year old gentleman status post left leg amputation who states that he has had a right foot ulcer for several weeks.  He underwent vascular imaging that showed decreased ABIs in the 0.5 range with monophasic waveforms.  He does not have evidence of osteomyelitis by MRI.  I discussed with the patient and his wife that the neck step would be to proceed with angiography via a left femoral approach (he has a palpable left femoral pulse but not on the right).  I was planning on doing this tomorrow (Thursday), however he is currently getting  Eliquis and so I will schedule him for Friday.  He will need to be n.p.o. after midnight on Thursday night.  I did discuss with his wife that this is a limb threatening situation.  [name]

## 2021-08-31 NOTE — Consult Note (Incomplete)
[name] NOTE   MRN : [id number]  Reason for Consult: right LE non healing wound Referring Physician: Dr. Cruzita Lederer  History of Present Illness: Brian Martin is a 66 y.o. male with medical history significant of nursing home resident for cognition declining, PVD status post left BKA, PAF on Eliquis, chronic diastolic CHF, IDDM, HTN, presented with frequent falls.   He has cognitive decline and medical information has been taken from the chart.  He has a right medial malleolus wound that began in Nov. 2022.  He resides in SNF.  We have been consulted for symptoms of PAD.  He has a left BKA.  He has had multiple falls since August.  MRI done today is negative for osteomyelitis.      Current Facility-Administered Medications  Medication Dose Route Frequency Provider Last Rate Last Admin   acetaminophen (TYLENOL) tablet 650 mg  650 mg Oral Q6H PRN Wynetta Fines T, MD       Or   acetaminophen (TYLENOL) suppository 650 mg  650 mg Rectal Q6H PRN Wynetta Fines T, MD       albuterol (PROVENTIL) (2.5 MG/3ML) 0.083% nebulizer solution 2.5 mg  2.5 mg Nebulization Q6H PRN Wynetta Fines T, MD       apixaban Arne [place]) tablet 5 mg  5 mg Oral BID Wynetta Fines T, MD   5 mg at 08/31/21 0857   benzonatate (TESSALON) capsule 200 mg  200 mg Oral TID Wynetta Fines T, MD   200 mg at 08/31/21 0858   carvedilol (COREG) tablet 25 mg  25 mg Oral BID WC Wynetta Fines T, MD   25 mg at 08/31/21 0858   docusate sodium (COLACE) capsule 100 mg  100 mg Oral BID Wynetta Fines T, MD   100 mg at 08/31/21 0858   furosemide (LASIX) tablet 20 mg  20 mg Oral Daily Wynetta Fines T, MD   20 mg at 08/31/21 0858   guaiFENesin (MUCINEX) 12 hr tablet 1,200 mg  1,200 mg Oral BID Wynetta Fines T, MD   1,200 mg at 08/31/21 0859   insulin aspart (novoLOG) injection 0-15 Units  0-15 Units Subcutaneous TID WC Wynetta Fines T, MD   3 Units at 08/31/21 1241   insulin aspart (novoLOG) injection 3 Units  3 Units Subcutaneous  TID WC Lequita Halt, MD   3 Units at 08/31/21 1242   insulin glargine-yfgn (SEMGLEE) injection 25 Units  25 Units Subcutaneous q morning Wynetta Fines T, MD   25 Units at 08/31/21 0910   lisinopril (ZESTRIL) tablet 10 mg  10 mg Oral Daily Wynetta Fines T, MD   10 mg at 08/31/21 0901   LORazepam (ATIVAN) tablet 0.5 mg  0.5 mg Oral Q6H PRN Lequita Halt, MD       mometasone-formoterol [facility]) 200-5 MCG/ACT inhaler 2 puff  2 puff Inhalation BID Wynetta Fines T, MD   2 puff at 08/31/21 0923   ondansetron (ZOFRAN) tablet 4 mg  4 mg Oral Q6H PRN Lequita Halt, MD       Or   ondansetron [facility]) injection 4 mg  4 mg Intravenous Q6H PRN Wynetta Fines T, MD       pantoprazole (PROTONIX) EC tablet 40 mg  40 mg Oral BID AC Wynetta Fines T, MD   40 mg at 08/31/21 0857   piperacillin-tazobactam (ZOSYN) IVPB 3.375 g  3.375 g Intravenous [address], [place], [place] 12.5 mL/hr at 08/31/21 1451 3.375 g  at 08/31/21 1451   sertraline (ZOLOFT) tablet 25 mg  25 mg Oral Daily Wynetta Fines T, MD   25 mg at 08/31/21 0901   simvastatin (ZOCOR) tablet 10 mg  10 mg Oral Daily Wynetta Fines T, MD   10 mg at 08/31/21 0857   [START ON 09/01/2021] vancomycin (VANCOREADY) IVPB 1750 mg/350 mL  1,750 mg Intravenous Q24H Pham, Minh Q, RPH-CPP        Pt meds include: Statin :Yes Betablocker: Yes ASA: No Other anticoagulants/antiplatelets: Eliquis  Past Medical History:  Diagnosis Date   Amputation of leg ([place])    Atrial fibrillation ([facility])    CHF (congestive heart failure) ([place])    chronic systolic CHF   Chronic renal failure    Diabetes mellitus    type 2   Hypertension     Past Surgical History:  Procedure Laterality Date   BELOW KNEE LEG AMPUTATION  06/29/2010   Left    COLONOSCOPY N/A 06/15/2013   Procedure: COLONOSCOPY;  Surgeon: Lear Ng, MD;  Location: [place];  Service: Endoscopy;  Laterality: N/A;   ESOPHAGOGASTRODUODENOSCOPY N/A 06/14/2013   Procedure: ESOPHAGOGASTRODUODENOSCOPY (EGD);  Surgeon:  Lear Ng, MD;  Location: [facility] ENDOSCOPY;  Service: Endoscopy;  Laterality: N/A;    Social History Social History   Tobacco Use   Smoking status: Former    Types: Cigarettes    Quit date: 08/28/2001    Years since quitting: 20.0   Smokeless tobacco: Never  Substance Use Topics   Alcohol use: No   Drug use: Yes    Types: Marijuana    Comment: no marijuana in the past month    Family History Family History  Problem Relation Age of Onset   Diabetes Other     No Known Allergies   REVIEW OF SYSTEMS  General: [ ]  Weight loss, [ ]  Fever, [ ]  chills Neurologic: [ ]  Dizziness, [ ]  Blackouts, [ ]  Seizure [ ]  Stroke, [ ]  "Mini stroke", [ ]  Slurred speech, [ ]  Temporary blindness; [ ]  weakness in arms or legs, [ ]  Hoarseness [ ]  Dysphagia Cardiac: [ ]  Chest pain/pressure, [ ]  Shortness of breath at rest [ ]  Shortness of breath with exertion, [ ]  Atrial fibrillation or irregular heartbeat  Vascular: [ ]  Pain in legs with walking, [ ]  Pain in legs at rest, [ ]  Pain in legs at night,  [ ]  Non-healing ulcer, [ ]  Blood clot in vein/DVT,   Pulmonary: [ ]  Home oxygen, [ ]  Productive cough, [ ]  Coughing up blood, [ ]  Asthma,  [ ]  Wheezing [ ]  COPD Musculoskeletal:  [ ]  Arthritis, [ ]  Low back pain, [ ]  Joint pain Hematologic: [ ]  Easy Bruising, [ ]  Anemia; [ ]  Hepatitis Gastrointestinal: [ ]  Blood in stool, [ ]  Gastroesophageal Reflux/heartburn, Urinary: [ x] chronic Kidney disease, [ ]  on HD - [ ]  MWF or [ ]  TTHS, [ ]  Burning with urination, [ ]  Difficulty urinating Skin: [ ]  Rashes, [x ] Wounds Psychological: [ ]  Anxiety, [ ]  Depression  Physical Examination Vitals:   08/30/21 2121 08/31/21 0820 08/31/21 0923 08/31/21 1550  BP: 132/67 (!) 156/77  113/61  Pulse: 82 71  (!) 102  Resp: 18 17    Temp: (!) 97.5 F (36.4 C) 98.4 F (36.9 C)  98.3 F (36.8 C)  TempSrc: Oral Oral  Oral  SpO2: 99% 98% 98% 90%  Weight: 98.1 kg     Height: 6\' 1"  (1.854 m)  Body mass index is  28.53 kg/m.  General:  WDWN in NAD  HENT: WNL Eyes: Pupils equal Pulmonary: normal non-labored breathing , without Rales, rhonchi,  wheezing Cardiac: RRR, without  Murmurs, rubs or gallops; No carotid bruits Abdomen: soft, NT, no masses Skin: no rashes, ulcers noted;  no Gangrene , no cellulitis;    Vascular Exam/Pulses:***   Musculoskeletal: no muscle wasting or atrophy; no edema  Neurologic: A&O X 3; Appropriate Affect ;  SENSATION: normal; MOTOR FUNCTION: 5/5 Symmetric Speech is fluent/normal   Significant Diagnostic Studies: CBC Lab Results  Component Value Date   WBC 13.8 (H) 08/31/2021   HGB 9.8 (L) 08/31/2021   HCT 30.4 (L) 08/31/2021   MCV 95.6 08/31/2021   PLT 196 08/31/2021    BMET    Component Value Date/Time   NA 141 08/31/2021 0313   K 3.2 (L) 08/31/2021 0313   CL 112 (H) 08/31/2021 0313   CO2 23 08/31/2021 0313   GLUCOSE 106 (H) 08/31/2021 0313   BUN 28 (H) 08/31/2021 0313   CREATININE 1.23 08/31/2021 0313   CALCIUM 8.1 (L) 08/31/2021 0313   GFRNONAA >60 08/31/2021 0313   GFRAA >60 11/21/2017 1001   Estimated Creatinine Clearance: 72.9 mL/min (by C-G formula based on SCr of 1.23 mg/dL).  COAG Lab Results  Component Value Date   INR 1.3 (H) 04/08/2021   INR 2.8 04/22/2020   INR 3.0 04/01/2020     Non-Invasive Vascular Imaging:  ABI Findings:  +---------+------------------+-----+-------------------+-------------------  ----+   Right     Rt Pressure (mmHg) Index Waveform            Comment                     +---------+------------------+-----+-------------------+-------------------  ----+   Brachial  155                      triphasic                                      +---------+------------------+-----+-------------------+-------------------  ----+   PTA       84                 0.54  monophasic                                     +---------+------------------+-----+-------------------+-------------------  ----+   DP         72                 0.46  dampened monophasic                            +---------+------------------+-----+-------------------+-------------------  ----+   Great Toe                                              unable to obtain  pressure due to                                                                     bandaging                   +---------+------------------+-----+-------------------+-------------------  ----+   +--------+------------------+-----+---------+-------+   Left     Lt Pressure (mmHg) Index Waveform  Comment   +--------+------------------+-----+---------+-------+   Brachial 152                      triphasic           +--------+------------------+-----+---------+-------+   Summary:  Right: Resting right ankle-brachial index indicates moderate right lower  extremity arterial disease.   Left: AKA.      ASSESSMENT/PLAN:  History of PAD with left BKA Non ambulatory, diminished  cognitive level ABI's demonstrate moderate arterial disease.   Roxy Horseman 08/31/2021 3:58 PM

## 2021-08-31 NOTE — Progress Notes (Signed)
PROGRESS NOTE  Brian Martin HMC:[id number] DOB: Jan 14, 1956 DOA: 08/30/2021 PCP: Marco Collie, MD   LOS: 1 day   Brief Narrative / Interim history: Brian Martin is a 65 y.o. male with medical history significant of nursing home resident for cognition declining, PVD status post left BKA, PAF on Eliquis, chronic diastolic CHF, IDDM, HTN, presented with frequent falls.  He also was complaining of right foot ulcer that was developed from November, completed a course of antibiotics but also remained persistent.  There is also reports about his cognition recently declining and was sent to the assisted living facility by family.  Subjective / 24h Interval events: He has no complaints today  Assessment & Plan: Principal Problem Right foot ulcer-there was initial concern for osteomyelitis, however MRI done today is negative for that.  Obtain ABIs, if significant issues consult vascular surgery.  He was evaluated by podiatry as an outpatient.  Monitor cultures, continue antibiotics  Active Problems Peripheral vascular disease-status post left AKA.  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 fib-in sinus, continue Eliquis, Coreg  Chronic diastolic CHF-euvolemic, continue Coreg, furosemide  Type 2 diabetes mellitus, with hyperglycemia-A1c 7.0.  Continue long-acting and mealtime insulin  CBG (last 3)  Recent Labs    08/30/21 1839 08/31/21 0819 08/31/21 1126  GLUCAP 158* 116* 198*    Scheduled Meds:  apixaban  5 mg Oral BID   benzonatate  200 mg Oral TID   carvedilol  25 mg Oral BID WC   docusate sodium  100 mg Oral BID   furosemide  20 mg Oral Daily   guaiFENesin  1,200 mg Oral BID   insulin aspart  0-15 Units  Subcutaneous TID WC   insulin aspart  3 Units Subcutaneous TID WC   insulin glargine-yfgn  25 Units Subcutaneous q morning   lisinopril  10 mg Oral Daily   mometasone-formoterol  2 puff Inhalation BID   pantoprazole  40 mg Oral BID AC   sertraline  25 mg Oral Daily   simvastatin  10 mg Oral Daily   Continuous Infusions:  piperacillin-tazobactam (ZOSYN)  IV 3.375 g (08/31/21 0546)   vancomycin 1,750 mg (08/31/21 0545)   PRN Meds:.acetaminophen **OR** acetaminophen, albuterol, LORazepam, ondansetron **OR** ondansetron (ZOFRAN) IV  Diet Orders (From admission, onward)     Start     Ordered   08/30/21 1749  Diet heart healthy/carb modified Room service appropriate? Yes; Fluid consistency: Thin  Diet effective now       Question Answer Comment  Diet-HS Snack? Nothing   Room service appropriate? Yes   Fluid consistency: Thin      08/30/21 1749            DVT prophylaxis:  apixaban (ELIQUIS) tablet 5 mg     Code Status: Full Code  Family Communication: no family at bedside   Status is: Inpatient  Remains inpatient appropriate because: ABI pending, leukocytosis, antibiotics   Level of care: Med-Surg  Consultants:  none  Procedures:  none  Microbiology  Negative so far  Antimicrobials: Vancomycin / Zosyn    Objective: Vitals:   08/30/21 1900 08/30/21 2121 08/31/21 0820 08/31/21 0923  BP: (!) 157/85 132/67 (!) 156/77   Pulse: 90 82 71   Resp: 15 18 17    Temp:  (!) 97.5 F (36.4 C)  98.4 F (36.9 C)   TempSrc:  Oral Oral   SpO2: 100% 99% 98% 98%  Weight:  98.1 kg    Height:  6\' 1"  (1.854 m)      Intake/Output Summary (Last 24 hours) at 08/31/2021 1143 Last data filed at 08/30/2021 1852 Gross per 24 hour  Intake --  Output 300 ml  Net -300 ml   Filed Weights   08/30/21 2121  Weight: 98.1 kg    Examination:  Constitutional: NAD Eyes: no scleral icterus ENMT: Mucous membranes are moist.  Neck: normal, supple Respiratory: clear to auscultation  bilaterally, no wheezing, no crackles. Normal respiratory effort.  Cardiovascular: Regular rate and rhythm, no murmurs / rubs / gallops. No LE edema.  Abdomen: non distended, no tenderness. Bowel sounds positive.  Musculoskeletal: no clubbing / cyanosis.  Skin: no rashes Neurologic: non focal   Data Reviewed: I have independently reviewed following labs and imaging studies   CBC: Recent Labs  Lab 08/30/21 1540 08/31/21 0313  WBC 16.1* 13.8*  NEUTROABS 13.0*  --   HGB 10.8* 9.8*  HCT 34.3* 30.4*  MCV 98.0 95.6  PLT 241 967   Basic Metabolic Panel: Recent Labs  Lab 08/30/21 1213 08/31/21 0313  NA 139 141  K 4.0 3.2*  CL 106 112*  CO2 23 23  GLUCOSE 252* 106*  BUN 31* 28*  CREATININE 1.44* 1.23  CALCIUM 8.5* 8.1*   Liver Function Tests: Recent Labs  Lab 08/30/21 1213  AST 44*  ALT 18  ALKPHOS 58  BILITOT 0.9  PROT 6.5  ALBUMIN 3.2*   Coagulation Profile: No results for input(s): INR, PROTIME in the last 168 hours. HbA1C: Recent Labs    08/30/21 1911  HGBA1C 7.0*   CBG: Recent Labs  Lab 08/30/21 1839 08/31/21 0819 08/31/21 1126  GLUCAP 158* 116* 198*    Recent Results (from the past 240 hour(s))  Resp Panel by RT-PCR (Flu A&B, Covid) Nasopharyngeal Swab     Status: None   Collection Time: 08/30/21 11:47 AM   Specimen: Nasopharyngeal Swab; Nasopharyngeal(NP) swabs in vial transport medium  Result Value Ref Range Status   SARS Coronavirus 2 by RT PCR NEGATIVE NEGATIVE Final    Comment: (NOTE) SARS-CoV-2 target nucleic acids are NOT DETECTED.  The SARS-CoV-2 RNA is generally detectable in upper respiratory specimens during the acute phase of infection. The lowest concentration of SARS-CoV-2 viral copies this assay can detect is 138 copies/mL. A negative result does not preclude SARS-Cov-2 infection and should not be used as the sole basis for treatment or other patient management decisions. A negative result may occur with  improper specimen  collection/handling, submission of specimen other than nasopharyngeal swab, presence of viral mutation(s) within the areas targeted by this assay, and inadequate number of viral copies(<138 copies/mL). A negative result must be combined with clinical observations, patient history, and epidemiological information. The expected result is Negative.  Fact Sheet for Patients:  EntrepreneurPulse.com.au  Fact Sheet for Healthcare Providers:  IncredibleEmployment.be  This test is no t yet approved or cleared by the [place] FDA and  has been authorized for detection and/or diagnosis of SARS-CoV-2 by FDA under an Emergency Use Authorization (EUA). This EUA will remain  in effect (meaning this test can be used) for the duration of the COVID-19 declaration under Section 564(b)(1) of the Act, 21 U.S.C.section 360bbb-3(b)(1), unless the authorization is terminated  or revoked sooner.       Influenza A by PCR NEGATIVE NEGATIVE Final  Influenza B by PCR NEGATIVE NEGATIVE Final    Comment: (NOTE) The Xpert Xpress SARS-CoV-2/FLU/RSV plus assay is intended as an aid in the diagnosis of influenza from Nasopharyngeal swab specimens and should not be used as a sole basis for treatment. Nasal washings and aspirates are unacceptable for Xpert Xpress SARS-CoV-2/FLU/RSV testing.  Fact Sheet for Patients: EntrepreneurPulse.com.au  Fact Sheet for Healthcare Providers: IncredibleEmployment.be  This test is not yet approved or cleared by the [place] FDA and has been authorized for detection and/or diagnosis of SARS-CoV-2 by FDA under an Emergency Use Authorization (EUA). This EUA will remain in effect (meaning this test can be used) for the duration of the COVID-19 declaration under Section 564(b)(1) of the Act, 21 U.S.C. section 360bbb-3(b)(1), unless the authorization is terminated or revoked.  Performed at [facility], [place] [address], [place], [place] [address]   Blood culture (routine x 2)     Status: None (Preliminary result)   Collection Time: 08/30/21  4:34 PM   Specimen: BLOOD  Result Value Ref Range Status   Specimen Description BLOOD RIGHT ANTECUBITAL  Final   Special Requests   Final    BOTTLES DRAWN AEROBIC AND ANAEROBIC Blood Culture adequate volume   Culture   Final    NO GROWTH < 24 HOURS Performed at [facility], [place] [address], [place], [place] [address]    Report Status PENDING  Incomplete  Blood culture (routine x 2)     Status: None (Preliminary result)   Collection Time: 08/30/21  4:36 PM   Specimen: BLOOD  Result Value Ref Range Status   Specimen Description BLOOD LEFT ANTECUBITAL  Final   Special Requests   Final    BOTTLES DRAWN AEROBIC AND ANAEROBIC Blood Culture adequate volume   Culture   Final    NO GROWTH < 24 HOURS Performed at [facility], [place] [address], [place], [place] [address]    Report Status PENDING  Incomplete  MRSA Next Gen by PCR, Nasal     Status: Abnormal   Collection Time: 08/31/21  6:01 AM   Specimen: Nasal Mucosa; Nasal Swab  Result Value Ref Range Status   MRSA by PCR Next Gen DETECTED (A) NOT DETECTED Final    Comment: RESULT CALLED TO, READ BACK BY AND VERIFIED WITH: Earl Lites RN, AT [phone] 08/31/21 D. VANHOOK (NOTE) The GeneXpert MRSA Assay (FDA approved for NASAL specimens only), is one component of a comprehensive MRSA colonization surveillance program. It is not intended to diagnose MRSA infection nor to guide or monitor treatment for MRSA infections. Test performance is not FDA approved in patients less than 32 years old. Performed at [facility], [place] [address], [place], [place] [address]      Radiology Studies: CT Head Wo Contrast  Result Date: 08/30/2021 CLINICAL DATA:  Trauma, fall EXAM: CT HEAD WITHOUT CONTRAST TECHNIQUE: Contiguous axial images were obtained from the base of the skull through the  vertex without intravenous contrast. COMPARISON:  06/10/2021 FINDINGS: Brain: No acute intracranial findings are seen. There are no signs of bleeding. Cortical sulci are prominent. There is decreased density in the periventricular and subcortical white matter. There are possible small old lacunar infarcts in basal ganglia and periventricular regions. Cavum septum pellucidum and cavum septum vergae are seen. There is no shift of midline structures. No significant interval changes are noted. Vascular: Unremarkable. Skull: Unremarkable. Sinuses/Orbits: Unremarkable. Other: None IMPRESSION: No acute intracranial findings are seen. Atrophy. Small-vessel  disease. Electronically Signed   By: Elmer Picker M.D.   On: 08/30/2021 12:11   CT Chest Wo Contrast  Result Date: 08/30/2021 CLINICAL DATA:  Cough and sepsis.  Normal chest x-ray. EXAM: CT CHEST WITHOUT CONTRAST TECHNIQUE: Multidetector CT imaging of the chest was performed following the standard protocol without IV contrast. COMPARISON:  Radiograph earlier today. FINDINGS: Cardiovascular: The heart is normal in size. Incomplete pericardial calcifications posteroinferior to the left atrium is well as anterior to the right atrium. Mitral annulus and coronary artery calcifications. Aortic atherosclerosis without aortic aneurysm. No periaortic stranding. Mediastinum/Nodes: Diffuse esophageal wall thickening with occasional intraluminal debris. No paraesophageal stranding. Multiple small mediastinal lymph nodes are not enlarged by size criteria. Limited assessment for hilar adenopathy on this unenhanced exam. Possible 2.1 cm exophytic nodule from the posterior aspect of the right thyroid gland, series 3, image 19, difficult to accurately assess. Alternatively this may represent an enlarged parathyroid gland or lymph node. Lungs/Pleura: Moderate retained debris within the dependent trachea. Mild retained debris within the bilateral mainstem bronchi. Diffuse central  bronchial thickening, most prominently affecting the right greater than left lower lobes. Occasional areas of mucous plugging/bronchial occlusion in the segmental right lower lobe. There are small nodular opacities in the dependent right lower lobe, in the region of bronchial occlusion/mucous plugging, series 4 images 101-103. Additional mild patchy nodularity in the left lower lobe series 4, image 107. Subsegmental opacities within the dependent lower lobes may be postobstructive atelectasis. Mild emphysema. Scattered tiny peripheral calcified granuloma. No dominant pulmonary mass. No pleural fluid. No findings of pulmonary edema. Upper Abdomen: Layering gallstones in the gallbladder. Small subcapsular cyst in the dome of the liver. Fatty atrophy of the pancreas. Symmetric perinephric stranding about the included kidneys. Musculoskeletal: Advanced glenohumeral osteoarthritis on the right. Remote left anterior rib fractures. No acute osseous findings. IMPRESSION: 1. Diffuse esophageal wall thickening with occasional intraluminal debris, suspicious for esophagitis. 2. Layering debris within the trachea and mainstem bronchi. Central bronchial thickening with subsegmental mucous plugging in the right lower lobe. Small nodular opacities in the dependent right lower lobe and to a lesser extent left lower lobe, in the region of bronchial occlusion/mucous plugging. In the setting of sepsis and cough, findings likely represent infectious bronchiolitis. The possibility of aspiration is raised. 3. Mild emphysema. 4. Possible 2.1 cm exophytic nodule from the posterior aspect of the right thyroid gland. Alternatively this may represent an enlarged parathyroid gland or lymph node. Recommend thyroid US (ref: J Am Coll Radiol. 2015 Feb;12(2): 143-50). 5. Aortic atherosclerosis and coronary artery calcifications. 6. Pericardial calcifications, can be seen in the setting of prior pericarditis or constrictive cardiomyopathy. Clinical  correlation is recommended, consider further assessment with echocardiogram as clinically indicated. Aortic Atherosclerosis (ICD10-I70.0) and Emphysema (ICD10-J43.9). Electronically Signed   By: Keith Rake M.D.   On: 08/30/2021 17:43   CT Cervical Spine Wo Contrast  Result Date: 08/30/2021 CLINICAL DATA:  Trauma EXAM: CT CERVICAL SPINE WITHOUT CONTRAST TECHNIQUE: Multidetector CT imaging of the cervical spine was performed without intravenous contrast. Multiplanar CT image reconstructions were also generated. COMPARISON:  None. FINDINGS: Alignment: Alignment of posterior margins of vertebral bodies is unremarkable. Skull base and vertebrae: No recent fracture is seen. Small smooth marginated calcification noted adjacent to the tip of spinous process of C7 vertebra may be residual from previous injury. Soft tissues and spinal canal: Prevertebral soft tissues are unremarkable. There is no significant spinal stenosis. Disc levels: Degenerative changes are noted at C5-C6 level with mild encroachment  of neural foramina. Upper chest: Multiple blebs are seen in both Other: Scattered arterial calcifications are seen. IMPRESSION: No recent fracture is seen in the cervical spine. Cervical spondylosis at C5-C6 level with mild encroachment of neural foramina. Electronically Signed   By: Elmer Picker M.D.   On: 08/30/2021 12:18   MR FOOT RIGHT WO CONTRAST  Result Date: 08/31/2021 CLINICAL DATA:  Osteomyelitis, foot EXAM: MRI OF THE RIGHT FOREFOOT WITHOUT CONTRAST TECHNIQUE: Multiplanar, multisequence MR imaging of the right forefoot was performed. No intravenous contrast was administered. COMPARISON:  None. FINDINGS: Bones/Joint/Cartilage Extensive motion artifact which limits evaluation. There is artifactually increased signal along the lateral forefoot at the fourth and fifth toes and webspace, as evidence by no corresponding signal on STIR. Ligaments Poorly visualized due to motion artifact. Muscles and  Tendons Diffuse atrophy in the foot as is commonly seen in diabetics. No visible tendon tear. Soft tissues There is forefoot soft tissue swelling. No drainable fluid collection. IMPRESSION: Motion degraded exam. No evidence of osteomyelitis or drainable soft tissue abscess. Electronically Signed   By: Maurine Simmering M.D.   On: 08/31/2021 08:25     Marzetta Board, MD, PhD [facility]  Between 7 am - 7 pm I am available, please contact me via Amion (for emergencies) or Securechat (non urgent messages)  Between 7 pm - 7 am I am not available, please contact night coverage MD/APP via Amion

## 2021-08-31 NOTE — Progress Notes (Signed)
ABI has been completed.   Preliminary results in CV Proc.   Brian Martin 08/31/2021 1:30 PM

## 2021-08-31 NOTE — Evaluation (Signed)
Physical Therapy Evaluation Patient Details Name: Brian Martin MRN: [id number] DOB: Mar 01, 1956 Today's Date: 08/31/2021  History of Present Illness  Pt is a 66 y/o M presenting from skilled nursing facility s/p fall with found infection from R foot ulcer and cognitive decline. PMH includes L BKA, CHF, PAF, IDDM, and HTN.  Clinical Impression  Received pt semi-reclined in bed with NT checking vitals. Pt pleasantly confused during session and unable to provide accurate subjective history, but confirmed that he does live in a skilled nursing facility. Pt required increased time and cues for sequencing/technique with mobility. Pt required mod A for bed mobility for RLE management and able to stand from elevated EOB with RW and mod/max A. Pt would benefit from continued PT services to address impairments in strength/ROM, balance, endurance, and safety awareness. Acute PT to cont to follow.      Recommendations for follow up therapy are one component of a multi-disciplinary discharge planning process, led by the attending physician.  Recommendations may be updated based on patient status, additional functional criteria and insurance authorization.  Follow Up Recommendations Skilled nursing-short term rehab (<3 hours/day)    Assistance Recommended at Discharge Frequent or constant Supervision/Assistance  Patient can return home with the following       Equipment Recommendations Rolling walker (2 wheels)  Recommendations for Other Services       Functional Status Assessment Patient has had a recent decline in their functional status and demonstrates the ability to make significant improvements in function in a reasonable and predictable amount of time.     Precautions / Restrictions Precautions Precautions: Fall Precaution Comments: L BKA Restrictions Weight Bearing Restrictions: No      Mobility  Bed Mobility Overal bed mobility: Needs Assistance Bed Mobility: Rolling;Sidelying to  Sit;Sit to Supine Rolling: Supervision Sidelying to sit: Mod assist   Sit to supine: Mod assist   General bed mobility comments: able to roll with HOB elevated and use of bedrails with increased time and cues for technique. Required light mod A for RLE management when sitting EOB and lying back down. Patient Response: Cooperative;Flat affect  Transfers Overall transfer level: Needs assistance Equipment used: Rolling walker (2 wheels) Transfers: Sit to/from Stand Sit to Stand: Max assist           General transfer comment: Unable to stand from elevated EOB with 1 UE on RW and 1 UE on bed, but able to stand with max A on trial 1 with BUE support on RW and with mod A on trial 2 with BUE support on RW.    Ambulation/Gait                  Stairs            Wheelchair Mobility    Modified Rankin (Stroke Patients Only)       Balance Overall balance assessment: Needs assistance Sitting-balance support: Bilateral upper extremity supported;Feet supported Sitting balance-Leahy Scale: Poor Sitting balance - Comments: required min guard for static sitting balance   Standing balance support: Bilateral upper extremity supported (RW) Standing balance-Leahy Scale: Poor Standing balance comment: able to stand ~15 seconds on each trial prior to sitting back down                             Pertinent Vitals/Pain Pain Assessment: 0-10 Pain Score: 3  Pain Location: R foot Pain Descriptors / Indicators: Discomfort;Grimacing;Guarding;Sore Pain Intervention(s): Limited activity within patient's  tolerance;Monitored during session;Repositioned    Home Living Family/patient expects to be discharged to:: Skilled nursing facility                   Additional Comments: reports living there for past 2-3 months but unable to recall name of facility.    Prior Function Prior Level of Function : Independent/Modified Independent;Patient poor historian/Family not  available             Mobility Comments: subjective history unclear. Pt initally reported not using any DME prior to admission, then reported using a cane. ADLs Comments: Pt reports independence with ADLs.     Hand Dominance   Dominant Hand: Left    Extremity/Trunk Assessment        Lower Extremity Assessment Lower Extremity Assessment: RLE deficits/detail;LLE deficits/detail RLE Deficits / Details: not formally tested, grossly 3-/5 RLE: Unable to fully assess due to pain RLE Sensation: decreased light touch;history of peripheral neuropathy RLE Coordination: decreased gross motor LLE Deficits / Details: L BKA, grossly 3/5    Cervical / Trunk Assessment Cervical / Trunk Assessment: Kyphotic  Communication   Communication: No difficulties  Cognition Arousal/Alertness: Awake/alert Behavior During Therapy: Flat affect Overall Cognitive Status: No family/caregiver present to determine baseline cognitive functioning                                 General Comments: subjective history unclear, need to further clarify with family.        General Comments      Exercises     Assessment/Plan    PT Assessment Patient needs continued PT services  PT Problem List Decreased strength;Decreased range of motion;Decreased activity tolerance;Decreased balance;Decreased mobility;Decreased coordination;Decreased cognition;Decreased knowledge of use of DME;Decreased safety awareness;Impaired sensation;Decreased skin integrity;Pain       PT Treatment Interventions DME instruction;Gait training;Functional mobility training;Therapeutic activities;Therapeutic exercise;Balance training;Neuromuscular re-education;Cognitive remediation;Patient/family education;Wheelchair mobility training;Manual techniques;Modalities    PT Goals (Current goals can be found in the Care Plan section)  Acute Rehab PT Goals Patient Stated Goal: not to return to nursing home PT Goal  Formulation: With patient Time For Goal Achievement: 09/14/21 Potential to Achieve Goals: Fair    Frequency Min 2X/week     Co-evaluation               AM-PAC PT "6 Clicks" Mobility  Outcome Measure Help needed turning from your back to your side while in a flat bed without using bedrails?: A Little Help needed moving from lying on your back to sitting on the side of a flat bed without using bedrails?: A Lot Help needed moving to and from a bed to a chair (including a wheelchair)?: A Lot Help needed standing up from a chair using your arms (e.g., wheelchair or bedside chair)?: A Lot Help needed to walk in hospital room?: A Lot Help needed climbing 3-5 steps with a railing? : Total 6 Click Score: 12    End of Session Equipment Utilized During Treatment: Gait belt Activity Tolerance: Patient tolerated treatment well;Patient limited by pain Patient left: in bed;with call bell/phone within reach;with bed alarm set Nurse Communication: Mobility status PT Visit Diagnosis: Unsteadiness on feet (R26.81);Other abnormalities of gait and mobility (R26.89);Repeated falls (R29.6);Muscle weakness (generalized) (M62.81);History of falling (Z91.81);Pain Pain - Right/Left: Right Pain - part of body: Leg    Time: 0272-5366 PT Time Calculation (min) (ACUTE ONLY): 28 min   Charges:   PT Evaluation $PT  Eval Moderate Complexity: 1 Mod PT Treatments $Therapeutic Activity: 8-22 mins        Becky Sax PT, DPT  Blenda Nicely 08/31/2021, 9:43 AM

## 2021-08-31 NOTE — TOC Initial Note (Signed)
Transition of Care [facility]) - Initial/Assessment Note    Patient Details  Name: Brian Martin MRN: [id number] Date of Birth: Oct 11, 1955  [facility] CM/SW Contact:    Joanne Chars, LCSW Phone Number: 08/31/2021, 1:22 PM  Clinical Narrative:    CSW met with pt and wife Brian Martin to discuss DC recommendation for SNF.  Permission given to speak with wife present, also to speak with daughter Brian Martin.  Pt lives at [facility] in [place].  They report he was at [facility] for SNF this fall and went to [facility] afterwards, has been there since.  Pt wife currently staying with daughter Brian Martin.  They describe their own home as "falling apart."  Pt states he does not intend to return to [facility] and is hoping to also go to live with Brian Martin.  Discussed PT recommendation for SNF and pt is agreeable to this, would be interested in returning to [facility].  Choice document given.  Pt has not been vaccinated for covid.  CSW spoke with daughter Brian Martin by phone.  Pt is in long term care at [facility].  Daughter is not able to care for him in her home.  She is aware he does not return but there is no other option currently.  They are hoping to possibly find an ALF in [place] but they have not been able to find one that accepts medicaid.  She is in agreement with SNF admission as well.  She said her parent's home is no in good share, "uninhabitable".              Expected Discharge Plan: Skilled Nursing Facility Barriers to Discharge: Continued Medical Work up, SNF Pending bed offer   Patient Goals and CMS Choice Patient states their goals for this hospitalization and ongoing recovery are:: "move in with my daughter" CMS Medicare.gov Compare Post Acute Care list provided to:: Patient Represenative (must comment) Choice offered to / list presented to : Spouse  Expected Discharge Plan and Services Expected Discharge Plan: [place] Choice: [place] Living arrangements for the past 2 months: [place] [facility] in [place])                                      Prior Living Arrangements/Services Living arrangements for the past 2 months: [facility] ([facility] in [place]) Lives with:: Facility Resident Patient language and need for interpreter reviewed:: Yes Do you feel safe going back to the place where you live?: Yes      Need for Family Participation in Patient Care: Yes (Comment) Care giver support system in place?: Yes (comment) Current home services: Other (comment) (na) Criminal Activity/Legal Involvement Pertinent to Current Situation/Hospitalization: No - Comment as needed  Activities of Daily Living Home Assistive Devices/Equipment: Hospital bed, Hoyer Lift ADL Screening (condition at time of admission) Patient's cognitive ability adequate to safely complete daily activities?: Yes Is the patient deaf or have difficulty hearing?: No Does the patient have difficulty seeing, even when wearing glasses/contacts?: No Does the patient have difficulty concentrating, remembering, or making decisions?: No Patient able to express need for assistance with ADLs?: Yes Does the patient have difficulty dressing or bathing?: Yes Independently performs ADLs?: No Communication: Independent Dressing (OT): Needs assistance Is this a change from baseline?: Pre-admission baseline Grooming: Needs assistance Is this  a change from baseline?: Pre-admission baseline Does the patient have difficulty walking or climbing stairs?: Yes Weakness of Legs: Right Weakness of Arms/Hands: Left  Permission Sought/Granted Permission sought to share information with : Family Supports Permission granted to share information with : Yes, Verbal Permission Granted  Share Information with NAME: wife Brian Martin, daughter Brian Martin  Permission granted to share info w AGENCY: SNF        Emotional  Assessment Appearance:: Appears older than stated age Attitude/Demeanor/Rapport: Engaged Affect (typically observed): Appropriate Orientation: : Oriented to Self, Oriented to Place Alcohol / Substance Use: Not Applicable Psych Involvement: No (comment)  Admission diagnosis:  Osteomyelitis ([place]) [M86.9] Hyperglycemia [R73.9] AKI (acute kidney injury) ([place]) [N17.9] Visit for wound check [Z51.89] Other specified diabetes mellitus with other specified complication, unspecified whether long term insulin use ([place]) [E13.69] Sepsis, due to unspecified organism, unspecified whether acute organ dysfunction present ([place]) [A41.9] Cough, unspecified type [R05.9] Patient Active Problem List   Diagnosis Date Noted   Osteomyelitis ([place]) 08/30/2021   Hyperglycemia    Acute metabolic encephalopathy 05/39/7673   Diabetic ketoacidosis without coma associated with type 2 diabetes mellitus ([place]) 04/07/2021   Leukocytosis 04/07/2021   Mixed hyperlipidemia 04/07/2021   Cellulitis 02/18/2016   Chronic diastolic CHF (congestive heart failure) ([place]) 05/05/2015   Encounter for therapeutic drug monitoring 11/21/2013   Chronic anticoagulation 06/14/2013   GIB (gastrointestinal bleeding) 06/13/2013   Morbid obesity ([place]) 02/17/2013   Open wound of knee, leg (except thigh), and ankle, without mention of complication 41/93/7902   DIABETES MELLITUS, TYPE II 08/12/2010   CARDIOMYOPATHY, DILATED 08/12/2010   Chronic kidney disease, stage 3a ([place]) 08/12/2010   BKA, LEFT LEG 08/12/2010   Essential hypertension 08/09/2010   AF (paroxysmal atrial fibrillation) ([place]) 08/09/2010   PCP:  Marco Collie, MD Pharmacy:   [facility] [address], [place] [address] [place] [address] Phone: [phone] Fax: [phone]     Social Determinants of Health (SDOH) Interventions    Readmission Risk Interventions No flowsheet data found.

## 2021-08-31 NOTE — Evaluation (Signed)
Clinical/Bedside Swallow Evaluation Patient Details  Name: Brian Martin MRN: [id number] Date of Birth: November 24, 1955  Today's Date: 08/31/2021 Time: SLP Start Time (ACUTE ONLY): 1016 SLP Stop Time (ACUTE ONLY): 1035 SLP Time Calculation (min) (ACUTE ONLY): 19 min  Past Medical History:  Past Medical History:  Diagnosis Date   Amputation of leg ([place])    Atrial fibrillation (HCC)    CHF (congestive heart failure) ([place])    chronic systolic CHF   Chronic renal failure    Diabetes mellitus    type 2   Hypertension    Past Surgical History:  Past Surgical History:  Procedure Laterality Date   BELOW KNEE LEG AMPUTATION  06/29/2010   Left    COLONOSCOPY N/A 06/15/2013   Procedure: COLONOSCOPY;  Surgeon: Lear Ng, MD;  Location: [facility];  Service: Endoscopy;  Laterality: N/A;   ESOPHAGOGASTRODUODENOSCOPY N/A 06/14/2013   Procedure: ESOPHAGOGASTRODUODENOSCOPY (EGD);  Surgeon: Lear Ng, MD;  Location: [facility] ENDOSCOPY;  Service: Endoscopy;  Laterality: N/A;   HPI:  Pt is a 66 y/o M presenting from skilled nursing facility s/p fall with infection from R foot ulcer and cognitive decline. CT chest showed esophagitis and bronchitis with food residue suspicious for aspiration. Per MD note, suspect uncontrolled GERD. PMH includes L BKA, CHF, PAF, IDDM, and HTN.    Assessment / Plan / Recommendation  Clinical Impression  Pt's oropharyngeal swallow appears to be [facility] as can be assessed clinically. He does not have any overt signs of dysphagia or aspiration, but he does have frequent eructation. This could be consistent with a primary esophageal component, as MD note suggests there is concern for uncontrolled GERD contributing to CT findings. If pt has had AMS recently this could have also been contributing, but it appears to be appropriate for functional PO intake at this time. Would continue with regular solids and thin liquids with careful use of aspiration and esophageal  precautions. Will f/u briefly given concern for aspiration, although MD could also considering ordering a more dedicated test of the esophagus if there is ongoing concern for possible post-prandial aspiration. SLP Visit Diagnosis: Dysphagia, unspecified (R13.10)    Aspiration Risk  Mild aspiration risk    Diet Recommendation Regular;Thin liquid   Liquid Administration via: Cup;Straw Medication Administration: Whole meds with puree Supervision: Patient able to self feed;Intermittent supervision to cue for compensatory strategies Compensations: Slow rate;Small sips/bites;Follow solids with liquid Postural Changes: Seated upright at 90 degrees;Remain upright for at least 30 minutes after po intake    Other  Recommendations Recommended Consults: Consider esophageal assessment Oral Care Recommendations: Oral care BID    Recommendations for follow up therapy are one component of a multi-disciplinary discharge planning process, led by the attending physician.  Recommendations may be updated based on patient status, additional functional criteria and insurance authorization.  Follow up Recommendations Skilled nursing-short term rehab (<3 hours/day)      Assistance Recommended at Discharge    Functional Status Assessment Patient has had a recent decline in their functional status and demonstrates the ability to make significant improvements in function in a reasonable and predictable amount of time.  Frequency and Duration min 2x/week  1 week       Prognosis Prognosis for Safe Diet Advancement: Good      Swallow Study   General HPI: Pt is a 66 y/o M presenting from skilled nursing facility s/p fall with infection from R foot ulcer and cognitive decline. CT chest showed esophagitis and bronchitis with food  residue suspicious for aspiration. Per MD note, suspect uncontrolled GERD. PMH includes L BKA, CHF, PAF, IDDM, and HTN. Type of Study: Bedside Swallow Evaluation Previous Swallow  Assessment: none in chart Diet Prior to this Study: Regular;Thin liquids Temperature Spikes Noted: No Respiratory Status: Room air History of Recent Intubation: No Behavior/Cognition: Alert;Cooperative;Pleasant mood Oral Cavity Assessment: Within Functional Limits Oral Care Completed by SLP: No Oral Cavity - Dentition: Poor condition;Missing dentition Vision: Functional for self-feeding Self-Feeding Abilities: Able to feed self Patient Positioning: Upright in bed Baseline Vocal Quality: Normal Volitional Cough: Strong Volitional Swallow: Able to elicit    Oral/Motor/Sensory Function Overall Oral Motor/Sensory Function: Within functional limits   Ice Chips Ice chips: Not tested   Thin Liquid Thin Liquid: Within functional limits Presentation: Cup;Self Fed;Straw    Nectar Thick Nectar Thick Liquid: Not tested   Honey Thick Honey Thick Liquid: Not tested   Puree Puree: Within functional limits Presentation: Self Fed;Spoon   Solid     Solid: Within functional limits Presentation: Self Fed      Osie Bond., M.A. [facility] Pager [phone] Office [phone]  08/31/2021,12:30 PM

## 2021-08-31 NOTE — Consult Note (Signed)
[place] Nurse wound consult note Consultation was completed by review of records, images and assistance from the bedside nurse/clinical staff.  Reason for Consult: right foot wound Patient with significant history of PVD/IDDM/CHF with BKA on the left.  ABIs ordered, pending vascular consult based on results. MRI ordered  Wound type: full thickness ulceration right medial foot Pressure Injury POA: NA Measurement: see nursing flow sheet; aprox 1cm x 1cm x 0.2cm  Wound bed:100% yellow  Drainage (amount, consistency, odor) yellow on dressing Periwound: intact; some abrasions noted right lateral ankle, patient sustained fall prior to admission Dressing procedure/placement/frequency:  Need enzymatic debridement however until ABIs obtained to determine vascular status and with history of BKA on the left I am hesitant to do much in the way of debridement.  I will add silver hydrofiber for now to address exudate and await results and further workup.  [place], [place], [place]

## 2021-08-31 NOTE — Progress Notes (Signed)
Pharmacy Antibiotic Note  Brian Martin is a 66 y.o. male admitted on 08/30/2021 with  wound infection . He presented to the ED with altered mental status and after a fall. Pharmacy has been consulted for vancomycin and Zosyn dosing.  WBC is elevated at 16.1, patient is afebrile. Scr is slightly elevated at 1.44 (baseline appears to be 1.22 from 03/2021).  Scr 1.23. We will adjust vanc dose today. MRI neg for osteo.  Plan: Start Zosyn IV 3.75 g q 8h   Change Vancomyxin 1750 mg q24 AUC 414, scr 1.23 Monitor for signs of clinical improvement Monitor renal function  Temp (24hrs), Avg:98 F (36.7 C), Min:97.5 F (36.4 C), Max:98.4 F (36.9 C)  Recent Labs  Lab 08/30/21 1213 08/30/21 1540 08/30/21 1634 08/30/21 1911 08/31/21 0313  WBC  --  16.1*  --   --  13.8*  CREATININE 1.44*  --   --   --  1.23  LATICACIDVEN  --   --  1.7 1.4  --      Estimated Creatinine Clearance: 72.9 mL/min (by C-G formula based on SCr of 1.23 mg/dL).    No Known Allergies  Antimicrobials this admission: 08/30/21 Vancomycin >>  08/30/21 Zosyn >>  08/30/21 Cephalexin x 1 08/30/21 Cefazolin x 1   Microbiology results: 08/30/21 BCx: pending  Onnie Boer, PharmD, BCIDP, AAHIVP, CPP Infectious Disease Pharmacist 08/31/2021 1:33 PM

## 2021-08-31 NOTE — NC FL2 (Signed)
[place] LEVEL OF CARE SCREENING TOOL     IDENTIFICATION  Patient Name: Brian Martin Birthdate: 28-Apr-1956 Sex: male Admission Date (Current Location): 08/30/2021  [place] and [place] Number:  Kathleen Argue [id number] [place] and Address:  [facility] [place], [place] [address], [place], [place] [address]      Provider Number: [id number]  Attending Physician Name and Address:  Caren Griffins, MD  Relative Name and Phone Number:  [place] Lone Daughter   [phone]    Current Level of Care: Hospital Recommended Level of Care: [place] Prior Approval Number:    Date Approved/Denied:   PASRR Number: [id number]  Discharge Plan: SNF    Current Diagnoses: Patient Active Problem List   Diagnosis Date Noted   Osteomyelitis ([place]) 08/30/2021   Hyperglycemia    Acute metabolic encephalopathy 88/41/6606   Diabetic ketoacidosis without coma associated with type 2 diabetes mellitus ([place]) 04/07/2021   Leukocytosis 04/07/2021   Mixed hyperlipidemia 04/07/2021   Cellulitis 02/18/2016   Chronic diastolic CHF (congestive heart failure) ([place]) 05/05/2015   Encounter for therapeutic drug monitoring 11/21/2013   Chronic anticoagulation 06/14/2013   GIB (gastrointestinal bleeding) 06/13/2013   Morbid obesity ([place]) 02/17/2013   Open wound of knee, leg (except thigh), and ankle, without mention of complication 30/16/0109   DIABETES MELLITUS, TYPE II 08/12/2010   CARDIOMYOPATHY, DILATED 08/12/2010   Chronic kidney disease, stage 3a ([place]) 08/12/2010   BKA, LEFT LEG 08/12/2010   Essential hypertension 08/09/2010   AF (paroxysmal atrial fibrillation) ([place]) 08/09/2010    Orientation RESPIRATION BLADDER Height & Weight     Self, Place  Normal Incontinent Weight: 216 lb 4.3 oz (98.1 kg) Height:  6\' 1"  (185.4 cm)  BEHAVIORAL SYMPTOMS/MOOD NEUROLOGICAL BOWEL NUTRITION STATUS      Continent Diet (see discharge summary)  AMBULATORY STATUS  COMMUNICATION OF NEEDS Skin   Total Care Verbally Other (Comment) (amputation: L BKA)                       Personal Care Assistance Level of Assistance  Bathing, Feeding, Dressing Bathing Assistance: Limited assistance Feeding assistance: Limited assistance Dressing Assistance: Limited assistance     Functional Limitations Info  Sight, Hearing, Speech Sight Info: Adequate Hearing Info: Adequate Speech Info: Adequate    SPECIAL CARE FACTORS FREQUENCY  PT (By licensed PT), OT (By licensed OT)     PT Frequency: 5x week OT Frequency: 5x week            Contractures Contractures Info: Not present    Additional Factors Info  Code Status, Allergies, Insulin Sliding Scale Code Status Info: full Allergies Info: NKA   Insulin Sliding Scale Info: Novolog, 0-15 3x day with meals, 3 units 3x day with meals.  See discharge summary.       Current Medications (08/31/2021):  This is the current hospital active medication list Current Facility-Administered Medications  Medication Dose Route Frequency Provider Last Rate Last Admin   acetaminophen (TYLENOL) tablet 650 mg  650 mg Oral Q6H PRN Lequita Halt, MD       Or   acetaminophen (TYLENOL) suppository 650 mg  650 mg Rectal Q6H PRN Wynetta Fines T, MD       albuterol (PROVENTIL) (2.5 MG/3ML) 0.083% nebulizer solution 2.5 mg  2.5 mg Nebulization Q6H PRN Lequita Halt, MD       apixaban [place] [place]) tablet 5 mg  5 mg Oral BID Lequita Halt, MD  5 mg at 08/31/21 0857   benzonatate (TESSALON) capsule 200 mg  200 mg Oral TID Wynetta Fines T, MD   200 mg at 08/31/21 0858   carvedilol (COREG) tablet 25 mg  25 mg Oral BID WC Wynetta Fines T, MD   25 mg at 08/31/21 0858   docusate sodium (COLACE) capsule 100 mg  100 mg Oral BID Wynetta Fines T, MD   100 mg at 08/31/21 0858   furosemide (LASIX) tablet 20 mg  20 mg Oral Daily Wynetta Fines T, MD   20 mg at 08/31/21 0858   guaiFENesin (MUCINEX) 12 hr tablet 1,200 mg  1,200 mg Oral BID Wynetta Fines T, MD    1,200 mg at 08/31/21 0859   insulin aspart (novoLOG) injection 0-15 Units  0-15 Units Subcutaneous TID WC Wynetta Fines T, MD   3 Units at 08/31/21 1241   insulin aspart (novoLOG) injection 3 Units  3 Units Subcutaneous TID WC Lequita Halt, MD   3 Units at 08/31/21 1242   insulin glargine-yfgn (SEMGLEE) injection 25 Units  25 Units Subcutaneous q morning Wynetta Fines T, MD   25 Units at 08/31/21 0910   lisinopril (ZESTRIL) tablet 10 mg  10 mg Oral Daily Wynetta Fines T, MD   10 mg at 08/31/21 0901   LORazepam (ATIVAN) tablet 0.5 mg  0.5 mg Oral Q6H PRN Lequita Halt, MD       mometasone-formoterol [facility]) 200-5 MCG/ACT inhaler 2 puff  2 puff Inhalation BID Wynetta Fines T, MD   2 puff at 08/31/21 0923   ondansetron (ZOFRAN) tablet 4 mg  4 mg Oral Q6H PRN Lequita Halt, MD       Or   ondansetron [facility]) injection 4 mg  4 mg Intravenous Q6H PRN Wynetta Fines T, MD       pantoprazole (PROTONIX) EC tablet 40 mg  40 mg Oral BID AC Wynetta Fines T, MD   40 mg at 08/31/21 0857   piperacillin-tazobactam (ZOSYN) IVPB 3.375 g  3.375 g Intravenous [address], [place] 12.5 mL/hr at 08/31/21 0546 3.375 g at 08/31/21 0546   sertraline (ZOLOFT) tablet 25 mg  25 mg Oral Daily Wynetta Fines T, MD   25 mg at 08/31/21 0901   simvastatin (ZOCOR) tablet 10 mg  10 mg Oral Daily Wynetta Fines T, MD   10 mg at 08/31/21 0857   [START ON 09/01/2021] vancomycin (VANCOREADY) IVPB 1750 mg/350 mL  1,750 mg Intravenous Q24H Onnie Boer Q, RPH-CPP         Discharge Medications: Please see discharge summary for a list of discharge medications.  Relevant Imaging Results:  Relevant Lab Results:   Additional Information SSN: [id number]. Pt is not vaccinated for covid.  Joanne Chars, LCSW

## 2021-09-01 DIAGNOSIS — R739 Hyperglycemia, unspecified: Secondary | ICD-10-CM | POA: Diagnosis not present

## 2021-09-01 DIAGNOSIS — Z5189 Encounter for other specified aftercare: Secondary | ICD-10-CM | POA: Diagnosis not present

## 2021-09-01 DIAGNOSIS — E1369 Other specified diabetes mellitus with other specified complication: Secondary | ICD-10-CM | POA: Diagnosis not present

## 2021-09-01 LAB — BASIC METABOLIC PANEL
Anion gap: 8 (ref 5–15)
BUN: 27 mg/dL — ABNORMAL HIGH (ref 8–23)
CO2: 22 mmol/L (ref 22–32)
Calcium: 8.2 mg/dL — ABNORMAL LOW (ref 8.9–10.3)
Chloride: 107 mmol/L (ref 98–111)
Creatinine, Ser: 1.36 mg/dL — ABNORMAL HIGH (ref 0.61–1.24)
GFR, Estimated: 57 mL/min — ABNORMAL LOW (ref >60)
Glucose, Bld: 180 mg/dL — ABNORMAL HIGH (ref 70–99)
Potassium: 4 mmol/L (ref 3.5–5.1)
Sodium: 137 mmol/L (ref 135–145)

## 2021-09-01 LAB — CBC
HCT: 30.2 % — ABNORMAL LOW (ref 39.0–52.0)
Hemoglobin: 9.7 g/dL — ABNORMAL LOW (ref 13.0–17.0)
MCH: 30.9 pg (ref 26.0–34.0)
MCHC: 32.1 g/dL (ref 30.0–36.0)
MCV: 96.2 fL (ref 80.0–100.0)
Platelets: 215 10*3/uL (ref 150–400)
RBC: 3.14 MIL/uL — ABNORMAL LOW (ref 4.22–5.81)
RDW: 13.1 % (ref 11.5–15.5)
WBC: 11.2 10*3/uL — ABNORMAL HIGH (ref 4.0–10.5)
nRBC: 0 % (ref 0.0–0.2)

## 2021-09-01 LAB — GLUCOSE, CAPILLARY
Glucose-Capillary: 177 mg/dL — ABNORMAL HIGH (ref 70–99)
Glucose-Capillary: 199 mg/dL — ABNORMAL HIGH (ref 70–99)
Glucose-Capillary: 250 mg/dL — ABNORMAL HIGH (ref 70–99)
Glucose-Capillary: 191 mg/dL — ABNORMAL HIGH (ref 70–99)

## 2021-09-01 NOTE — Progress Notes (Signed)
°  Progress Note    09/01/2021 7:32 AM * No surgery date entered *  Subjective:  No complaints or questions this morning   Vitals:   08/31/21 2131 09/01/21 0727  BP: 132/71 137/78  Pulse: 81 73  Resp: 16 17  Temp: 98 F (36.7 C) 97.6 F (36.4 C)  SpO2: 97% 100%   Physical Exam: Lungs:  non labored Extremities:  ankle, toe ulcers R LE stable compared to picture on 1/3 Neurologic: A&O  CBC    Component Value Date/Time   WBC 11.2 (H) 09/01/2021 0332   RBC 3.14 (L) 09/01/2021 0332   HGB 9.7 (L) 09/01/2021 0332   HCT 30.2 (L) 09/01/2021 0332   PLT 215 09/01/2021 0332   MCV 96.2 09/01/2021 0332   MCH 30.9 09/01/2021 0332   MCHC 32.1 09/01/2021 0332   RDW 13.1 09/01/2021 0332   LYMPHSABS 1.5 08/30/2021 1540   MONOABS 1.3 (H) 08/30/2021 1540   EOSABS 0.2 08/30/2021 1540   BASOSABS 0.1 08/30/2021 1540    BMET    Component Value Date/Time   NA 137 09/01/2021 0332   K 4.0 09/01/2021 0332   CL 107 09/01/2021 0332   CO2 22 09/01/2021 0332   GLUCOSE 180 (H) 09/01/2021 0332   BUN 27 (H) 09/01/2021 0332   CREATININE 1.36 (H) 09/01/2021 0332   CALCIUM 8.2 (L) 09/01/2021 0332   GFRNONAA 57 (L) 09/01/2021 0332   GFRAA >60 11/21/2017 1001    INR    Component Value Date/Time   INR 1.3 (H) 04/08/2021 0327   INR 2.7 10/31/2010 0921     Intake/Output Summary (Last 24 hours) at 09/01/2021 0732 Last data filed at 08/31/2021 1500 Gross per 24 hour  Intake 712.76 ml  Output --  Net 712.76 ml     Assessment/Plan:  66 y.o. male with R foot/ankle ulcers  Continue daily dressing changes to R foot Eliquis is being held in preparation for RLE arteriogram tomorrow 1/6 Patient and wife are aware he is at risk for limb loss Consent ordered Npo past midnight   Brian Ligas, PA-C [facility] [phone] 09/01/2021 7:32 AM

## 2021-09-01 NOTE — Progress Notes (Signed)
PROGRESS NOTE  Brian Martin MAU:[id number] DOB: 28-Aug-1956 DOA: 08/30/2021 PCP: Marco Collie, MD   LOS: 2 days   Brief Narrative / Interim history: Brian Martin is a 66 y.o. male with medical history significant of nursing home resident for cognition declining, PVD status post left BKA, PAF on Eliquis, chronic diastolic CHF, IDDM, HTN, presented with frequent falls.  He also was complaining of right foot ulcer that was developed from November, completed a course of antibiotics but also remained persistent.  There is also reports about his cognition recently declining and was sent to the assisted living facility by family.  Subjective / 24h Interval events: Complains of not having bacon with his breakfast  Assessment & Plan: Principal Problem Right foot ulcer-there was initial concern for osteomyelitis, however MRI done on admission was negative for that.  ABIs obtained showed progression of his peripheral vascular disease on the right lower extremity.  Vascular surgery consulted, plan for arteriogram tomorrow.  Hold Eliquis.  N.p.o. after midnight.  Active Problems Peripheral vascular disease-status post left AKA.  Right-sided ABI as above, arteriogram tomorrow  Hypokalemia-repleted, normal this morning  Leukocytosis-due to foot ulcer infection-improving  Chronic kidney disease stage IIIa-Baseline creatinine 1.2-1.5, currently at baseline  Essential hypertension-continue Coreg  Hyperlipidemia-continue statin  Depression-continue Zoloft  CKD 2-creatinine at baseline  Paroxysmal A. fib-in sinus, continue Eliquis, Coreg  Chronic diastolic CHF-euvolemic, continue Coreg, furosemide  Type 2 diabetes mellitus, with hyperglycemia-A1c 7.0.  Continue long-acting and mealtime insulin  CBG (last 3)  Recent Labs    08/31/21 1126 08/31/21 1633 09/01/21 0728  GLUCAP 198* 141* 177*     Scheduled Meds:  benzonatate  200 mg Oral TID   carvedilol  25 mg Oral BID WC   docusate  sodium  100 mg Oral BID   furosemide  20 mg Oral Daily   guaiFENesin  1,200 mg Oral BID   insulin aspart  0-15 Units Subcutaneous TID WC   insulin aspart  3 Units Subcutaneous TID WC   insulin glargine-yfgn  25 Units Subcutaneous q morning   lisinopril  10 mg Oral Daily   mometasone-formoterol  2 puff Inhalation BID   pantoprazole  40 mg Oral BID AC   sertraline  25 mg Oral Daily   simvastatin  10 mg Oral Daily   Continuous Infusions:  piperacillin-tazobactam (ZOSYN)  IV 3.375 g (09/01/21 0547)   vancomycin     PRN Meds:.acetaminophen **OR** acetaminophen, albuterol, LORazepam, ondansetron **OR** ondansetron (ZOFRAN) IV  Diet Orders (From admission, onward)     Start     Ordered   09/02/21 0001  Diet NPO time specified  Diet effective midnight        09/01/21 0735   08/30/21 1749  Diet heart healthy/carb modified Room service appropriate? Yes; Fluid consistency: Thin  Diet effective now       Question Answer Comment  Diet-HS Snack? Nothing   Room service appropriate? Yes   Fluid consistency: Thin      08/30/21 1749            DVT prophylaxis:      Code Status: Full Code  Family Communication: no family at bedside   Status is: Inpatient  Remains inpatient appropriate because: ABI pending, leukocytosis, antibiotics   Level of care: Med-Surg  Consultants:  none  Procedures:  none  Microbiology  Negative so far  Antimicrobials: Vancomycin / Zosyn    Objective: Vitals:   08/31/21 1905 08/31/21 2131 09/01/21 0727 09/01/21 0755  BP:  132/71 137/78   Pulse:  81 73   Resp:  16 17   Temp:  98 F (36.7 C) 97.6 F (36.4 C)   TempSrc:  Oral Oral   SpO2: 97% 97% 100% 97%  Weight:      Height:        Intake/Output Summary (Last 24 hours) at 09/01/2021 1103 Last data filed at 08/31/2021 1500 Gross per 24 hour  Intake 592.76 ml  Output --  Net 592.76 ml    Filed Weights   08/30/21 2121  Weight: 98.1 kg    Examination:  Constitutional: He is in  no distress Eyes: Anicteric ENMT: mmm Neck: normal, supple Respiratory: Clear bilaterally, no wheezing, no crackles, normal respiratory effort Cardiovascular: Regular rate and rhythm, no murmurs, no edema Abdomen: Soft, NT, ND, bowel sounds positive Musculoskeletal: no clubbing / cyanosis.  Skin: No rashes seen Neurologic: No focal deficits  Data Reviewed: I have independently reviewed following labs and imaging studies   CBC: Recent Labs  Lab 08/30/21 1540 08/31/21 0313 09/01/21 0332  WBC 16.1* 13.8* 11.2*  NEUTROABS 13.0*  --   --   HGB 10.8* 9.8* 9.7*  HCT 34.3* 30.4* 30.2*  MCV 98.0 95.6 96.2  PLT 241 196 557    Basic Metabolic Panel: Recent Labs  Lab 08/30/21 1213 08/31/21 0313 09/01/21 0332  NA 139 141 137  K 4.0 3.2* 4.0  CL 106 112* 107  CO2 23 23 22   GLUCOSE 252* 106* 180*  BUN 31* 28* 27*  CREATININE 1.44* 1.23 1.36*  CALCIUM 8.5* 8.1* 8.2*    Liver Function Tests: Recent Labs  Lab 08/30/21 1213  AST 44*  ALT 18  ALKPHOS 58  BILITOT 0.9  PROT 6.5  ALBUMIN 3.2*    Coagulation Profile: No results for input(s): INR, PROTIME in the last 168 hours. HbA1C: Recent Labs    08/30/21 1911  HGBA1C 7.0*    CBG: Recent Labs  Lab 08/30/21 1839 08/31/21 0819 08/31/21 1126 08/31/21 1633 09/01/21 0728  GLUCAP 158* 116* 198* 141* 177*     Recent Results (from the past 240 hour(s))  Resp Panel by RT-PCR (Flu A&B, Covid) Nasopharyngeal Swab     Status: None   Collection Time: 08/30/21 11:47 AM   Specimen: Nasopharyngeal Swab; Nasopharyngeal(NP) swabs in vial transport medium  Result Value Ref Range Status   SARS Coronavirus 2 by RT PCR NEGATIVE NEGATIVE Final    Comment: (NOTE) SARS-CoV-2 target nucleic acids are NOT DETECTED.  The SARS-CoV-2 RNA is generally detectable in upper respiratory specimens during the acute phase of infection. The lowest concentration of SARS-CoV-2 viral copies this assay can detect is 138 copies/mL. A negative  result does not preclude SARS-Cov-2 infection and should not be used as the sole basis for treatment or other patient management decisions. A negative result may occur with  improper specimen collection/handling, submission of specimen other than nasopharyngeal swab, presence of viral mutation(s) within the areas targeted by this assay, and inadequate number of viral copies(<138 copies/mL). A negative result must be combined with clinical observations, patient history, and epidemiological information. The expected result is Negative.  Fact Sheet for Patients:  EntrepreneurPulse.com.au  Fact Sheet for Healthcare Providers:  IncredibleEmployment.be  This test is no t yet approved or cleared by the [place] FDA and  has been authorized for detection and/or diagnosis of SARS-CoV-2 by FDA under an Emergency Use Authorization (EUA). This EUA will remain  in effect (meaning this test can be used) for the  duration of the COVID-19 declaration under Section 564(b)(1) of the Act, 21 U.S.C.section 360bbb-3(b)(1), unless the authorization is terminated  or revoked sooner.       Influenza A by PCR NEGATIVE NEGATIVE Final   Influenza B by PCR NEGATIVE NEGATIVE Final    Comment: (NOTE) The Xpert Xpress SARS-CoV-2/FLU/RSV plus assay is intended as an aid in the diagnosis of influenza from Nasopharyngeal swab specimens and should not be used as a sole basis for treatment. Nasal washings and aspirates are unacceptable for Xpert Xpress SARS-CoV-2/FLU/RSV testing.  Fact Sheet for Patients: EntrepreneurPulse.com.au  Fact Sheet for Healthcare Providers: IncredibleEmployment.be  This test is not yet approved or cleared by the [place] FDA and has been authorized for detection and/or diagnosis of SARS-CoV-2 by FDA under an Emergency Use Authorization (EUA). This EUA will remain in effect (meaning this test can be used)  for the duration of the COVID-19 declaration under Section 564(b)(1) of the Act, 21 U.S.C. section 360bbb-3(b)(1), unless the authorization is terminated or revoked.  Performed at [facility], [place] [address], [place], [place] [address]   Blood culture (routine x 2)     Status: None (Preliminary result)   Collection Time: 08/30/21  4:34 PM   Specimen: BLOOD  Result Value Ref Range Status   Specimen Description BLOOD RIGHT ANTECUBITAL  Final   Special Requests   Final    BOTTLES DRAWN AEROBIC AND ANAEROBIC Blood Culture adequate volume   Culture   Final    NO GROWTH 2 DAYS Performed at [facility], [place] [address], [place], [place] [address]    Report Status PENDING  Incomplete  Blood culture (routine x 2)     Status: None (Preliminary result)   Collection Time: 08/30/21  4:36 PM   Specimen: BLOOD  Result Value Ref Range Status   Specimen Description BLOOD LEFT ANTECUBITAL  Final   Special Requests   Final    BOTTLES DRAWN AEROBIC AND ANAEROBIC Blood Culture adequate volume   Culture   Final    NO GROWTH 2 DAYS Performed at [facility], [place] [address], [place], [place] [address]    Report Status PENDING  Incomplete  MRSA Next Gen by PCR, Nasal     Status: Abnormal   Collection Time: 08/31/21  6:01 AM   Specimen: Nasal Mucosa; Nasal Swab  Result Value Ref Range Status   MRSA by PCR Next Gen DETECTED (A) NOT DETECTED Final    Comment: RESULT CALLED TO, READ BACK BY AND VERIFIED WITH: Earl Lites RN, AT [phone] 08/31/21 D. VANHOOK (NOTE) The GeneXpert MRSA Assay (FDA approved for NASAL specimens only), is one component of a comprehensive MRSA colonization surveillance program. It is not intended to diagnose MRSA infection nor to guide or monitor treatment for MRSA infections. Test performance is not FDA approved in patients less than 74 years old. Performed at [facility], [place] [address] [place], [place] [address]       Radiology Studies: VAS [place]  ABI WITH/WO TBI  Result Date: 08/31/2021  LOWER EXTREMITY DOPPLER STUDY Patient Name:  DEMARIAN EPPS  Date of Exam:   08/31/2021 Medical Rec #: [id number]        Accession #:    [id number] Date of Birth: 09-21-1955        Patient Gender: M Patient Age:   63 years Exam Location:  [facility] Procedure:      VAS [place] ABI WITH/WO TBI Referring Phys:  PING ZHANG --------------------------------------------------------------------------------  High Risk Factors: Hypertension, Diabetes.  Vascular Interventions: LT AKA. Limitations: Today's exam was limited due to PVD w/ intermittent claudication. Comparison Study: 02/20/16 prior Performing Technologist: Archie Patten RVS  Examination Guidelines: A complete evaluation includes at minimum, Doppler waveform signals and systolic blood pressure reading at the level of bilateral brachial, anterior tibial, and posterior tibial arteries, when vessel segments are accessible. Bilateral testing is considered an integral part of a complete examination. Photoelectric Plethysmograph (PPG) waveforms and toe systolic pressure readings are included as required and additional duplex testing as needed. Limited examinations for reoccurring indications may be performed as noted.  ABI Findings: +---------+------------------+-----+-------------------+-----------------------+  Right     Rt Pressure (mmHg) Index Waveform            Comment                  +---------+------------------+-----+-------------------+-----------------------+  Brachial  155                      triphasic                                    +---------+------------------+-----+-------------------+-----------------------+  PTA       84                 0.54  monophasic                                   +---------+------------------+-----+-------------------+-----------------------+  DP        72                 0.46  dampened monophasic                           +---------+------------------+-----+-------------------+-----------------------+  Great Toe                                              unable to obtain                                                                 pressure due to                                                                  bandaging                +---------+------------------+-----+-------------------+-----------------------+ +--------+------------------+-----+---------+-------+  Left     Lt Pressure (mmHg) Index Waveform  Comment  +--------+------------------+-----+---------+-------+  Brachial 152                      triphasic          +--------+------------------+-----+---------+-------+  Summary: Right: Resting right ankle-brachial index indicates moderate right lower extremity arterial disease.  Left: AKA.  *See table(s) above for measurements and observations.  Electronically signed by Harold Barban MD on 08/31/2021 at 11:29:57 PM.    Final      Marzetta Board, MD, PhD [facility]  Between 7 am - 7 pm I am available, please contact me via Amion (for emergencies) or Securechat (non urgent messages)  Between 7 pm - 7 am I am not available, please contact night coverage MD/APP via Amion

## 2021-09-01 NOTE — Progress Notes (Signed)
Speech Language Pathology Treatment: Dysphagia  Patient Details Name: Brian Martin MRN: [id number] DOB: [birth year] Today's Date: 09/01/2021 Time: 1215-1224 SLP Time Calculation (min) (ACUTE ONLY): 9 min  Assessment / Plan / Recommendation Clinical Impression  Pt was seen for f/u after evaluation on previous date. He was finishing his lunch meal, eating partially reclined in bed. SLP offered to elevate his [facility] for safety, but pt declined, saying that he often eats in bed like this. Educated pt that this could potentially impact safety with PO intake, but he declines repositioning still. Pt has no overt s/s of aspiration and does not believe that he needs further SLP intervention. His wife at bedside also denies any difficulties PTA. Suspect that pt could be at risk for aspiration if not utilizing aspiration and esophageal precautions, but that oropharyngeal swallow does appear to be grossly functional. Will sign off acutely but please reorder with any concerns.    HPI HPI: Pt is a 66 y/o M presenting from skilled nursing facility s/p fall with infection from R foot ulcer and cognitive decline. CT chest showed esophagitis and bronchitis with food residue suspicious for aspiration. Per MD note, suspect uncontrolled GERD. PMH includes L BKA, CHF, PAF, IDDM, and HTN.      SLP Plan  All goals met      Recommendations for follow up therapy are one component of a multi-disciplinary discharge planning process, led by the attending physician.  Recommendations may be updated based on patient status, additional functional criteria and insurance authorization.    Recommendations  Diet recommendations: Regular;Thin liquid Liquids provided via: Cup;Straw Medication Administration: Whole meds with puree Supervision: Patient able to self feed;Intermittent supervision to cue for compensatory strategies Compensations: Slow rate;Small sips/bites;Follow solids with liquid Postural Changes and/or Swallow  Maneuvers: Seated upright 90 degrees;Upright 30-60 min after meal                Oral Care Recommendations: Oral care BID Follow Up Recommendations: Skilled nursing-short term rehab (<3 hours/day) Assistance recommended at discharge: Set up Supervision/Assistance SLP Visit Diagnosis: Dysphagia, unspecified (R13.10) Plan: All goals met           Osie Bond., M.A. [facility] Pager [phone] Office [phone]  09/01/2021, 12:26 PM

## 2021-09-01 NOTE — H&P (View-Only) (Signed)
°  Progress Note    09/01/2021 7:32 AM * No surgery date entered *  Subjective:  No complaints or questions this morning   Vitals:   08/31/21 2131 09/01/21 0727  BP: 132/71 137/78  Pulse: 81 73  Resp: 16 17  Temp: 98 F (36.7 C) 97.6 F (36.4 C)  SpO2: 97% 100%   Physical Exam: Lungs:  non labored Extremities:  ankle, toe ulcers R LE stable compared to picture on 1/3 Neurologic: A&O  CBC    Component Value Date/Time   WBC 11.2 (H) 09/01/2021 0332   RBC 3.14 (L) 09/01/2021 0332   HGB 9.7 (L) 09/01/2021 0332   HCT 30.2 (L) 09/01/2021 0332   PLT 215 09/01/2021 0332   MCV 96.2 09/01/2021 0332   MCH 30.9 09/01/2021 0332   MCHC 32.1 09/01/2021 0332   RDW 13.1 09/01/2021 0332   LYMPHSABS 1.5 08/30/2021 1540   MONOABS 1.3 (H) 08/30/2021 1540   EOSABS 0.2 08/30/2021 1540   BASOSABS 0.1 08/30/2021 1540    BMET    Component Value Date/Time   NA 137 09/01/2021 0332   K 4.0 09/01/2021 0332   CL 107 09/01/2021 0332   CO2 22 09/01/2021 0332   GLUCOSE 180 (H) 09/01/2021 0332   BUN 27 (H) 09/01/2021 0332   CREATININE 1.36 (H) 09/01/2021 0332   CALCIUM 8.2 (L) 09/01/2021 0332   GFRNONAA 57 (L) 09/01/2021 0332   GFRAA >60 11/21/2017 1001    INR    Component Value Date/Time   INR 1.3 (H) 04/08/2021 0327   INR 2.7 10/31/2010 0921     Intake/Output Summary (Last 24 hours) at 09/01/2021 0732 Last data filed at 08/31/2021 1500 Gross per 24 hour  Intake 712.76 ml  Output --  Net 712.76 ml     Assessment/Plan:  66 y.o. male with R foot/ankle ulcers  Continue daily dressing changes to R foot Eliquis is being held in preparation for RLE arteriogram tomorrow 1/6 Patient and wife are aware he is at risk for limb loss Consent ordered Npo past midnight   Dagoberto Ligas, PA-C [facility] [phone] 09/01/2021 7:32 AM

## 2021-09-01 NOTE — Progress Notes (Signed)
°   09/01/21 1110  Mobility  Activity Refused mobility (Patient declined will check back)

## 2021-09-02 ENCOUNTER — Encounter (HOSPITAL_COMMUNITY)
Admission: EM | Disposition: A | Discharge status: Skilled Nursing Facility | Payer: Self-pay | Source: Skilled Nursing Facility | Attending: Internal Medicine

## 2021-09-02 ENCOUNTER — Encounter (HOSPITAL_COMMUNITY): Payer: Self-pay | Admitting: Vascular Surgery

## 2021-09-02 DIAGNOSIS — E1369 Other specified diabetes mellitus with other specified complication: Secondary | ICD-10-CM | POA: Diagnosis not present

## 2021-09-02 DIAGNOSIS — A419 Sepsis, unspecified organism: Secondary | ICD-10-CM | POA: Diagnosis not present

## 2021-09-02 DIAGNOSIS — Z89512 Acquired absence of left leg below knee: Secondary | ICD-10-CM

## 2021-09-02 DIAGNOSIS — I70235 Atherosclerosis of native arteries of right leg with ulceration of other part of foot: Secondary | ICD-10-CM

## 2021-09-02 HISTORY — PX: LOWER EXTREMITY ANGIOGRAPHY: CATH118251

## 2021-09-02 HISTORY — PX: ABDOMINAL AORTOGRAM: CATH118222

## 2021-09-02 HISTORY — PX: PERIPHERAL VASCULAR BALLOON ANGIOPLASTY: CATH118281

## 2021-09-02 LAB — GLUCOSE, CAPILLARY
Glucose-Capillary: 163 mg/dL — ABNORMAL HIGH (ref 70–99)
Glucose-Capillary: 154 mg/dL — ABNORMAL HIGH (ref 70–99)
Glucose-Capillary: 102 mg/dL — ABNORMAL HIGH (ref 70–99)

## 2021-09-02 LAB — POCT ACTIVATED CLOTTING TIME
Activated Clotting Time: 245 seconds
Activated Clotting Time: 227 seconds
Activated Clotting Time: 185 seconds

## 2021-09-02 SURGERY — LOWER EXTREMITY ANGIOGRAPHY
Anesthesia: LOCAL | Laterality: Right

## 2021-09-02 MED ORDER — MIDAZOLAM HCL 2 MG/2ML IJ SOLN
INTRAMUSCULAR | Status: DC | PRN
  Administered 2021-09-02: 1 mg via INTRAVENOUS

## 2021-09-02 MED ORDER — LIDOCAINE HCL (PF) 1 % IJ SOLN
INTRAMUSCULAR | Status: DC | PRN
  Administered 2021-09-02: 18 mL

## 2021-09-02 MED ORDER — ONDANSETRON HCL 4 MG/2ML IJ SOLN: 4.0000 mg | Freq: Four times a day (QID) | INTRAMUSCULAR | Status: DC | PRN

## 2021-09-02 MED ORDER — LABETALOL HCL 5 MG/ML IV SOLN
INTRAVENOUS | Status: AC
  Filled 2021-09-02: qty 4

## 2021-09-02 MED ORDER — METRONIDAZOLE 500 MG/100ML IV SOLN
500.0000 mg | Freq: Two times a day (BID) | INTRAVENOUS | Status: DC
  Administered 2021-09-02 – 2021-09-09 (×14): 500 mg via INTRAVENOUS
  Filled 2021-09-02 (×14): qty 100

## 2021-09-02 MED ORDER — SODIUM CHLORIDE 0.9 % WEIGHT BASED INFUSION
1.0000 mL/kg/h | INTRAVENOUS | Status: AC
  Administered 2021-09-02: 1 mL/kg/h via INTRAVENOUS

## 2021-09-02 MED ORDER — CHLORHEXIDINE GLUCONATE CLOTH 2 % EX PADS
6.0000 | MEDICATED_PAD | Freq: Every day | CUTANEOUS | Status: DC
  Administered 2021-09-03: 6 via TOPICAL

## 2021-09-02 MED ORDER — LABETALOL HCL 5 MG/ML IV SOLN
10.0000 mg | INTRAVENOUS | Status: DC | PRN
  Administered 2021-09-02 (×3): 10 mg via INTRAVENOUS
  Filled 2021-09-02 (×2): qty 4

## 2021-09-02 MED ORDER — HEPARIN SODIUM (PORCINE) 1000 UNIT/ML IJ SOLN
INTRAMUSCULAR | Status: AC
  Filled 2021-09-02: qty 10

## 2021-09-02 MED ORDER — SODIUM CHLORIDE 0.9% FLUSH
3.0000 mL | Freq: Two times a day (BID) | INTRAVENOUS | Status: DC
  Administered 2021-09-02 – 2021-09-06 (×5): 3 mL via INTRAVENOUS

## 2021-09-02 MED ORDER — ACETAMINOPHEN 325 MG PO TABS: 650.0000 mg | ORAL_TABLET | ORAL | Status: DC | PRN

## 2021-09-02 MED ORDER — SODIUM CHLORIDE 0.9% FLUSH: 3.0000 mL | INTRAVENOUS | Status: DC | PRN

## 2021-09-02 MED ORDER — SODIUM CHLORIDE 0.9 % IV SOLN
2.0000 g | Freq: Three times a day (TID) | INTRAVENOUS | Status: DC
  Administered 2021-09-02 – 2021-09-09 (×21): 2 g via INTRAVENOUS
  Filled 2021-09-02 (×20): qty 2

## 2021-09-02 MED ORDER — FENTANYL CITRATE (PF) 100 MCG/2ML IJ SOLN
INTRAMUSCULAR | Status: AC
  Filled 2021-09-02: qty 2

## 2021-09-02 MED ORDER — HYDRALAZINE HCL 20 MG/ML IJ SOLN: 5.0000 mg | INTRAMUSCULAR | Status: DC | PRN

## 2021-09-02 MED ORDER — FENTANYL CITRATE (PF) 100 MCG/2ML IJ SOLN
INTRAMUSCULAR | Status: DC | PRN
  Administered 2021-09-02: 50 ug via INTRAVENOUS

## 2021-09-02 MED ORDER — IODIXANOL 320 MG/ML IV SOLN
INTRAVENOUS | Status: DC | PRN
  Administered 2021-09-02: 170 mL

## 2021-09-02 MED ORDER — LIDOCAINE HCL (PF) 1 % IJ SOLN
INTRAMUSCULAR | Status: AC
  Filled 2021-09-02: qty 30

## 2021-09-02 MED ORDER — SODIUM CHLORIDE 0.9 % IV SOLN: 250.0000 mL | INTRAVENOUS | Status: DC | PRN

## 2021-09-02 MED ORDER — MIDAZOLAM HCL 2 MG/2ML IJ SOLN
INTRAMUSCULAR | Status: AC
  Filled 2021-09-02: qty 2

## 2021-09-02 MED ORDER — HEPARIN SODIUM (PORCINE) 1000 UNIT/ML IJ SOLN
INTRAMUSCULAR | Status: DC | PRN
  Administered 2021-09-02: 10000 [IU] via INTRAVENOUS

## 2021-09-02 MED ORDER — MUPIROCIN 2 % EX OINT
1.0000 "application " | TOPICAL_OINTMENT | Freq: Two times a day (BID) | CUTANEOUS | Status: AC
  Administered 2021-09-02 – 2021-09-07 (×10): 1 via NASAL
  Filled 2021-09-02 (×2): qty 22

## 2021-09-02 MED ORDER — HEPARIN (PORCINE) IN NACL 1000-0.9 UT/500ML-% IV SOLN
INTRAVENOUS | Status: DC | PRN
  Administered 2021-09-02 (×2): 500 mL

## 2021-09-02 MED ORDER — HEPARIN (PORCINE) IN NACL 1000-0.9 UT/500ML-% IV SOLN
INTRAVENOUS | Status: AC
  Filled 2021-09-02: qty 1000

## 2021-09-02 SURGICAL SUPPLY — 15 items
CATH ANGIO 5F PIGTAIL 65CM (CATHETERS) ×1 IMPLANT
CATH CROSS OVER TEMPO 5F (CATHETERS) ×1 IMPLANT
CATH QUICKCROSS SUPP .035X90CM (MICROCATHETER) ×1 IMPLANT
CATH STRAIGHT 5FR 65CM (CATHETERS) ×1 IMPLANT
DEVICE TORQUE .025-.038 (MISCELLANEOUS) ×1 IMPLANT
GLIDEWIRE ADV .035X260CM (WIRE) ×1 IMPLANT
KIT MICROPUNCTURE NIT STIFF (SHEATH) ×1 IMPLANT
KIT PV (KITS) ×3 IMPLANT
SHEATH PINNACLE 5F 10CM (SHEATH) ×1 IMPLANT
SHEATH PINNACLE MP 6F 45CM (SHEATH) ×1 IMPLANT
SHEATH PROBE COVER 6X72 (BAG) ×1 IMPLANT
SYR MEDRAD MARK V 150ML (SYRINGE) ×1 IMPLANT
TRANSDUCER W/STOPCOCK (MISCELLANEOUS) ×3 IMPLANT
TRAY PV CATH (CUSTOM PROCEDURE TRAY) ×3 IMPLANT
WIRE HITORQ VERSACORE ST 145CM (WIRE) ×1 IMPLANT

## 2021-09-02 NOTE — Interval H&P Note (Signed)
History and Physical Interval Note:  09/02/2021 9:15 AM  Brian Martin  has presented today for surgery, with the diagnosis of wound.  The various methods of treatment have been discussed with the patient and family. After consideration of risks, benefits and other options for treatment, the patient has consented to  Procedure(s): LOWER EXTREMITY ANGIOGRAPHY (N/A) as a surgical intervention.  The patient's history has been reviewed, patient examined, no change in status, stable for surgery.  I have reviewed the patient's chart and labs.  Questions were answered to the patient's satisfaction.     [name]

## 2021-09-02 NOTE — Care Management Important Message (Signed)
Important Message  Patient Details  Name: Brian Martin MRN: [id number] Date of Birth: 03-09-1956   Medicare Important Message Given:  Yes     Hannah Beat 09/02/2021, 11:54 AM

## 2021-09-02 NOTE — Progress Notes (Signed)
Patient admitted to [facility] room 15. VSS. Heart monitor applied and CCMD notified. CHG bath completed. L groin site level zero. Patient oriented to room with call bell and phone in reach. Will continue to monitor Edwena Blow, RN

## 2021-09-02 NOTE — Progress Notes (Signed)
Site area: left groin Site Prior to Removal:  Level 0 Pressure Applied For: 20 minutes Manual:   yes Patient Status During Pull:  stable Post Pull Site:  Level 0 Post Pull Instructions Given:  yes Post Pull Pulses Present: left BKA Dressing Applied:  gauze and tegaderm Bedrest begins @ 8563 Comments:

## 2021-09-02 NOTE — Op Note (Signed)
PATIENT: Brian Martin      MRN: [id number] DOB: 1956/05/01    DATE OF PROCEDURE: 09/02/2021  INDICATIONS:    Brian Martin is a 66 y.o. male with critical limb ischemia of the right lower extremity.  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  He has a left below the knee amputation.  PROCEDURE:    Conscious sedation Ultrasound-guided access to the left common femoral artery Aortogram with bilateral iliac arteriogram Selective catheterization of the right external iliac artery with right lower extremity runoff Selective catheterization of the right superficial femoral artery third order catheterization with attempted PTA of the right superficial femoral artery  SURGEON: Judeth Cornfield. Scot Dock, MD, FACS  ANESTHESIA: Local with sedation  EBL: Minimal  TECHNIQUE: The patient was brought to the peripheral vascular lab and was sedated. The period of conscious sedation was 92 minutes.  During that time period, I was present face-to-face 100% of the time.  The patient was administered 1 mg of Versed and 50 mcg of fentanyl. The patient's heart rate, blood pressure, and oxygen saturation were monitored by the nurse continuously during the procedure.  Both groins were prepped and draped in the usual sterile fashion.  Under ultrasound guidance, after the skin was anesthetized, I cannulated the left common femoral artery with a micropuncture needle and a micropuncture sheath was introduced over a wire.  This was exchanged for a 5 [place] sheath over a Bentson wire.  By ultrasound the femoral artery was patent. A real-time image was obtained and sent to the server.  The pigtail catheter was positioned at the L1 vertebral body and flush aortogram obtained.  The catheter was in position above the aortic bifurcation and an oblique iliac projection was obtained.  I then exchanged the pigtail catheter for a crossover catheter which was  positioned into the right common iliac artery.  I advanced the wire down into the common femoral artery and the crossover catheter was exchanged for a straight catheter.  Selective right external iliac arteriogram was obtained with right lower extremity runoff.  There was occlusion of the right superficial femoral artery that I felt could potentially be addressed with angioplasty and stenting.  I exchanged the straight catheter for a destination sheath (6 [place]) over a Glidewire advantage wire.  I then used a quick cross catheter and the Glidewire advantage to get into the proximal superficial femoral artery.  I was able to establish a dissection plane but ultimately was unable to reenter distally.  I was concerned that I was going to create a dissection further distally that would then limit his options for open surgery.  As it stood now he was a candidate for a right femoral to above-knee popliteal artery bypass and I did not want a burn that bridge.  I therefore removed the wire and repeated the arteriogram which showed that the above-knee popliteal artery was still patent leaving the option for a femoral to above-knee popliteal artery bypass.  The sheath was then retracted and positioned in the common femoral artery on the left.  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  The patient was transferred to the holding area for removal of the sheath.  No immediate complications were noted.  FINDINGS:   There are single renal arteries bilaterally with no significant renal artery stenosis  identified. The infrarenal aorta is patent. On the right side, which is the side of concern, the common iliac and external iliac arteries are patent.  There is calcific disease in the iliac system.  The hypogastric artery is patent. The right superficial femoral artery was occluded at its origin.  The common femoral and  deep femoral artery were patent.  There was reconstitution of the above-knee popliteal artery on the right with two-vessel runoff via the anterior tibial and peroneal arteries.  The posterior tibial artery is occluded. I was unable to perform successful angioplasty and stenting of the right superficial femoral artery as I could not get back into the normal system distally and was concerned that I was creating a more distal dissection. On the left side, the common iliac artery is patent.  The left external iliac artery appears to have a short segment occlusion.  CLINICAL NOTE: I will review the films with Dr. Trula Slade.  He may potentially be a candidate for a right femoral to above-knee popliteal artery bypass.    [name], MD, FACS [facility]  DATE OF DICTATION:   09/02/2021

## 2021-09-02 NOTE — Progress Notes (Signed)
Pt refused zosyn and any other meds he may get after MN, He requested to meds starting @ 0800 because he needed to get some rest.

## 2021-09-02 NOTE — Progress Notes (Signed)
I have reviewed the patient's arteriogram from earlier today.  He had an unsuccessful attempt at percutaneous revascularization.  At this point, we will need to determine whether or not he is a candidate for femoral-popliteal bypass graft.  I would recommend getting cardiology involved for cardiac clearance.  I would start him on heparin instead of resuming his Eliquis.  I will also get vein mapping.  Brian Martin

## 2021-09-02 NOTE — Progress Notes (Signed)
PROGRESS NOTE  Brian Martin QQP:[id number] DOB: 1956/06/01 DOA: 08/30/2021 PCP: Marco Collie, MD   LOS: 3 days   Brief Narrative / Interim history: Brian Martin is a 66 y.o. male with medical history significant of nursing home resident for cognition declining, PVD status post left BKA, PAF on Eliquis, chronic diastolic CHF, IDDM, HTN, presented with frequent falls.  He also was complaining of right foot ulcer that was developed from November, completed a course of antibiotics but also remained persistent.  There is also reports about his cognition recently declining and was sent to the assisted living facility by family.  Subjective / 24h Interval events: Sitting at the bedside commode, no significant complaints.  Awaiting his procedure  Assessment & Plan: Principal Problem Right foot ulcer-there was initial concern for osteomyelitis, however MRI done on admission was negative for that.  ABIs obtained showed progression of his peripheral vascular disease on the right lower extremity.  Vascular surgery consulted, plan for arteriogram this morning.  Hold Eliquis.  Remains n.p.o. -Continue antibiotics  Active Problems Peripheral vascular disease-status post left AKA.  Right-sided ABI as above, arteriogram today per vascular surgery  Hypokalemia-repleted, potassium has normalized this morning  Leukocytosis-due to foot ulcer infection-improving, continue antibiotics  Chronic kidney disease stage IIIa-Baseline creatinine 1.2-1.5, currently at baseline  Essential hypertension-continue Coreg  Hyperlipidemia-continue statin  Depression-continue Zoloft  CKD 2-creatinine at baseline  Paroxysmal A. fib-in sinus, continue Eliquis, Coreg  Chronic diastolic CHF-euvolemic, continue Coreg, furosemide  Type 2 diabetes mellitus, with hyperglycemia-A1c 7.0.  Continue long-acting and mealtime insulin  CBG (last 3)  Recent Labs    09/01/21 1120 09/01/21 1635 09/01/21 2248  GLUCAP 199*  250* 191*     Scheduled Meds:  [MAR Hold] benzonatate  200 mg Oral TID   [MAR Hold] carvedilol  25 mg Oral BID WC   [MAR Hold] docusate sodium  100 mg Oral BID   [MAR Hold] furosemide  20 mg Oral Daily   [MAR Hold] guaiFENesin  1,200 mg Oral BID   [MAR Hold] insulin aspart  0-15 Units Subcutaneous TID WC   [MAR Hold] insulin aspart  3 Units Subcutaneous TID WC   [MAR Hold] insulin glargine-yfgn  25 Units Subcutaneous q morning   [MAR Hold] lisinopril  10 mg Oral Daily   [MAR Hold] mometasone-formoterol  2 puff Inhalation BID   [MAR Hold] pantoprazole  40 mg Oral BID AC   [MAR Hold] sertraline  25 mg Oral Daily   [MAR Hold] simvastatin  10 mg Oral Daily   Continuous Infusions:  [MAR Hold] piperacillin-tazobactam (ZOSYN)  IV 3.375 g (09/01/21 1728)   [MAR Hold] vancomycin 1,750 mg (09/01/21 1203)   PRN Meds:.[MAR Hold] acetaminophen **OR** [MAR Hold] acetaminophen, [MAR Hold] albuterol, fentaNYL, Heparin (Porcine) in NaCl, heparin sodium (porcine), iodixanol, lidocaine (PF), [MAR Hold] LORazepam, midazolam, [MAR Hold] ondansetron **OR** [MAR Hold] ondansetron (ZOFRAN) IV  Diet Orders (From admission, onward)     Start     Ordered   09/02/21 0001  Diet NPO time specified  Diet effective midnight        09/01/21 0735            DVT prophylaxis:      Code Status: Full Code  Family Communication: Wife at bedside  Status is: Inpatient  Remains inpatient appropriate because: Pending vascular intervention   Level of care: Med-Surg  Consultants:  none  Procedures:  none  Microbiology  Negative so far  Antimicrobials: Vancomycin / Zosyn  Objective: Vitals:   09/02/21 1011 09/02/21 1016 09/02/21 1020 09/02/21 1025  BP: (!) 166/82 (!) 171/82 (!) 168/80 (!) 173/81  Pulse: 73 70 73 75  Resp: 17 (!) 0 10 (!) 8  Temp:      TempSrc:      SpO2: 100% 100% 100% 100%  Weight:      Height:        Intake/Output Summary (Last 24 hours) at 09/02/2021 1045 Last data  filed at 09/02/2021 8657 Gross per 24 hour  Intake 373.78 ml  Output 1900 ml  Net -1526.22 ml    Filed Weights   08/30/21 2121  Weight: 98.1 kg    Examination:  Constitutional: No distress Eyes: No scleral icterus ENMT: Moist mucous membranes Neck: normal, supple Respiratory: Clear bilaterally, no wheezing or crackles, normal respiratory effort Cardiovascular: Regular rate and rhythm, no murmurs, no edema Abdomen: Soft, nontender, nondistended, positive bowel sounds Musculoskeletal: no clubbing / cyanosis.  Skin: No rashes seen Neurologic: Nonfocal, equal strength  Data Reviewed: I have independently reviewed following labs and imaging studies   CBC: Recent Labs  Lab 08/30/21 1540 08/31/21 0313 09/01/21 0332  WBC 16.1* 13.8* 11.2*  NEUTROABS 13.0*  --   --   HGB 10.8* 9.8* 9.7*  HCT 34.3* 30.4* 30.2*  MCV 98.0 95.6 96.2  PLT 241 196 846    Basic Metabolic Panel: Recent Labs  Lab 08/30/21 1213 08/31/21 0313 09/01/21 0332  NA 139 141 137  K 4.0 3.2* 4.0  CL 106 112* 107  CO2 23 23 22   GLUCOSE 252* 106* 180*  BUN 31* 28* 27*  CREATININE 1.44* 1.23 1.36*  CALCIUM 8.5* 8.1* 8.2*    Liver Function Tests: Recent Labs  Lab 08/30/21 1213  AST 44*  ALT 18  ALKPHOS 58  BILITOT 0.9  PROT 6.5  ALBUMIN 3.2*    Coagulation Profile: No results for input(s): INR, PROTIME in the last 168 hours. HbA1C: Recent Labs    08/30/21 1911  HGBA1C 7.0*    CBG: Recent Labs  Lab 08/31/21 1633 09/01/21 0728 09/01/21 1120 09/01/21 1635 09/01/21 2248  GLUCAP 141* 177* 199* 250* 191*     Recent Results (from the past 240 hour(s))  Resp Panel by RT-PCR (Flu A&B, Covid) Nasopharyngeal Swab     Status: None   Collection Time: 08/30/21 11:47 AM   Specimen: Nasopharyngeal Swab; Nasopharyngeal(NP) swabs in vial transport medium  Result Value Ref Range Status   SARS Coronavirus 2 by RT PCR NEGATIVE NEGATIVE Final    Comment: (NOTE) SARS-CoV-2 target nucleic acids  are NOT DETECTED.  The SARS-CoV-2 RNA is generally detectable in upper respiratory specimens during the acute phase of infection. The lowest concentration of SARS-CoV-2 viral copies this assay can detect is 138 copies/mL. A negative result does not preclude SARS-Cov-2 infection and should not be used as the sole basis for treatment or other patient management decisions. A negative result may occur with  improper specimen collection/handling, submission of specimen other than nasopharyngeal swab, presence of viral mutation(s) within the areas targeted by this assay, and inadequate number of viral copies(<138 copies/mL). A negative result must be combined with clinical observations, patient history, and epidemiological information. The expected result is Negative.  Fact Sheet for Patients:  EntrepreneurPulse.com.au  Fact Sheet for Healthcare Providers:  IncredibleEmployment.be  This test is no t yet approved or cleared by the [place] FDA and  has been authorized for detection and/or diagnosis of SARS-CoV-2 by FDA under an Emergency Use  Authorization (EUA). This EUA will remain  in effect (meaning this test can be used) for the duration of the COVID-19 declaration under Section 564(b)(1) of the Act, 21 U.S.C.section 360bbb-3(b)(1), unless the authorization is terminated  or revoked sooner.       Influenza A by PCR NEGATIVE NEGATIVE Final   Influenza B by PCR NEGATIVE NEGATIVE Final    Comment: (NOTE) The Xpert Xpress SARS-CoV-2/FLU/RSV plus assay is intended as an aid in the diagnosis of influenza from Nasopharyngeal swab specimens and should not be used as a sole basis for treatment. Nasal washings and aspirates are unacceptable for Xpert Xpress SARS-CoV-2/FLU/RSV testing.  Fact Sheet for Patients: EntrepreneurPulse.com.au  Fact Sheet for Healthcare Providers: IncredibleEmployment.be  This test is  not yet approved or cleared by the [place] FDA and has been authorized for detection and/or diagnosis of SARS-CoV-2 by FDA under an Emergency Use Authorization (EUA). This EUA will remain in effect (meaning this test can be used) for the duration of the COVID-19 declaration under Section 564(b)(1) of the Act, 21 U.S.C. section 360bbb-3(b)(1), unless the authorization is terminated or revoked.  Performed at [facility], [place] [address], [place], [place] [address]   Blood culture (routine x 2)     Status: None (Preliminary result)   Collection Time: 08/30/21  4:34 PM   Specimen: BLOOD  Result Value Ref Range Status   Specimen Description BLOOD RIGHT ANTECUBITAL  Final   Special Requests   Final    BOTTLES DRAWN AEROBIC AND ANAEROBIC Blood Culture adequate volume   Culture   Final    NO GROWTH 3 DAYS Performed at [facility], [place] [address] [place], [place] [address]    Report Status PENDING  Incomplete  Blood culture (routine x 2)     Status: None (Preliminary result)   Collection Time: 08/30/21  4:36 PM   Specimen: BLOOD  Result Value Ref Range Status   Specimen Description BLOOD LEFT ANTECUBITAL  Final   Special Requests   Final    BOTTLES DRAWN AEROBIC AND ANAEROBIC Blood Culture adequate volume   Culture   Final    NO GROWTH 3 DAYS Performed at [facility], [place] [address], [place], [place] [address]    Report Status PENDING  Incomplete  MRSA Next Gen by PCR, Nasal     Status: Abnormal   Collection Time: 08/31/21  6:01 AM   Specimen: Nasal Mucosa; Nasal Swab  Result Value Ref Range Status   MRSA by PCR Next Gen DETECTED (A) NOT DETECTED Final    Comment: RESULT CALLED TO, READ BACK BY AND VERIFIED WITH: Earl Lites RN, AT [phone] 08/31/21 D. VANHOOK (NOTE) The GeneXpert MRSA Assay (FDA approved for NASAL specimens only), is one component of a comprehensive MRSA colonization surveillance program. It is not intended to diagnose MRSA infection nor to  guide or monitor treatment for MRSA infections. Test performance is not FDA approved in patients less than 74 years old. Performed at [facility], [place] [address] [place], [place] [address]       Radiology Studies: PERIPHERAL VASCULAR CATHETERIZATION  Result Date: 09/02/2021 Table formatting from the original result was not included. INDICATIONS:  Brian Martin is a 66 y.o. male with critical limb ischemia of the right lower extremity.  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  He has a left below the knee amputation. PROCEDURE:  Conscious  sedation Ultrasound-guided access to the left common femoral artery Aortogram with bilateral iliac arteriogram Selective catheterization of the right external iliac artery with right lower extremity runoff Selective catheterization of the right superficial femoral artery third order catheterization with attempted PTA of the right superficial femoral artery SURGEON: Judeth Cornfield. Scot Dock, MD, FACS ANESTHESIA: Local with sedation EBL: Minimal TECHNIQUE: The patient was brought to the peripheral vascular lab and was sedated. The period of conscious sedation was 92 minutes.  During that time period, I was present face-to-face 100% of the time.  The patient was administered 1 mg of Versed and 50 mcg of fentanyl. The patient's heart rate, blood pressure, and oxygen saturation were monitored by the nurse continuously during the procedure. Both groins were prepped and draped in the usual sterile fashion.  Under ultrasound guidance, after the skin was anesthetized, I cannulated the left common femoral artery with a micropuncture needle and a micropuncture sheath was introduced over a wire.  This was exchanged for a 5 [place] sheath over a Bentson wire.  By ultrasound the femoral artery was patent. A real-time image was obtained and sent to the server. The pigtail catheter was positioned at the L1  vertebral body and flush aortogram obtained.  The catheter was in position above the aortic bifurcation and an oblique iliac projection was obtained.  I then exchanged the pigtail catheter for a crossover catheter which was positioned into the right common iliac artery.  I advanced the wire down into the common femoral artery and the crossover catheter was exchanged for a straight catheter.  Selective right external iliac arteriogram was obtained with right lower extremity runoff.  There was occlusion of the right superficial femoral artery that I felt could potentially be addressed with angioplasty and stenting.  I exchanged the straight catheter for a destination sheath (6 [place]) over a Glidewire advantage wire.  I then used a quick cross catheter and the Glidewire advantage to get into the proximal superficial femoral artery.  I was able to establish a dissection plane but ultimately was unable to reenter distally.  I was concerned that I was going to create a dissection further distally that would then limit his options for open surgery.  As it stood now he was a candidate for a right femoral to above-knee popliteal artery bypass and I did not want a burn that bridge.  I therefore removed the wire and repeated the arteriogram which showed that the above-knee popliteal artery was still patent leaving the option for a femoral to above-knee popliteal artery bypass.  The sheath was then retracted and positioned in the common femoral artery on the left.  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  The patient was transferred to the holding area for removal of the sheath.  No immediate complications were noted. FINDINGS: There are single renal arteries bilaterally with no significant renal artery stenosis identified. The infrarenal aorta is patent. On the right side, which is the side of concern, the common iliac  and external iliac arteries are patent.  There is calcific disease in the iliac system.  The hypogastric artery is patent. The right superficial femoral artery was occluded at its origin.  The common femoral and deep femoral artery were patent.  There was reconstitution of the above-knee popliteal artery on the right with two-vessel runoff via the anterior tibial and peroneal arteries.  The posterior  tibial artery is occluded. I was unable to perform successful angioplasty and stenting of the right superficial femoral artery as I could not get back into the normal system distally and was concerned that I was creating a more distal dissection. On the left side, the common iliac artery is patent.  The left external iliac artery appears to have a short segment occlusion. CLINICAL NOTE: I will review the films with Dr. Trula Slade.  He may potentially be a candidate for a right femoral to above-knee popliteal artery bypass. [name], MD, FACS [facility]     Marzetta Board, MD, PhD [facility]  Between 7 am - 7 pm I am available, please contact me via Amion (for emergencies) or Securechat (non urgent messages)  Between 7 pm - 7 am I am not available, please contact night coverage MD/APP via Amion

## 2021-09-02 NOTE — Progress Notes (Signed)
PT Cancellation Note  Patient Details Name: Brian Martin MRN: [id number] DOB: 03-17-1956   Cancelled Treatment:    Reason Eval/Treat Not Completed: Patient not medically ready Pt on bedrest until 1738 s/p cath.  Will f/u later date. Abran Richard, PT Acute Rehab Services Pager [phone] [facility] [place] 09/02/2021, 3:50 PM

## 2021-09-03 ENCOUNTER — Inpatient Hospital Stay (HOSPITAL_COMMUNITY): Payer: Medicare Other

## 2021-09-03 DIAGNOSIS — I2510 Atherosclerotic heart disease of native coronary artery without angina pectoris: Secondary | ICD-10-CM | POA: Diagnosis not present

## 2021-09-03 DIAGNOSIS — A419 Sepsis, unspecified organism: Secondary | ICD-10-CM | POA: Diagnosis not present

## 2021-09-03 DIAGNOSIS — M8660 Other chronic osteomyelitis, unspecified site: Secondary | ICD-10-CM | POA: Diagnosis not present

## 2021-09-03 DIAGNOSIS — Z01810 Encounter for preprocedural cardiovascular examination: Secondary | ICD-10-CM

## 2021-09-03 DIAGNOSIS — I5032 Chronic diastolic (congestive) heart failure: Secondary | ICD-10-CM

## 2021-09-03 DIAGNOSIS — E1369 Other specified diabetes mellitus with other specified complication: Secondary | ICD-10-CM | POA: Diagnosis not present

## 2021-09-03 DIAGNOSIS — E785 Hyperlipidemia, unspecified: Secondary | ICD-10-CM

## 2021-09-03 LAB — CBC
HCT: 29.7 % — ABNORMAL LOW (ref 39.0–52.0)
Hemoglobin: 9.5 g/dL — ABNORMAL LOW (ref 13.0–17.0)
MCH: 30.8 pg (ref 26.0–34.0)
MCHC: 32 g/dL (ref 30.0–36.0)
MCV: 96.4 fL (ref 80.0–100.0)
Platelets: 243 10*3/uL (ref 150–400)
RBC: 3.08 MIL/uL — ABNORMAL LOW (ref 4.22–5.81)
RDW: 12.7 % (ref 11.5–15.5)
WBC: 11.8 10*3/uL — ABNORMAL HIGH (ref 4.0–10.5)
nRBC: 0 % (ref 0.0–0.2)

## 2021-09-03 LAB — ECHOCARDIOGRAM COMPLETE
AR max vel: 4.21 cm2
AV Area VTI: 4.13 cm2
AV Area mean vel: 4.13 cm2
AV Mean grad: 2 mmHg
AV Peak grad: 4.5 mmHg
Ao pk vel: 1.06 m/s
Height: 73 in
S' Lateral: 3.7 cm
Weight: 3460.34 oz

## 2021-09-03 LAB — COMPREHENSIVE METABOLIC PANEL
ALT: 17 U/L (ref 0–44)
AST: 20 U/L (ref 15–41)
Albumin: 2.3 g/dL — ABNORMAL LOW (ref 3.5–5.0)
Alkaline Phosphatase: 43 U/L (ref 38–126)
Anion gap: 4 — ABNORMAL LOW (ref 5–15)
BUN: 21 mg/dL (ref 8–23)
CO2: 22 mmol/L (ref 22–32)
Calcium: 8.2 mg/dL — ABNORMAL LOW (ref 8.9–10.3)
Chloride: 106 mmol/L (ref 98–111)
Creatinine, Ser: 1.1 mg/dL (ref 0.61–1.24)
GFR, Estimated: >60 mL/min (ref >60)
Glucose, Bld: 173 mg/dL — ABNORMAL HIGH (ref 70–99)
Potassium: 3.6 mmol/L (ref 3.5–5.1)
Sodium: 132 mmol/L — ABNORMAL LOW (ref 135–145)
Total Bilirubin: 0.9 mg/dL (ref 0.3–1.2)
Total Protein: 5.7 g/dL — ABNORMAL LOW (ref 6.5–8.1)

## 2021-09-03 LAB — GLUCOSE, CAPILLARY
Glucose-Capillary: 135 mg/dL — ABNORMAL HIGH (ref 70–99)
Glucose-Capillary: 209 mg/dL — ABNORMAL HIGH (ref 70–99)
Glucose-Capillary: 129 mg/dL — ABNORMAL HIGH (ref 70–99)
Glucose-Capillary: 129 mg/dL — ABNORMAL HIGH (ref 70–99)

## 2021-09-03 MED ORDER — ASPIRIN 81 MG PO CHEW
81.0000 mg | CHEWABLE_TABLET | ORAL | Status: AC
  Administered 2021-09-05: 81 mg via ORAL
  Filled 2021-09-03: qty 1

## 2021-09-03 MED ORDER — SODIUM CHLORIDE 0.9 % IV SOLN: 250.0000 mL | INTRAVENOUS | Status: DC | PRN

## 2021-09-03 MED ORDER — ASPIRIN EC 81 MG PO TBEC
81.0000 mg | DELAYED_RELEASE_TABLET | Freq: Every day | ORAL | Status: DC
  Administered 2021-09-03 – 2021-09-09 (×5): 81 mg via ORAL
  Filled 2021-09-03 (×5): qty 1

## 2021-09-03 MED ORDER — SODIUM CHLORIDE 0.9% FLUSH: 3.0000 mL | INTRAVENOUS | Status: DC | PRN

## 2021-09-03 MED ORDER — ROSUVASTATIN CALCIUM 20 MG PO TABS
20.0000 mg | ORAL_TABLET | Freq: Every day | ORAL | Status: DC
  Administered 2021-09-03 – 2021-09-09 (×6): 20 mg via ORAL
  Filled 2021-09-03 (×6): qty 1

## 2021-09-03 MED ORDER — SODIUM CHLORIDE 0.9% FLUSH: 3.0000 mL | Freq: Two times a day (BID) | INTRAVENOUS | Status: DC

## 2021-09-03 MED ORDER — SODIUM CHLORIDE 0.9 % WEIGHT BASED INFUSION
1.0000 mL/kg/h | INTRAVENOUS | Status: DC
  Administered 2021-09-04 – 2021-09-05 (×2): 1 mL/kg/h via INTRAVENOUS

## 2021-09-03 NOTE — Progress Notes (Signed)
PROGRESS NOTE  [name] XFG:[id number] DOB: November 21, 1955 DOA: 08/30/2021 PCP: Marco Collie, MD   LOS: 4 days   Brief Narrative / Interim history: Brian Martin is a 66 y.o. male with medical history significant of nursing home resident for cognition declining, PVD status post left BKA, PAF on Eliquis, chronic diastolic CHF, IDDM, HTN, presented with frequent falls.  He also was complaining of right foot ulcer that was developed from November, completed a course of antibiotics but also remained persistent.  There is also reports about his cognition recently declining and was sent to the assisted living facility by family.  Subjective / 24h Interval events: In bed, no complaints.  Assessment & Plan: Principal Problem Right foot ulcer-there was initial concern for osteomyelitis, however MRI done on admission was negative for that.  ABIs obtained showed progression of his peripheral vascular disease on the right lower extremity.  Vascular surgery consulted, he underwent arteriogram 1/6 without endovascular revascularization options based on angiography, he is being considered for bypass surgery next week.  Active Problems Peripheral vascular disease-status post left AKA.  Right-sided ABI as above, as above, potential bypass surgery next week.  CAD -CT scan with coronary artery calcification.  Given high risk vascular surgery, cardiology consulted for preop clearance.  They plan to do cardiac catheterization on Monday.  Hypokalemia-continue to monitor and replete as necessary  Leukocytosis-due to foot ulcer infection-improving, continue antibiotics  Chronic kidney disease stage IIIa-Baseline creatinine 1.2-1.5, currently at baseline currently at baseline  Essential hypertension-continue Coreg  Hyperlipidemia-continue statin  Depression-continue Zoloft  CKD 2-creatinine at baseline  Paroxysmal A. fib-in sinus, continue Coreg, Eliquis on hold pending potential surgery next  week  Chronic combined systolic and diastolic CHF-euvolemic, continue Coreg, furosemide  Type 2 diabetes mellitus, with hyperglycemia-A1c 7.0.  Continue long-acting and mealtime insulin.  CBGs with good control as below  CBG (last 3)  Recent Labs    09/02/21 2108 09/03/21 0611 09/03/21 1213  GLUCAP 102* 135* 209*     Scheduled Meds:  aspirin EC  81 mg Oral Daily   benzonatate  200 mg Oral TID   carvedilol  25 mg Oral BID WC   Chlorhexidine Gluconate Cloth  6 each Topical Q0600   docusate sodium  100 mg Oral BID   furosemide  20 mg Oral Daily   guaiFENesin  1,200 mg Oral BID   insulin aspart  0-15 Units Subcutaneous TID WC   insulin aspart  3 Units Subcutaneous TID WC   insulin glargine-yfgn  25 Units Subcutaneous q morning   mometasone-formoterol  2 puff Inhalation BID   mupirocin ointment  1 application Nasal BID   pantoprazole  40 mg Oral BID AC   rosuvastatin  20 mg Oral Daily   sertraline  25 mg Oral Daily   sodium chloride flush  3 mL Intravenous Q12H   Continuous Infusions:  sodium chloride     ceFEPime (MAXIPIME) IV 2 g (09/03/21 1219)   metronidazole 500 mg (09/03/21 1317)   vancomycin 1,750 mg (09/03/21 0827)   PRN Meds:.sodium chloride, acetaminophen, albuterol, hydrALAZINE, labetalol, LORazepam, ondansetron (ZOFRAN) IV, ondansetron **OR** [DISCONTINUED] ondansetron (ZOFRAN) IV, sodium chloride flush  Diet Orders (From admission, onward)     Start     Ordered   09/02/21 1339  Diet Carb Modified Fluid consistency: Thin; Room service appropriate? Yes  Diet effective now       Question Answer Comment  Diet-HS Snack? Nothing   Calorie Level Medium 1600-2000   Fluid consistency:  Thin   Room service appropriate? Yes      09/02/21 1338            DVT prophylaxis: SCD's Start: 09/02/21 1339     Code Status: Full Code  Family Communication: Wife at bedside  Status is: Inpatient  Remains inpatient appropriate because: Pending vascular  intervention   Level of care: Med-Surg  Consultants:  none  Procedures:  none  Microbiology  Negative so far  Antimicrobials: Vancomycin / Zosyn    Objective: Vitals:   09/02/21 2317 09/03/21 0317 09/03/21 1234 09/03/21 1310  BP: (!) 117/57 137/70 (!) 98/51 135/78  Pulse: 93 79 (!) 110 85  Resp: 20 19 20 19   Temp:  98.9 F (37.2 C) 98.1 F (36.7 C)   TempSrc:  Oral Oral   SpO2: 98% 95% 96%   Weight:      Height:        Intake/Output Summary (Last 24 hours) at 09/03/2021 1431 Last data filed at 09/03/2021 1318 Gross per 24 hour  Intake 1512.47 ml  Output 1750 ml  Net -237.53 ml    Filed Weights   08/30/21 2121  Weight: 98.1 kg    Examination:  Constitutional: NAD Eyes: Anicteric ENMT: Moist mucous membranes Neck: normal, supple Respiratory: Lungs are clear bilaterally without wheezing or crackles Cardiovascular: Regular rate and rhythm, no murmurs Abdomen: Soft, NT, ND, positive bowel sounds Musculoskeletal: no clubbing / cyanosis.  Skin: No rashes seen Neurologic: No focal deficits  Data Reviewed: I have independently reviewed following labs and imaging studies   CBC: Recent Labs  Lab 08/30/21 1540 08/31/21 0313 09/01/21 0332 09/03/21 0158  WBC 16.1* 13.8* 11.2* 11.8*  NEUTROABS 13.0*  --   --   --   HGB 10.8* 9.8* 9.7* 9.5*  HCT 34.3* 30.4* 30.2* 29.7*  MCV 98.0 95.6 96.2 96.4  PLT 241 196 215 106    Basic Metabolic Panel: Recent Labs  Lab 08/30/21 1213 08/31/21 0313 09/01/21 0332 09/03/21 0158  NA 139 141 137 132*  K 4.0 3.2* 4.0 3.6  CL 106 112* 107 106  CO2 23 23 22 22   GLUCOSE 252* 106* 180* 173*  BUN 31* 28* 27* 21  CREATININE 1.44* 1.23 1.36* 1.10  CALCIUM 8.5* 8.1* 8.2* 8.2*    Liver Function Tests: Recent Labs  Lab 08/30/21 1213 09/03/21 0158  AST 44* 20  ALT 18 17  ALKPHOS 58 43  BILITOT 0.9 0.9  PROT 6.5 5.7*  ALBUMIN 3.2* 2.3*    Coagulation Profile: No results for input(s): INR, PROTIME in the last 168  hours. HbA1C: No results for input(s): HGBA1C in the last 72 hours.  CBG: Recent Labs  Lab 09/02/21 1105 09/02/21 1717 09/02/21 2108 09/03/21 0611 09/03/21 1213  GLUCAP 163* 154* 102* 135* 209*     Recent Results (from the past 240 hour(s))  Resp Panel by RT-PCR (Flu A&B, Covid) Nasopharyngeal Swab     Status: None   Collection Time: 08/30/21 11:47 AM   Specimen: Nasopharyngeal Swab; Nasopharyngeal(NP) swabs in vial transport medium  Result Value Ref Range Status   SARS Coronavirus 2 by RT PCR NEGATIVE NEGATIVE Final    Comment: (NOTE) SARS-CoV-2 target nucleic acids are NOT DETECTED.  The SARS-CoV-2 RNA is generally detectable in upper respiratory specimens during the acute phase of infection. The lowest concentration of SARS-CoV-2 viral copies this assay can detect is 138 copies/mL. A negative result does not preclude SARS-Cov-2 infection and should not be used as the sole basis  for treatment or other patient management decisions. A negative result may occur with  improper specimen collection/handling, submission of specimen other than nasopharyngeal swab, presence of viral mutation(s) within the areas targeted by this assay, and inadequate number of viral copies(<138 copies/mL). A negative result must be combined with clinical observations, patient history, and epidemiological information. The expected result is Negative.  Fact Sheet for Patients:  EntrepreneurPulse.com.au  Fact Sheet for Healthcare Providers:  IncredibleEmployment.be  This test is no t yet approved or cleared by the [place] FDA and  has been authorized for detection and/or diagnosis of SARS-CoV-2 by FDA under an Emergency Use Authorization (EUA). This EUA will remain  in effect (meaning this test can be used) for the duration of the COVID-19 declaration under Section 564(b)(1) of the Act, 21 U.S.C.section 360bbb-3(b)(1), unless the authorization is  terminated  or revoked sooner.       Influenza A by PCR NEGATIVE NEGATIVE Final   Influenza B by PCR NEGATIVE NEGATIVE Final    Comment: (NOTE) The Xpert Xpress SARS-CoV-2/FLU/RSV plus assay is intended as an aid in the diagnosis of influenza from Nasopharyngeal swab specimens and should not be used as a sole basis for treatment. Nasal washings and aspirates are unacceptable for Xpert Xpress SARS-CoV-2/FLU/RSV testing.  Fact Sheet for Patients: EntrepreneurPulse.com.au  Fact Sheet for Healthcare Providers: IncredibleEmployment.be  This test is not yet approved or cleared by the [place] FDA and has been authorized for detection and/or diagnosis of SARS-CoV-2 by FDA under an Emergency Use Authorization (EUA). This EUA will remain in effect (meaning this test can be used) for the duration of the COVID-19 declaration under Section 564(b)(1) of the Act, 21 U.S.C. section 360bbb-3(b)(1), unless the authorization is terminated or revoked.  Performed at [facility], [place] [address], [place], [place] [address]   Blood culture (routine x 2)     Status: None (Preliminary result)   Collection Time: 08/30/21  4:34 PM   Specimen: BLOOD  Result Value Ref Range Status   Specimen Description BLOOD RIGHT ANTECUBITAL  Final   Special Requests   Final    BOTTLES DRAWN AEROBIC AND ANAEROBIC Blood Culture adequate volume   Culture   Final    NO GROWTH 4 DAYS Performed at [facility], [place] [address] [place], [place] [address]    Report Status PENDING  Incomplete  Blood culture (routine x 2)     Status: None (Preliminary result)   Collection Time: 08/30/21  4:36 PM   Specimen: BLOOD  Result Value Ref Range Status   Specimen Description BLOOD LEFT ANTECUBITAL  Final   Special Requests   Final    BOTTLES DRAWN AEROBIC AND ANAEROBIC Blood Culture adequate volume   Culture   Final    NO GROWTH 4 DAYS Performed at [facility],  [place] [address] [place], [place] [address]    Report Status PENDING  Incomplete  MRSA Next Gen by PCR, Nasal     Status: Abnormal   Collection Time: 08/31/21  6:01 AM   Specimen: Nasal Mucosa; Nasal Swab  Result Value Ref Range Status   MRSA by PCR Next Gen DETECTED (A) NOT DETECTED Final    Comment: RESULT CALLED TO, READ BACK BY AND VERIFIED WITH: Earl Lites RN, AT [phone] 08/31/21 D. VANHOOK (NOTE) The GeneXpert MRSA Assay (FDA approved for NASAL specimens only), is one component of a comprehensive MRSA colonization surveillance program. It is not intended to diagnose MRSA infection nor  to guide or monitor treatment for MRSA infections. Test performance is not FDA approved in patients less than 37 years old. Performed at [facility], [place] [address], [place], [place] [address]       Radiology Studies: No results found.   Marzetta Board, MD, PhD [facility]  Between 7 am - 7 pm I am available, please contact me via Amion (for emergencies) or Securechat (non urgent messages)  Between 7 pm - 7 am I am not available, please contact night coverage MD/APP via Amion

## 2021-09-03 NOTE — Progress Notes (Signed)
°  Echocardiogram 2D Echocardiogram has been performed.  Brian Martin 09/03/2021, 4:23 PM

## 2021-09-03 NOTE — Progress Notes (Addendum)
°  Progress Note    09/03/2021 9:52 AM 1 Day Post-Op  Subjective:  No new complaints   Vitals:   09/02/21 2317 09/03/21 0317  BP: (!) 117/57 137/70  Pulse: 93 79  Resp: 20 19  Temp:  98.9 F (37.2 C)  SpO2: 98% 95%   Physical Exam: Lungs:  non labored Incisions:  L groin cath site without hematoma Extremities:  R foot with dressings in place no purulence or ascending rash Neurologic: A&O  CBC    Component Value Date/Time   WBC 11.8 (H) 09/03/2021 0158   RBC 3.08 (L) 09/03/2021 0158   HGB 9.5 (L) 09/03/2021 0158   HCT 29.7 (L) 09/03/2021 0158   PLT 243 09/03/2021 0158   MCV 96.4 09/03/2021 0158   MCH 30.8 09/03/2021 0158   MCHC 32.0 09/03/2021 0158   RDW 12.7 09/03/2021 0158   LYMPHSABS 1.5 08/30/2021 1540   MONOABS 1.3 (H) 08/30/2021 1540   EOSABS 0.2 08/30/2021 1540   BASOSABS 0.1 08/30/2021 1540    BMET    Component Value Date/Time   NA 132 (L) 09/03/2021 0158   K 3.6 09/03/2021 0158   CL 106 09/03/2021 0158   CO2 22 09/03/2021 0158   GLUCOSE 173 (H) 09/03/2021 0158   BUN 21 09/03/2021 0158   CREATININE 1.10 09/03/2021 0158   CALCIUM 8.2 (L) 09/03/2021 0158   GFRNONAA >60 09/03/2021 0158   GFRAA >60 11/21/2017 1001    INR    Component Value Date/Time   INR 1.3 (H) 04/08/2021 0327   INR 2.7 10/31/2010 0921     Intake/Output Summary (Last 24 hours) at 09/03/2021 0952 Last data filed at 09/03/2021 0845 Gross per 24 hour  Intake 1437.47 ml  Output 800 ml  Net 637.47 ml     Assessment/Plan:  66 y.o. male is s/p RLE arteriogram 1 Day Post-Op   L groin cath site unremarkable No endovascular revascularization options based on angiography He will be considered for bypass surgery next week.  He will need cardiology risk stratification and R leg vein mapping this weekend  Dagoberto Ligas, [facility] [phone] 09/03/2021 9:52 AM  VASCULAR STAFF ADDENDUM: I have independently interviewed and examined the patient. I agree  with the above.  Will follow up vein mapping and cardiology recs Femoral to above-knee bypass next week pending clearance  Cassandria Santee, MD [facility] Phone Number: [phone] 09/03/2021 10:38 AM

## 2021-09-03 NOTE — Progress Notes (Signed)
VASCULAR LAB    Right lower extremity vein mapping has been performed.  See CV proc for preliminary results.   Kamarie Palma, RVT 09/03/2021, 5:25 PM

## 2021-09-03 NOTE — Consult Note (Signed)
Cardiology Consultation:   Patient ID: Brian Martin MRN: [id number]; DOB: March 01, 1956  Admit date: 08/30/2021 Date of Consult: 09/03/2021  PCP:  Marco Collie, MD   [facility] Providers Cardiologist:  None        Patient Profile:   Brian Martin is a 66 y.o. male with a hx of chronic systolic and diastolic heart failure, PAF, DM2, HTN, CKD stage III, and left BKA who is being seen 09/03/2021 for the evaluation of preoperative risk assessment at the request of Dr. Renne Crigler.  History of Present Illness:   [name] has a history of reduced LVEF to 25-30%, dilated cardiomyopathy, moderate MR in 2011. Nuclear stress test in 2012 suspicious for soft tissue attenuation, but could not exclude prior infarct, no ischemia. Repeat echo 12/2010 with normalized EF. Heart cath was not pursued at that time due to renal insufficiency and absence of anginal symptoms. He has atrial fibrillation treated with amiodarone that was discontinued in 2012 as he was maintaining sinus rhythm. He was last seen in the advanced heart failure clinic 12/02/18 and was doing well at that time. He was still anticoagulated with coumadin. GDMT included coreg, lisinopril, 20 mg lasix, and zocor. In 2021, coumadin was switched to eliquis 5 mg BID.    Overall he does not complain of any overt chest discomfort or shortness of breath or syncope or arrhythmias.  He cannot however complete greater than 4 METS of activity.    LM and LAD as well as pericardial calcification.     RCA  Past Medical History:  Diagnosis Date   Amputation of leg ([place])    Atrial fibrillation (HCC)    CHF (congestive heart failure) ([place])    chronic systolic CHF   Chronic renal failure    Diabetes mellitus    type 2   Hypertension     Past Surgical History:  Procedure Laterality Date   ABDOMINAL AORTOGRAM N/A 09/02/2021   Procedure: ABDOMINAL AORTOGRAM;  Surgeon: Angelia Mould, MD;  Location: [place] [facility];  Service:  Cardiovascular;  Laterality: N/A;   BELOW KNEE LEG AMPUTATION  06/29/2010   Left    COLONOSCOPY N/A 06/15/2013   Procedure: COLONOSCOPY;  Surgeon: Lear Ng, MD;  Location: [place];  Service: Endoscopy;  Laterality: N/A;   ESOPHAGOGASTRODUODENOSCOPY N/A 06/14/2013   Procedure: ESOPHAGOGASTRODUODENOSCOPY (EGD);  Surgeon: Lear Ng, MD;  Location: [facility];  Service: Endoscopy;  Laterality: N/A;   LOWER EXTREMITY ANGIOGRAPHY Right 09/02/2021   Procedure: LOWER EXTREMITY ANGIOGRAPHY;  Surgeon: Angelia Mould, MD;  Location: [place] [facility];  Service: Cardiovascular;  Laterality: Right;   PERIPHERAL VASCULAR BALLOON ANGIOPLASTY Right 09/02/2021   Procedure: PERIPHERAL VASCULAR BALLOON ANGIOPLASTY;  Surgeon: Angelia Mould, MD;  Location: [place] [facility];  Service: Cardiovascular;  Laterality: Right;  SFA    UNABLE TO CROSS OCCLUDED LEAION     Home Medications:  Prior to Admission medications   Medication Sig Start Date End Date Taking? Authorizing Provider  albuterol (PROVENTIL) (2.5 MG/3ML) 0.083% nebulizer solution Take 2.5 mg by nebulization every 6 (six) hours as needed for wheezing or shortness of breath.   Yes [provider]  apixaban (ELIQUIS) 5 MG TABS tablet Take 1 tablet (5 mg total) by mouth 2 (two) times daily. NEEDS APPOINTMENT FOR FUTURE REFILLS 10/25/20  Yes Martin, Brian Pascal, MD  budesonide-formoterol [facility]) 160-4.5 MCG/ACT inhaler Inhale 2 puffs into the lungs 2 (two) times daily.   Yes [provider]  carvedilol (COREG) 25 MG tablet Take 25 mg by mouth 2 (two) times daily with a meal.   Yes [provider]  docusate sodium (COLACE) 100 MG capsule Take 1 capsule (100 mg total) by mouth 2 (two) times daily. 02/21/16  Yes Brian Cellar, MD  furosemide (LASIX) 20 MG tablet Take 1 tablet (20 mg total) by mouth daily. 07/03/14  Yes Brian Brunt, CRNA  HUMALOG KWIKPEN 100 UNIT/ML KwikPen Inject 3 Units  into the skin 3 (three) times daily. 08/25/21  Yes [provider]  insulin glargine (LANTUS SOLOSTAR) 100 UNIT/ML Solostar Pen Inject 25 Units into the skin every morning.   Yes [provider]  lisinopril (PRINIVIL,ZESTRIL) 10 MG tablet Take 10 mg by mouth daily.   Yes [provider]  pantoprazole (PROTONIX) 40 MG tablet Take 1 tablet (40 mg total) by mouth daily. 06/16/13  Yes Brian Costa, MD  sertraline (ZOLOFT) 25 MG tablet Take 25 mg by mouth daily.   Yes [provider]  simvastatin (ZOCOR) 10 MG tablet Take 10 mg by mouth daily.   Yes [provider]  vitamin C (ASCORBIC ACID) 250 MG tablet Take 250 mg by mouth daily.   Yes [provider]  insulin glargine (LANTUS) 100 UNIT/ML injection Inject 0.25 mLs (25 Units total) into the skin at bedtime. Patient not taking: Reported on 08/30/2021 04/13/21   Brian Lipps, MD    Inpatient Medications: Scheduled Meds:  benzonatate  200 mg Oral TID   carvedilol  25 mg Oral BID WC   Chlorhexidine Gluconate Cloth  6 each Topical Q0600   docusate sodium  100 mg Oral BID   furosemide  20 mg Oral Daily   guaiFENesin  1,200 mg Oral BID   insulin aspart  0-15 Units Subcutaneous TID WC   insulin aspart  3 Units Subcutaneous TID WC   insulin glargine-yfgn  25 Units Subcutaneous q morning   lisinopril  10 mg Oral Daily   mometasone-formoterol  2 puff Inhalation BID   mupirocin ointment  1 application Nasal BID   pantoprazole  40 mg Oral BID AC   sertraline  25 mg Oral Daily   simvastatin  10 mg Oral Daily   sodium chloride flush  3 mL Intravenous Q12H   Continuous Infusions:  sodium chloride     ceFEPime (MAXIPIME) IV 2 g (09/03/21 0548)   metronidazole Stopped (09/03/21 0423)   vancomycin 1,750 mg (09/03/21 0827)   PRN Meds: sodium chloride, acetaminophen, albuterol, hydrALAZINE, labetalol, LORazepam, ondansetron (ZOFRAN) IV, ondansetron **OR** [DISCONTINUED] ondansetron (ZOFRAN) IV, sodium  chloride flush  Allergies:   No Known Allergies  Social History:   Social History   Socioeconomic History   Marital status: Married    Spouse name: Not on file   Number of children: Not on file   Years of education: Not on file   Highest education level: Not on file  Occupational History   Not on file  Tobacco Use   Smoking status: Former    Types: Cigarettes    Quit date: 08/28/2001    Years since quitting: 20.0   Smokeless tobacco: Never  Substance and Sexual Activity   Alcohol use: No   Drug use: Yes    Types: Marijuana    Comment: no marijuana in the past month   Sexual activity: Not on file  Other Topics Concern   Not on file  Social History Narrative   Not on file   Social Determinants of Health  Financial Resource Strain: Not on file  Food Insecurity: Not on file  Transportation Needs: Not on file  Physical Activity: Not on file  Stress: Not on file  Social Connections: Not on file  Intimate Partner Violence: Not on file    Family History:    Family History  Problem Relation Age of Onset   Diabetes Other      ROS:  Please see the history of present illness.   All other ROS reviewed and negative.     Physical Exam/Data:   Vitals:   09/02/21 1700 09/02/21 1900 09/02/21 2317 09/03/21 0317  BP: 136/71 (!) 137/58 (!) 117/57 137/70  Pulse: 78 82 93 79  Resp:  19 20 19   Temp:  98.1 F (36.7 C)  98.9 F (37.2 C)  TempSrc:  Oral  Oral  SpO2:  99% 98% 95%  Weight:      Height:        Intake/Output Summary (Last 24 hours) at 09/03/2021 1209 Last data filed at 09/03/2021 0845 Gross per 24 hour  Intake 1437.47 ml  Output 800 ml  Net 637.47 ml   Last 3 Weights 08/30/2021 12/02/2018 11/21/2017  Weight (lbs) 216 lb 4.3 oz 225 lb 231 lb  Weight (kg) 98.1 kg 102.059 kg 104.781 kg     Body mass index is 28.53 kg/m.  General:  Well nourished, well developed, in no acute distress HEENT: normal Neck: no JVD Vascular: No carotid bruits; Distal pulses 2+  bilaterally Cardiac:  normal S1, S2; RRR; no murmur  Lungs:  clear to auscultation bilaterally, no wheezing, rhonchi or rales  Abd: soft, nontender, no hepatomegaly  Ext: no edema Musculoskeletal: Left BKA Skin: warm and dry  Neuro:  CNs 2-12 intact, no focal abnormalities noted Psych:  Normal affect   EKG:  The EKG was personally reviewed and demonstrates: Sinus rhythm left axis deviation   Telemetry:  Telemetry was personally reviewed and demonstrates: No adverse arrhythmias, sinus rhythm, PVC  Relevant CV Studies:  ECHO 2016:  - Normal LV size with mild LV hypertrophy. EF 55%. Normal RV size    and systolic function. No significant valvular abnormalities  Laboratory Data:  High Sensitivity Troponin:   Recent Labs  Lab 08/30/21 1213 08/30/21 1440  TROPONINIHS 23* 22*     Chemistry Recent Labs  Lab 08/31/21 0313 09/01/21 0332 09/03/21 0158  NA 141 137 132*  K 3.2* 4.0 3.6  CL 112* 107 106  CO2 23 22 22   GLUCOSE 106* 180* 173*  BUN 28* 27* 21  CREATININE 1.23 1.36* 1.10  CALCIUM 8.1* 8.2* 8.2*  GFRNONAA >60 57* >60  ANIONGAP 6 8 4*    Recent Labs  Lab 08/30/21 1213 09/03/21 0158  PROT 6.5 5.7*  ALBUMIN 3.2* 2.3*  AST 44* 20  ALT 18 17  ALKPHOS 58 43  BILITOT 0.9 0.9   Lipids No results for input(s): CHOL, TRIG, HDL, LABVLDL, LDLCALC, CHOLHDL in the last 168 hours.  Hematology Recent Labs  Lab 08/31/21 0313 09/01/21 0332 09/03/21 0158  WBC 13.8* 11.2* 11.8*  RBC 3.18* 3.14* 3.08*  HGB 9.8* 9.7* 9.5*  HCT 30.4* 30.2* 29.7*  MCV 95.6 96.2 96.4  MCH 30.8 30.9 30.8  MCHC 32.2 32.1 32.0  RDW 13.0 13.1 12.7  PLT 196 215 243   Thyroid  Recent Labs  Lab 08/30/21 1213  TSH 1.194    BNP Recent Labs  Lab 08/30/21 1213  BNP 117.2*    DDimer No results for input(s): DDIMER  in the last 168 hours.   Radiology/Studies:  CT Chest Wo Contrast  Result Date: 08/30/2021 CLINICAL DATA:  Cough and sepsis.  Normal chest x-ray. EXAM: CT CHEST WITHOUT  CONTRAST TECHNIQUE: Multidetector CT imaging of the chest was performed following the standard protocol without IV contrast. COMPARISON:  Radiograph earlier today. FINDINGS: Cardiovascular: The heart is normal in size. Incomplete pericardial calcifications posteroinferior to the left atrium is well as anterior to the right atrium. Mitral annulus and coronary artery calcifications. Aortic atherosclerosis without aortic aneurysm. No periaortic stranding. Mediastinum/Nodes: Diffuse esophageal wall thickening with occasional intraluminal debris. No paraesophageal stranding. Multiple small mediastinal lymph nodes are not enlarged by size criteria. Limited assessment for hilar adenopathy on this unenhanced exam. Possible 2.1 cm exophytic nodule from the posterior aspect of the right thyroid gland, series 3, image 19, difficult to accurately assess. Alternatively this may represent an enlarged parathyroid gland or lymph node. Lungs/Pleura: Moderate retained debris within the dependent trachea. Mild retained debris within the bilateral mainstem bronchi. Diffuse central bronchial thickening, most prominently affecting the right greater than left lower lobes. Occasional areas of mucous plugging/bronchial occlusion in the segmental right lower lobe. There are small nodular opacities in the dependent right lower lobe, in the region of bronchial occlusion/mucous plugging, series 4 images 101-103. Additional mild patchy nodularity in the left lower lobe series 4, image 107. Subsegmental opacities within the dependent lower lobes may be postobstructive atelectasis. Mild emphysema. Scattered tiny peripheral calcified granuloma. No dominant pulmonary mass. No pleural fluid. No findings of pulmonary edema. Upper Abdomen: Layering gallstones in the gallbladder. Small subcapsular cyst in the dome of the liver. Fatty atrophy of the pancreas. Symmetric perinephric stranding about the included kidneys. Musculoskeletal: Advanced  glenohumeral osteoarthritis on the right. Remote left anterior rib fractures. No acute osseous findings. IMPRESSION: 1. Diffuse esophageal wall thickening with occasional intraluminal debris, suspicious for esophagitis. 2. Layering debris within the trachea and mainstem bronchi. Central bronchial thickening with subsegmental mucous plugging in the right lower lobe. Small nodular opacities in the dependent right lower lobe and to a lesser extent left lower lobe, in the region of bronchial occlusion/mucous plugging. In the setting of sepsis and cough, findings likely represent infectious bronchiolitis. The possibility of aspiration is raised. 3. Mild emphysema. 4. Possible 2.1 cm exophytic nodule from the posterior aspect of the right thyroid gland. Alternatively this may represent an enlarged parathyroid gland or lymph node. Recommend thyroid US (ref: J Am Coll Radiol. 2015 Feb;12(2): 143-50). 5. Aortic atherosclerosis and coronary artery calcifications. 6. Pericardial calcifications, can be seen in the setting of prior pericarditis or constrictive cardiomyopathy. Clinical correlation is recommended, consider further assessment with echocardiogram as clinically indicated. Aortic Atherosclerosis (ICD10-I70.0) and Emphysema (ICD10-J43.9). Electronically Signed   By: Keith Rake M.D.   On: 08/30/2021 17:43   MR FOOT RIGHT WO CONTRAST  Result Date: 08/31/2021 CLINICAL DATA:  Osteomyelitis, foot EXAM: MRI OF THE RIGHT FOREFOOT WITHOUT CONTRAST TECHNIQUE: Multiplanar, multisequence MR imaging of the right forefoot was performed. No intravenous contrast was administered. COMPARISON:  None. FINDINGS: Bones/Joint/Cartilage Extensive motion artifact which limits evaluation. There is artifactually increased signal along the lateral forefoot at the fourth and fifth toes and webspace, as evidence by no corresponding signal on STIR. Ligaments Poorly visualized due to motion artifact. Muscles and Tendons Diffuse atrophy in  the foot as is commonly seen in diabetics. No visible tendon tear. Soft tissues There is forefoot soft tissue swelling. No drainable fluid collection. IMPRESSION: Motion degraded exam. No evidence of  osteomyelitis or drainable soft tissue abscess. Electronically Signed   By: Maurine Simmering M.D.   On: 08/31/2021 08:25   PERIPHERAL VASCULAR CATHETERIZATION  Result Date: 09/02/2021 Table formatting from the original result was not included. INDICATIONS:  CHERRY WITTWER is a 66 y.o. male with critical limb ischemia of the right lower extremity.  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  He has a left below the knee amputation. PROCEDURE:  Conscious sedation Ultrasound-guided access to the left common femoral artery Aortogram with bilateral iliac arteriogram Selective catheterization of the right external iliac artery with right lower extremity runoff Selective catheterization of the right superficial femoral artery third order catheterization with attempted PTA of the right superficial femoral artery SURGEON: Judeth Cornfield. Scot Dock, MD, FACS ANESTHESIA: Local with sedation EBL: Minimal TECHNIQUE: The patient was brought to the peripheral vascular lab and was sedated. The period of conscious sedation was 92 minutes.  During that time period, I was present face-to-face 100% of the time.  The patient was administered 1 mg of Versed and 50 mcg of fentanyl. The patient's heart rate, blood pressure, and oxygen saturation were monitored by the nurse continuously during the procedure. Both groins were prepped and draped in the usual sterile fashion.  Under ultrasound guidance, after the skin was anesthetized, I cannulated the left common femoral artery with a micropuncture needle and a micropuncture sheath was introduced over a wire.  This was exchanged for a 5 [place] sheath over a Bentson wire.  By ultrasound the femoral artery was patent. A real-time image  was obtained and sent to the server. The pigtail catheter was positioned at the L1 vertebral body and flush aortogram obtained.  The catheter was in position above the aortic bifurcation and an oblique iliac projection was obtained.  I then exchanged the pigtail catheter for a crossover catheter which was positioned into the right common iliac artery.  I advanced the wire down into the common femoral artery and the crossover catheter was exchanged for a straight catheter.  Selective right external iliac arteriogram was obtained with right lower extremity runoff.  There was occlusion of the right superficial femoral artery that I felt could potentially be addressed with angioplasty and stenting.  I exchanged the straight catheter for a destination sheath (6 [place]) over a Glidewire advantage wire.  I then used a quick cross catheter and the Glidewire advantage to get into the proximal superficial femoral artery.  I was able to establish a dissection plane but ultimately was unable to reenter distally.  I was concerned that I was going to create a dissection further distally that would then limit his options for open surgery.  As it stood now he was a candidate for a right femoral to above-knee popliteal artery bypass and I did not want a burn that bridge.  I therefore removed the wire and repeated the arteriogram which showed that the above-knee popliteal artery was still patent leaving the option for a femoral to above-knee popliteal artery bypass.  The sheath was then retracted and positioned in the common femoral artery on the left.  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  The patient was transferred to the holding area for removal of the sheath.  No immediate complications were noted. FINDINGS: There are single renal arteries bilaterally with no significant renal  artery stenosis identified. The infrarenal  aorta is patent. On the right side, which is the side of concern, the common iliac and external iliac arteries are patent.  There is calcific disease in the iliac system.  The hypogastric artery is patent. The right superficial femoral artery was occluded at its origin.  The common femoral and deep femoral artery were patent.  There was reconstitution of the above-knee popliteal artery on the right with two-vessel runoff via the anterior tibial and peroneal arteries.  The posterior tibial artery is occluded. I was unable to perform successful angioplasty and stenting of the right superficial femoral artery as I could not get back into the normal system distally and was concerned that I was creating a more distal dissection. On the left side, the common iliac artery is patent.  The left external iliac artery appears to have a short segment occlusion. CLINICAL NOTE: I will review the films with Dr. Trula Slade.  He may potentially be a candidate for a right femoral to above-knee popliteal artery bypass. [name], MD, FACS [facility]   VAS [place] ABI WITH/WO TBI  Result Date: 08/31/2021  LOWER EXTREMITY DOPPLER STUDY Patient Name:  Brian Martin  Date of Exam:   08/31/2021 Medical Rec #: [id number]        Accession #:    [id number] Date of Birth: [birth year]        Patient Gender: M Patient Age:   3 years Exam Location:  [facility] Procedure:      VAS [place] ABI WITH/WO TBI Referring Phys: Wynetta Fines --------------------------------------------------------------------------------  High Risk Factors: Hypertension, Diabetes.  Vascular Interventions: LT AKA. Limitations: Today's exam was limited due to PVD w/ intermittent claudication. Comparison Study: 02/20/16 prior Performing Technologist: Archie Patten RVS  Examination Guidelines: A complete evaluation includes at minimum, Doppler waveform signals and systolic blood pressure reading at the level of bilateral brachial,  anterior tibial, and posterior tibial arteries, when vessel segments are accessible. Bilateral testing is considered an integral part of a complete examination. Photoelectric Plethysmograph (PPG) waveforms and toe systolic pressure readings are included as required and additional duplex testing as needed. Limited examinations for reoccurring indications may be performed as noted.  ABI Findings: +---------+------------------+-----+-------------------+-----------------------+  Right     Rt Pressure (mmHg) Index Waveform            Comment                  +---------+------------------+-----+-------------------+-----------------------+  Brachial  155                      triphasic                                    +---------+------------------+-----+-------------------+-----------------------+  PTA       84                 0.54  monophasic                                   +---------+------------------+-----+-------------------+-----------------------+  DP        72                 0.46  dampened monophasic                          +---------+------------------+-----+-------------------+-----------------------+  Great Toe                                              unable to obtain                                                                 pressure due to                                                                  bandaging                +---------+------------------+-----+-------------------+-----------------------+ +--------+------------------+-----+---------+-------+  Left     Lt Pressure (mmHg) Index Waveform  Comment  +--------+------------------+-----+---------+-------+  Brachial 152                      triphasic          +--------+------------------+-----+---------+-------+  Summary: Right: Resting right ankle-brachial index indicates moderate right lower extremity arterial disease. Left: AKA.  *See table(s) above for measurements and observations.  Electronically signed by Harold Barban MD on  08/31/2021 at 11:29:57 PM.    Final      Assessment and Plan:   66 year old with severe peripheral arterial disease awaiting right lower extremity arterial bypass, prior left BKA with recent CT of chest demonstrating significant coronary artery calcification including left main, LAD, RCA as well as pericardial calcification.  We are seeing him for preoperative restratification.  Preoperative cardiac risk stratification - Given the significant coronary artery calcifications as well as high risk vascular surgery upcoming, we will proceed with left and right heart catheterization on Monday to hopefully eliminate severe proximal coronary artery disease.  Most recent creatinine is 1.1.  Risks and benefits including stroke heart attack death renal impairment bleeding have been discussed.  His wife was present.  They are willing to proceed.  He is unable to complete greater than 4 METS of activity.  He does utilize a prosthesis on his left lower extremity.  We are trying to save his right leg as well.  Pericardial calcification - No prior radiation history.  Seen on CT scan.  May be beneficial to perform right heart catheterization to ensure that there is no hemodynamic evidence of constriction.  Coronary artery calcifications - Left main, RCA, LAD calcifications noted on chest CT most recently performed.  Personally reviewed. -We will start aspirin 81 mg a day.  Hyperlipidemia - I will change his simvastatin 10 mg over to rosuvastatin 20 mg, high intensity statin.  Mildly elevated troponin - Flat, mild demand ischemia in the setting of his underlying vascular disease.  Not acute coronary syndrome.  Chronic kidney disease stage IIIa - Most recent creatinine 1.1, baseline around 1.3.  We will hydrate starting at 8 PM the night before angiogram.  We will continue to follow.   For questions or updates, please contact [facility] Please consult [email or web address] for contact info under  Signed, Ledora Bottcher, [place]  09/03/2021 12:09 PM

## 2021-09-04 DIAGNOSIS — M8660 Other chronic osteomyelitis, unspecified site: Secondary | ICD-10-CM | POA: Diagnosis not present

## 2021-09-04 DIAGNOSIS — A419 Sepsis, unspecified organism: Secondary | ICD-10-CM | POA: Diagnosis not present

## 2021-09-04 DIAGNOSIS — E1369 Other specified diabetes mellitus with other specified complication: Secondary | ICD-10-CM | POA: Diagnosis not present

## 2021-09-04 LAB — BASIC METABOLIC PANEL
Anion gap: 5 (ref 5–15)
BUN: 20 mg/dL (ref 8–23)
CO2: 21 mmol/L — ABNORMAL LOW (ref 22–32)
Calcium: 8.1 mg/dL — ABNORMAL LOW (ref 8.9–10.3)
Chloride: 109 mmol/L (ref 98–111)
Creatinine, Ser: 1.31 mg/dL — ABNORMAL HIGH (ref 0.61–1.24)
GFR, Estimated: >60 mL/min (ref >60)
Glucose, Bld: 134 mg/dL — ABNORMAL HIGH (ref 70–99)
Potassium: 3.4 mmol/L — ABNORMAL LOW (ref 3.5–5.1)
Sodium: 135 mmol/L (ref 135–145)

## 2021-09-04 LAB — CULTURE, BLOOD (ROUTINE X 2)
Culture: NO GROWTH
Culture: NO GROWTH
Special Requests: ADEQUATE
Special Requests: ADEQUATE

## 2021-09-04 LAB — GLUCOSE, CAPILLARY
Glucose-Capillary: 139 mg/dL — ABNORMAL HIGH (ref 70–99)
Glucose-Capillary: 217 mg/dL — ABNORMAL HIGH (ref 70–99)
Glucose-Capillary: 153 mg/dL — ABNORMAL HIGH (ref 70–99)
Glucose-Capillary: 129 mg/dL — ABNORMAL HIGH (ref 70–99)

## 2021-09-04 MED ORDER — VANCOMYCIN HCL IN DEXTROSE 1-5 GM/200ML-% IV SOLN
1000.0000 mg | Freq: Two times a day (BID) | INTRAVENOUS | Status: DC
  Administered 2021-09-04 – 2021-09-08 (×8): 1000 mg via INTRAVENOUS
  Filled 2021-09-04 (×11): qty 200

## 2021-09-04 MED ORDER — POTASSIUM CHLORIDE CRYS ER 20 MEQ PO TBCR
30.0000 meq | EXTENDED_RELEASE_TABLET | Freq: Two times a day (BID) | ORAL | Status: AC
  Administered 2021-09-04 (×2): 30 meq via ORAL
  Filled 2021-09-04 (×2): qty 1

## 2021-09-04 NOTE — Progress Notes (Addendum)
Pharmacy Antibiotic Note  Brian Martin is a 66 y.o. male admitted on 08/30/2021 with right foot ulcer. He presented to the ED with altered mental status and after a fall. Pharmacy has been consulted for vancomycin and Zosyn dosing.  Underwent arteriogram 1/06 and will require above-knee bypass. No osteo or abscess noted on MRI. D6 of antibiotics total. Zosyn switched to Cefepime/Flagyl to minimize AKI risk. No growth on cultures (final).  Afebrile. WBC is stable 11.8. SCr labile but up today at 1.31. (baseline appears to be 1.22 from 03/2021).  Plan: Continue vancomycin 1000 mg every 12 hours (nomogram) Continue Cefepime + Flagyl F/u LOT and de-escalation after bypass - unclear how much blood flow the wound as Monitor for signs of clinical improvement Monitor renal function  Temp (24hrs), Avg:98.2 F (36.8 C), Min:97.7 F (36.5 C), Max:98.9 F (37.2 C)  Recent Labs  Lab 08/30/21 1213 08/30/21 1540 08/30/21 1634 08/30/21 1911 08/31/21 0313 09/01/21 0332 09/03/21 0158 09/04/21 0149  WBC  --  16.1*  --   --  13.8* 11.2* 11.8*  --   CREATININE 1.44*  --   --   --  1.23 1.36* 1.10 1.31*  LATICACIDVEN  --   --  1.7 1.4  --   --   --   --      Estimated Creatinine Clearance: 68.4 mL/min (A) (by C-G formula based on SCr of 1.31 mg/dL (H)).    No Known Allergies  Antimicrobials this admission: 09/02/21 Cefepime >> 09/02/21 Flagyl >> 08/30/21 Vancomycin >>  08/30/21 Zosyn >> 09/01/21 08/30/21 Cephalexin x 1 08/30/21 Cefazolin x 1   Microbiology results: 08/30/21 BCx: NGF            MRSA PCR: positive  Thank you for involving pharmacy in this patient's care.  Laurey Arrow, PharmD PGY1 Pharmacy Resident 09/04/2021  1:32 PM  Please check [email or web address] for unit-specific pharmacy phone numbers.

## 2021-09-04 NOTE — Progress Notes (Signed)
PROGRESS NOTE  Brian Martin GYB:[id number] DOB: 1956/08/02 DOA: 08/30/2021 PCP: Marco Collie, MD   LOS: 5 days   Brief Narrative / Interim history: Brian Martin is a 66 y.o. male with medical history significant of nursing home resident for cognition declining, PVD status post left BKA, PAF on Eliquis, chronic diastolic CHF, IDDM, HTN, presented with frequent falls.  He also was complaining of right foot ulcer that was developed from November, completed a course of antibiotics but also remained persistent.  There is also reports about his cognition recently declining and was sent to the assisted living facility by family.  Subjective / 24h Interval events: Eating better, has no complaints.  No chest pain  Assessment & Plan: Principal Problem Right foot ulcer-there was initial concern for osteomyelitis, however MRI done on admission was negative for that.  ABIs obtained showed progression of his peripheral vascular disease on the right lower extremity.  Vascular surgery consulted, he underwent arteriogram 1/6 without endovascular revascularization options based on angiography, he is being considered for bypass surgery next week.  Active Problems Peripheral vascular disease-status post left AKA.  Right-sided ABI as above, as above, potential bypass surgery next week.  Discussed with Dr. Unk Lightning  CAD -CT scan with coronary artery calcification.  Given high risk vascular surgery, cardiology consulted for preop clearance.  Cardiology plans to do cardiac cath tomorrow  Hypokalemia-continue to replete, monitor  Leukocytosis-due to foot ulcer infection-improving, continue antibiotics  Chronic kidney disease stage IIIa-Baseline creatinine 1.2-1.5, currently at baseline, monitor  Essential hypertension-continue Coreg  Hyperlipidemia-continue statin  Depression-continue Zoloft  CKD 2-creatinine at baseline  Paroxysmal A. fib-in sinus, continue Coreg, Eliquis on hold pending potential  surgery next week  Chronic combined systolic and diastolic CHF-euvolemic, continue Coreg, furosemide  Type 2 diabetes mellitus, with hyperglycemia-A1c 7.0.  Continue long-acting and mealtime insulin.  CBGs with good control as below  CBG (last 3)  Recent Labs    09/03/21 2001 09/04/21 0631 09/04/21 1120  GLUCAP 129* 139* 217*     Scheduled Meds:  aspirin  81 mg Oral Pre-Cath   aspirin EC  81 mg Oral Daily   benzonatate  200 mg Oral TID   carvedilol  25 mg Oral BID WC   Chlorhexidine Gluconate Cloth  6 each Topical Q0600   docusate sodium  100 mg Oral BID   furosemide  20 mg Oral Daily   guaiFENesin  1,200 mg Oral BID   insulin aspart  0-15 Units Subcutaneous TID WC   insulin aspart  3 Units Subcutaneous TID WC   insulin glargine-yfgn  25 Units Subcutaneous q morning   mometasone-formoterol  2 puff Inhalation BID   mupirocin ointment  1 application Nasal BID   pantoprazole  40 mg Oral BID AC   potassium chloride  30 mEq Oral BID   rosuvastatin  20 mg Oral Daily   sertraline  25 mg Oral Daily   sodium chloride flush  3 mL Intravenous Q12H   sodium chloride flush  3 mL Intravenous Q12H   Continuous Infusions:  sodium chloride     sodium chloride     sodium chloride     ceFEPime (MAXIPIME) IV 2 g (09/04/21 8768)   metronidazole 500 mg (09/04/21 0414)   vancomycin 1,750 mg (09/04/21 0930)   PRN Meds:.sodium chloride, sodium chloride, acetaminophen, albuterol, hydrALAZINE, labetalol, LORazepam, ondansetron (ZOFRAN) IV, ondansetron **OR** [DISCONTINUED] ondansetron (ZOFRAN) IV, sodium chloride flush, sodium chloride flush  Diet Orders (From admission, onward)  Start     Ordered   09/05/21 0001  Diet NPO time specified Except for: Sips with Meds  Diet effective midnight       Comments: NPO for solid foods after midnight, may have clear liquids until 5am, then NPO (this would be for inpatients and outpatients)  Question:  Except for  Answer:  Sips with Meds   09/03/21  1711   09/02/21 1339  Diet Carb Modified Fluid consistency: Thin; Room service appropriate? Yes  Diet effective now       Question Answer Comment  Diet-HS Snack? Nothing   Calorie Level Medium 1600-2000   Fluid consistency: Thin   Room service appropriate? Yes      09/02/21 1338            DVT prophylaxis: SCD's Start: 09/02/21 1339     Code Status: Full Code  Family Communication: Wife at bedside  Status is: Inpatient  Remains inpatient appropriate because: Pending vascular intervention   Level of care: Med-Surg  Consultants:  none  Procedures:  none  Microbiology  Negative so far  Antimicrobials: Vancomycin / Zosyn    Objective: Vitals:   09/03/21 2341 09/04/21 0414 09/04/21 0833 09/04/21 1122  BP: (!) 145/72 113/78 139/73 138/71  Pulse: 73 76 74 73  Resp: 19 13 20 19   Temp: 98.3 F (36.8 C) 98.3 F (36.8 C) 98 F (36.7 C) 97.7 F (36.5 C)  TempSrc: Oral Oral Oral Oral  SpO2: 98%  98% 99%  Weight:      Height:        Intake/Output Summary (Last 24 hours) at 09/04/2021 1214 Last data filed at 09/04/2021 1122 Gross per 24 hour  Intake 1118 ml  Output 2250 ml  Net -1132 ml    Filed Weights   08/30/21 2121  Weight: 98.1 kg    Examination:  Constitutional: NAD Eyes: Anicteric ENMT: MMM Neck: normal, supple Respiratory: CTA Cardiovascular: Regular, no murmurs, no edema Abdomen: Soft, ND, BS positive Musculoskeletal: no clubbing / cyanosis.  Skin: No new rashes Neurologic: Nonfocal  Data Reviewed: I have independently reviewed following labs and imaging studies   CBC: Recent Labs  Lab 08/30/21 1540 08/31/21 0313 09/01/21 0332 09/03/21 0158  WBC 16.1* 13.8* 11.2* 11.8*  NEUTROABS 13.0*  --   --   --   HGB 10.8* 9.8* 9.7* 9.5*  HCT 34.3* 30.4* 30.2* 29.7*  MCV 98.0 95.6 96.2 96.4  PLT 241 196 215 644    Basic Metabolic Panel: Recent Labs  Lab 08/30/21 1213 08/31/21 0313 09/01/21 0332 09/03/21 0158 09/04/21 0149  NA  139 141 137 132* 135  K 4.0 3.2* 4.0 3.6 3.4*  CL 106 112* 107 106 109  CO2 23 23 22 22  21*  GLUCOSE 252* 106* 180* 173* 134*  BUN 31* 28* 27* 21 20  CREATININE 1.44* 1.23 1.36* 1.10 1.31*  CALCIUM 8.5* 8.1* 8.2* 8.2* 8.1*    Liver Function Tests: Recent Labs  Lab 08/30/21 1213 09/03/21 0158  AST 44* 20  ALT 18 17  ALKPHOS 58 43  BILITOT 0.9 0.9  PROT 6.5 5.7*  ALBUMIN 3.2* 2.3*    Coagulation Profile: No results for input(s): INR, PROTIME in the last 168 hours. HbA1C: No results for input(s): HGBA1C in the last 72 hours.  CBG: Recent Labs  Lab 09/03/21 1213 09/03/21 1647 09/03/21 2001 09/04/21 0631 09/04/21 1120  GLUCAP 209* 129* 129* 139* 217*     Recent Results (from the past 240 hour(s))  Resp  Panel by RT-PCR (Flu A&B, Covid) Nasopharyngeal Swab     Status: None   Collection Time: 08/30/21 11:47 AM   Specimen: Nasopharyngeal Swab; Nasopharyngeal(NP) swabs in vial transport medium  Result Value Ref Range Status   SARS Coronavirus 2 by RT PCR NEGATIVE NEGATIVE Final    Comment: (NOTE) SARS-CoV-2 target nucleic acids are NOT DETECTED.  The SARS-CoV-2 RNA is generally detectable in upper respiratory specimens during the acute phase of infection. The lowest concentration of SARS-CoV-2 viral copies this assay can detect is 138 copies/mL. A negative result does not preclude SARS-Cov-2 infection and should not be used as the sole basis for treatment or other patient management decisions. A negative result may occur with  improper specimen collection/handling, submission of specimen other than nasopharyngeal swab, presence of viral mutation(s) within the areas targeted by this assay, and inadequate number of viral copies(<138 copies/mL). A negative result must be combined with clinical observations, patient history, and epidemiological information. The expected result is Negative.  Fact Sheet for Patients:  EntrepreneurPulse.com.au  Fact  Sheet for Healthcare Providers:  IncredibleEmployment.be  This test is no t yet approved or cleared by the [place] FDA and  has been authorized for detection and/or diagnosis of SARS-CoV-2 by FDA under an Emergency Use Authorization (EUA). This EUA will remain  in effect (meaning this test can be used) for the duration of the COVID-19 declaration under Section 564(b)(1) of the Act, 21 U.S.C.section 360bbb-3(b)(1), unless the authorization is terminated  or revoked sooner.       Influenza A by PCR NEGATIVE NEGATIVE Final   Influenza B by PCR NEGATIVE NEGATIVE Final    Comment: (NOTE) The Xpert Xpress SARS-CoV-2/FLU/RSV plus assay is intended as an aid in the diagnosis of influenza from Nasopharyngeal swab specimens and should not be used as a sole basis for treatment. Nasal washings and aspirates are unacceptable for Xpert Xpress SARS-CoV-2/FLU/RSV testing.  Fact Sheet for Patients: EntrepreneurPulse.com.au  Fact Sheet for Healthcare Providers: IncredibleEmployment.be  This test is not yet approved or cleared by the [place] FDA and has been authorized for detection and/or diagnosis of SARS-CoV-2 by FDA under an Emergency Use Authorization (EUA). This EUA will remain in effect (meaning this test can be used) for the duration of the COVID-19 declaration under Section 564(b)(1) of the Act, 21 U.S.C. section 360bbb-3(b)(1), unless the authorization is terminated or revoked.  Performed at [facility], [place] [address], [place], [place] [address]   Blood culture (routine x 2)     Status: None   Collection Time: 08/30/21  4:34 PM   Specimen: BLOOD  Result Value Ref Range Status   Specimen Description BLOOD RIGHT ANTECUBITAL  Final   Special Requests   Final    BOTTLES DRAWN AEROBIC AND ANAEROBIC Blood Culture adequate volume   Culture   Final    NO GROWTH 5 DAYS Performed at [facility], [place]  [address], [place], [place] [address]    Report Status 09/04/2021 FINAL  Final  Blood culture (routine x 2)     Status: None   Collection Time: 08/30/21  4:36 PM   Specimen: BLOOD  Result Value Ref Range Status   Specimen Description BLOOD LEFT ANTECUBITAL  Final   Special Requests   Final    BOTTLES DRAWN AEROBIC AND ANAEROBIC Blood Culture adequate volume   Culture   Final    NO GROWTH 5 DAYS Performed at [facility], [place] [address], [place], [place]  [address]    Report Status 09/04/2021 FINAL  Final  MRSA Next Gen by PCR, Nasal     Status: Abnormal   Collection Time: 08/31/21  6:01 AM   Specimen: Nasal Mucosa; Nasal Swab  Result Value Ref Range Status   MRSA by PCR Next Gen DETECTED (A) NOT DETECTED Final    Comment: RESULT CALLED TO, READ BACK BY AND VERIFIED WITH: Earl Lites RN, AT [phone] 08/31/21 D. VANHOOK (NOTE) The GeneXpert MRSA Assay (FDA approved for NASAL specimens only), is one component of a comprehensive MRSA colonization surveillance program. It is not intended to diagnose MRSA infection nor to guide or monitor treatment for MRSA infections. Test performance is not FDA approved in patients less than 8 years old. Performed at [facility], [place] [address] [place], [place] [address]       Radiology Studies: VAS [place] LOWER EXTREMITY SAPHENOUS VEIN MAPPING  Result Date: 09/03/2021 LOWER EXTREMITY VEIN MAPPING Patient Name:  ANAY RATHE  Date of Exam:   09/03/2021 Medical Rec #: [id number]        Accession #:    [id number] Date of Birth: 17-Dec-1955        Patient Gender: M Patient Age:   74 years Exam Location:  [facility] Procedure:      VAS [place] LOWER EXTREMITY SAPHENOUS VEIN MAPPING Referring Phys: Harold Barban --------------------------------------------------------------------------------  Indications:        Pre-op Other Indications:  Right ulceration History:            History of PAD; patient is pre-operative for lower extremity                      bypass graft. Other Risk Factors: History of left BKA.  Comparison Study: No prior study Performing Technologist: Sharion Dove RVS  Examination Guidelines: A complete evaluation includes B-mode imaging, spectral Doppler, color Doppler, and power Doppler as needed of all accessible portions of each vessel. Bilateral testing is considered an integral part of a complete examination. Limited examinations for reoccurring indications may be performed as noted. +---------------+-----------+----------------------+---------------+-----------+    RT Diameter   RT Findings          GSV             LT Diameter   LT Findings        (cm)                                               (cm)                    +---------------+-----------+----------------------+---------------+-----------+       0.67                       Saphenofemoral                                                                      Junction                                     +---------------+-----------+----------------------+---------------+-----------+  0.55                       Proximal thigh                                  +---------------+-----------+----------------------+---------------+-----------+       0.52        branching        Mid thigh                                     +---------------+-----------+----------------------+---------------+-----------+       0.63        branching       Distal thigh                                   +---------------+-----------+----------------------+---------------+-----------+       0.57                            Knee                                       +---------------+-----------+----------------------+---------------+-----------+       0.33                         Prox calf                                     +---------------+-----------+----------------------+---------------+-----------+       0.35        branching         Mid calf                                      +---------------+-----------+----------------------+---------------+-----------+       0.25        branching       Distal calf                                    +---------------+-----------+----------------------+---------------+-----------+       0.19        branching          Ankle                                       +---------------+-----------+----------------------+---------------+-----------+    Preliminary    ECHOCARDIOGRAM COMPLETE  Result Date: 09/03/2021    ECHOCARDIOGRAM REPORT   Patient Name:   Brian Martin Date of Exam: 09/03/2021 Medical Rec #:  [id number]       Height:       73.0 in Accession #:    [id number]      Weight:       216.3 lb Date of Birth:  06-28-1956       BSA:  2.224 m Patient Age:    65 years        BP:           135/78 mmHg Patient Gender: M               HR:           81 bpm. Exam Location:  Inpatient Procedure: 2D Echo, Cardiac Doppler and Color Doppler Indications:    CHF  History:        Patient has prior history of Echocardiogram examinations, most                 recent 05/05/2015. Arrythmias:Atrial Fibrillation; Risk                 Factors:Diabetes, Hypertension and Dyslipidemia. CKD.  Sonographer:    Clayton Lefort RDCS (AE) Referring Phys: [id number] [place]  1. Left ventricular ejection fraction, by estimation, is 55 to 60%. The left ventricle has normal function. The left ventricle has no regional wall motion abnormalities. Left ventricular diastolic parameters were normal.  2. Right ventricular systolic function is normal. The right ventricular size is normal.  3. Left atrial size was mildly dilated.  4. The mitral valve is normal in structure. No evidence of mitral valve regurgitation. No evidence of mitral stenosis.  5. The aortic valve is normal in structure. Aortic valve regurgitation is not visualized. No aortic stenosis is present.  6. The inferior vena cava is dilated in size with <50% respiratory variability, suggesting right atrial  pressure of 15 mmHg. FINDINGS  Left Ventricle: Left ventricular ejection fraction, by estimation, is 55 to 60%. The left ventricle has normal function. The left ventricle has no regional wall motion abnormalities. The left ventricular internal cavity size was normal in size. There is  no left ventricular hypertrophy. Left ventricular diastolic parameters were normal. Right Ventricle: The right ventricular size is normal. No increase in right ventricular wall thickness. Right ventricular systolic function is normal. Left Atrium: Left atrial size was mildly dilated. Right Atrium: Right atrial size was normal in size. Pericardium: There is no evidence of pericardial effusion. Mitral Valve: The mitral valve is normal in structure. Mild mitral annular calcification. No evidence of mitral valve regurgitation. No evidence of mitral valve stenosis. Tricuspid Valve: The tricuspid valve is normal in structure. Tricuspid valve regurgitation is not demonstrated. No evidence of tricuspid stenosis. Aortic Valve: The aortic valve is normal in structure. Aortic valve regurgitation is not visualized. No aortic stenosis is present. Aortic valve mean gradient measures 2.0 mmHg. Aortic valve peak gradient measures 4.5 mmHg. Aortic valve area, by VTI measures 4.13 cm. Pulmonic Valve: The pulmonic valve was normal in structure. Pulmonic valve regurgitation is not visualized. No evidence of pulmonic stenosis. Aorta: The aortic root is normal in size and structure. Venous: The inferior vena cava is dilated in size with less than 50% respiratory variability, suggesting right atrial pressure of 15 mmHg. IAS/Shunts: No atrial level shunt detected by color flow Doppler.  LEFT VENTRICLE PLAX 2D LVIDd:         5.20 cm LVIDs:         3.70 cm LV PW:         1.30 cm LV IVS:        0.90 cm LVOT diam:     2.50 cm LV SV:         84 LV SV Index:   38 LVOT Area:     4.91 cm  RIGHT  VENTRICLE             IVC RV Basal diam:  2.90 cm     IVC diam: 2.10 cm  RV S prime:     13.80 cm/s TAPSE (M-mode): 1.6 cm LEFT ATRIUM             Index        RIGHT ATRIUM           Index LA diam:        4.30 cm 1.93 cm/m   RA Area:     13.70 cm LA Vol (A2C):   87.9 ml 39.52 ml/m  RA Volume:   28.30 ml  12.72 ml/m LA Vol (A4C):   82.8 ml 37.22 ml/m LA Biplane Vol: 88.7 ml 39.88 ml/m  AORTIC VALVE AV Area (Vmax):    4.21 cm AV Area (Vmean):   4.13 cm AV Area (VTI):     4.13 cm AV Vmax:           106.00 cm/s AV Vmean:          71.600 cm/s AV VTI:            0.203 m AV Peak Grad:      4.5 mmHg AV Mean Grad:      2.0 mmHg LVOT Vmax:         91.00 cm/s LVOT Vmean:        60.200 cm/s LVOT VTI:          0.171 m LVOT/AV VTI ratio: 0.84  AORTA Ao Root diam: 3.50 cm  SHUNTS Systemic VTI:  0.17 m Systemic Diam: 2.50 cm Candee Furbish MD Electronically signed by Candee Furbish MD Signature Date/Time: 09/03/2021/4:29:16 PM    Final      Marzetta Board, MD, PhD [facility]  Between 7 am - 7 pm I am available, please contact me via Amion (for emergencies) or Securechat (non urgent messages)  Between 7 pm - 7 am I am not available, please contact night coverage MD/APP via Amion

## 2021-09-04 NOTE — Progress Notes (Signed)
Progress Note  Patient Name: Brian Martin Date of Encounter: 09/04/2021  [facility] Cardiologist: None   Subjective   No overt chest discomfort resting comfortably in bed wife present.  Inpatient Medications    Scheduled Meds:  aspirin  81 mg Oral Pre-Cath   aspirin EC  81 mg Oral Daily   benzonatate  200 mg Oral TID   carvedilol  25 mg Oral BID WC   Chlorhexidine Gluconate Cloth  6 each Topical Q0600   docusate sodium  100 mg Oral BID   furosemide  20 mg Oral Daily   guaiFENesin  1,200 mg Oral BID   insulin aspart  0-15 Units Subcutaneous TID WC   insulin aspart  3 Units Subcutaneous TID WC   insulin glargine-yfgn  25 Units Subcutaneous q morning   mometasone-formoterol  2 puff Inhalation BID   mupirocin ointment  1 application Nasal BID   pantoprazole  40 mg Oral BID AC   rosuvastatin  20 mg Oral Daily   sertraline  25 mg Oral Daily   sodium chloride flush  3 mL Intravenous Q12H   sodium chloride flush  3 mL Intravenous Q12H   Continuous Infusions:  sodium chloride     sodium chloride     sodium chloride     ceFEPime (MAXIPIME) IV 2 g (09/04/21 9937)   metronidazole 500 mg (09/04/21 0414)   vancomycin 1,750 mg (09/04/21 0930)   PRN Meds: sodium chloride, sodium chloride, acetaminophen, albuterol, hydrALAZINE, labetalol, LORazepam, ondansetron (ZOFRAN) IV, ondansetron **OR** [DISCONTINUED] ondansetron (ZOFRAN) IV, sodium chloride flush, sodium chloride flush   Vital Signs    Vitals:   09/03/21 1934 09/03/21 2341 09/04/21 0414 09/04/21 0833  BP: (!) 128/57 (!) 145/72 113/78 139/73  Pulse: 77 73 76 74  Resp: 19 19 13 20   Temp: 98 F (36.7 C) 98.3 F (36.8 C) 98.3 F (36.8 C) 98 F (36.7 C)  TempSrc: Oral Oral Oral Oral  SpO2: 96% 98%  98%  Weight:      Height:        Intake/Output Summary (Last 24 hours) at 09/04/2021 1004 Last data filed at 09/04/2021 0924 Gross per 24 hour  Intake 1118 ml  Output 1600 ml  Net -482 ml   Last 3 Weights 08/30/2021  12/02/2018 11/21/2017  Weight (lbs) 216 lb 4.3 oz 225 lb 231 lb  Weight (kg) 98.1 kg 102.059 kg 104.781 kg      Telemetry    No adverse arrhythmias.  Occasional PVCs noted.- Personally Reviewed  ECG    Sinus rhythm left axis deviation- Personally Reviewed  Physical Exam   GEN: No acute distress.   Neck: No JVD Cardiac: RRR, no murmurs, rubs, or gallops.  Respiratory: Clear to auscultation bilaterally. GI: Soft, nontender, non-distended  MS: Left BKA, right lower extremity wrapped Neuro:  Nonfocal  Psych: Normal affect   Labs    High Sensitivity Troponin:   Recent Labs  Lab 08/30/21 1213 08/30/21 1440  TROPONINIHS 23* 22*     Chemistry Recent Labs  Lab 08/30/21 1213 08/31/21 0313 09/01/21 0332 09/03/21 0158 09/04/21 0149  NA 139   < > 137 132* 135  K 4.0   < > 4.0 3.6 3.4*  CL 106   < > 107 106 109  CO2 23   < > 22 22 21*  GLUCOSE 252*   < > 180* 173* 134*  BUN 31*   < > 27* 21 20  CREATININE 1.44*   < >  1.36* 1.10 1.31*  CALCIUM 8.5*   < > 8.2* 8.2* 8.1*  PROT 6.5  --   --  5.7*  --   ALBUMIN 3.2*  --   --  2.3*  --   AST 44*  --   --  20  --   ALT 18  --   --  17  --   ALKPHOS 58  --   --  43  --   BILITOT 0.9  --   --  0.9  --   GFRNONAA 54*   < > 57* >60 >60  ANIONGAP 10   < > 8 4* 5   < > = values in this interval not displayed.    Lipids No results for input(s): CHOL, TRIG, HDL, LABVLDL, LDLCALC, CHOLHDL in the last 168 hours.  Hematology Recent Labs  Lab 08/31/21 0313 09/01/21 0332 09/03/21 0158  WBC 13.8* 11.2* 11.8*  RBC 3.18* 3.14* 3.08*  HGB 9.8* 9.7* 9.5*  HCT 30.4* 30.2* 29.7*  MCV 95.6 96.2 96.4  MCH 30.8 30.9 30.8  MCHC 32.2 32.1 32.0  RDW 13.0 13.1 12.7  PLT 196 215 243   Thyroid  Recent Labs  Lab 08/30/21 1213  TSH 1.194    BNP Recent Labs  Lab 08/30/21 1213  BNP 117.2*    DDimer No results for input(s): DDIMER in the last 168 hours.   Radiology    VAS [place] LOWER EXTREMITY SAPHENOUS VEIN MAPPING  Result Date:  09/03/2021 LOWER EXTREMITY VEIN MAPPING Patient Name:  Brian Martin  Date of Exam:   09/03/2021 Medical Rec #: [id number]        Accession #:    [id number] Date of Birth: [birth year]        Patient Gender: M Patient Age:   66 years Exam Location:  [facility] Procedure:      VAS [place] LOWER EXTREMITY SAPHENOUS VEIN MAPPING Referring Phys: Harold Barban --------------------------------------------------------------------------------  Indications:        Pre-op Other Indications:  Right ulceration History:            History of PAD; patient is pre-operative for lower extremity                     bypass graft. Other Risk Factors: History of left BKA.  Comparison Study: No prior study Performing Technologist: Sharion Dove RVS  Examination Guidelines: A complete evaluation includes B-mode imaging, spectral Doppler, color Doppler, and power Doppler as needed of all accessible portions of each vessel. Bilateral testing is considered an integral part of a complete examination. Limited examinations for reoccurring indications may be performed as noted. +---------------+-----------+----------------------+---------------+-----------+    RT Diameter   RT Findings          GSV             LT Diameter   LT Findings        (cm)                                               (cm)                    +---------------+-----------+----------------------+---------------+-----------+       0.67                       Saphenofemoral  Junction                                     +---------------+-----------+----------------------+---------------+-----------+       0.55                       Proximal thigh                                  +---------------+-----------+----------------------+---------------+-----------+       0.52        branching        Mid thigh                                      +---------------+-----------+----------------------+---------------+-----------+       0.63        branching       Distal thigh                                   +---------------+-----------+----------------------+---------------+-----------+       0.57                            Knee                                       +---------------+-----------+----------------------+---------------+-----------+       0.33                         Prox calf                                     +---------------+-----------+----------------------+---------------+-----------+       0.35        branching         Mid calf                                     +---------------+-----------+----------------------+---------------+-----------+       0.25        branching       Distal calf                                    +---------------+-----------+----------------------+---------------+-----------+       0.19        branching          Ankle                                       +---------------+-----------+----------------------+---------------+-----------+    Preliminary    ECHOCARDIOGRAM COMPLETE  Result Date: 09/03/2021    ECHOCARDIOGRAM REPORT   Patient Name:   Brian Martin Date of Exam: 09/03/2021 Medical Rec #:  [id number]       Height:  73.0 in Accession #:    [id number]      Weight:       216.3 lb Date of Birth:  1955/10/25       BSA:          2.224 m Patient Age:    12 years        BP:           135/78 mmHg Patient Gender: M               HR:           81 bpm. Exam Location:  Inpatient Procedure: 2D Echo, Cardiac Doppler and Color Doppler Indications:    CHF  History:        Patient has prior history of Echocardiogram examinations, most                 recent 05/05/2015. Arrythmias:Atrial Fibrillation; Risk                 Factors:Diabetes, Hypertension and Dyslipidemia. CKD.  Sonographer:    Clayton Lefort RDCS (AE) Referring Phys: [id number] [place]  1. Left ventricular ejection fraction, by estimation,  is 55 to 60%. The left ventricle has normal function. The left ventricle has no regional wall motion abnormalities. Left ventricular diastolic parameters were normal.  2. Right ventricular systolic function is normal. The right ventricular size is normal.  3. Left atrial size was mildly dilated.  4. The mitral valve is normal in structure. No evidence of mitral valve regurgitation. No evidence of mitral stenosis.  5. The aortic valve is normal in structure. Aortic valve regurgitation is not visualized. No aortic stenosis is present.  6. The inferior vena cava is dilated in size with <50% respiratory variability, suggesting right atrial pressure of 15 mmHg. FINDINGS  Left Ventricle: Left ventricular ejection fraction, by estimation, is 55 to 60%. The left ventricle has normal function. The left ventricle has no regional wall motion abnormalities. The left ventricular internal cavity size was normal in size. There is  no left ventricular hypertrophy. Left ventricular diastolic parameters were normal. Right Ventricle: The right ventricular size is normal. No increase in right ventricular wall thickness. Right ventricular systolic function is normal. Left Atrium: Left atrial size was mildly dilated. Right Atrium: Right atrial size was normal in size. Pericardium: There is no evidence of pericardial effusion. Mitral Valve: The mitral valve is normal in structure. Mild mitral annular calcification. No evidence of mitral valve regurgitation. No evidence of mitral valve stenosis. Tricuspid Valve: The tricuspid valve is normal in structure. Tricuspid valve regurgitation is not demonstrated. No evidence of tricuspid stenosis. Aortic Valve: The aortic valve is normal in structure. Aortic valve regurgitation is not visualized. No aortic stenosis is present. Aortic valve mean gradient measures 2.0 mmHg. Aortic valve peak gradient measures 4.5 mmHg. Aortic valve area, by VTI measures 4.13 cm. Pulmonic Valve: The pulmonic valve  was normal in structure. Pulmonic valve regurgitation is not visualized. No evidence of pulmonic stenosis. Aorta: The aortic root is normal in size and structure. Venous: The inferior vena cava is dilated in size with less than 50% respiratory variability, suggesting right atrial pressure of 15 mmHg. IAS/Shunts: No atrial level shunt detected by color flow Doppler.  LEFT VENTRICLE PLAX 2D LVIDd:         5.20 cm LVIDs:         3.70 cm LV PW:         1.30 cm LV IVS:  0.90 cm LVOT diam:     2.50 cm LV SV:         84 LV SV Index:   38 LVOT Area:     4.91 cm  RIGHT VENTRICLE             IVC RV Basal diam:  2.90 cm     IVC diam: 2.10 cm RV S prime:     13.80 cm/s TAPSE (M-mode): 1.6 cm LEFT ATRIUM             Index        RIGHT ATRIUM           Index LA diam:        4.30 cm 1.93 cm/m   RA Area:     13.70 cm LA Vol (A2C):   87.9 ml 39.52 ml/m  RA Volume:   28.30 ml  12.72 ml/m LA Vol (A4C):   82.8 ml 37.22 ml/m LA Biplane Vol: 88.7 ml 39.88 ml/m  AORTIC VALVE AV Area (Vmax):    4.21 cm AV Area (Vmean):   4.13 cm AV Area (VTI):     4.13 cm AV Vmax:           106.00 cm/s AV Vmean:          71.600 cm/s AV VTI:            0.203 m AV Peak Grad:      4.5 mmHg AV Mean Grad:      2.0 mmHg LVOT Vmax:         91.00 cm/s LVOT Vmean:        60.200 cm/s LVOT VTI:          0.171 m LVOT/AV VTI ratio: 0.84  AORTA Ao Root diam: 3.50 cm  SHUNTS Systemic VTI:  0.17 m Systemic Diam: 2.50 cm Candee Furbish MD Electronically signed by Candee Furbish MD Signature Date/Time: 09/03/2021/4:29:16 PM    Final     Cardiac Studies   Prior CT of chest reviewed-coronary calcification noted  ECHO 09/03/21:   1. Left ventricular ejection fraction, by estimation, is 55 to 60%. The  left ventricle has normal function. The left ventricle has no regional  wall motion abnormalities. Left ventricular diastolic parameters were  normal.   2. Right ventricular systolic function is normal. The right ventricular  size is normal.   3. Left atrial  size was mildly dilated.   4. The mitral valve is normal in structure. No evidence of mitral valve  regurgitation. No evidence of mitral stenosis.   5. The aortic valve is normal in structure. Aortic valve regurgitation is  not visualized. No aortic stenosis is present.   6. The inferior vena cava is dilated in size with <50% respiratory  variability, suggesting right atrial pressure of 15 mmHg.   Patient Profile     66 y.o. male with severe peripheral vascular disease awaiting right lower extremity arterial bypass with prior left BKA.  Recent chest CT demonstrates quite significant coronary artery calcification.  Assessment & Plan    Preoperative risk assessment - Awaiting left and right heart catheterization tomorrow.  Reason for right heart catheterization as well is to look for any signs of constriction hemodynamically given some pericardial calcifications noted. -Further risk stratification based upon this angiogram.  Hopefully if he does not have any severe proximal coronary disease, he may proceed with lower extremity bypass.  Coronary artery calcification - Left main RCA LAD noted on chest CT. - Started  aspirin 81 mg  Pericardial calcification - He has not had any prior radiation history.  This was seen on CT scan.  Thin rind of calcified pericardium noted.  Thought it may be beneficial to perform right and left heart catheterization to exclude hemodynamics of constriction. -IVC did appear to be dilated on echocardiogram  Hyperlipidemia - Change simvastatin 10 over to rosuvastatin 20, high intensity.  Mildly elevated troponin - Flat, demand ischemia in the setting of his underlying vascular disease.  Chronic kidney disease stage IIIa - Creatinine 1.31 this morning this is baseline.  Hydrate overnight for heart catheterization.  Osteomyelitis - IV cefepime, metronidazole, vancomycin.  Stable    For questions or updates, please contact [place] Please consult  [email or web address] for contact info under        Signed, Candee Furbish, MD  09/04/2021, 10:04 AM

## 2021-09-05 ENCOUNTER — Encounter (HOSPITAL_COMMUNITY)
Admission: EM | Disposition: A | Discharge status: Skilled Nursing Facility | Payer: Self-pay | Source: Skilled Nursing Facility | Attending: Internal Medicine

## 2021-09-05 ENCOUNTER — Encounter (HOSPITAL_COMMUNITY): Payer: Self-pay | Admitting: Cardiovascular Disease

## 2021-09-05 DIAGNOSIS — Z01818 Encounter for other preprocedural examination: Secondary | ICD-10-CM | POA: Diagnosis not present

## 2021-09-05 DIAGNOSIS — I739 Peripheral vascular disease, unspecified: Secondary | ICD-10-CM | POA: Diagnosis not present

## 2021-09-05 DIAGNOSIS — I2510 Atherosclerotic heart disease of native coronary artery without angina pectoris: Secondary | ICD-10-CM | POA: Diagnosis not present

## 2021-09-05 DIAGNOSIS — Z01810 Encounter for preprocedural cardiovascular examination: Secondary | ICD-10-CM | POA: Diagnosis not present

## 2021-09-05 DIAGNOSIS — M8660 Other chronic osteomyelitis, unspecified site: Secondary | ICD-10-CM

## 2021-09-05 HISTORY — PX: RIGHT/LEFT HEART CATH AND CORONARY ANGIOGRAPHY: CATH118266

## 2021-09-05 LAB — BASIC METABOLIC PANEL
Anion gap: 8 (ref 5–15)
BUN: 21 mg/dL (ref 8–23)
CO2: 19 mmol/L — ABNORMAL LOW (ref 22–32)
Calcium: 7.9 mg/dL — ABNORMAL LOW (ref 8.9–10.3)
Chloride: 106 mmol/L (ref 98–111)
Creatinine, Ser: 1.32 mg/dL — ABNORMAL HIGH (ref 0.61–1.24)
GFR, Estimated: 59 mL/min — ABNORMAL LOW (ref >60)
Glucose, Bld: 163 mg/dL — ABNORMAL HIGH (ref 70–99)
Potassium: 3.8 mmol/L (ref 3.5–5.1)
Sodium: 133 mmol/L — ABNORMAL LOW (ref 135–145)

## 2021-09-05 LAB — POCT I-STAT 7, (LYTES, BLD GAS, ICA,H+H)
Acid-base deficit: 5 mmol/L — ABNORMAL HIGH (ref 0.0–2.0)
Bicarbonate: 18.2 mmol/L — ABNORMAL LOW (ref 20.0–28.0)
Calcium, Ion: 1.21 mmol/L (ref 1.15–1.40)
HCT: 26 % — ABNORMAL LOW (ref 39.0–52.0)
Hemoglobin: 8.8 g/dL — ABNORMAL LOW (ref 13.0–17.0)
O2 Saturation: 99 %
Potassium: 4.1 mmol/L (ref 3.5–5.1)
Sodium: 138 mmol/L (ref 135–145)
TCO2: 19 mmol/L — ABNORMAL LOW (ref 22–32)
pCO2 arterial: 27.5 mmHg — ABNORMAL LOW (ref 32.0–48.0)
pH, Arterial: 7.428 (ref 7.350–7.450)
pO2, Arterial: 142 mmHg — ABNORMAL HIGH (ref 83.0–108.0)

## 2021-09-05 LAB — GLUCOSE, CAPILLARY
Glucose-Capillary: 124 mg/dL — ABNORMAL HIGH (ref 70–99)
Glucose-Capillary: 119 mg/dL — ABNORMAL HIGH (ref 70–99)
Glucose-Capillary: 148 mg/dL — ABNORMAL HIGH (ref 70–99)
Glucose-Capillary: 172 mg/dL — ABNORMAL HIGH (ref 70–99)

## 2021-09-05 LAB — CBC
HCT: 27 % — ABNORMAL LOW (ref 39.0–52.0)
Hemoglobin: 8.6 g/dL — ABNORMAL LOW (ref 13.0–17.0)
MCH: 30.5 pg (ref 26.0–34.0)
MCHC: 31.9 g/dL (ref 30.0–36.0)
MCV: 95.7 fL (ref 80.0–100.0)
Platelets: 257 10*3/uL (ref 150–400)
RBC: 2.82 MIL/uL — ABNORMAL LOW (ref 4.22–5.81)
RDW: 12.7 % (ref 11.5–15.5)
WBC: 11.9 10*3/uL — ABNORMAL HIGH (ref 4.0–10.5)
nRBC: 0 % (ref 0.0–0.2)

## 2021-09-05 LAB — POCT I-STAT EG7
Acid-base deficit: 5 mmol/L — ABNORMAL HIGH (ref 0.0–2.0)
Acid-base deficit: 5 mmol/L — ABNORMAL HIGH (ref 0.0–2.0)
Bicarbonate: 19.2 mmol/L — ABNORMAL LOW (ref 20.0–28.0)
Bicarbonate: 19.6 mmol/L — ABNORMAL LOW (ref 20.0–28.0)
Calcium, Ion: 1.23 mmol/L (ref 1.15–1.40)
Calcium, Ion: 1.24 mmol/L (ref 1.15–1.40)
HCT: 26 % — ABNORMAL LOW (ref 39.0–52.0)
HCT: 27 % — ABNORMAL LOW (ref 39.0–52.0)
Hemoglobin: 8.8 g/dL — ABNORMAL LOW (ref 13.0–17.0)
Hemoglobin: 9.2 g/dL — ABNORMAL LOW (ref 13.0–17.0)
O2 Saturation: 60 %
O2 Saturation: 63 %
Potassium: 4 mmol/L (ref 3.5–5.1)
Potassium: 4 mmol/L (ref 3.5–5.1)
Sodium: 138 mmol/L (ref 135–145)
Sodium: 139 mmol/L (ref 135–145)
TCO2: 20 mmol/L — ABNORMAL LOW (ref 22–32)
TCO2: 21 mmol/L — ABNORMAL LOW (ref 22–32)
pCO2, Ven: 31.9 mmHg — ABNORMAL LOW (ref 44.0–60.0)
pCO2, Ven: 32.6 mmHg — ABNORMAL LOW (ref 44.0–60.0)
pH, Ven: 7.388 (ref 7.250–7.430)
pH, Ven: 7.388 (ref 7.250–7.430)
pO2, Ven: 31 mmHg — CL (ref 32.0–45.0)
pO2, Ven: 33 mmHg (ref 32.0–45.0)

## 2021-09-05 SURGERY — RIGHT/LEFT HEART CATH AND CORONARY ANGIOGRAPHY
Anesthesia: LOCAL

## 2021-09-05 MED ORDER — HEPARIN (PORCINE) IN NACL 1000-0.9 UT/500ML-% IV SOLN
INTRAVENOUS | Status: AC
  Filled 2021-09-05: qty 1000

## 2021-09-05 MED ORDER — FENTANYL CITRATE (PF) 100 MCG/2ML IJ SOLN
INTRAMUSCULAR | Status: DC | PRN
  Administered 2021-09-05: 25 ug via INTRAVENOUS

## 2021-09-05 MED ORDER — MORPHINE SULFATE (PF) 2 MG/ML IV SOLN: 2.0000 mg | INTRAVENOUS | Status: DC | PRN

## 2021-09-05 MED ORDER — FENTANYL CITRATE (PF) 100 MCG/2ML IJ SOLN
INTRAMUSCULAR | Status: AC
  Filled 2021-09-05: qty 2

## 2021-09-05 MED ORDER — SODIUM CHLORIDE 0.9% FLUSH
3.0000 mL | Freq: Two times a day (BID) | INTRAVENOUS | Status: DC
  Administered 2021-09-06 – 2021-09-09 (×5): 3 mL via INTRAVENOUS

## 2021-09-05 MED ORDER — ASPIRIN 81 MG PO CHEW: 81.0000 mg | CHEWABLE_TABLET | Freq: Every day | ORAL | Status: DC

## 2021-09-05 MED ORDER — VERAPAMIL HCL 2.5 MG/ML IV SOLN
INTRAVENOUS | Status: AC
  Filled 2021-09-05: qty 2

## 2021-09-05 MED ORDER — ONDANSETRON HCL 4 MG/2ML IJ SOLN: 4.0000 mg | Freq: Four times a day (QID) | INTRAMUSCULAR | Status: DC | PRN

## 2021-09-05 MED ORDER — HEPARIN SODIUM (PORCINE) 1000 UNIT/ML IJ SOLN
INTRAMUSCULAR | Status: DC | PRN
  Administered 2021-09-05: 5000 [IU] via INTRAVENOUS

## 2021-09-05 MED ORDER — LIDOCAINE HCL (PF) 1 % IJ SOLN
INTRAMUSCULAR | Status: AC
  Filled 2021-09-05: qty 30

## 2021-09-05 MED ORDER — SODIUM CHLORIDE 0.9 % IV SOLN: 250.0000 mL | INTRAVENOUS | Status: DC | PRN

## 2021-09-05 MED ORDER — LIDOCAINE HCL (PF) 1 % IJ SOLN
INTRAMUSCULAR | Status: DC | PRN
  Administered 2021-09-05 (×2): 2 mL

## 2021-09-05 MED ORDER — HEPARIN (PORCINE) IN NACL 1000-0.9 UT/500ML-% IV SOLN
INTRAVENOUS | Status: DC | PRN
  Administered 2021-09-05 (×2): 500 mL

## 2021-09-05 MED ORDER — ACETAMINOPHEN 325 MG PO TABS: 650.0000 mg | ORAL_TABLET | ORAL | Status: DC | PRN

## 2021-09-05 MED ORDER — IOHEXOL 350 MG/ML SOLN
INTRAVENOUS | Status: DC | PRN
  Administered 2021-09-05: 30 mL via INTRA_ARTERIAL

## 2021-09-05 MED ORDER — HYDRALAZINE HCL 20 MG/ML IJ SOLN: 10.0000 mg | INTRAMUSCULAR | Status: AC | PRN

## 2021-09-05 MED ORDER — SODIUM CHLORIDE 0.9% FLUSH: 3.0000 mL | INTRAVENOUS | Status: DC | PRN

## 2021-09-05 MED ORDER — HEPARIN SODIUM (PORCINE) 1000 UNIT/ML IJ SOLN
INTRAMUSCULAR | Status: AC
  Filled 2021-09-05: qty 10

## 2021-09-05 MED ORDER — LABETALOL HCL 5 MG/ML IV SOLN: 10.0000 mg | INTRAVENOUS | Status: DC | PRN

## 2021-09-05 MED ORDER — SODIUM CHLORIDE 0.9 % IV SOLN: INTRAVENOUS | Status: AC

## 2021-09-05 MED ORDER — VERAPAMIL HCL 2.5 MG/ML IV SOLN: INTRA_ARTERIAL | Status: DC | PRN

## 2021-09-05 MED ORDER — NITROGLYCERIN 1 MG/10 ML FOR IR/CATH LAB
INTRA_ARTERIAL | Status: AC
  Filled 2021-09-05: qty 10

## 2021-09-05 SURGICAL SUPPLY — 17 items
CATH BALLN WEDGE 5F 110CM (CATHETERS) ×1 IMPLANT
CATH INFINITI 5FR ANG PIGTAIL (CATHETERS) ×1 IMPLANT
CATH OPTITORQUE TIG 4.0 5F (CATHETERS) ×1 IMPLANT
DEVICE RAD COMP TR BAND LRG (VASCULAR PRODUCTS) ×1 IMPLANT
GLIDESHEATH SLEND A-KIT 6F 22G (SHEATH) ×1 IMPLANT
GUIDEWIRE .025 260CM (WIRE) ×1 IMPLANT
GUIDEWIRE INQWIRE 1.5J.035X260 (WIRE) IMPLANT
INQWIRE 1.5J .035X260CM (WIRE) ×2
KIT HEART LEFT (KITS) ×2 IMPLANT
MAT PREVALON FULL STRYKER (MISCELLANEOUS) ×1 IMPLANT
PACK CARDIAC CATHETERIZATION (CUSTOM PROCEDURE TRAY) ×2 IMPLANT
SHEATH GLIDE SLENDER 4/5FR (SHEATH) ×1 IMPLANT
TRANSDUCER W/STOPCOCK (MISCELLANEOUS) ×3 IMPLANT
TUBING ART PRESS 72  MALE/FEM (TUBING) ×1
TUBING ART PRESS 72 MALE/FEM (TUBING) IMPLANT
TUBING CIL FLEX 10 FLL-RA (TUBING) ×2 IMPLANT
WIRE HI TORQ VERSACORE-J 145CM (WIRE) ×1 IMPLANT

## 2021-09-05 NOTE — H&P (View-Only) (Signed)
Progress Note  Patient Name: Brian Martin Date of Encounter: 09/05/2021  Brian Martin [facility] Cardiologist:  Brian Martin   Subjective   No chest pain or dyspnea this am for cath   Inpatient Medications    Scheduled Meds:  aspirin EC  81 mg Oral Daily   benzonatate  200 mg Oral TID   carvedilol  25 mg Oral BID WC   docusate sodium  100 mg Oral BID   furosemide  20 mg Oral Daily   guaiFENesin  1,200 mg Oral BID   insulin aspart  0-15 Units Subcutaneous TID WC   insulin aspart  3 Units Subcutaneous TID WC   insulin glargine-yfgn  25 Units Subcutaneous q morning   mometasone-formoterol  2 puff Inhalation BID   mupirocin ointment  1 application Nasal BID   pantoprazole  40 mg Oral BID AC   rosuvastatin  20 mg Oral Daily   sertraline  25 mg Oral Daily   sodium chloride flush  3 mL Intravenous Q12H   sodium chloride flush  3 mL Intravenous Q12H   Continuous Infusions:  sodium chloride     sodium chloride     sodium chloride 1 mL/kg/hr (09/05/21 0601)   ceFEPime (MAXIPIME) IV 2 g (09/05/21 0558)   metronidazole 500 mg (09/05/21 0254)   vancomycin 1,000 mg (09/04/21 2335)   PRN Meds: sodium chloride, sodium chloride, acetaminophen, albuterol, hydrALAZINE, labetalol, LORazepam, ondansetron (ZOFRAN) IV, ondansetron **OR** [DISCONTINUED] ondansetron (ZOFRAN) IV, sodium chloride flush, sodium chloride flush   Vital Signs    Vitals:   09/04/21 1929 09/04/21 2311 09/05/21 0342 09/05/21 0812  BP: 128/70 134/67 136/67 137/63  Pulse: 76 74  67  Resp: 19 17 18 18   Temp: 98.3 F (36.8 C) 97.8 F (36.6 C) 97.7 F (36.5 C) 97.6 F (36.4 C)  TempSrc: Oral Oral Oral Oral  SpO2: 100% 94% 98% 100%  Weight:      Height:        Intake/Output Summary (Last 24 hours) at 09/05/2021 0833 Last data filed at 09/05/2021 0300 Gross per 24 hour  Intake 720 ml  Output 3550 ml  Net -2830 ml   Last 3 Weights 08/30/2021 12/02/2018 11/21/2017  Weight (lbs) 216 lb 4.3 oz 225 lb 231 lb  Weight (kg) 98.1 kg  102.059 kg 104.781 kg      Telemetry  NSR rare PVC;s 09/05/2021   ECG    Sinus rhythm left axis deviation- Personally Reviewed  Physical Exam   Chronically ill pale male Lungs clear No murmur  Post left BKA PVD RLE with decreased pulse DP marked  No edema  Neuro non focal   Labs    High Sensitivity Troponin:   Recent Labs  Lab 08/30/21 1213 08/30/21 1440  TROPONINIHS 23* 22*     Chemistry Recent Labs  Lab 08/30/21 1213 08/31/21 0313 09/03/21 0158 09/04/21 0149 09/05/21 0157  NA 139   < > 132* 135 133*  K 4.0   < > 3.6 3.4* 3.8  CL 106   < > 106 109 106  CO2 23   < > 22 21* 19*  GLUCOSE 252*   < > 173* 134* 163*  BUN 31*   < > 21 20 21   CREATININE 1.44*   < > 1.10 1.31* 1.32*  CALCIUM 8.5*   < > 8.2* 8.1* 7.9*  PROT 6.5  --  5.7*  --   --   ALBUMIN 3.2*  --  2.3*  --   --  AST 44*  --  20  --   --   ALT 18  --  17  --   --   ALKPHOS 58  --  43  --   --   BILITOT 0.9  --  0.9  --   --   GFRNONAA 54*   < > >60 >60 59*  ANIONGAP 10   < > 4* 5 8   < > = values in this interval not displayed.    Lipids No results for input(s): CHOL, TRIG, HDL, LABVLDL, LDLCALC, CHOLHDL in the last 168 hours.  Hematology Recent Labs  Lab 09/01/21 0332 09/03/21 0158 09/05/21 0157  WBC 11.2* 11.8* 11.9*  RBC 3.14* 3.08* 2.82*  HGB 9.7* 9.5* 8.6*  HCT 30.2* 29.7* 27.0*  MCV 96.2 96.4 95.7  MCH 30.9 30.8 30.5  MCHC 32.1 32.0 31.9  RDW 13.1 12.7 12.7  PLT 215 243 257   Thyroid  Recent Labs  Lab 08/30/21 1213  TSH 1.194    BNP Recent Labs  Lab 08/30/21 1213  BNP 117.2*    DDimer No results for input(s): DDIMER in the last 168 hours.   Radiology    VAS [place] LOWER EXTREMITY SAPHENOUS VEIN MAPPING  Result Date: 09/04/2021 LOWER EXTREMITY VEIN MAPPING Patient Name:  Brian Martin  Date of Exam:   09/03/2021 Medical Rec #: [id number]        Accession #:    [id number] Date of Birth: 1956/05/29        Patient Gender: M Patient Age:   66 years Exam Location:  [facility] Procedure:      VAS [place] LOWER EXTREMITY SAPHENOUS VEIN MAPPING Referring Phys: Brian Martin --------------------------------------------------------------------------------  Indications:        Pre-op Other Indications:  Right ulceration History:            History of PAD; patient is pre-operative for lower extremity                     bypass graft. Other Risk Factors: History of left BKA.  Comparison Study: No prior study Performing Technologist: Brian Martin RVS  Examination Guidelines: A complete evaluation includes B-mode imaging, spectral Doppler, color Doppler, and power Doppler as needed of all accessible portions of each vessel. Bilateral testing is considered an integral part of a complete examination. Limited examinations for reoccurring indications may be performed as noted. +---------------+-----------+----------------------+---------------+-----------+    RT Diameter   RT Findings          GSV             LT Diameter   LT Findings        (cm)                                               (cm)                    +---------------+-----------+----------------------+---------------+-----------+       0.67                       Saphenofemoral  Junction                                     +---------------+-----------+----------------------+---------------+-----------+       0.55                       Proximal thigh                                  +---------------+-----------+----------------------+---------------+-----------+       0.52        branching        Mid thigh                                     +---------------+-----------+----------------------+---------------+-----------+       0.63        branching       Distal thigh                                   +---------------+-----------+----------------------+---------------+-----------+       0.57                            Knee                                        +---------------+-----------+----------------------+---------------+-----------+       0.33                         Prox calf                                     +---------------+-----------+----------------------+---------------+-----------+       0.35        branching         Mid calf                                     +---------------+-----------+----------------------+---------------+-----------+       0.25        branching       Distal calf                                    +---------------+-----------+----------------------+---------------+-----------+       0.19        branching          Ankle                                       +---------------+-----------+----------------------+---------------+-----------+ Diagnosing physician: Orlie Pollen Electronically signed by Orlie Pollen on 09/04/2021 at 2:33:17 PM.    Final    ECHOCARDIOGRAM COMPLETE  Result Date: 09/03/2021    ECHOCARDIOGRAM REPORT   Patient Name:   KRYSTOPHER KUENZEL Date of Exam: 09/03/2021 Medical Rec #:  [id number]       Height:       73.0 in Accession #:    [id number]      Weight:       216.3 lb Date of Birth:  [birth year]       BSA:          2.224 m Patient Age:    71 years        BP:           135/78 mmHg Patient Gender: M               HR:           81 bpm. Exam Location:  Inpatient Procedure: 2D Echo, Cardiac Doppler and Color Doppler Indications:    CHF  History:        Patient has prior history of Echocardiogram examinations, most                 recent 05/05/2015. Arrythmias:Atrial Fibrillation; Risk                 Factors:Diabetes, Hypertension and Dyslipidemia. CKD.  Sonographer:    Clayton Lefort RDCS (AE) Referring Phys: [id number] Sabina  1. Left ventricular ejection fraction, by estimation, is 55 to 60%. The left ventricle has normal function. The left ventricle has no regional wall motion abnormalities. Left ventricular diastolic parameters were normal.  2. Right ventricular systolic function is normal. The right  ventricular size is normal.  3. Left atrial size was mildly dilated.  4. The mitral valve is normal in structure. No evidence of mitral valve regurgitation. No evidence of mitral stenosis.  5. The aortic valve is normal in structure. Aortic valve regurgitation is not visualized. No aortic stenosis is present.  6. The inferior vena cava is dilated in size with <50% respiratory variability, suggesting right atrial pressure of 15 mmHg. FINDINGS  Left Ventricle: Left ventricular ejection fraction, by estimation, is 55 to 60%. The left ventricle has normal function. The left ventricle has no regional wall motion abnormalities. The left ventricular internal cavity size was normal in size. There is  no left ventricular hypertrophy. Left ventricular diastolic parameters were normal. Right Ventricle: The right ventricular size is normal. No increase in right ventricular wall thickness. Right ventricular systolic function is normal. Left Atrium: Left atrial size was mildly dilated. Right Atrium: Right atrial size was normal in size. Pericardium: There is no evidence of pericardial effusion. Mitral Valve: The mitral valve is normal in structure. Mild mitral annular calcification. No evidence of mitral valve regurgitation. No evidence of mitral valve stenosis. Tricuspid Valve: The tricuspid valve is normal in structure. Tricuspid valve regurgitation is not demonstrated. No evidence of tricuspid stenosis. Aortic Valve: The aortic valve is normal in structure. Aortic valve regurgitation is not visualized. No aortic stenosis is present. Aortic valve mean gradient measures 2.0 mmHg. Aortic valve peak gradient measures 4.5 mmHg. Aortic valve area, by VTI measures 4.13 cm. Pulmonic Valve: The pulmonic valve was normal in structure. Pulmonic valve regurgitation is not visualized. No evidence of pulmonic stenosis. Aorta: The aortic root is normal in size and structure. Venous: The inferior vena cava is dilated in size with less than  50% respiratory variability, suggesting right atrial pressure of 15 mmHg. IAS/Shunts: No atrial level shunt detected by color flow Doppler.  LEFT VENTRICLE PLAX 2D LVIDd:         5.20 cm LVIDs:         3.70 cm  LV PW:         1.30 cm LV IVS:        0.90 cm LVOT diam:     2.50 cm LV SV:         84 LV SV Index:   38 LVOT Area:     4.91 cm  RIGHT VENTRICLE             IVC RV Basal diam:  2.90 cm     IVC diam: 2.10 cm RV S prime:     13.80 cm/s TAPSE (M-mode): 1.6 cm LEFT ATRIUM             Index        RIGHT ATRIUM           Index LA diam:        4.30 cm 1.93 cm/m   RA Area:     13.70 cm LA Vol (A2C):   87.9 ml 39.52 ml/m  RA Volume:   28.30 ml  12.72 ml/m LA Vol (A4C):   82.8 ml 37.22 ml/m LA Biplane Vol: 88.7 ml 39.88 ml/m  AORTIC VALVE AV Area (Vmax):    4.21 cm AV Area (Vmean):   4.13 cm AV Area (VTI):     4.13 cm AV Vmax:           106.00 cm/s AV Vmean:          71.600 cm/s AV VTI:            0.203 m AV Peak Grad:      4.5 mmHg AV Mean Grad:      2.0 mmHg LVOT Vmax:         91.00 cm/s LVOT Vmean:        60.200 cm/s LVOT VTI:          0.171 m LVOT/AV VTI ratio: 0.84  AORTA Ao Root diam: 3.50 cm  SHUNTS Systemic VTI:  0.17 m Systemic Diam: 2.50 cm Candee Furbish MD Electronically signed by Candee Furbish MD Signature Date/Time: 09/03/2021/4:29:16 PM    Final     Cardiac Studies   Prior CT of chest reviewed-coronary calcification noted  ECHO 09/03/21:   1. Left ventricular ejection fraction, by estimation, is 55 to 60%. The  left ventricle has normal function. The left ventricle has no regional  wall motion abnormalities. Left ventricular diastolic parameters were  normal.   2. Right ventricular systolic function is normal. The right ventricular  size is normal.   3. Left atrial size was mildly dilated.   4. The mitral valve is normal in structure. No evidence of mitral valve  regurgitation. No evidence of mitral stenosis.   5. The aortic valve is normal in structure. Aortic valve regurgitation is   not visualized. No aortic stenosis is present.   6. The inferior vena cava is dilated in size with <50% respiratory  variability, suggesting right atrial pressure of 15 mmHg.   Patient Profile     66 y.o. male with severe peripheral vascular disease awaiting right lower extremity arterial bypass with prior left BKA.  Recent chest CT demonstrates quite significant coronary artery calcification.  Assessment & Plan    Preoperative risk assessment - Dr Brian Martin felt with degree of coronary calcification , high risk vascular surgery and poor functional status < 5 Mets needs cath. Right heart with simultaneous RV/LV due to pericardial calcification also requested although no evidence of constriction on echo or volume overload   Coronary artery calcification -  Left main RCA LAD noted on chest CT. - Started aspirin 81 mg  Hyperlipidemia - Now on crestor 20 mg daily   Mildly elevated troponin - Flat, demand ischemia in the setting of his underlying vascular disease.  Chronic kidney disease stage IIIa - Creatinine 1.32 this morning this is baseline.  Hydrate overnight for heart catheterization.  Osteomyelitis - IV cefepime, metronidazole, vancomycin.  Stable  Hopefully will not have any obstructive CAD so PV surgery can be done for limb salvage       Signed, Jenkins Rouge, MD  09/05/2021, 8:33 AM

## 2021-09-05 NOTE — Progress Notes (Signed)
PROGRESS NOTE  Brian Martin DOB: Mar 09, 1956 DOA: 08/30/2021 PCP: Marco Collie, MD   LOS: 6 days   Brief Narrative / Interim history: Brian Martin is a 66 y.o. male with medical history significant of nursing home resident for cognition declining, PVD status post left BKA, PAF on Eliquis, chronic diastolic CHF, IDDM, HTN, presented with frequent falls.  He also was complaining of right foot ulcer that was developed from November, completed a course of antibiotics but also remained persistent.  There is also reports about his cognition recently declining and was sent to the assisted living facility by family.  Subjective / 24h Interval events: No complaints this morning.  Has not been eaten anything, awaiting cath  Assessment & Plan: Principal Problem Right foot ulcer-there was initial concern for osteomyelitis, however MRI done on admission was negative for that.  ABIs obtained showed progression of his peripheral vascular disease on the right lower extremity.  Vascular surgery consulted, he underwent arteriogram 1/6 without endovascular revascularization options based on angiography, he is being considered for bypass surgery later this week  Active Problems Peripheral vascular disease-status post left AKA.  Right-sided ABI as above, as above, potential bypass surgery later this week  CAD -CT scan with coronary artery calcification.  Given high risk vascular surgery, cardiology consulted for preop clearance.  He is scheduled for cardiac catheterization today  Hypokalemia-continue to replete, monitor  Leukocytosis-due to foot ulcer infection-improving, continue antibiotics  Chronic kidney disease stage IIIa-Baseline creatinine 1.2-1.5, currently at baseline this morning.  Essential hypertension-continue Coreg  Hyperlipidemia-continue statin  Depression-continue Zoloft  CKD 2-creatinine at baseline  Paroxysmal A. fib-in sinus, continue Coreg, Eliquis on hold  pending potential surgery next week  Chronic combined systolic and diastolic CHF-euvolemic, continue Coreg, furosemide  Type 2 diabetes mellitus, with hyperglycemia-A1c 7.0.  Continue long-acting and mealtime insulin.  CBGs with good control as below  CBG (last 3)  Recent Labs    09/04/21 1651 09/04/21 2023 09/05/21 0532  GLUCAP 153* 129* 124*     Scheduled Meds:  aspirin EC  81 mg Oral Daily   benzonatate  200 mg Oral TID   carvedilol  25 mg Oral BID WC   docusate sodium  100 mg Oral BID   furosemide  20 mg Oral Daily   guaiFENesin  1,200 mg Oral BID   insulin aspart  0-15 Units Subcutaneous TID WC   insulin aspart  3 Units Subcutaneous TID WC   insulin glargine-yfgn  25 Units Subcutaneous q morning   mometasone-formoterol  2 puff Inhalation BID   mupirocin ointment  1 application Nasal BID   pantoprazole  40 mg Oral BID AC   rosuvastatin  20 mg Oral Daily   sertraline  25 mg Oral Daily   sodium chloride flush  3 mL Intravenous Q12H   sodium chloride flush  3 mL Intravenous Q12H   Continuous Infusions:  sodium chloride     sodium chloride     sodium chloride 1 mL/kg/hr (09/05/21 0601)   ceFEPime (MAXIPIME) IV 2 g (09/05/21 0558)   metronidazole 500 mg (09/05/21 0254)   vancomycin 1,000 mg (09/04/21 2335)   PRN Meds:.sodium chloride, sodium chloride, acetaminophen, albuterol, hydrALAZINE, labetalol, LORazepam, ondansetron (ZOFRAN) IV, ondansetron **OR** [DISCONTINUED] ondansetron (ZOFRAN) IV, sodium chloride flush, sodium chloride flush  Diet Orders (From admission, onward)     Start     Ordered   09/05/21 0001  Diet NPO time specified Except for: Sips with Meds  Diet effective midnight  Comments: NPO for solid foods after midnight, may have clear liquids until 5am, then NPO (this would be for inpatients and outpatients)  Question:  Except for  Answer:  Sips with Meds   09/03/21 1711            DVT prophylaxis: SCD's Start: 09/02/21 1339     Code  Status: Full Code  Family Communication: Wife at bedside  Status is: Inpatient  Remains inpatient appropriate because: Pending vascular intervention   Level of care: Med-Surg  Consultants:  none  Procedures:  none  Microbiology  Negative so far  Antimicrobials: Vancomycin / Zosyn    Objective: Vitals:   09/04/21 2311 09/05/21 0342 09/05/21 0812 09/05/21 0833  BP: 134/67 136/67 137/63   Pulse: 74  67   Resp: 17 18 18    Temp: 97.8 F (36.6 C) 97.7 F (36.5 C) 97.6 F (36.4 C)   TempSrc: Oral Oral Oral   SpO2: 94% 98% 100% 98%  Weight:      Height:        Intake/Output Summary (Last 24 hours) at 09/05/2021 1050 Last data filed at 09/05/2021 0300 Gross per 24 hour  Intake 480 ml  Output 3550 ml  Net -3070 ml    Filed Weights   08/30/21 2121  Weight: 98.1 kg    Examination:  Constitutional: NAD Eyes: No scleral icterus ENMT: mmm Neck: normal, supple Respiratory: Clear bilaterally, no wheezing Cardiovascular: Regular rate and rhythm, no murmurs, no edema Abdomen: Soft, NT, ND, bowel sounds positive Musculoskeletal: no clubbing / cyanosis.  Skin: No rashes seen Neurologic: No focal deficits  Data Reviewed: I have independently reviewed following labs and imaging studies   CBC: Recent Labs  Lab 08/30/21 1540 08/31/21 0313 09/01/21 0332 09/03/21 0158 09/05/21 0157  WBC 16.1* 13.8* 11.2* 11.8* 11.9*  NEUTROABS 13.0*  --   --   --   --   HGB 10.8* 9.8* 9.7* 9.5* 8.6*  HCT 34.3* 30.4* 30.2* 29.7* 27.0*  MCV 98.0 95.6 96.2 96.4 95.7  PLT 241 196 215 243 161    Basic Metabolic Panel: Recent Labs  Lab 08/31/21 0313 09/01/21 0332 09/03/21 0158 09/04/21 0149 09/05/21 0157  NA 141 137 132* 135 133*  K 3.2* 4.0 3.6 3.4* 3.8  CL 112* 107 106 109 106  CO2 23 22 22  21* 19*  GLUCOSE 106* 180* 173* 134* 163*  BUN 28* 27* 21 20 21   CREATININE 1.23 1.36* 1.10 1.31* 1.32*  CALCIUM 8.1* 8.2* 8.2* 8.1* 7.9*    Liver Function Tests: Recent Labs   Lab 08/30/21 1213 09/03/21 0158  AST 44* 20  ALT 18 17  ALKPHOS 58 43  BILITOT 0.9 0.9  PROT 6.5 5.7*  ALBUMIN 3.2* 2.3*    Coagulation Profile: No results for input(s): INR, PROTIME in the last 168 hours. HbA1C: No results for input(s): HGBA1C in the last 72 hours.  CBG: Recent Labs  Lab 09/04/21 0631 09/04/21 1120 09/04/21 1651 09/04/21 2023 09/05/21 0532  GLUCAP 139* 217* 153* 129* 124*     Recent Results (from the past 240 hour(s))  Resp Panel by RT-PCR (Flu A&B, Covid) Nasopharyngeal Swab     Status: None   Collection Time: 08/30/21 11:47 AM   Specimen: Nasopharyngeal Swab; Nasopharyngeal(NP) swabs in vial transport medium  Result Value Ref Range Status   SARS Coronavirus 2 by RT PCR NEGATIVE NEGATIVE Final    Comment: (NOTE) SARS-CoV-2 target nucleic acids are NOT DETECTED.  The SARS-CoV-2 RNA is generally detectable  in upper respiratory specimens during the acute phase of infection. The lowest concentration of SARS-CoV-2 viral copies this assay can detect is 138 copies/mL. A negative result does not preclude SARS-Cov-2 infection and should not be used as the sole basis for treatment or other patient management decisions. A negative result may occur with  improper specimen collection/handling, submission of specimen other than nasopharyngeal swab, presence of viral mutation(s) within the areas targeted by this assay, and inadequate number of viral copies(<138 copies/mL). A negative result must be combined with clinical observations, patient history, and epidemiological information. The expected result is Negative.  Fact Sheet for Patients:  EntrepreneurPulse.com.au  Fact Sheet for Healthcare Providers:  IncredibleEmployment.be  This test is no t yet approved or cleared by the [place] FDA and  has been authorized for detection and/or diagnosis of SARS-CoV-2 by FDA under an Emergency Use Authorization (EUA). This  EUA will remain  in effect (meaning this test can be used) for the duration of the COVID-19 declaration under Section 564(b)(1) of the Act, 21 U.S.C.section 360bbb-3(b)(1), unless the authorization is terminated  or revoked sooner.       Influenza A by PCR NEGATIVE NEGATIVE Final   Influenza B by PCR NEGATIVE NEGATIVE Final    Comment: (NOTE) The Xpert Xpress SARS-CoV-2/FLU/RSV plus assay is intended as an aid in the diagnosis of influenza from Nasopharyngeal swab specimens and should not be used as a sole basis for treatment. Nasal washings and aspirates are unacceptable for Xpert Xpress SARS-CoV-2/FLU/RSV testing.  Fact Sheet for Patients: EntrepreneurPulse.com.au  Fact Sheet for Healthcare Providers: IncredibleEmployment.be  This test is not yet approved or cleared by the [place] FDA and has been authorized for detection and/or diagnosis of SARS-CoV-2 by FDA under an Emergency Use Authorization (EUA). This EUA will remain in effect (meaning this test can be used) for the duration of the COVID-19 declaration under Section 564(b)(1) of the Act, 21 U.S.C. section 360bbb-3(b)(1), unless the authorization is terminated or revoked.  Performed at [facility], [place] [address], [place], [place] [address]   Blood culture (routine x 2)     Status: None   Collection Time: 08/30/21  4:34 PM   Specimen: BLOOD  Result Value Ref Range Status   Specimen Description BLOOD RIGHT ANTECUBITAL  Final   Special Requests   Final    BOTTLES DRAWN AEROBIC AND ANAEROBIC Blood Culture adequate volume   Culture   Final    NO GROWTH 5 DAYS Performed at [facility], [place] [address] [place], [place] [address]    Report Status 09/04/2021 FINAL  Final  Blood culture (routine x 2)     Status: None   Collection Time: 08/30/21  4:36 PM   Specimen: BLOOD  Result Value Ref Range Status   Specimen Description BLOOD LEFT ANTECUBITAL  Final   Special  Requests   Final    BOTTLES DRAWN AEROBIC AND ANAEROBIC Blood Culture adequate volume   Culture   Final    NO GROWTH 5 DAYS Performed at [facility], [place] [address], [place], [place] [address]    Report Status 09/04/2021 FINAL  Final  MRSA Next Gen by PCR, Nasal     Status: Abnormal   Collection Time: 08/31/21  6:01 AM   Specimen: Nasal Mucosa; Nasal Swab  Result Value Ref Range Status   MRSA by PCR Next Gen DETECTED (A) NOT DETECTED Final    Comment: RESULT CALLED TO, READ BACK BY AND VERIFIED  WITH: Earl Lites RN, AT [phone] 08/31/21 D. VANHOOK (NOTE) The GeneXpert MRSA Assay (FDA approved for NASAL specimens only), is one component of a comprehensive MRSA colonization surveillance program. It is not intended to diagnose MRSA infection nor to guide or monitor treatment for MRSA infections. Test performance is not FDA approved in patients less than 70 years old. Performed at [facility], [place] [address] [place], [place] [address]       Radiology Studies: No results found.   Marzetta Board, MD, PhD [facility]  Between 7 am - 7 pm I am available, please contact me via Amion (for emergencies) or Securechat (non urgent messages)  Between 7 pm - 7 am I am not available, please contact night coverage MD/APP via Amion

## 2021-09-05 NOTE — Progress Notes (Signed)
PT Cancellation Note  Patient Details Name: DAVIONNE DOWTY MRN: [id number] DOB: 1956-07-27   Cancelled Treatment:    Reason Eval/Treat Not Completed: Patient at procedure or test/unavailable. Pt off floor for heart cath.   Lorriane Shire 09/05/2021, 10:53 AM  Lorrin Goodell, PT  Office # [phone] Pager [phone]

## 2021-09-05 NOTE — Care Management Important Message (Signed)
Important Message  Patient Details  Name: CHRISTROPHER GINTZ MRN: [id number] Date of Birth: 11/06/1955   Medicare Important Message Given:  Yes     Shelda Altes 09/05/2021, 10:21 AM

## 2021-09-05 NOTE — Interval H&P Note (Signed)
Cath Lab Visit (complete for each Cath Lab visit)  Clinical Evaluation Leading to the Procedure:   ACS: No.  Non-ACS:    Anginal Classification: No Symptoms  Anti-ischemic medical therapy: No Therapy  Non-Invasive Test Results: No non-invasive testing performed  Prior CABG: No previous CABG      History and Physical Interval Note:  09/05/2021 10:54 AM  Brian Martin  has presented today for surgery, with the diagnosis of unstable angina.  The various methods of treatment have been discussed with the patient and family. After consideration of risks, benefits and other options for treatment, the patient has consented to  Procedure(s): RIGHT/LEFT HEART CATH AND CORONARY ANGIOGRAPHY (N/A) as a surgical intervention.  The patient's history has been reviewed, patient examined, no change in status, stable for surgery.  I have reviewed the patient's chart and labs.  Questions were answered to the patient's satisfaction.     Quay Burow

## 2021-09-05 NOTE — Progress Notes (Signed)
Progress Note  Patient Name: Brian Martin Date of Encounter: 09/05/2021  [facility] Cardiologist:  Marlou Porch   Subjective   No chest pain or dyspnea this am for cath   Inpatient Medications    Scheduled Meds:  aspirin EC  81 mg Oral Daily   benzonatate  200 mg Oral TID   carvedilol  25 mg Oral BID WC   docusate sodium  100 mg Oral BID   furosemide  20 mg Oral Daily   guaiFENesin  1,200 mg Oral BID   insulin aspart  0-15 Units Subcutaneous TID WC   insulin aspart  3 Units Subcutaneous TID WC   insulin glargine-yfgn  25 Units Subcutaneous q morning   mometasone-formoterol  2 puff Inhalation BID   mupirocin ointment  1 application Nasal BID   pantoprazole  40 mg Oral BID AC   rosuvastatin  20 mg Oral Daily   sertraline  25 mg Oral Daily   sodium chloride flush  3 mL Intravenous Q12H   sodium chloride flush  3 mL Intravenous Q12H   Continuous Infusions:  sodium chloride     sodium chloride     sodium chloride 1 mL/kg/hr (09/05/21 0601)   ceFEPime (MAXIPIME) IV 2 g (09/05/21 0558)   metronidazole 500 mg (09/05/21 0254)   vancomycin 1,000 mg (09/04/21 2335)   PRN Meds: sodium chloride, sodium chloride, acetaminophen, albuterol, hydrALAZINE, labetalol, LORazepam, ondansetron (ZOFRAN) IV, ondansetron **OR** [DISCONTINUED] ondansetron (ZOFRAN) IV, sodium chloride flush, sodium chloride flush   Vital Signs    Vitals:   09/04/21 1929 09/04/21 2311 09/05/21 0342 09/05/21 0812  BP: 128/70 134/67 136/67 137/63  Pulse: 76 74  67  Resp: 19 17 18 18   Temp: 98.3 F (36.8 C) 97.8 F (36.6 C) 97.7 F (36.5 C) 97.6 F (36.4 C)  TempSrc: Oral Oral Oral Oral  SpO2: 100% 94% 98% 100%  Weight:      Height:        Intake/Output Summary (Last 24 hours) at 09/05/2021 0833 Last data filed at 09/05/2021 0300 Gross per 24 hour  Intake 720 ml  Output 3550 ml  Net -2830 ml   Last 3 Weights 08/30/2021 12/02/2018 11/21/2017  Weight (lbs) 216 lb 4.3 oz 225 lb 231 lb  Weight (kg) 98.1 kg  102.059 kg 104.781 kg      Telemetry  NSR rare PVC;s 09/05/2021   ECG    Sinus rhythm left axis deviation- Personally Reviewed  Physical Exam   Chronically ill pale male Lungs clear No murmur  Post left BKA PVD RLE with decreased pulse DP marked  No edema  Neuro non focal   Labs    High Sensitivity Troponin:   Recent Labs  Lab 08/30/21 1213 08/30/21 1440  TROPONINIHS 23* 22*     Chemistry Recent Labs  Lab 08/30/21 1213 08/31/21 0313 09/03/21 0158 09/04/21 0149 09/05/21 0157  NA 139   < > 132* 135 133*  K 4.0   < > 3.6 3.4* 3.8  CL 106   < > 106 109 106  CO2 23   < > 22 21* 19*  GLUCOSE 252*   < > 173* 134* 163*  BUN 31*   < > 21 20 21   CREATININE 1.44*   < > 1.10 1.31* 1.32*  CALCIUM 8.5*   < > 8.2* 8.1* 7.9*  PROT 6.5  --  5.7*  --   --   ALBUMIN 3.2*  --  2.3*  --   --  AST 44*  --  20  --   --   ALT 18  --  17  --   --   ALKPHOS 58  --  43  --   --   BILITOT 0.9  --  0.9  --   --   GFRNONAA 54*   < > >60 >60 59*  ANIONGAP 10   < > 4* 5 8   < > = values in this interval not displayed.    Lipids No results for input(s): CHOL, TRIG, HDL, LABVLDL, LDLCALC, CHOLHDL in the last 168 hours.  Hematology Recent Labs  Lab 09/01/21 0332 09/03/21 0158 09/05/21 0157  WBC 11.2* 11.8* 11.9*  RBC 3.14* 3.08* 2.82*  HGB 9.7* 9.5* 8.6*  HCT 30.2* 29.7* 27.0*  MCV 96.2 96.4 95.7  MCH 30.9 30.8 30.5  MCHC 32.1 32.0 31.9  RDW 13.1 12.7 12.7  PLT 215 243 257   Thyroid  Recent Labs  Lab 08/30/21 1213  TSH 1.194    BNP Recent Labs  Lab 08/30/21 1213  BNP 117.2*    DDimer No results for input(s): DDIMER in the last 168 hours.   Radiology    VAS [place] LOWER EXTREMITY SAPHENOUS VEIN MAPPING  Result Date: 09/04/2021 LOWER EXTREMITY VEIN MAPPING Patient Name:  Brian Martin  Date of Exam:   09/03/2021 Medical Rec #: [id number]        Accession #:    [id number] Date of Birth: 15-Apr-1956        Patient Gender: M Patient Age:   66 years Exam Location:  [facility] Procedure:      VAS [place] LOWER EXTREMITY SAPHENOUS VEIN MAPPING Referring Phys: Harold Barban --------------------------------------------------------------------------------  Indications:        Pre-op Other Indications:  Right ulceration History:            History of PAD; patient is pre-operative for lower extremity                     bypass graft. Other Risk Factors: History of left BKA.  Comparison Study: No prior study Performing Technologist: Sharion Dove RVS  Examination Guidelines: A complete evaluation includes B-mode imaging, spectral Doppler, color Doppler, and power Doppler as needed of all accessible portions of each vessel. Bilateral testing is considered an integral part of a complete examination. Limited examinations for reoccurring indications may be performed as noted. +---------------+-----------+----------------------+---------------+-----------+    RT Diameter   RT Findings          GSV             LT Diameter   LT Findings        (cm)                                               (cm)                    +---------------+-----------+----------------------+---------------+-----------+       0.67                       Saphenofemoral  Junction                                     +---------------+-----------+----------------------+---------------+-----------+       0.55                       Proximal thigh                                  +---------------+-----------+----------------------+---------------+-----------+       0.52        branching        Mid thigh                                     +---------------+-----------+----------------------+---------------+-----------+       0.63        branching       Distal thigh                                   +---------------+-----------+----------------------+---------------+-----------+       0.57                            Knee                                        +---------------+-----------+----------------------+---------------+-----------+       0.33                         Prox calf                                     +---------------+-----------+----------------------+---------------+-----------+       0.35        branching         Mid calf                                     +---------------+-----------+----------------------+---------------+-----------+       0.25        branching       Distal calf                                    +---------------+-----------+----------------------+---------------+-----------+       0.19        branching          Ankle                                       +---------------+-----------+----------------------+---------------+-----------+ Diagnosing physician: Orlie Pollen Electronically signed by Orlie Pollen on 09/04/2021 at 2:33:17 PM.    Final    ECHOCARDIOGRAM COMPLETE  Result Date: 09/03/2021    ECHOCARDIOGRAM REPORT   Patient Name:   Brian Martin Date of Exam: 09/03/2021 Medical Rec #:  [id number]       Height:       73.0 in Accession #:    [id number]      Weight:       216.3 lb Date of Birth:  [birth year]       BSA:          2.224 m Patient Age:    35 years        BP:           135/78 mmHg Patient Gender: M               HR:           81 bpm. Exam Location:  Inpatient Procedure: 2D Echo, Cardiac Doppler and Color Doppler Indications:    CHF  History:        Patient has prior history of Echocardiogram examinations, most                 recent 05/05/2015. Arrythmias:Atrial Fibrillation; Risk                 Factors:Diabetes, Hypertension and Dyslipidemia. CKD.  Sonographer:    Clayton Lefort RDCS (AE) Referring Phys: [id number] [place]  1. Left ventricular ejection fraction, by estimation, is 55 to 60%. The left ventricle has normal function. The left ventricle has no regional wall motion abnormalities. Left ventricular diastolic parameters were normal.  2. Right ventricular systolic function is normal. The right  ventricular size is normal.  3. Left atrial size was mildly dilated.  4. The mitral valve is normal in structure. No evidence of mitral valve regurgitation. No evidence of mitral stenosis.  5. The aortic valve is normal in structure. Aortic valve regurgitation is not visualized. No aortic stenosis is present.  6. The inferior vena cava is dilated in size with <50% respiratory variability, suggesting right atrial pressure of 15 mmHg. FINDINGS  Left Ventricle: Left ventricular ejection fraction, by estimation, is 55 to 60%. The left ventricle has normal function. The left ventricle has no regional wall motion abnormalities. The left ventricular internal cavity size was normal in size. There is  no left ventricular hypertrophy. Left ventricular diastolic parameters were normal. Right Ventricle: The right ventricular size is normal. No increase in right ventricular wall thickness. Right ventricular systolic function is normal. Left Atrium: Left atrial size was mildly dilated. Right Atrium: Right atrial size was normal in size. Pericardium: There is no evidence of pericardial effusion. Mitral Valve: The mitral valve is normal in structure. Mild mitral annular calcification. No evidence of mitral valve regurgitation. No evidence of mitral valve stenosis. Tricuspid Valve: The tricuspid valve is normal in structure. Tricuspid valve regurgitation is not demonstrated. No evidence of tricuspid stenosis. Aortic Valve: The aortic valve is normal in structure. Aortic valve regurgitation is not visualized. No aortic stenosis is present. Aortic valve mean gradient measures 2.0 mmHg. Aortic valve peak gradient measures 4.5 mmHg. Aortic valve area, by VTI measures 4.13 cm. Pulmonic Valve: The pulmonic valve was normal in structure. Pulmonic valve regurgitation is not visualized. No evidence of pulmonic stenosis. Aorta: The aortic root is normal in size and structure. Venous: The inferior vena cava is dilated in size with less than  50% respiratory variability, suggesting right atrial pressure of 15 mmHg. IAS/Shunts: No atrial level shunt detected by color flow Doppler.  LEFT VENTRICLE PLAX 2D LVIDd:         5.20 cm LVIDs:         3.70 cm  LV PW:         1.30 cm LV IVS:        0.90 cm LVOT diam:     2.50 cm LV SV:         84 LV SV Index:   38 LVOT Area:     4.91 cm  RIGHT VENTRICLE             IVC RV Basal diam:  2.90 cm     IVC diam: 2.10 cm RV S prime:     13.80 cm/s TAPSE (M-mode): 1.6 cm LEFT ATRIUM             Index        RIGHT ATRIUM           Index LA diam:        4.30 cm 1.93 cm/m   RA Area:     13.70 cm LA Vol (A2C):   87.9 ml 39.52 ml/m  RA Volume:   28.30 ml  12.72 ml/m LA Vol (A4C):   82.8 ml 37.22 ml/m LA Biplane Vol: 88.7 ml 39.88 ml/m  AORTIC VALVE AV Area (Vmax):    4.21 cm AV Area (Vmean):   4.13 cm AV Area (VTI):     4.13 cm AV Vmax:           106.00 cm/s AV Vmean:          71.600 cm/s AV VTI:            0.203 m AV Peak Grad:      4.5 mmHg AV Mean Grad:      2.0 mmHg LVOT Vmax:         91.00 cm/s LVOT Vmean:        60.200 cm/s LVOT VTI:          0.171 m LVOT/AV VTI ratio: 0.84  AORTA Ao Root diam: 3.50 cm  SHUNTS Systemic VTI:  0.17 m Systemic Diam: 2.50 cm Candee Furbish MD Electronically signed by Candee Furbish MD Signature Date/Time: 09/03/2021/4:29:16 PM    Final     Cardiac Studies   Prior CT of chest reviewed-coronary calcification noted  ECHO 09/03/21:   1. Left ventricular ejection fraction, by estimation, is 55 to 60%. The  left ventricle has normal function. The left ventricle has no regional  wall motion abnormalities. Left ventricular diastolic parameters were  normal.   2. Right ventricular systolic function is normal. The right ventricular  size is normal.   3. Left atrial size was mildly dilated.   4. The mitral valve is normal in structure. No evidence of mitral valve  regurgitation. No evidence of mitral stenosis.   5. The aortic valve is normal in structure. Aortic valve regurgitation is   not visualized. No aortic stenosis is present.   6. The inferior vena cava is dilated in size with <50% respiratory  variability, suggesting right atrial pressure of 15 mmHg.   Patient Profile     66 y.o. male with severe peripheral vascular disease awaiting right lower extremity arterial bypass with prior left BKA.  Recent chest CT demonstrates quite significant coronary artery calcification.  Assessment & Plan    Preoperative risk assessment - Dr Marlou Porch felt with degree of coronary calcification , high risk vascular surgery and poor functional status < 5 Mets needs cath. Right heart with simultaneous RV/LV due to pericardial calcification also requested although no evidence of constriction on echo or volume overload   Coronary artery calcification -  Left main RCA LAD noted on chest CT. - Started aspirin 81 mg  Hyperlipidemia - Now on crestor 20 mg daily   Mildly elevated troponin - Flat, demand ischemia in the setting of his underlying vascular disease.  Chronic kidney disease stage IIIa - Creatinine 1.32 this morning this is baseline.  Hydrate overnight for heart catheterization.  Osteomyelitis - IV cefepime, metronidazole, vancomycin.  Stable  Hopefully will not have any obstructive CAD so PV surgery can be done for limb salvage       Signed, Jenkins Rouge, MD  09/05/2021, 8:33 AM

## 2021-09-06 DIAGNOSIS — M8660 Other chronic osteomyelitis, unspecified site: Secondary | ICD-10-CM | POA: Diagnosis not present

## 2021-09-06 DIAGNOSIS — Z01818 Encounter for other preprocedural examination: Secondary | ICD-10-CM

## 2021-09-06 LAB — CBC
HCT: 28.9 % — ABNORMAL LOW (ref 39.0–52.0)
Hemoglobin: 9.3 g/dL — ABNORMAL LOW (ref 13.0–17.0)
MCH: 30.9 pg (ref 26.0–34.0)
MCHC: 32.2 g/dL (ref 30.0–36.0)
MCV: 96 fL (ref 80.0–100.0)
Platelets: 287 10*3/uL (ref 150–400)
RBC: 3.01 MIL/uL — ABNORMAL LOW (ref 4.22–5.81)
RDW: 12.9 % (ref 11.5–15.5)
WBC: 9.9 10*3/uL (ref 4.0–10.5)
nRBC: 0 % (ref 0.0–0.2)

## 2021-09-06 LAB — GLUCOSE, CAPILLARY
Glucose-Capillary: 156 mg/dL — ABNORMAL HIGH (ref 70–99)
Glucose-Capillary: 203 mg/dL — ABNORMAL HIGH (ref 70–99)
Glucose-Capillary: 144 mg/dL — ABNORMAL HIGH (ref 70–99)
Glucose-Capillary: 139 mg/dL — ABNORMAL HIGH (ref 70–99)

## 2021-09-06 LAB — BASIC METABOLIC PANEL
Anion gap: 8 (ref 5–15)
BUN: 21 mg/dL (ref 8–23)
CO2: 17 mmol/L — ABNORMAL LOW (ref 22–32)
Calcium: 7.9 mg/dL — ABNORMAL LOW (ref 8.9–10.3)
Chloride: 111 mmol/L (ref 98–111)
Creatinine, Ser: 1.19 mg/dL (ref 0.61–1.24)
GFR, Estimated: >60 mL/min (ref >60)
Glucose, Bld: 175 mg/dL — ABNORMAL HIGH (ref 70–99)
Potassium: 3.9 mmol/L (ref 3.5–5.1)
Sodium: 136 mmol/L (ref 135–145)

## 2021-09-06 NOTE — H&P (View-Only) (Signed)
Subjective  -  No complaints   Physical Exam:  Right leg ulcer Palpable femoral pulse Nonlabored breathing      Assessment/Plan:   Right leg ulcer: The patient has undergone cardiac catheterization and has been found to be low risk for surgery.  I discussed with the patient and his wife that his best option for limb salvage would be a right femoral to above-knee popliteal artery bypass graft with vein.  I discussed the details of the operation including the risks and benefits.  After weighing his options, he would like to proceed with surgery.  A spot has opened on the operating room for tomorrow.  I have communicated with him that we will proceed tomorrow for his bypass.  Brian Martin 09/06/2021 9:11 PM --  Vitals:   09/06/21 1933 09/06/21 2014  BP: 131/66   Pulse: 74   Resp: 20   Temp: (!) 97.4 F (36.3 C)   SpO2: 98% 98%    Intake/Output Summary (Last 24 hours) at 09/06/2021 2111 Last data filed at 09/06/2021 1836 Gross per 24 hour  Intake 3280 ml  Output 2675 ml  Net 605 ml     Laboratory CBC    Component Value Date/Time   WBC 9.9 09/06/2021 0338   HGB 9.3 (L) 09/06/2021 0338   HCT 28.9 (L) 09/06/2021 0338   PLT 287 09/06/2021 0338    BMET    Component Value Date/Time   NA 136 09/06/2021 0338   K 3.9 09/06/2021 0338   CL 111 09/06/2021 0338   CO2 17 (L) 09/06/2021 0338   GLUCOSE 175 (H) 09/06/2021 0338   BUN 21 09/06/2021 0338   CREATININE 1.19 09/06/2021 0338   CALCIUM 7.9 (L) 09/06/2021 0338   GFRNONAA >60 09/06/2021 0338   GFRAA >60 11/21/2017 1001    COAG Lab Results  Component Value Date   INR 1.3 (H) 04/08/2021   INR 2.8 04/22/2020   INR 3.0 04/01/2020   No results found for: PTT  Antibiotics Anti-infectives (From admission, onward)    Start     Dose/Rate Route Frequency Ordered Stop   09/05/21 0000  vancomycin (VANCOCIN) IVPB 1000 mg/200 mL premix        1,000 mg 200 mL/hr over 60 Minutes Intravenous Every 12 hours  09/04/21 1331     09/02/21 1515  metroNIDAZOLE (FLAGYL) IVPB 500 mg        500 mg 100 mL/hr over 60 Minutes Intravenous Every 12 hours 09/02/21 1350     09/02/21 1445  ceFEPIme (MAXIPIME) 2 g in sodium chloride 0.9 % 100 mL IVPB        2 g 200 mL/hr over 30 Minutes Intravenous Every 8 hours 09/02/21 1350     09/01/21 0800  vancomycin (VANCOREADY) IVPB 1750 mg/350 mL  Status:  Discontinued        1,750 mg 175 mL/hr over 120 Minutes Intravenous Every 24 hours 08/31/21 1332 09/04/21 1331   08/31/21 0800  vancomycin (VANCOREADY) IVPB 1750 mg/350 mL  Status:  Discontinued        1,750 mg 175 mL/hr over 120 Minutes Intravenous Every 12 hours 08/30/21 1750 08/31/21 1332   08/30/21 1745  vancomycin (VANCOREADY) IVPB 2000 mg/400 mL        2,000 mg 200 mL/hr over 120 Minutes Intravenous  Once 08/30/21 1738 08/30/21 2344   08/30/21 1745  piperacillin-tazobactam (ZOSYN) IVPB 3.375 g  Status:  Discontinued        3.375 g 12.5 mL/hr over  240 Minutes Intravenous Every 8 hours 08/30/21 1738 09/02/21 1350   08/30/21 1615  ceFAZolin (ANCEF) IVPB 1 g/50 mL premix  Status:  Discontinued        1 g 100 mL/hr over 30 Minutes Intravenous Every 8 hours 08/30/21 1607 08/30/21 1718   08/30/21 1130  cephALEXin (KEFLEX) capsule 500 mg        500 mg Oral  Once 08/30/21 1115 08/30/21 1148        V. Leia Alf, M.D., [facility] [place] Office: [phone] Pager:  [phone]

## 2021-09-06 NOTE — Progress Notes (Signed)
Consent form obtained and placed in pt's chart.   Lavenia Atlas, RN

## 2021-09-06 NOTE — Progress Notes (Signed)
Subjective  -  No complaints   Physical Exam:  Right leg ulcer Palpable femoral pulse Nonlabored breathing      Assessment/Plan:   Right leg ulcer: The patient has undergone cardiac catheterization and has been found to be low risk for surgery.  I discussed with the patient and his wife that his best option for limb salvage would be a right femoral to above-knee popliteal artery bypass graft with vein.  I discussed the details of the operation including the risks and benefits.  After weighing his options, he would like to proceed with surgery.  A spot has opened on the operating room for tomorrow.  I have communicated with him that we will proceed tomorrow for his bypass.  Wells Lynesha Bango 09/06/2021 9:11 PM --  Vitals:   09/06/21 1933 09/06/21 2014  BP: 131/66   Pulse: 74   Resp: 20   Temp: (!) 97.4 F (36.3 C)   SpO2: 98% 98%    Intake/Output Summary (Last 24 hours) at 09/06/2021 2111 Last data filed at 09/06/2021 1836 Gross per 24 hour  Intake 3280 ml  Output 2675 ml  Net 605 ml     Laboratory CBC    Component Value Date/Time   WBC 9.9 09/06/2021 0338   HGB 9.3 (L) 09/06/2021 0338   HCT 28.9 (L) 09/06/2021 0338   PLT 287 09/06/2021 0338    BMET    Component Value Date/Time   NA 136 09/06/2021 0338   K 3.9 09/06/2021 0338   CL 111 09/06/2021 0338   CO2 17 (L) 09/06/2021 0338   GLUCOSE 175 (H) 09/06/2021 0338   BUN 21 09/06/2021 0338   CREATININE 1.19 09/06/2021 0338   CALCIUM 7.9 (L) 09/06/2021 0338   GFRNONAA >60 09/06/2021 0338   GFRAA >60 11/21/2017 1001    COAG Lab Results  Component Value Date   INR 1.3 (H) 04/08/2021   INR 2.8 04/22/2020   INR 3.0 04/01/2020   No results found for: PTT  Antibiotics Anti-infectives (From admission, onward)    Start     Dose/Rate Route Frequency Ordered Stop   09/05/21 0000  vancomycin (VANCOCIN) IVPB 1000 mg/200 mL premix        1,000 mg 200 mL/hr over 60 Minutes Intravenous Every 12 hours  09/04/21 1331     09/02/21 1515  metroNIDAZOLE (FLAGYL) IVPB 500 mg        500 mg 100 mL/hr over 60 Minutes Intravenous Every 12 hours 09/02/21 1350     09/02/21 1445  ceFEPIme (MAXIPIME) 2 g in sodium chloride 0.9 % 100 mL IVPB        2 g 200 mL/hr over 30 Minutes Intravenous Every 8 hours 09/02/21 1350     09/01/21 0800  vancomycin (VANCOREADY) IVPB 1750 mg/350 mL  Status:  Discontinued        1,750 mg 175 mL/hr over 120 Minutes Intravenous Every 24 hours 08/31/21 1332 09/04/21 1331   08/31/21 0800  vancomycin (VANCOREADY) IVPB 1750 mg/350 mL  Status:  Discontinued        1,750 mg 175 mL/hr over 120 Minutes Intravenous Every 12 hours 08/30/21 1750 08/31/21 1332   08/30/21 1745  vancomycin (VANCOREADY) IVPB 2000 mg/400 mL        2,000 mg 200 mL/hr over 120 Minutes Intravenous  Once 08/30/21 1738 08/30/21 2344   08/30/21 1745  piperacillin-tazobactam (ZOSYN) IVPB 3.375 g  Status:  Discontinued        3.375 g 12.5 mL/hr over  240 Minutes Intravenous Every 8 hours 08/30/21 1738 09/02/21 1350   08/30/21 1615  ceFAZolin (ANCEF) IVPB 1 g/50 mL premix  Status:  Discontinued        1 g 100 mL/hr over 30 Minutes Intravenous Every 8 hours 08/30/21 1607 08/30/21 1718   08/30/21 1130  cephALEXin (KEFLEX) capsule 500 mg        500 mg Oral  Once 08/30/21 1115 08/30/21 1148        V. Leia Alf, M.D., [facility] [place] Office: [phone] Pager:  [phone]

## 2021-09-06 NOTE — Progress Notes (Signed)
Physical Therapy Treatment Patient Details Name: Brian Martin MRN: [id number] DOB: 1955-11-13 Today's Date: 09/06/2021   History of Present Illness Pt is a 66 y/o M presenting from skilled nursing facility s/p fall with found infection from R foot ulcer and cognitive decline. Pt scheduled for vascular intervention of RLE 1/11.  PMH includes L BKA, CHF, PAF, IDDM, and HTN.    PT Comments    Pt able to get OOB to recliner performing lateral scoot transfer with assist of 2 people. Family present and will try to bring pt's prosthetic for his LLE in the next several days. Prosthetic likely would be helpful for transfers. Pt to have vascular intervention on RLE tomorrow.    Recommendations for follow up therapy are one component of a multi-disciplinary discharge planning process, led by the attending physician.  Recommendations may be updated based on patient status, additional functional criteria and insurance authorization.  Follow Up Recommendations  Skilled nursing-short term rehab (<3 hours/day)     Assistance Recommended at Discharge Frequent or constant Supervision/Assistance  Patient can return home with the following     Equipment Recommendations  None recommended by PT    Recommendations for Other Services       Precautions / Restrictions Precautions Precautions: Fall Precaution Comments: L BKA     Mobility  Bed Mobility Overal bed mobility: Needs Assistance Bed Mobility: Sidelying to Sit   Sidelying to sit: Mod assist       General bed mobility comments: Assist to elevate trunk into sitting.    Transfers Overall transfer level: Needs assistance Equipment used: None Transfers: Bed to chair/wheelchair/BSC            Lateral/Scoot Transfers: +2 physical assistance;Mod assist General transfer comment: Used bed pad to assist pt scooting bed to drop arm recliner.    Ambulation/Gait                   Stairs             Wheelchair Mobility     Modified Rankin (Stroke Patients Only)       Balance Overall balance assessment: Needs assistance Sitting-balance support: No upper extremity supported;Feet unsupported Sitting balance-Leahy Scale: Fair                                      Cognition Arousal/Alertness: Awake/alert Behavior During Therapy: Flat affect Overall Cognitive Status: Within Functional Limits for tasks assessed                                          Exercises      General Comments        Pertinent Vitals/Pain Pain Assessment: No/denies pain    Home Living                          Prior Function            PT Goals (current goals can now be found in the care plan section) Acute Rehab PT Goals Patient Stated Goal: Pt eventually wants go to daughters home PT Goal Formulation: With patient Time For Goal Achievement: 09/20/21 Potential to Achieve Goals: Fair Progress towards PT goals: Progressing toward goals    Frequency    Min 2X/week  PT Plan Current plan remains appropriate    Co-evaluation              AM-PAC PT "6 Clicks" Mobility   Outcome Measure  Help needed turning from your back to your side while in a flat bed without using bedrails?: A Little Help needed moving from lying on your back to sitting on the side of a flat bed without using bedrails?: A Lot Help needed moving to and from a bed to a chair (including a wheelchair)?: Total Help needed standing up from a chair using your arms (e.g., wheelchair or bedside chair)?: Total Help needed to walk in hospital room?: Total Help needed climbing 3-5 steps with a railing? : Total 6 Click Score: 9    End of Session   Activity Tolerance: Patient tolerated treatment well Patient left: in chair;with call bell/phone within reach;with chair alarm set;with family/visitor present Nurse Communication: Mobility status;Need for lift equipment (nurse tech - can scoot back to  bed or use mechanical lift. Lift pad in place under pt) PT Visit Diagnosis: Unsteadiness on feet (R26.81);Other abnormalities of gait and mobility (R26.89);Repeated falls (R29.6);Muscle weakness (generalized) (M62.81);History of falling (Z91.81)     Time: 1157-2620 PT Time Calculation (min) (ACUTE ONLY): 24 min  Charges:  $Therapeutic Activity: 23-37 mins                     [place] Pager [phone] Office [place] 09/06/2021, 5:15 PM

## 2021-09-06 NOTE — Progress Notes (Signed)
PROGRESS NOTE  Brian Martin KDT:[id number] DOB: 14-Jul-1956 DOA: 08/30/2021 PCP: Marco Collie, MD   LOS: 7 days   Brief Narrative / Interim history: Brian Martin is a 66 y.o. male with medical history significant of nursing home resident for cognition declining, PVD status post left BKA, PAF on Eliquis, chronic diastolic CHF, IDDM, HTN, presented with frequent falls.  He also was complaining of right foot ulcer that was developed from November, completed a course of antibiotics but also remained persistent.  There is also reports about his cognition recently declining and was sent to the assisted living facility by family.  ABI during this admission showed poor flow to the right ankle, vascular surgery consulted and plans for femoropopliteal bypass later this week.  Cardiology consulted for preop clearance no complaints, no chest pain, no shortness of breath.  Subjective / 24h Interval events: No complaints this morning.  Has not been eaten anything, awaiting cath  Assessment & Plan: Principal Problem Right foot ulcer-there was initial concern for osteomyelitis, however MRI done on admission was negative for that.  ABIs obtained showed progression of his peripheral vascular disease on the right lower extremity.  Vascular surgery consulted, he underwent arteriogram 1/6 without endovascular revascularization options based on angiography, he is being considered for bypass surgery later this week, timing to be determined by vascular surgery.  Continue antibiotics, plan to narrow antibiotics following bypass surgery once blood flow to the ulcer is improved  Active Problems Peripheral vascular disease-status post left AKA.  Right-sided ABI as above, as above, potential bypass surgery later this week  CAD -CT scan with coronary artery calcification.  Given high risk vascular surgery, cardiology consulted for preop clearance.  He underwent cardiac catheterization 1/9, with no significant obstructive  disease.  He has been cleared for surgery by cardiology  Hypokalemia-continue to replete, monitor, stable this morning  Leukocytosis-due to foot ulcer infection-improving, continue antibiotics  Chronic kidney disease stage IIIa-Baseline creatinine 1.2-1.5, this morning he is at baseline  Essential hypertension-continue Coreg  Hyperlipidemia-continue statin  Depression-continue Zoloft  CKD 2-creatinine at baseline  Paroxysmal A. fib-in sinus, continue Coreg, Eliquis on hold pending potential surgery next week  Chronic combined systolic and diastolic CHF-euvolemic, continue Coreg, furosemide  Type 2 diabetes mellitus, with hyperglycemia-A1c 7.0.  Continue long-acting and mealtime insulin.  CBGs with good control as below  CBG (last 3)  Recent Labs    09/05/21 1602 09/05/21 2228 09/06/21 0611  GLUCAP 148* 172* 156*     Scheduled Meds:  aspirin EC  81 mg Oral Daily   benzonatate  200 mg Oral TID   carvedilol  25 mg Oral BID WC   docusate sodium  100 mg Oral BID   furosemide  20 mg Oral Daily   guaiFENesin  1,200 mg Oral BID   insulin aspart  0-15 Units Subcutaneous TID WC   insulin aspart  3 Units Subcutaneous TID WC   insulin glargine-yfgn  25 Units Subcutaneous q morning   mometasone-formoterol  2 puff Inhalation BID   mupirocin ointment  1 application Nasal BID   pantoprazole  40 mg Oral BID AC   rosuvastatin  20 mg Oral Daily   sertraline  25 mg Oral Daily   sodium chloride flush  3 mL Intravenous Q12H   sodium chloride flush  3 mL Intravenous Q12H   Continuous Infusions:  sodium chloride     sodium chloride     ceFEPime (MAXIPIME) IV 2 g (09/06/21 9983)   metronidazole Stopped (  09/06/21 0351)   vancomycin Stopped (09/06/21 0030)   PRN Meds:.sodium chloride, sodium chloride, acetaminophen, albuterol, labetalol, LORazepam, morphine injection, ondansetron (ZOFRAN) IV, sodium chloride flush, sodium chloride flush  Diet Orders (From admission, onward)      Start     Ordered   09/06/21 0808  Diet heart healthy/carb modified Room service appropriate? Yes; Fluid consistency: Thin  Diet effective now       Question Answer Comment  Diet-HS Snack? Nothing   Room service appropriate? Yes   Fluid consistency: Thin      09/06/21 0808            DVT prophylaxis: SCD's Start: 09/02/21 1339     Code Status: Full Code  Family Communication: Wife at bedside, daughter over the phone  Status is: Inpatient  Remains inpatient appropriate because: Pending vascular intervention   Level of care: Med-Surg  Consultants:  none  Procedures:  none  Microbiology  Negative so far  Antimicrobials: Vancomycin / Zosyn    Objective: Vitals:   09/05/21 2057 09/05/21 2305 09/06/21 0310 09/06/21 0747  BP:  134/64 115/60 (!) 142/66  Pulse:  78  69  Resp:  16 16 16   Temp:  98.8 F (37.1 C) 97.8 F (36.6 C) 97.9 F (36.6 C)  TempSrc:  Oral Axillary Oral  SpO2: 96% 97% 98% 97%  Weight:      Height:        Intake/Output Summary (Last 24 hours) at 09/06/2021 1102 Last data filed at 09/06/2021 0800 Gross per 24 hour  Intake 2851.32 ml  Output 1450 ml  Net 1401.32 ml    Filed Weights   08/30/21 2121  Weight: 98.1 kg    Examination:  Constitutional: NAD Eyes: Anicteric ENMT: Moist mucous membranes Neck: normal, supple Respiratory: CTA, no wheezing Cardiovascular: Regular rate and rhythm, no murmurs Abdomen: Soft, NT, ND, bowel sounds positive Musculoskeletal: no clubbing / cyanosis.  Skin: No rashes Neurologic: No focal deficits  Data Reviewed: I have independently reviewed following labs and imaging studies   CBC: Recent Labs  Lab 08/30/21 1540 08/31/21 0313 09/01/21 0332 09/03/21 0158 09/05/21 0157 09/05/21 1136 09/05/21 1137 09/05/21 1142 09/06/21 0338  WBC 16.1* 13.8* 11.2* 11.8* 11.9*  --   --   --  9.9  NEUTROABS 13.0*  --   --   --   --   --   --   --   --   HGB 10.8* 9.8* 9.7* 9.5* 8.6* 9.2* 8.8* 8.8* 9.3*   HCT 34.3* 30.4* 30.2* 29.7* 27.0* 27.0* 26.0* 26.0* 28.9*  MCV 98.0 95.6 96.2 96.4 95.7  --   --   --  96.0  PLT 241 196 215 243 257  --   --   --  213    Basic Metabolic Panel: Recent Labs  Lab 09/01/21 0332 09/03/21 0158 09/04/21 0149 09/05/21 0157 09/05/21 1136 09/05/21 1137 09/05/21 1142 09/06/21 0338  NA 137 132* 135 133* 139 138 138 136  K 4.0 3.6 3.4* 3.8 4.0 4.0 4.1 3.9  CL 107 106 109 106  --   --   --  111  CO2 22 22 21* 19*  --   --   --  17*  GLUCOSE 180* 173* 134* 163*  --   --   --  175*  BUN 27* 21 20 21   --   --   --  21  CREATININE 1.36* 1.10 1.31* 1.32*  --   --   --  1.19  CALCIUM 8.2* 8.2* 8.1* 7.9*  --   --   --  7.9*    Liver Function Tests: Recent Labs  Lab 08/30/21 1213 09/03/21 0158  AST 44* 20  ALT 18 17  ALKPHOS 58 43  BILITOT 0.9 0.9  PROT 6.5 5.7*  ALBUMIN 3.2* 2.3*    Coagulation Profile: No results for input(s): INR, PROTIME in the last 168 hours. HbA1C: No results for input(s): HGBA1C in the last 72 hours.  CBG: Recent Labs  Lab 09/05/21 0532 09/05/21 1211 09/05/21 1602 09/05/21 2228 09/06/21 0611  GLUCAP 124* 119* 148* 172* 156*     Recent Results (from the past 240 hour(s))  Resp Panel by RT-PCR (Flu A&B, Covid) Nasopharyngeal Swab     Status: None   Collection Time: 08/30/21 11:47 AM   Specimen: Nasopharyngeal Swab; Nasopharyngeal(NP) swabs in vial transport medium  Result Value Ref Range Status   SARS Coronavirus 2 by RT PCR NEGATIVE NEGATIVE Final    Comment: (NOTE) SARS-CoV-2 target nucleic acids are NOT DETECTED.  The SARS-CoV-2 RNA is generally detectable in upper respiratory specimens during the acute phase of infection. The lowest concentration of SARS-CoV-2 viral copies this assay can detect is 138 copies/mL. A negative result does not preclude SARS-Cov-2 infection and should not be used as the sole basis for treatment or other patient management decisions. A negative result may occur with  improper  specimen collection/handling, submission of specimen other than nasopharyngeal swab, presence of viral mutation(s) within the areas targeted by this assay, and inadequate number of viral copies(<138 copies/mL). A negative result must be combined with clinical observations, patient history, and epidemiological information. The expected result is Negative.  Fact Sheet for Patients:  EntrepreneurPulse.com.au  Fact Sheet for Healthcare Providers:  IncredibleEmployment.be  This test is no t yet approved or cleared by the [place] FDA and  has been authorized for detection and/or diagnosis of SARS-CoV-2 by FDA under an Emergency Use Authorization (EUA). This EUA will remain  in effect (meaning this test can be used) for the duration of the COVID-19 declaration under Section 564(b)(1) of the Act, 21 U.S.C.section 360bbb-3(b)(1), unless the authorization is terminated  or revoked sooner.       Influenza A by PCR NEGATIVE NEGATIVE Final   Influenza B by PCR NEGATIVE NEGATIVE Final    Comment: (NOTE) The Xpert Xpress SARS-CoV-2/FLU/RSV plus assay is intended as an aid in the diagnosis of influenza from Nasopharyngeal swab specimens and should not be used as a sole basis for treatment. Nasal washings and aspirates are unacceptable for Xpert Xpress SARS-CoV-2/FLU/RSV testing.  Fact Sheet for Patients: EntrepreneurPulse.com.au  Fact Sheet for Healthcare Providers: IncredibleEmployment.be  This test is not yet approved or cleared by the [place] FDA and has been authorized for detection and/or diagnosis of SARS-CoV-2 by FDA under an Emergency Use Authorization (EUA). This EUA will remain in effect (meaning this test can be used) for the duration of the COVID-19 declaration under Section 564(b)(1) of the Act, 21 U.S.C. section 360bbb-3(b)(1), unless the authorization is terminated or revoked.  Performed at  [facility], [place] [address], [place], [place] [address]   Blood culture (routine x 2)     Status: None   Collection Time: 08/30/21  4:34 PM   Specimen: BLOOD  Result Value Ref Range Status   Specimen Description BLOOD RIGHT ANTECUBITAL  Final   Special Requests   Final    BOTTLES DRAWN AEROBIC AND ANAEROBIC Blood Culture adequate volume  Culture   Final    NO GROWTH 5 DAYS Performed at [facility], [place] [address], [place], [place] [address]    Report Status 09/04/2021 FINAL  Final  Blood culture (routine x 2)     Status: None   Collection Time: 08/30/21  4:36 PM   Specimen: BLOOD  Result Value Ref Range Status   Specimen Description BLOOD LEFT ANTECUBITAL  Final   Special Requests   Final    BOTTLES DRAWN AEROBIC AND ANAEROBIC Blood Culture adequate volume   Culture   Final    NO GROWTH 5 DAYS Performed at [facility], [place] [address], [place], [place] [address]    Report Status 09/04/2021 FINAL  Final  MRSA Next Gen by PCR, Nasal     Status: Abnormal   Collection Time: 08/31/21  6:01 AM   Specimen: Nasal Mucosa; Nasal Swab  Result Value Ref Range Status   MRSA by PCR Next Gen DETECTED (A) NOT DETECTED Final    Comment: RESULT CALLED TO, READ BACK BY AND VERIFIED WITH: Earl Lites RN, AT [phone] 08/31/21 D. VANHOOK (NOTE) The GeneXpert MRSA Assay (FDA approved for NASAL specimens only), is one component of a comprehensive MRSA colonization surveillance program. It is not intended to diagnose MRSA infection nor to guide or monitor treatment for MRSA infections. Test performance is not FDA approved in patients less than 39 years old. Performed at [facility], [place] [address], [place], [place] [address]       Radiology Studies: CARDIAC CATHETERIZATION  Result Date: 09/05/2021 Images from the original result were not included. KSEAN VALE is a 66 y.o. male  [id number] LOCATION:  FACILITY: [place] PHYSICIAN: Quay Burow, M.D. 1956-03-31 DATE OF  PROCEDURE:  09/05/2021 DATE OF DISCHARGE: CARDIAC CATHETERIZATION History obtained from chart review.66 y.o. male with severe peripheral vascular disease awaiting right lower extremity arterial bypass with prior left BKA.  Recent chest CT demonstrates quite significant coronary artery calcification.  In addition, there was calcification on the pericardium noted on CT.  The patient presents today for right left heart cath for preoperative clearance before peripheral vascular surgery.   Mr. Grabe has a left dominant system with no obstructive disease.  His filling pressures were fairly normal.  Cardiac output was 6.6 L/min with an index of 3 L/min/m and his LVEDP was 14.  He is a low risk for undergoing vascular surgery.  The radial sheath was removed and a TR band was placed on the right wrist to achieve patent hemostasis.  The patient left lab in stable condition.  Dr. Johnsie Cancel, the patient's attending cardiologist, was notified of these results. Quay Burow. MD, [facility] 09/05/2021 12:10 PM      Marzetta Board, MD, PhD [facility]  Between 7 am - 7 pm I am available, please contact me via Amion (for emergencies) or Securechat (non urgent messages)  Between 7 pm - 7 am I am not available, please contact night coverage MD/APP via Amion

## 2021-09-06 NOTE — Progress Notes (Signed)
Progress Note  Patient Name: Brian Martin Date of Encounter: 09/06/2021  [facility] Cardiologist:  Brian Martin   Subjective   No chest pain or dyspnea mentation seems a bit slow this am  Inpatient Medications    Scheduled Meds:  aspirin EC  81 mg Oral Daily   benzonatate  200 mg Oral TID   carvedilol  25 mg Oral BID WC   docusate sodium  100 mg Oral BID   furosemide  20 mg Oral Daily   guaiFENesin  1,200 mg Oral BID   insulin aspart  0-15 Units Subcutaneous TID WC   insulin aspart  3 Units Subcutaneous TID WC   insulin glargine-yfgn  25 Units Subcutaneous q morning   mometasone-formoterol  2 puff Inhalation BID   mupirocin ointment  1 application Nasal BID   pantoprazole  40 mg Oral BID AC   rosuvastatin  20 mg Oral Daily   sertraline  25 mg Oral Daily   sodium chloride flush  3 mL Intravenous Q12H   sodium chloride flush  3 mL Intravenous Q12H   Continuous Infusions:  sodium chloride     sodium chloride     ceFEPime (MAXIPIME) IV 2 g (09/06/21 7654)   metronidazole Stopped (09/06/21 0351)   vancomycin Stopped (09/06/21 0030)   PRN Meds: sodium chloride, sodium chloride, acetaminophen, albuterol, labetalol, LORazepam, morphine injection, ondansetron (ZOFRAN) IV, sodium chloride flush, sodium chloride flush   Vital Signs    Vitals:   09/05/21 2057 09/05/21 2305 09/06/21 0310 09/06/21 0747  BP:  134/64 115/60 (!) 142/66  Pulse:  78  69  Resp:  16 16 16   Temp:  98.8 F (37.1 C) 97.8 F (36.6 C) 97.9 F (36.6 C)  TempSrc:  Oral Axillary Oral  SpO2: 96% 97% 98% 97%  Weight:      Height:        Intake/Output Summary (Last 24 hours) at 09/06/2021 0817 Last data filed at 09/06/2021 6503 Gross per 24 hour  Intake 2611.32 ml  Output 1450 ml  Net 1161.32 ml   Last 3 Weights 08/30/2021 12/02/2018 11/21/2017  Weight (lbs) 216 lb 4.3 oz 225 lb 231 lb  Weight (kg) 98.1 kg 102.059 kg 104.781 kg      Telemetry  NSR rare PVC;s 09/06/2021   ECG    Sinus rhythm  left axis deviation- Personally Reviewed  Physical Exam   Chronically ill pale male Lungs clear No murmur  Post left BKA PVD RLE with decreased pulse DP marked  No edema  Neuro non focal  Right radial cath site A   Labs    High Sensitivity Troponin:   Recent Labs  Lab 08/30/21 1213 08/30/21 1440  TROPONINIHS 23* 22*     Chemistry Recent Labs  Lab 08/30/21 1213 08/31/21 0313 09/03/21 0158 09/04/21 0149 09/05/21 0157 09/05/21 1136 09/05/21 1137 09/05/21 1142 09/06/21 0338  NA 139   < > 132* 135 133*   < > 138 138 136  K 4.0   < > 3.6 3.4* 3.8   < > 4.0 4.1 3.9  CL 106   < > 106 109 106  --   --   --  111  CO2 23   < > 22 21* 19*  --   --   --  17*  GLUCOSE 252*   < > 173* 134* 163*  --   --   --  175*  BUN 31*   < > 21 20 21   --   --   --  21  CREATININE 1.44*   < > 1.10 1.31* 1.32*  --   --   --  1.19  CALCIUM 8.5*   < > 8.2* 8.1* 7.9*  --   --   --  7.9*  PROT 6.5  --  5.7*  --   --   --   --   --   --   ALBUMIN 3.2*  --  2.3*  --   --   --   --   --   --   AST 44*  --  20  --   --   --   --   --   --   ALT 18  --  17  --   --   --   --   --   --   ALKPHOS 58  --  43  --   --   --   --   --   --   BILITOT 0.9  --  0.9  --   --   --   --   --   --   GFRNONAA 54*   < > >60 >60 59*  --   --   --  >60  ANIONGAP 10   < > 4* 5 8  --   --   --  8   < > = values in this interval not displayed.    Lipids No results for input(s): CHOL, TRIG, HDL, LABVLDL, LDLCALC, CHOLHDL in the last 168 hours.  Hematology Recent Labs  Lab 09/03/21 0158 09/05/21 0157 09/05/21 1136 09/05/21 1137 09/05/21 1142 09/06/21 0338  WBC 11.8* 11.9*  --   --   --  9.9  RBC 3.08* 2.82*  --   --   --  3.01*  HGB 9.5* 8.6*   < > 8.8* 8.8* 9.3*  HCT 29.7* 27.0*   < > 26.0* 26.0* 28.9*  MCV 96.4 95.7  --   --   --  96.0  MCH 30.8 30.5  --   --   --  30.9  MCHC 32.0 31.9  --   --   --  32.2  RDW 12.7 12.7  --   --   --  12.9  PLT 243 257  --   --   --  287   < > = values in this interval  not displayed.   Thyroid  Recent Labs  Lab 08/30/21 1213  TSH 1.194    BNP Recent Labs  Lab 08/30/21 1213  BNP 117.2*    DDimer No results for input(s): DDIMER in the last 168 hours.   Radiology    CARDIAC CATHETERIZATION  Result Date: 09/05/2021 Images from the original result were not included. Brian Martin is a 66 y.o. male  [id number] LOCATION:  FACILITY: [place] PHYSICIAN: Brian Martin, M.D. 12-29-1955 DATE OF PROCEDURE:  09/05/2021 DATE OF DISCHARGE: CARDIAC CATHETERIZATION History obtained from chart review.66 y.o. male with severe peripheral vascular disease awaiting right lower extremity arterial bypass with prior left BKA.  Recent chest CT demonstrates quite significant coronary artery calcification.  In addition, there was calcification on the pericardium noted on CT.  The patient presents today for right left heart cath for preoperative clearance before peripheral vascular surgery.   [name] has a left dominant system with no obstructive disease.  His filling pressures were fairly normal.  Cardiac output was 6.6 L/min with an index of 3 L/min/m and his LVEDP was 14.  He  is a low risk for undergoing vascular surgery.  The radial sheath was removed and a TR band was placed on the right wrist to achieve patent hemostasis.  The patient left lab in stable condition.  Dr. Johnsie Martin, the patient's attending cardiologist, was notified of these results. Brian Martin. MD, [facility] 09/05/2021 12:10 PM     Cardiac Studies   Prior CT of chest reviewed-coronary calcification noted  ECHO 09/03/21:   1. Left ventricular ejection fraction, by estimation, is 55 to 60%. The  left ventricle has normal function. The left ventricle has no regional  wall motion abnormalities. Left ventricular diastolic parameters were  normal.   2. Right ventricular systolic function is normal. The right ventricular  size is normal.   3. Left atrial size was mildly dilated.   4. The mitral valve is normal in  structure. No evidence of mitral valve  regurgitation. No evidence of mitral stenosis.   5. The aortic valve is normal in structure. Aortic valve regurgitation is  not visualized. No aortic stenosis is present.   6. The inferior vena cava is dilated in size with <50% respiratory  variability, suggesting right atrial pressure of 15 mmHg.   Patient Profile     66 y.o. male with severe peripheral vascular disease awaiting right lower extremity arterial bypass with prior left BKA.  Recent chest CT demonstrates quite significant coronary artery calcification. Cath 09/05/21 no obstructive CAD   Assessment & Plan    Preoperative risk assessment - No obstructive CAD on cath 09/05/21 low risk for vascular surgery Filling pressures ok no evidence of constriction mean PCWP only 14 mmHg   Coronary artery calcification - Left main RCA LAD noted on chest CT. - Started aspirin 81 mg  Hyperlipidemia - Now on crestor 20 mg daily   Mildly elevated troponin - Flat, demand ischemia in the setting of his underlying vascular disease.  Chronic kidney disease stage IIIa - Creatinine 1.19 this am   Osteomyelitis - IV cefepime, metronidazole, vancomycin.  Stable  Ok to proceed with vascular w/u     Signed, Jenkins Rouge, MD  09/06/2021, 8:17 AM

## 2021-09-07 ENCOUNTER — Encounter (HOSPITAL_COMMUNITY)
Admission: EM | Disposition: A | Discharge status: Skilled Nursing Facility | Payer: Self-pay | Source: Skilled Nursing Facility | Attending: Internal Medicine

## 2021-09-07 ENCOUNTER — Encounter (HOSPITAL_COMMUNITY): Payer: Self-pay | Admitting: Internal Medicine

## 2021-09-07 ENCOUNTER — Inpatient Hospital Stay (HOSPITAL_COMMUNITY): Payer: Medicare Other

## 2021-09-07 ENCOUNTER — Inpatient Hospital Stay (HOSPITAL_COMMUNITY): Payer: Medicare Other | Admitting: Anesthesiology

## 2021-09-07 DIAGNOSIS — L97519 Non-pressure chronic ulcer of other part of right foot with unspecified severity: Secondary | ICD-10-CM

## 2021-09-07 DIAGNOSIS — L8990 Pressure ulcer of unspecified site, unspecified stage: Secondary | ICD-10-CM | POA: Insufficient documentation

## 2021-09-07 DIAGNOSIS — I2510 Atherosclerotic heart disease of native coronary artery without angina pectoris: Secondary | ICD-10-CM

## 2021-09-07 DIAGNOSIS — Z01818 Encounter for other preprocedural examination: Secondary | ICD-10-CM | POA: Diagnosis not present

## 2021-09-07 HISTORY — PX: FEMORAL-POPLITEAL BYPASS GRAFT: SHX937

## 2021-09-07 HISTORY — PX: ULTRASOUND GUIDANCE FOR VASCULAR ACCESS: SHX6516

## 2021-09-07 HISTORY — PX: ENDARTERECTOMY FEMORAL: SHX5804

## 2021-09-07 HISTORY — PX: INTRAOPERATIVE ARTERIOGRAM: SHX5157

## 2021-09-07 LAB — POCT I-STAT, CHEM 8
BUN: 18 mg/dL (ref 8–23)
Calcium, Ion: 1.23 mmol/L (ref 1.15–1.40)
Chloride: 105 mmol/L (ref 98–111)
Creatinine, Ser: 1 mg/dL (ref 0.61–1.24)
Glucose, Bld: 195 mg/dL — ABNORMAL HIGH (ref 70–99)
HCT: 32 % — ABNORMAL LOW (ref 39.0–52.0)
Hemoglobin: 10.9 g/dL — ABNORMAL LOW (ref 13.0–17.0)
Potassium: 4.4 mmol/L (ref 3.5–5.1)
Sodium: 136 mmol/L (ref 135–145)
TCO2: 23 mmol/L (ref 22–32)

## 2021-09-07 LAB — POCT I-STAT 7, (LYTES, BLD GAS, ICA,H+H)
Acid-base deficit: 5 mmol/L — ABNORMAL HIGH (ref 0.0–2.0)
Bicarbonate: 22.6 mmol/L (ref 20.0–28.0)
Calcium, Ion: 1.26 mmol/L (ref 1.15–1.40)
HCT: 31 % — ABNORMAL LOW (ref 39.0–52.0)
Hemoglobin: 10.5 g/dL — ABNORMAL LOW (ref 13.0–17.0)
O2 Saturation: 99 %
Patient temperature: 36.6
Potassium: 5 mmol/L (ref 3.5–5.1)
Sodium: 137 mmol/L (ref 135–145)
TCO2: 24 mmol/L (ref 22–32)
pCO2 arterial: 51.5 mmHg — ABNORMAL HIGH (ref 32.0–48.0)
pH, Arterial: 7.248 — ABNORMAL LOW (ref 7.350–7.450)
pO2, Arterial: 154 mmHg — ABNORMAL HIGH (ref 83.0–108.0)

## 2021-09-07 LAB — BASIC METABOLIC PANEL
Anion gap: 7 (ref 5–15)
BUN: 19 mg/dL (ref 8–23)
CO2: 20 mmol/L — ABNORMAL LOW (ref 22–32)
Calcium: 8 mg/dL — ABNORMAL LOW (ref 8.9–10.3)
Chloride: 108 mmol/L (ref 98–111)
Creatinine, Ser: 1.17 mg/dL (ref 0.61–1.24)
GFR, Estimated: >60 mL/min (ref >60)
Glucose, Bld: 149 mg/dL — ABNORMAL HIGH (ref 70–99)
Potassium: 3.7 mmol/L (ref 3.5–5.1)
Sodium: 135 mmol/L (ref 135–145)

## 2021-09-07 LAB — GLUCOSE, CAPILLARY
Glucose-Capillary: 147 mg/dL — ABNORMAL HIGH (ref 70–99)
Glucose-Capillary: 140 mg/dL — ABNORMAL HIGH (ref 70–99)
Glucose-Capillary: 196 mg/dL — ABNORMAL HIGH (ref 70–99)
Glucose-Capillary: 194 mg/dL — ABNORMAL HIGH (ref 70–99)
Glucose-Capillary: 218 mg/dL — ABNORMAL HIGH (ref 70–99)

## 2021-09-07 LAB — CBC
HCT: 30.6 % — ABNORMAL LOW (ref 39.0–52.0)
Hemoglobin: 10.2 g/dL — ABNORMAL LOW (ref 13.0–17.0)
MCH: 31.1 pg (ref 26.0–34.0)
MCHC: 33.3 g/dL (ref 30.0–36.0)
MCV: 93.3 fL (ref 80.0–100.0)
Platelets: 314 10*3/uL (ref 150–400)
RBC: 3.28 MIL/uL — ABNORMAL LOW (ref 4.22–5.81)
RDW: 12.7 % (ref 11.5–15.5)
WBC: 10 10*3/uL (ref 4.0–10.5)
nRBC: 0 % (ref 0.0–0.2)

## 2021-09-07 LAB — POCT ACTIVATED CLOTTING TIME
Activated Clotting Time: 257 seconds
Activated Clotting Time: 233 seconds

## 2021-09-07 LAB — TYPE AND SCREEN
ABO/RH(D): O POS
Antibody Screen: NEGATIVE

## 2021-09-07 SURGERY — BYPASS GRAFT FEMORAL-POPLITEAL ARTERY
Anesthesia: General | Laterality: Right

## 2021-09-07 MED ORDER — SUGAMMADEX SODIUM 200 MG/2ML IV SOLN
INTRAVENOUS | Status: DC | PRN
  Administered 2021-09-07: 200 mg via INTRAVENOUS

## 2021-09-07 MED ORDER — PROPOFOL 10 MG/ML IV BOLUS
INTRAVENOUS | Status: DC | PRN
Start: 2021-09-07 — End: 2021-09-07
  Administered 2021-09-07: 120 mg via INTRAVENOUS

## 2021-09-07 MED ORDER — FENTANYL CITRATE (PF) 250 MCG/5ML IJ SOLN
INTRAMUSCULAR | Status: DC | PRN
  Administered 2021-09-07: 50 ug via INTRAVENOUS
  Administered 2021-09-07 (×2): 25 ug via INTRAVENOUS
  Administered 2021-09-07 (×3): 50 ug via INTRAVENOUS
  Administered 2021-09-07: 100 ug via INTRAVENOUS

## 2021-09-07 MED ORDER — MIDAZOLAM HCL 2 MG/2ML IJ SOLN
INTRAMUSCULAR | Status: AC
  Filled 2021-09-07: qty 2

## 2021-09-07 MED ORDER — SODIUM CHLORIDE 0.9 % IV SOLN: INTRAVENOUS | Status: AC

## 2021-09-07 MED ORDER — LIDOCAINE 2% (20 MG/ML) 5 ML SYRINGE
INTRAMUSCULAR | Status: DC | PRN
  Administered 2021-09-07: 60 mg via INTRAVENOUS

## 2021-09-07 MED ORDER — SODIUM CHLORIDE 0.9 % IV SOLN: 500.0000 mL | Freq: Once | INTRAVENOUS | Status: DC | PRN

## 2021-09-07 MED ORDER — ROCURONIUM BROMIDE 10 MG/ML (PF) SYRINGE
PREFILLED_SYRINGE | INTRAVENOUS | Status: AC
  Filled 2021-09-07: qty 10

## 2021-09-07 MED ORDER — HEPARIN SODIUM (PORCINE) 1000 UNIT/ML IJ SOLN
INTRAMUSCULAR | Status: DC | PRN
  Administered 2021-09-07: 10000 [IU] via INTRAVENOUS
  Administered 2021-09-07: 4000 [IU] via INTRAVENOUS

## 2021-09-07 MED ORDER — HEPARIN SODIUM (PORCINE) 5000 UNIT/ML IJ SOLN
5000.0000 [IU] | Freq: Three times a day (TID) | INTRAMUSCULAR | Status: DC
  Administered 2021-09-08 – 2021-09-09 (×4): 5000 [IU] via SUBCUTANEOUS
  Filled 2021-09-07 (×4): qty 1

## 2021-09-07 MED ORDER — FENTANYL CITRATE (PF) 250 MCG/5ML IJ SOLN
INTRAMUSCULAR | Status: AC
  Filled 2021-09-07: qty 5

## 2021-09-07 MED ORDER — LABETALOL HCL 5 MG/ML IV SOLN
10.0000 mg | INTRAVENOUS | Status: DC | PRN
  Administered 2021-09-07: 10 mg via INTRAVENOUS
  Filled 2021-09-07: qty 4

## 2021-09-07 MED ORDER — ONDANSETRON HCL 4 MG/2ML IJ SOLN
INTRAMUSCULAR | Status: DC | PRN
  Administered 2021-09-07: 4 mg via INTRAVENOUS

## 2021-09-07 MED ORDER — ORAL CARE MOUTH RINSE: 15.0000 mL | Freq: Once | OROMUCOSAL | Status: AC

## 2021-09-07 MED ORDER — CHLORHEXIDINE GLUCONATE CLOTH 2 % EX PADS: 6.0000 | MEDICATED_PAD | Freq: Every day | CUTANEOUS | Status: DC

## 2021-09-07 MED ORDER — FENTANYL CITRATE (PF) 100 MCG/2ML IJ SOLN: 25.0000 ug | INTRAMUSCULAR | Status: DC | PRN

## 2021-09-07 MED ORDER — HYDRALAZINE HCL 20 MG/ML IJ SOLN: 5.0000 mg | INTRAMUSCULAR | Status: DC | PRN

## 2021-09-07 MED ORDER — ALBUMIN HUMAN 5 % IV SOLN: INTRAVENOUS | Status: DC | PRN

## 2021-09-07 MED ORDER — PROTAMINE SULFATE 10 MG/ML IV SOLN
INTRAVENOUS | Status: DC | PRN
Start: 2021-09-07 — End: 2021-09-07
  Administered 2021-09-07: 50 mg via INTRAVENOUS

## 2021-09-07 MED ORDER — ROCURONIUM BROMIDE 10 MG/ML (PF) SYRINGE
PREFILLED_SYRINGE | INTRAVENOUS | Status: DC | PRN
  Administered 2021-09-07 (×3): 20 mg via INTRAVENOUS
  Administered 2021-09-07: 30 mg via INTRAVENOUS
  Administered 2021-09-07: 20 mg via INTRAVENOUS
  Administered 2021-09-07: 70 mg via INTRAVENOUS
  Administered 2021-09-07: 20 mg via INTRAVENOUS

## 2021-09-07 MED ORDER — CHLORHEXIDINE GLUCONATE 0.12 % MT SOLN
15.0000 mL | Freq: Once | OROMUCOSAL | Status: AC
  Administered 2021-09-07: 15 mL via OROMUCOSAL
  Filled 2021-09-07: qty 15

## 2021-09-07 MED ORDER — METOPROLOL TARTRATE 5 MG/5ML IV SOLN: 2.0000 mg | INTRAVENOUS | Status: DC | PRN

## 2021-09-07 MED ORDER — OXYCODONE-ACETAMINOPHEN 5-325 MG PO TABS
1.0000 | ORAL_TABLET | ORAL | Status: DC | PRN
  Administered 2021-09-07 – 2021-09-08 (×2): 1 via ORAL
  Administered 2021-09-08: 2 via ORAL
  Filled 2021-09-07 (×2): qty 1
  Filled 2021-09-07: qty 2

## 2021-09-07 MED ORDER — PROPOFOL 10 MG/ML IV BOLUS
INTRAVENOUS | Status: AC
  Filled 2021-09-07: qty 20

## 2021-09-07 MED ORDER — LACTATED RINGERS IV SOLN: INTRAVENOUS | Status: DC

## 2021-09-07 MED ORDER — LACTATED RINGERS IV SOLN: INTRAVENOUS | Status: DC | PRN

## 2021-09-07 MED ORDER — DEXAMETHASONE SODIUM PHOSPHATE 10 MG/ML IJ SOLN
INTRAMUSCULAR | Status: DC | PRN
  Administered 2021-09-07: 4 mg via INTRAVENOUS

## 2021-09-07 MED ORDER — ACETAMINOPHEN 10 MG/ML IV SOLN: 1000.0000 mg | Freq: Once | INTRAVENOUS | Status: DC | PRN

## 2021-09-07 MED ORDER — CEFAZOLIN SODIUM-DEXTROSE 2-4 GM/100ML-% IV SOLN
2.0000 g | Freq: Three times a day (TID) | INTRAVENOUS | Status: DC
  Administered 2021-09-07: 2 g via INTRAVENOUS
  Filled 2021-09-07: qty 100

## 2021-09-07 MED ORDER — HEPARIN 6000 UNIT IRRIGATION SOLUTION
Status: DC | PRN
  Administered 2021-09-07: 1

## 2021-09-07 MED ORDER — PHENYLEPHRINE HCL-NACL 20-0.9 MG/250ML-% IV SOLN
INTRAVENOUS | Status: DC | PRN
  Administered 2021-09-07: 25 ug/min via INTRAVENOUS

## 2021-09-07 MED ORDER — HEPARIN 6000 UNIT IRRIGATION SOLUTION
Status: AC
  Filled 2021-09-07: qty 500

## 2021-09-07 MED ORDER — 0.9 % SODIUM CHLORIDE (POUR BTL) OPTIME
TOPICAL | Status: DC | PRN
  Administered 2021-09-07: 2000 mL

## 2021-09-07 MED ORDER — PHENYLEPHRINE 40 MCG/ML (10ML) SYRINGE FOR IV PUSH (FOR BLOOD PRESSURE SUPPORT)
PREFILLED_SYRINGE | INTRAVENOUS | Status: DC | PRN
  Administered 2021-09-07 (×2): 40 ug via INTRAVENOUS

## 2021-09-07 MED ORDER — SODIUM CHLORIDE (PF) 0.9 % IJ SOLN
INTRAVENOUS | Status: DC | PRN
  Administered 2021-09-07: 7 mL via INTRAMUSCULAR

## 2021-09-07 SURGICAL SUPPLY — 89 items
BAG COUNTER SPONGE SURGICOUNT (BAG) ×5 IMPLANT
BANDAGE ESMARK 6X9 LF (GAUZE/BANDAGES/DRESSINGS) IMPLANT
BNDG ESMARK 6X9 LF (GAUZE/BANDAGES/DRESSINGS) ×3
CANISTER SUCT 3000ML PPV (MISCELLANEOUS) ×3 IMPLANT
CANNULA VESSEL 3MM 2 BLNT TIP (CANNULA) ×3 IMPLANT
CATH EMB 4FR 40CM (CATHETERS) ×1 IMPLANT
CATH EMB 4FR 80CM (CATHETERS) ×1 IMPLANT
CATH OMNI FLUSH 5F 65CM (CATHETERS) ×1 IMPLANT
CLIP VESOCCLUDE MED 24/CT (CLIP) ×3 IMPLANT
CLIP VESOCCLUDE SM WIDE 24/CT (CLIP) ×3 IMPLANT
COVER LIGHT HANDLE  1/PK (MISCELLANEOUS) ×1
COVER LIGHT HANDLE 1/PK (MISCELLANEOUS) IMPLANT
COVER PROBE W GEL 5X96 (DRAPES) ×1 IMPLANT
COVER SURGICAL LIGHT HANDLE (MISCELLANEOUS) ×2 IMPLANT
CUFF TOURN SGL QUICK 24 (TOURNIQUET CUFF)
CUFF TOURN SGL QUICK 34 (TOURNIQUET CUFF) ×1
CUFF TOURN SGL QUICK 42 (TOURNIQUET CUFF) IMPLANT
CUFF TRNQT CYL 24X4X16.5-23 (TOURNIQUET CUFF) IMPLANT
CUFF TRNQT CYL 34X4.125X (TOURNIQUET CUFF) IMPLANT
DERMABOND ADVANCED (GAUZE/BANDAGES/DRESSINGS) ×2
DERMABOND ADVANCED .7 DNX12 (GAUZE/BANDAGES/DRESSINGS) ×2 IMPLANT
DEVICE VASC CLSR CELT ART 5 (Vascular Products) ×1 IMPLANT
DRAIN CHANNEL 15F RND FF W/TCR (WOUND CARE) IMPLANT
DRAPE C-ARM 42X72 X-RAY (DRAPES) ×1 IMPLANT
DRAPE HALF SHEET 40X57 (DRAPES) IMPLANT
DRAPE INCISE IOBAN 66X45 STRL (DRAPES) ×1 IMPLANT
DRAPE X-RAY CASS 24X20 (DRAPES) IMPLANT
ELECT REM PT RETURN 9FT ADLT (ELECTROSURGICAL) ×3
ELECTRODE REM PT RTRN 9FT ADLT (ELECTROSURGICAL) ×2 IMPLANT
EVACUATOR SILICONE 100CC (DRAIN) IMPLANT
GAUZE 4X4 16PLY ~~LOC~~+RFID DBL (SPONGE) ×1 IMPLANT
GAUZE SPONGE 4X4 12PLY STRL LF (GAUZE/BANDAGES/DRESSINGS) ×1 IMPLANT
GLIDEWIRE NITREX 0.018X80X5 (WIRE) ×1
GLOVE SRG 8 PF TXTR STRL LF DI (GLOVE) ×2 IMPLANT
GLOVE SURG MICRO LTX SZ6 (GLOVE) ×3 IMPLANT
GLOVE SURG MICRO LTX SZ6.5 (GLOVE) ×1 IMPLANT
GLOVE SURG POLYISO LF SZ7.5 (GLOVE) ×5 IMPLANT
GLOVE SURG PR MICRO ENCORE 7.5 (GLOVE) ×3 IMPLANT
GLOVE SURG UNDER POLY LF SZ6.5 (GLOVE) ×6 IMPLANT
GLOVE SURG UNDER POLY LF SZ8 (GLOVE) ×4
GOWN STRL REUS W/ TWL LRG LVL3 (GOWN DISPOSABLE) ×4 IMPLANT
GOWN STRL REUS W/ TWL XL LVL3 (GOWN DISPOSABLE) ×2 IMPLANT
GOWN STRL REUS W/TWL LRG LVL3 (GOWN DISPOSABLE) ×6
GOWN STRL REUS W/TWL XL LVL3 (GOWN DISPOSABLE) ×6
GUIDEWIRE NITREX 0.018X80X5 (WIRE) IMPLANT
HEMOSTAT SNOW SURGICEL 2X4 (HEMOSTASIS) IMPLANT
INSERT FOGARTY SM (MISCELLANEOUS) ×1 IMPLANT
KIT BASIN OR (CUSTOM PROCEDURE TRAY) ×3 IMPLANT
KIT TURNOVER KIT B (KITS) ×3 IMPLANT
MARKER GRAFT CORONARY BYPASS (MISCELLANEOUS) IMPLANT
NS IRRIG 1000ML POUR BTL (IV SOLUTION) ×6 IMPLANT
PACK PERIPHERAL VASCULAR (CUSTOM PROCEDURE TRAY) ×3 IMPLANT
PAD ARMBOARD 7.5X6 YLW CONV (MISCELLANEOUS) ×6 IMPLANT
SET COLLECT BLD 21X3/4 12 (NEEDLE) IMPLANT
SET MICROPUNCTURE 5F STIFF (MISCELLANEOUS) ×1 IMPLANT
SHEATH PINNACLE 5F 10CM (SHEATH) ×1 IMPLANT
SHIELD RADPAD ABSORB 11X34 (MISCELLANEOUS) ×1 IMPLANT
SOL PREP POV-IOD 4OZ 10% (MISCELLANEOUS) ×2 IMPLANT
SOL SCRUB PVP POV-IOD 4OZ 7.5% (MISCELLANEOUS) ×6
SOLUTION SCRB POV-IOD 4OZ 7.5% (MISCELLANEOUS) IMPLANT
SPONGE T-LAP 18X18 ~~LOC~~+RFID (SPONGE) ×4 IMPLANT
STOPCOCK 4 WAY LG BORE MALE ST (IV SETS) ×1 IMPLANT
SURGIFLO W/THROMBIN 8M KIT (HEMOSTASIS) IMPLANT
SUT ETHILON 3 0 PS 1 (SUTURE) IMPLANT
SUT GORETEX 6.0 TT13 (SUTURE) IMPLANT
SUT GORETEX 6.0 TT9 (SUTURE) IMPLANT
SUT PROLENE 5 0 C 1 24 (SUTURE) ×8 IMPLANT
SUT PROLENE 6 0 BV (SUTURE) ×4 IMPLANT
SUT PROLENE 7 0 BV 1 (SUTURE) ×2 IMPLANT
SUT PROLENE 7 0 BV1 MDA (SUTURE) ×1 IMPLANT
SUT SILK 2 0 SH (SUTURE) ×3 IMPLANT
SUT SILK 3 0 (SUTURE) ×1
SUT SILK 3-0 18XBRD TIE 12 (SUTURE) IMPLANT
SUT VIC AB 2-0 CT1 27 (SUTURE) ×2
SUT VIC AB 2-0 CT1 TAPERPNT 27 (SUTURE) ×4 IMPLANT
SUT VIC AB 3-0 SH 27 (SUTURE) ×6
SUT VIC AB 3-0 SH 27X BRD (SUTURE) ×4 IMPLANT
SUT VICRYL 4-0 PS2 18IN ABS (SUTURE) ×6 IMPLANT
SYR 10ML LL (SYRINGE) ×2 IMPLANT
SYR 20ML LL LF (SYRINGE) ×1 IMPLANT
SYR 3ML LL SCALE MARK (SYRINGE) ×1 IMPLANT
SYR TB 1ML LUER SLIP (SYRINGE) ×1 IMPLANT
TAPE CLOTH SURG 4X10 WHT LF (GAUZE/BANDAGES/DRESSINGS) ×1 IMPLANT
TOWEL GREEN STERILE (TOWEL DISPOSABLE) ×3 IMPLANT
TRAY FOLEY MTR SLVR 16FR STAT (SET/KITS/TRAYS/PACK) ×3 IMPLANT
TUBING EXTENTION W/L.L. (IV SETS) IMPLANT
UNDERPAD 30X36 HEAVY ABSORB (UNDERPADS AND DIAPERS) ×3 IMPLANT
WATER STERILE IRR 1000ML POUR (IV SOLUTION) ×3 IMPLANT
WIRE BENTSON .035X145CM (WIRE) ×1 IMPLANT

## 2021-09-07 NOTE — Progress Notes (Signed)
Progress Note  Patient Name: Brian Martin Date of Encounter: 09/07/2021  [facility] Cardiologist:  Marlou Porch   Subjective   No cardiac complaints for surgery with Dr Trula Slade today   Inpatient Medications    Scheduled Meds:  aspirin EC  81 mg Oral Daily   carvedilol  25 mg Oral BID WC   docusate sodium  100 mg Oral BID   furosemide  20 mg Oral Daily   guaiFENesin  1,200 mg Oral BID   insulin aspart  0-15 Units Subcutaneous TID WC   insulin aspart  3 Units Subcutaneous TID WC   insulin glargine-yfgn  25 Units Subcutaneous q morning   mometasone-formoterol  2 puff Inhalation BID   mupirocin ointment  1 application Nasal BID   pantoprazole  40 mg Oral BID AC   rosuvastatin  20 mg Oral Daily   sertraline  25 mg Oral Daily   sodium chloride flush  3 mL Intravenous Q12H   sodium chloride flush  3 mL Intravenous Q12H   Continuous Infusions:  sodium chloride     sodium chloride     ceFEPime (MAXIPIME) IV 2 g (09/07/21 0981)   metronidazole 500 mg (09/07/21 0325)   vancomycin 1,000 mg (09/07/21 0106)   PRN Meds: sodium chloride, sodium chloride, acetaminophen, albuterol, labetalol, LORazepam, morphine injection, ondansetron (ZOFRAN) IV, sodium chloride flush, sodium chloride flush   Vital Signs    Vitals:   09/06/21 2347 09/07/21 0407 09/07/21 0800 09/07/21 0804  BP: 133/72 133/72  135/72  Pulse: 75 71  73  Resp: 19 18  16   Temp: 98.4 F (36.9 C) 98.4 F (36.9 C)  98.2 F (36.8 C)  TempSrc: Oral Oral  Oral  SpO2: 97% 96% 96% 96%  Weight:      Height:        Intake/Output Summary (Last 24 hours) at 09/07/2021 0903 Last data filed at 09/07/2021 0804 Gross per 24 hour  Intake 1368.49 ml  Output 4425 ml  Net -3056.51 ml   Last 3 Weights 08/30/2021 12/02/2018 11/21/2017  Weight (lbs) 216 lb 4.3 oz 225 lb 231 lb  Weight (kg) 98.1 kg 102.059 kg 104.781 kg      Telemetry  NSR rare PVC;s 09/07/2021   ECG    Sinus rhythm left axis deviation- Personally  Reviewed  Physical Exam   Chronically ill pale male Lungs clear No murmur  Post left BKA PVD RLE with decreased pulse DP marked  No edema  Neuro non focal  Right radial cath site A   Labs    High Sensitivity Troponin:   Recent Labs  Lab 08/30/21 1213 08/30/21 1440  TROPONINIHS 23* 22*     Chemistry Recent Labs  Lab 09/03/21 0158 09/04/21 0149 09/05/21 0157 09/05/21 1136 09/05/21 1142 09/06/21 0338 09/07/21 0448  NA 132*   < > 133*   < > 138 136 135  K 3.6   < > 3.8   < > 4.1 3.9 3.7  CL 106   < > 106  --   --  111 108  CO2 22   < > 19*  --   --  17* 20*  GLUCOSE 173*   < > 163*  --   --  175* 149*  BUN 21   < > 21  --   --  21 19  CREATININE 1.10   < > 1.32*  --   --  1.19 1.17  CALCIUM 8.2*   < > 7.9*  --   --  7.9* 8.0*  PROT 5.7*  --   --   --   --   --   --   ALBUMIN 2.3*  --   --   --   --   --   --   AST 20  --   --   --   --   --   --   ALT 17  --   --   --   --   --   --   ALKPHOS 43  --   --   --   --   --   --   BILITOT 0.9  --   --   --   --   --   --   GFRNONAA >60   < > 59*  --   --  >60 >60  ANIONGAP 4*   < > 8  --   --  8 7   < > = values in this interval not displayed.    Lipids No results for input(s): CHOL, TRIG, HDL, LABVLDL, LDLCALC, CHOLHDL in the last 168 hours.  Hematology Recent Labs  Lab 09/05/21 0157 09/05/21 1136 09/05/21 1142 09/06/21 0338 09/07/21 0448  WBC 11.9*  --   --  9.9 10.0  RBC 2.82*  --   --  3.01* 3.28*  HGB 8.6*   < > 8.8* 9.3* 10.2*  HCT 27.0*   < > 26.0* 28.9* 30.6*  MCV 95.7  --   --  96.0 93.3  MCH 30.5  --   --  30.9 31.1  MCHC 31.9  --   --  32.2 33.3  RDW 12.7  --   --  12.9 12.7  PLT 257  --   --  287 314   < > = values in this interval not displayed.   Thyroid  No results for input(s): TSH, FREET4 in the last 168 hours.   BNP No results for input(s): BNP, PROBNP in the last 168 hours.   DDimer No results for input(s): DDIMER in the last 168 hours.   Radiology    CARDIAC  CATHETERIZATION  Result Date: 09/05/2021 Images from the original result were not included. Brian Martin is a 66 y.o. male  [id number] LOCATION:  FACILITY: [facility] PHYSICIAN: Quay Burow, M.D. 20-Jul-1956 DATE OF PROCEDURE:  09/05/2021 DATE OF DISCHARGE: CARDIAC CATHETERIZATION History obtained from chart review.66 y.o. male with severe peripheral vascular disease awaiting right lower extremity arterial bypass with prior left BKA.  Recent chest CT demonstrates quite significant coronary artery calcification.  In addition, there was calcification on the pericardium noted on CT.  The patient presents today for right left heart cath for preoperative clearance before peripheral vascular surgery.   Mr. Milliron has a left dominant system with no obstructive disease.  His filling pressures were fairly normal.  Cardiac output was 6.6 L/min with an index of 3 L/min/m and his LVEDP was 14.  He is a low risk for undergoing vascular surgery.  The radial sheath was removed and a TR band was placed on the right wrist to achieve patent hemostasis.  The patient left lab in stable condition.  Dr. Johnsie Cancel, the patient's attending cardiologist, was notified of these results. Quay Burow. MD, [facility] 09/05/2021 12:10 PM     Cardiac Studies   Prior CT of chest reviewed-coronary calcification noted  ECHO 09/03/21:   1. Left ventricular ejection fraction, by estimation, is 55 to 60%. The  left ventricle has normal function. The  left ventricle has no regional  wall motion abnormalities. Left ventricular diastolic parameters were  normal.   2. Right ventricular systolic function is normal. The right ventricular  size is normal.   3. Left atrial size was mildly dilated.   4. The mitral valve is normal in structure. No evidence of mitral valve  regurgitation. No evidence of mitral stenosis.   5. The aortic valve is normal in structure. Aortic valve regurgitation is  not visualized. No aortic stenosis is present.   6. The  inferior vena cava is dilated in size with <50% respiratory  variability, suggesting right atrial pressure of 15 mmHg.   Patient Profile     66 y.o. male with severe peripheral vascular disease awaiting right lower extremity arterial bypass with prior left BKA.  Recent chest CT demonstrates quite significant coronary artery calcification. Cath 09/05/21 no obstructive CAD   Assessment & Plan    Preoperative risk assessment - No obstructive CAD on cath 09/05/21 low risk for vascular surgery Filling pressures ok no evidence of constriction mean PCWP only 14 mmHg Dr Trula Slade plans on limb salvage surgery today   Coronary artery calcification - Left main RCA LAD noted on chest CT. - Started aspirin 81 mg  Hyperlipidemia - Now on crestor 20 mg daily   Mildly elevated troponin - Flat, demand ischemia in the setting of his underlying vascular disease.  Chronic kidney disease stage IIIa - Creatinine 1.17 this am   Osteomyelitis - IV cefepime, metronidazole, vancomycin.  Stable     Signed, Jenkins Rouge, MD  09/07/2021, 9:03 AM

## 2021-09-07 NOTE — Progress Notes (Signed)
Pt back from OR. Vitals obtained. BP elevated. PRN labetalol given. Right leg and groin incisions clean and dry. Pedal signal present via doppler. Pt shakes head "no" when asked if in pain. Wife at bedside. Instructed pt to be NPO x4 hours.

## 2021-09-07 NOTE — Op Note (Signed)
Patient name: Brian Martin MRN: [id number] DOB: 09-20-1955 Sex: male  09/07/2021 Pre-operative Diagnosis: Right leg ulcer Post-operative diagnosis:  Same Surgeon:  [name] Assistants: Arlee Muslim, PA Procedure:   #1: Right femoral to above-knee popliteal artery bypass graft with ipsilateral 9 reversed saphenous vein   #2: Right iliofemoral endarterectomy   #3: Ultrasound-guided access, left common femoral artery   #4: Second-order catheterization and right leg angiogram   #5: Closure device, Celt Anesthesia:  General Blood Loss:  150 cc Specimens:  none  Findings: 4 to 5 mm saphenous vein.  The proximal anastomosis was to the distal common femoral artery.  The distal anastomosis was to the above-knee popliteal artery.  Both were good targets.  After completion of the bypass, I was not satisfied with flow through the bypass.  I was concerned about an inflow problem.  I performed angiography via a left femoral approach.  There appeared to be a significant flow-limiting plaque within the common femoral artery, possibly from a clamp injury.  I therefore removed the proximal anastomosis.  There was a fair amount of heaped up intima just proximal to the anastomosis which I felt was a clamp injury.  I therefore performed endarterectomy up under the inguinal ligament and established excellent inflow.  The anastomosis was then redone.  This time, there was excellent blood flow to the foot.  Indications: This is a 66 year old gentleman with history of left above-knee amputation who has developed ulcers on his right foot.  He underwent angiography which revealed an occluded superficial femoral artery.  Percutaneous revascularization was unsuccessful and so surgical revascularization was indicated.  He underwent preoperative cardiac evaluation with cardiac cath and was found to be low risk.  He is here today for bypass.  Procedure:  The patient was identified in the holding area and taken to [place] 11  The patient was then placed supine on the table. general anesthesia was administered.  The patient was prepped and draped in the usual sterile fashion.  A time out was called and antibiotics were administered.  A PA was necessary to expedite the procedure and assist with technical details.  Ultrasound was used to map the course of the saphenous vein throughout the right leg.  It was of excellent caliber.  I began by making an oblique incision in the right groin.  Cautery was used divide subcutaneous tissue down to the femoral sheath which was opened sharply.  I exposed the common femoral artery from the inguinal ligament down to the bifurcation.  The superficial femoral and profundofemoral artery were individually isolated.  Next, I began dissecting out the saphenous vein.  This was fully mobilized from the saphenofemoral junction down to the knee.  This was done through skip incisions.  Side branches were ligated between silk ties.  Through the distal incision I also exposed the above-knee popliteal artery.  This was a relatively soft artery.  Once I had adequate exposure, the saphenous vein was removed by ligating the proximal and distal ends with silk ties.  The vein was prepared on the back table.  It distended nicely with heparin saline measuring approximately 5 mm throughout.  It was uniform in caliber.  I then created a subsartorial tunnel with a Gore tunneler.  The patient was fully heparinized.  After the heparin circulated, the femoral vessels were occluded with vascular clamps.  A #11 blade was used to make an arteriotomy which was extended longitudinally.  The vein was placed in  a 9 reversed fashion and spatulated to fit the size the arteriotomy.  A running anastomosis was created with 5-0 Prolene.  The clamps were then released.  A valvulotome was used to lyse the valves.  Multiple passes were performed.  Next, the vein was brought through the previously created tunnel making sure to  maintain proper orientation.  A call sleeve was placed in the upper thigh and a tourniquet was placed.  The Esmarch was used to exsanguinate the leg and the tourniquet was taken to 250 mm of pressure.  I then opened the popliteal artery above the knee with a #11 blade.  This was extended longitudinally with Potts scissors.  The leg was straightened and the vein graft was cut the appropriate length and spatulated to fit the size the arteriotomy.  A running anastomosis was then performed with a 6-0 Prolene.  Prior to completion the tourniquet was let down and the appropriate flushing maneuvers were performed.  There was a palpable pulse within the vein graft however I was not getting good signals at the ankle.  I was concerned about a inflow problem.  I therefore elected to perform arteriography.  The left femoral artery was cannulated under ultrasound guidance with a micropuncture needle.  A 018 wire was advanced without resistance followed by placement of a micropuncture sheath.  A Bentson wire was inserted followed by 5 French sheath.  An Omni Flush catheter was then used to cross the aortic bifurcation.  The catheter was placed in the common iliac artery and pelvic angiogram was performed which showed no significant stenosis within the common or external iliac artery.  I then advanced the catheter over a wire into the distal external iliac artery and right leg evaluation was performed.  Immediately, I identified a near flow-limiting stenosis just proximal to the proximal anastomosis.  This was clearly the problem with the bypass graft.  I then removed the catheter and wire and flushed the sheath in the left groin.  Next, I occluded the common femoral artery and the vein graft and profundofemoral artery and remove the proximal anastomosis.  There was a heaped up intima proximal to the anastomosis, consistent with a clamp injury.  I then performed endarterectomy of the external iliac artery and common femoral  artery as well as the proximal profundofemoral artery.  I got a good endpoint and tacked it down with 6-0 Prolene.  I tested the inflow and there was excellent inflow at this time.  I then redid the proximal anastomosis in a running fashion with 5-0 Prolene.  Before removing the clamps, the vessels were flushed appropriately.  I then remove the clamps.  There was much better inflow.  The patient had a very brisk anterior tibial Doppler signal that was graft dependent.  Next, I reversed the heparin with 50 mg of protamine.  The wounds were irrigated.  Hemostasis was achieved.  The vein harvest incisions were closed with multiple layers of Vicryl and subcuticular closure.  The groin incision was closed by reapproximating the femoral sheath with 2-0 Vicryl.  Subcutaneous tissue was then closed with additional layers followed by subcuticular closure.  The distal incision was closed by reapproximating the fascia with 2-0 Vicryl and subcutaneous tissue with several layers with 3-0 Vicryl followed by subcuticular closure.  Dermabond was placed on the incisions.  The patient was successfully extubated taken recovery in stable condition.  There are no immediate complications.   Disposition: To PACU stable.   Theotis Burrow, M.D., FACS  [facility] [employer] [facility] Office: [phone] Pager:  [phone]

## 2021-09-07 NOTE — Anesthesia Procedure Notes (Signed)
Arterial Line Insertion Start/End1/06/2022 9:45 AM, 09/07/2021 9:50 AM Performed by: Wilburn Cornelia, CRNA, CRNA  Patient location: Pre-op. Preanesthetic checklist: patient identified, IV checked, site marked, risks and benefits discussed, surgical consent, monitors and equipment checked, pre-op evaluation, timeout performed and anesthesia consent Lidocaine 1% used for infiltration Left, radial was placed Catheter size: 20 G Hand hygiene performed  and maximum sterile barriers used  Allen's test indicative of satisfactory collateral circulation Attempts: 2 Procedure performed without using ultrasound guided technique. Following insertion, Biopatch and dressing applied. Post procedure assessment: normal  Patient tolerated the procedure well with no immediate complications.

## 2021-09-07 NOTE — Transfer of Care (Signed)
Immediate Anesthesia Transfer of Care Note  Patient: Brian Martin [facility]  Procedure(s) Performed: BYPASS GRAFT RIGHT FEMORAL-POPLITEAL ARTERY WITH VEIN (Right) INTRA OPERATIVE ARTERIOGRAM (Right) ENDARTERECTOMY FEMORAL (Right) ULTRASOUND GUIDANCE FOR VASCULAR ACCESS LEFT FEMORAL ARTERY (Left)  Patient Location: PACU  Anesthesia Type:General  Level of Consciousness: sedated  Airway & Oxygen Therapy: Patient Spontanous Breathing and Patient connected to face mask oxygen  Post-op Assessment: Report given to RN and Post -op Vital signs reviewed and stable  Post vital signs: Reviewed and stable  Last Vitals:  Vitals Value Taken Time  BP    Temp    Pulse 73 09/07/21 1705  Resp 16 09/07/21 1705  SpO2 96 % 09/07/21 1705  Vitals shown include unvalidated device data.  Last Pain:  Vitals:   09/07/21 0929  TempSrc: Oral  PainSc: 0-No pain         Complications: No notable events documented.

## 2021-09-07 NOTE — Anesthesia Preprocedure Evaluation (Addendum)
Anesthesia Evaluation  Patient identified by MRN, date of birth, ID band Patient awake    Reviewed: Allergy & Precautions, NPO status , Patient's Chart, lab work & pertinent test results  Airway Mallampati: II  TM Distance: >3 FB Neck ROM: Full    Dental  (+) Poor Dentition, Missing   Pulmonary neg pulmonary ROS, former smoker,    Pulmonary exam normal        Cardiovascular hypertension, Pt. on medications and Pt. 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    GI/Hepatic Neg liver ROS, GERD  Medicated,  Endo/Other  diabetes, Insulin Dependent  Renal/GU CRFRenal disease     Musculoskeletal   Abdominal Normal abdominal exam  (+)   Peds  Hematology   Anesthesia Other Findings   Reproductive/Obstetrics                           Anesthesia Physical Anesthesia Plan  ASA: 3  Anesthesia Plan: General   Post-op Pain Management:    Induction: Intravenous  PONV Risk Score and Plan: 2 and Ondansetron, Treatment may vary due to age or medical condition and Dexamethasone  Airway Management Planned: Mask and Oral ETT  Additional Equipment: Arterial line  Intra-op Plan:   Post-operative Plan: Extubation in OR  Informed Consent: I have reviewed the patients History and Physical, chart, labs and discussed the procedure including the risks, benefits and alternatives for the proposed anesthesia with the patient or authorized representative who has indicated his/her understanding and acceptance.     Dental advisory given  Plan Discussed with: CRNA  Anesthesia Plan Comments: (Lab Results      Component                Value               Date                      WBC                      10.0                09/07/2021                 HGB                      10.2 (L)            09/07/2021                HCT                      30.6 (L)            09/07/2021                MCV                      93.3                09/07/2021                PLT                      314  09/07/2021           Lab Results      Component                Value               Date                      NA                       135                 09/07/2021                K                        3.7                 09/07/2021                CO2                      20 (L)              09/07/2021                GLUCOSE                  149 (H)             09/07/2021                BUN                      19                  09/07/2021                CREATININE               1.17                09/07/2021                CALCIUM                  8.0 (L)             09/07/2021                GFRNONAA                 >60                 09/07/2021           ECHO 09/03/21: 1. Left ventricular ejection fraction, by estimation, is 55 to 60%. The  left ventricle has normal function. The left ventricle has no regional  wall motion abnormalities. Left ventricular diastolic parameters were  normal.  2. Right ventricular systolic function is normal. The right ventricular  size is normal.  3. Left atrial size was mildly dilated.  4. The mitral valve is normal in structure. No evidence of mitral valve  regurgitation. No evidence of mitral stenosis.  5. The aortic valve is normal in structure. Aortic valve regurgitation is  not visualized. No aortic stenosis is present.  6. The inferior vena cava is dilated in size with <50% respiratory  variability, suggesting right  atrial pressure of 15 mmHg.  )       Anesthesia Quick Evaluation

## 2021-09-07 NOTE — Anesthesia Procedure Notes (Signed)
Procedure Name: Intubation Date/Time: 09/07/2021 10:55 AM Performed by: Wilburn Cornelia, CRNA Pre-anesthesia Checklist: Patient identified, Emergency Drugs available, Suction available, Patient being monitored and Timeout performed Patient Re-evaluated:Patient Re-evaluated prior to induction Oxygen Delivery Method: Circle system utilized Preoxygenation: Pre-oxygenation with 100% oxygen Induction Type: IV induction Ventilation: Mask ventilation without difficulty Laryngoscope Size: Mac and 4 Grade View: Grade I Tube type: Oral Tube size: 7.5 mm Number of attempts: 1 Airway Equipment and Method: Stylet Placement Confirmation: ETT inserted through vocal cords under direct vision, positive ETCO2, CO2 detector and breath sounds checked- equal and bilateral Secured at: 23 cm Tube secured with: Tape Dental Injury: Teeth and Oropharynx as per pre-operative assessment  Comments: Pt has very poor dentition.  Teeth notably rotten at base.  Teeth and lips intact post intubation.

## 2021-09-07 NOTE — Progress Notes (Signed)
Patient ID: Brian Martin, male   DOB: [birth year], 66 y.o.   MRN: [id number]  PROGRESS NOTE    Brian Martin  [name] DOB: 02-17-1956 DOA: 08/30/2021 PCP: Marco Collie, MD   Brief Narrative:  66 year old male currently a nursing home resident with history of cognitive decline, PVD status post left BKA, PAF on Eliquis, chronic diastolic CHF, IDDM, hypertension presented with frequent falls.  And right foot ulcer which has remained persistent since November 2022 despite a course of antibiotic treatment.  On admission, he was started on broad-spectrum antibiotics.  MRI of foot was negative for osteomyelitis.  ABI showed poor flow to the left right ankle.  Vascular surgery and cardiology were consulted.  Assessment & Plan:   Right foot ulcer Peripheral vascular disease status post left BKA -MRI of foot was negative for osteomyelitis. -ABI showed poor flow to the left right ankle. -Vascular surgery following: Underwent arteriogram on 09/02/2021.  Plan for bypass surgery today. -Currently on broad-spectrum antibiotics.  Will possibly DC antibiotics after the procedure after discussion with vascular surgery.  CAD -Cardiology following: Cardiac catheterization on 09/05/2021 showed no significant obstructive disease.  He has been cleared by for surgery by cardiology  Leukocytosis -Resolved  Chronic kidney disease stage IIIa  -Baseline creatinine 1.2-1.5.  Currently stable  Essential hypertension -continue Coreg  Hyperlipidemia -continue statin  Depression -continue Zoloft   Paroxysmal A. Fib -Currently rate controlled.  Continue Coreg. Eliquis on hold pending potential surgery   Chronic combined systolic and diastolic CHF -euvolemic, continue Coreg, furosemide.  Strict input and output.  Daily weights.  Fluid restriction.   Type 2 diabetes mellitus, with hyperglycemia -A1c 7.0.  Continue long-acting and mealtime insulin.  CBGs with good control as below  Generalized  conditioning Cognition decline -Overall condition has been getting worse recently.  Palliative care consultation for goals of care discussion.   DVT prophylaxis: SCDs Code Status: Full Family Communication: Wife at bedside Disposition Plan: Status is: Inpatient  Remains inpatient appropriate because: Of need for vascular surgery  Consultants: Vascular surgery/cardiology  Procedures: As above  Antimicrobials:  Anti-infectives (From admission, onward)    Start     Dose/Rate Route Frequency Ordered Stop   09/05/21 0000  [MAR Hold]  vancomycin (VANCOCIN) IVPB 1000 mg/200 mL premix        (MAR Hold since Wed 09/07/2021 at 0923.Hold Reason: Transfer to a Procedural area)   1,000 mg 200 mL/hr over 60 Minutes Intravenous Every 12 hours 09/04/21 1331     09/02/21 1515  [MAR Hold]  metroNIDAZOLE (FLAGYL) IVPB 500 mg        (MAR Hold since Wed 09/07/2021 at 0923.Hold Reason: Transfer to a Procedural area)   500 mg 100 mL/hr over 60 Minutes Intravenous Every 12 hours 09/02/21 1350     09/02/21 1445  [MAR Hold]  ceFEPIme (MAXIPIME) 2 g in sodium chloride 0.9 % 100 mL IVPB        (MAR Hold since Wed 09/07/2021 at 0923.Hold Reason: Transfer to a Procedural area)   2 g 200 mL/hr over 30 Minutes Intravenous Every 8 hours 09/02/21 1350     09/01/21 0800  vancomycin (VANCOREADY) IVPB 1750 mg/350 mL  Status:  Discontinued        1,750 mg 175 mL/hr over 120 Minutes Intravenous Every 24 hours 08/31/21 1332 09/04/21 1331   08/31/21 0800  vancomycin (VANCOREADY) IVPB 1750 mg/350 mL  Status:  Discontinued        1,750 mg 175 mL/hr over 120  Minutes Intravenous Every 12 hours 08/30/21 1750 08/31/21 1332   08/30/21 1745  vancomycin (VANCOREADY) IVPB 2000 mg/400 mL        2,000 mg 200 mL/hr over 120 Minutes Intravenous  Once 08/30/21 1738 08/30/21 2344   08/30/21 1745  piperacillin-tazobactam (ZOSYN) IVPB 3.375 g  Status:  Discontinued        3.375 g 12.5 mL/hr over 240 Minutes Intravenous Every 8 hours  08/30/21 1738 09/02/21 1350   08/30/21 1615  ceFAZolin (ANCEF) IVPB 1 g/50 mL premix  Status:  Discontinued        1 g 100 mL/hr over 30 Minutes Intravenous Every 8 hours 08/30/21 1607 08/30/21 1718   08/30/21 1130  cephALEXin (KEFLEX) capsule 500 mg        500 mg Oral  Once 08/30/21 1115 08/30/21 1148        Subjective: Patient seen and examined at bedside.  Wakes up slightly, poor historian.  No overnight fever, chest pain, nausea or vomiting reported.  Objective: Vitals:   09/07/21 0407 09/07/21 0800 09/07/21 0804 09/07/21 0929  BP: 133/72  135/72 (!) 155/78  Pulse: 71  73 70  Resp: 18  16 16   Temp: 98.4 F (36.9 C)  98.2 F (36.8 C) 98.1 F (36.7 C)  TempSrc: Oral  Oral Oral  SpO2: 96% 96% 96% 97%  Weight:      Height:        Intake/Output Summary (Last 24 hours) at 09/07/2021 1027 Last data filed at 09/07/2021 1000 Gross per 24 hour  Intake 1368.49 ml  Output 4825 ml  Net -3456.51 ml   Filed Weights   08/30/21 2121  Weight: 98.1 kg    Examination:  General exam: Appears calm and comfortable.  Looks chronically ill and deconditioned.  Currently on room air. Respiratory system: Bilateral decreased breath sounds at bases with scattered crackles Cardiovascular system: S1 & S2 heard, Rate controlled Gastrointestinal system: Abdomen is nondistended, soft and nontender. Normal bowel sounds heard. Extremities: No cyanosis, clubbing, edema.  Left BKA present. Central nervous system: Awake, extremely slow to respond.  Poor historian.  No focal neurological deficits. Moving extremities Skin: No obvious ecchymosis/rashes Psychiatry: Affect is mostly flat.  Does not participate in conversation much.   Data Reviewed: I have personally reviewed following labs and imaging studies  CBC: Recent Labs  Lab 09/01/21 0332 09/03/21 0158 09/05/21 0157 09/05/21 1136 09/05/21 1137 09/05/21 1142 09/06/21 0338 09/07/21 0448  WBC 11.2* 11.8* 11.9*  --   --   --  9.9 10.0   HGB 9.7* 9.5* 8.6* 9.2* 8.8* 8.8* 9.3* 10.2*  HCT 30.2* 29.7* 27.0* 27.0* 26.0* 26.0* 28.9* 30.6*  MCV 96.2 96.4 95.7  --   --   --  96.0 93.3  PLT 215 243 257  --   --   --  287 502   Basic Metabolic Panel: Recent Labs  Lab 09/03/21 0158 09/04/21 0149 09/05/21 0157 09/05/21 1136 09/05/21 1137 09/05/21 1142 09/06/21 0338 09/07/21 0448  NA 132* 135 133* 139 138 138 136 135  K 3.6 3.4* 3.8 4.0 4.0 4.1 3.9 3.7  CL 106 109 106  --   --   --  111 108  CO2 22 21* 19*  --   --   --  17* 20*  GLUCOSE 173* 134* 163*  --   --   --  175* 149*  BUN 21 20 21   --   --   --  21 19  CREATININE 1.10  1.31* 1.32*  --   --   --  1.19 1.17  CALCIUM 8.2* 8.1* 7.9*  --   --   --  7.9* 8.0*   GFR: Estimated Creatinine Clearance: 76.6 mL/min (by C-G formula based on SCr of 1.17 mg/dL). Liver Function Tests: Recent Labs  Lab 09/03/21 0158  AST 20  ALT 17  ALKPHOS 43  BILITOT 0.9  PROT 5.7*  ALBUMIN 2.3*   No results for input(s): LIPASE, AMYLASE in the last 168 hours. No results for input(s): AMMONIA in the last 168 hours. Coagulation Profile: No results for input(s): INR, PROTIME in the last 168 hours. Cardiac Enzymes: No results for input(s): CKTOTAL, CKMB, CKMBINDEX, TROPONINI in the last 168 hours. BNP (last 3 results) No results for input(s): PROBNP in the last 8760 hours. HbA1C: No results for input(s): HGBA1C in the last 72 hours. CBG: Recent Labs  Lab 09/06/21 1208 09/06/21 1653 09/06/21 2206 09/07/21 0616 09/07/21 0914  GLUCAP 203* 144* 139* 147* 140*   Lipid Profile: No results for input(s): CHOL, HDL, LDLCALC, TRIG, CHOLHDL, LDLDIRECT in the last 72 hours. Thyroid Function Tests: No results for input(s): TSH, T4TOTAL, FREET4, T3FREE, THYROIDAB in the last 72 hours. Anemia Panel: No results for input(s): VITAMINB12, FOLATE, FERRITIN, TIBC, IRON, RETICCTPCT in the last 72 hours. Sepsis Labs: No results for input(s): PROCALCITON, LATICACIDVEN in the last 168  hours.  Recent Results (from the past 240 hour(s))  Resp Panel by RT-PCR (Flu A&B, Covid) Nasopharyngeal Swab     Status: None   Collection Time: 08/30/21 11:47 AM   Specimen: Nasopharyngeal Swab; Nasopharyngeal(NP) swabs in vial transport medium  Result Value Ref Range Status   SARS Coronavirus 2 by RT PCR NEGATIVE NEGATIVE Final    Comment: (NOTE) SARS-CoV-2 target nucleic acids are NOT DETECTED.  The SARS-CoV-2 RNA is generally detectable in upper respiratory specimens during the acute phase of infection. The lowest concentration of SARS-CoV-2 viral copies this assay can detect is 138 copies/mL. A negative result does not preclude SARS-Cov-2 infection and should not be used as the sole basis for treatment or other patient management decisions. A negative result may occur with  improper specimen collection/handling, submission of specimen other than nasopharyngeal swab, presence of viral mutation(s) within the areas targeted by this assay, and inadequate number of viral copies(<138 copies/mL). A negative result must be combined with clinical observations, patient history, and epidemiological information. The expected result is Negative.  Fact Sheet for Patients:  EntrepreneurPulse.com.au  Fact Sheet for Healthcare Providers:  IncredibleEmployment.be  This test is no t yet approved or cleared by the [place] FDA and  has been authorized for detection and/or diagnosis of SARS-CoV-2 by FDA under an Emergency Use Authorization (EUA). This EUA will remain  in effect (meaning this test can be used) for the duration of the COVID-19 declaration under Section 564(b)(1) of the Act, 21 U.S.C.section 360bbb-3(b)(1), unless the authorization is terminated  or revoked sooner.       Influenza A by PCR NEGATIVE NEGATIVE Final   Influenza B by PCR NEGATIVE NEGATIVE Final    Comment: (NOTE) The Xpert Xpress SARS-CoV-2/FLU/RSV plus assay is intended  as an aid in the diagnosis of influenza from Nasopharyngeal swab specimens and should not be used as a sole basis for treatment. Nasal washings and aspirates are unacceptable for Xpert Xpress SARS-CoV-2/FLU/RSV testing.  Fact Sheet for Patients: EntrepreneurPulse.com.au  Fact Sheet for Healthcare Providers: IncredibleEmployment.be  This test is not yet approved or cleared by the [place]  States FDA and has been authorized for detection and/or diagnosis of SARS-CoV-2 by FDA under an Emergency Use Authorization (EUA). This EUA will remain in effect (meaning this test can be used) for the duration of the COVID-19 declaration under Section 564(b)(1) of the Act, 21 U.S.C. section 360bbb-3(b)(1), unless the authorization is terminated or revoked.  Performed at [facility], [place] [address] [place], [place] [address]   Blood culture (routine x 2)     Status: None   Collection Time: 08/30/21  4:34 PM   Specimen: BLOOD  Result Value Ref Range Status   Specimen Description BLOOD RIGHT ANTECUBITAL  Final   Special Requests   Final    BOTTLES DRAWN AEROBIC AND ANAEROBIC Blood Culture adequate volume   Culture   Final    NO GROWTH 5 DAYS Performed at [facility], [place] [address], [place], [place] [address]    Report Status 09/04/2021 FINAL  Final  Blood culture (routine x 2)     Status: None   Collection Time: 08/30/21  4:36 PM   Specimen: BLOOD  Result Value Ref Range Status   Specimen Description BLOOD LEFT ANTECUBITAL  Final   Special Requests   Final    BOTTLES DRAWN AEROBIC AND ANAEROBIC Blood Culture adequate volume   Culture   Final    NO GROWTH 5 DAYS Performed at [facility], [place] [address], [place], [place] [address]    Report Status 09/04/2021 FINAL  Final  MRSA Next Gen by PCR, Nasal     Status: Abnormal   Collection Time: 08/31/21  6:01 AM   Specimen: Nasal Mucosa; Nasal Swab  Result Value Ref Range Status   MRSA  by PCR Next Gen DETECTED (A) NOT DETECTED Final    Comment: RESULT CALLED TO, READ BACK BY AND VERIFIED WITH: Earl Lites RN, AT [phone] 08/31/21 D. VANHOOK (NOTE) The GeneXpert MRSA Assay (FDA approved for NASAL specimens only), is one component of a comprehensive MRSA colonization surveillance program. It is not intended to diagnose MRSA infection nor to guide or monitor treatment for MRSA infections. Test performance is not FDA approved in patients less than 72 years old. Performed at [facility], [place] [address], [place], [place] [address]          Radiology Studies: CARDIAC CATHETERIZATION  Result Date: 09/05/2021 Images from the original result were not included. Brian Martin is a 66 y.o. male  [id number] LOCATION:  FACILITY: [facility] PHYSICIAN: Quay Burow, M.D. [birth year] DATE OF PROCEDURE:  09/05/2021 DATE OF DISCHARGE: CARDIAC CATHETERIZATION History obtained from chart review.66 y.o. male with severe peripheral vascular disease awaiting right lower extremity arterial bypass with prior left BKA.  Recent chest CT demonstrates quite significant coronary artery calcification.  In addition, there was calcification on the pericardium noted on CT.  The patient presents today for right left heart cath for preoperative clearance before peripheral vascular surgery.   Mr. Dollens has a left dominant system with no obstructive disease.  His filling pressures were fairly normal.  Cardiac output was 6.6 L/min with an index of 3 L/min/m and his LVEDP was 14.  He is a low risk for undergoing vascular surgery.  The radial sheath was removed and a TR band was placed on the right wrist to achieve patent hemostasis.  The patient left lab in stable condition.  Dr. Johnsie Cancel, the patient's attending cardiologist, was notified of these results. Quay Burow. MD, [facility] 09/05/2021 12:10 PM  Scheduled Meds:  [MAR Hold] aspirin EC  81 mg Oral Daily   [MAR Hold] carvedilol  25 mg Oral BID WC   [MAR  Hold] docusate sodium  100 mg Oral BID   [MAR Hold] furosemide  20 mg Oral Daily   [MAR Hold] guaiFENesin  1,200 mg Oral BID   [MAR Hold] insulin aspart  0-15 Units Subcutaneous TID WC   [MAR Hold] insulin aspart  3 Units Subcutaneous TID WC   [MAR Hold] insulin glargine-yfgn  25 Units Subcutaneous q morning   [MAR Hold] mometasone-formoterol  2 puff Inhalation BID   [MAR Hold] pantoprazole  40 mg Oral BID AC   [MAR Hold] rosuvastatin  20 mg Oral Daily   [MAR Hold] sertraline  25 mg Oral Daily   [MAR Hold] sodium chloride flush  3 mL Intravenous Q12H   [MAR Hold] sodium chloride flush  3 mL Intravenous Q12H   Continuous Infusions:  [MAR Hold] sodium chloride     [MAR Hold] sodium chloride     [MAR Hold] ceFEPime (MAXIPIME) IV 2 g (09/07/21 0605)   lactated ringers 10 mL/hr at 09/07/21 0949   [MAR Hold] metronidazole 500 mg (09/07/21 0325)   [MAR Hold] vancomycin 1,000 mg (09/07/21 0106)          Aline August, MD [facility] 09/07/2021, 10:27 AM

## 2021-09-07 NOTE — Interval H&P Note (Signed)
History and Physical Interval Note:  09/07/2021 10:30 AM  Brian Martin  has presented today for surgery, with the diagnosis of Claudication.  The various methods of treatment have been discussed with the patient and family. After consideration of risks, benefits and other options for treatment, the patient has consented to  Procedure(s): BYPASS GRAFT RIGHT FEMORAL-POPLITEAL ARTERY (Right) as a surgical intervention.  The patient's history has been reviewed, patient examined, no change in status, stable for surgery.  I have reviewed the patient's chart and labs.  Questions were answered to the patient's satisfaction.     [name]

## 2021-09-08 DIAGNOSIS — R4189 Other symptoms and signs involving cognitive functions and awareness: Secondary | ICD-10-CM

## 2021-09-08 DIAGNOSIS — Z01818 Encounter for other preprocedural examination: Secondary | ICD-10-CM | POA: Diagnosis not present

## 2021-09-08 LAB — GLUCOSE, CAPILLARY
Glucose-Capillary: 155 mg/dL — ABNORMAL HIGH (ref 70–99)
Glucose-Capillary: 158 mg/dL — ABNORMAL HIGH (ref 70–99)
Glucose-Capillary: 166 mg/dL — ABNORMAL HIGH (ref 70–99)
Glucose-Capillary: 90 mg/dL (ref 70–99)

## 2021-09-08 LAB — CBC
HCT: 24.6 % — ABNORMAL LOW (ref 39.0–52.0)
Hemoglobin: 8.1 g/dL — ABNORMAL LOW (ref 13.0–17.0)
MCH: 31.2 pg (ref 26.0–34.0)
MCHC: 32.9 g/dL (ref 30.0–36.0)
MCV: 94.6 fL (ref 80.0–100.0)
Platelets: 300 10*3/uL (ref 150–400)
RBC: 2.6 MIL/uL — ABNORMAL LOW (ref 4.22–5.81)
RDW: 12.6 % (ref 11.5–15.5)
WBC: 12.5 10*3/uL — ABNORMAL HIGH (ref 4.0–10.5)
nRBC: 0 % (ref 0.0–0.2)

## 2021-09-08 LAB — BASIC METABOLIC PANEL
Anion gap: 6 (ref 5–15)
BUN: 20 mg/dL (ref 8–23)
CO2: 20 mmol/L — ABNORMAL LOW (ref 22–32)
Calcium: 7.9 mg/dL — ABNORMAL LOW (ref 8.9–10.3)
Chloride: 107 mmol/L (ref 98–111)
Creatinine, Ser: 1.1 mg/dL (ref 0.61–1.24)
GFR, Estimated: >60 mL/min (ref >60)
Glucose, Bld: 190 mg/dL — ABNORMAL HIGH (ref 70–99)
Potassium: 3.7 mmol/L (ref 3.5–5.1)
Sodium: 133 mmol/L — ABNORMAL LOW (ref 135–145)

## 2021-09-08 NOTE — Anesthesia Postprocedure Evaluation (Signed)
Anesthesia Post Note  Patient: Brian Martin [facility]  Procedure(s) Performed: BYPASS GRAFT RIGHT FEMORAL-POPLITEAL ARTERY WITH VEIN (Right) INTRA OPERATIVE ARTERIOGRAM (Right) ENDARTERECTOMY FEMORAL (Right) ULTRASOUND GUIDANCE FOR VASCULAR ACCESS LEFT FEMORAL ARTERY (Left)     Patient location during evaluation: PACU Anesthesia Type: General Level of consciousness: sedated and patient cooperative Pain management: pain level controlled Vital Signs Assessment: post-procedure vital signs reviewed and stable Respiratory status: spontaneous breathing Cardiovascular status: stable Anesthetic complications: no   No notable events documented.  Last Vitals:  Vitals:   09/08/21 0324 09/08/21 0800  BP: (!) 117/58 133/65  Pulse: 72 75  Resp: 15 20  Temp: 36.7 C 36.7 C  SpO2: 95% 96%    Last Pain:  Vitals:   09/08/21 0800  TempSrc: Oral  PainSc:                  Nolon Nations

## 2021-09-08 NOTE — Progress Notes (Signed)
Physical Therapy Treatment Patient Details Name: Brian Martin MRN: [id number] DOB: February 28, 1956 Today's Date: 09/08/2021   History of Present Illness Pt is a 66 y.o. male admitted from SNF on 08/30/21 after fall; found to have infection from R foot ulcer and cognitive decline. S/p on R femoral popliteal bypass graft 1/11.  PMH includes L BKA, CHF, PAF, IDDM, HTN.   PT Comments    Pt progressing with mobility, now s/p RLE revascularization. Today's session focused on seated EOB activity and standing trials; pt unable to stand fully on RLE despite maxA+2. Wife present and reports low likelihood she will be able to retrieve pt's LLE prosthetic from SNF while admitted here; therefore, will likely need to focus efforts on w/c-level transfer training. Recommend continued SNF-level therapies to maximize functional mobility and independence; pt hopeful to eventually move to daughter's home.    Recommendations for follow up therapy are one component of a multi-disciplinary discharge planning process, led by the attending physician.  Recommendations may be updated based on patient status, additional functional criteria and insurance authorization.  Follow Up Recommendations  Skilled nursing-short term rehab (<3 hours/day)     Assistance Recommended at Discharge Frequent or constant Supervision/Assistance  Patient can return home with the following     Equipment Recommendations  None recommended by PT    Recommendations for Other Services       Precautions / Restrictions Precautions Precautions: Fall;Other (comment) Precaution Comments: h/o L BKA (does not have prosthetic in room) Restrictions Weight Bearing Restrictions: No     Mobility  Bed Mobility Overal bed mobility: Needs Assistance Bed Mobility: Supine to Sit;Sit to Supine     Supine to sit: Mod assist;+2 for physical assistance;HOB elevated Sit to supine: Min assist   General bed mobility comments: Mod A x 2 to lift trunk  successfully, fully scoot hips EOB and step by step cues to sequence task. able to guide trunk to bed using bedrails, light assist for LEs    Transfers Overall transfer level: Needs assistance Equipment used: 2 person hand held assist Transfers: Sit to/from Stand Sit to Stand: Max assist;+2 physical assistance           General transfer comment: Attempted standing at bedside with Max A x 2 needed to clear bottom and unable to fully come to standing, attempting squat/scooting towards [facility] with Max A x 2 needed and pt difficulty with follow-through    Ambulation/Gait                   Stairs             Wheelchair Mobility    Modified Rankin (Stroke Patients Only)       Balance Overall balance assessment: Needs assistance Sitting-balance support: No upper extremity supported;Feet unsupported;Bilateral upper extremity supported Sitting balance-Leahy Scale: Fair Sitting balance - Comments: required min guard throughout with anterior leaning, likely due to fatigue rather than LOB; repeated cues to encourage pt to sit trunk upright; maxA to don R sock   Standing balance support: Bilateral upper extremity supported Standing balance-Leahy Scale: Zero                              Cognition Arousal/Alertness: Awake/alert Behavior During Therapy: Flat affect Overall Cognitive Status: Impaired/Different from baseline Area of Impairment: Attention;Memory;Following commands;Safety/judgement;Awareness;Problem solving                   Current Attention Level: Sustained;Selective  Memory: Decreased short-term memory Following Commands: Follows one step commands with increased time Safety/Judgement: Decreased awareness of deficits;Decreased awareness of safety Awareness: Intellectual Problem Solving: Slow processing;Difficulty sequencing;Requires verbal cues;Requires tactile cues;Decreased initiation General Comments: Flat affect, slow processing and  difficulty recalling PLOF at times. requires multimodal cues for successful/safe sequencing of tasks with questionable motor planning deficits. Pt's wife present but did not appear overally concerned with pt mentation        Exercises      General Comments General comments (skin integrity, edema, etc.): VSS on RA. wife present with desire to provide hands on assist and cueing to assist pt during tasks. Wife reports pt's prosthetic is at [facility] rehab and she likely will not be able to go get it (was hopeful SNF staff would bring it here though educated this likely not feasible)      Pertinent Vitals/Pain Pain Assessment: Faces Faces Pain Scale: Hurts little more Pain Location: R foot Pain Descriptors / Indicators: Discomfort;Grimacing;Guarding;Sore Pain Intervention(s): Monitored during session;Limited activity within patient's tolerance;Premedicated before session    [place] expects to be discharged to:: Skilled nursing facility                   Additional Comments: reports living there for past 2-3 months but unable to recall name of facility.    Prior Function            PT Goals (current goals can now be found in the care plan section) Progress towards PT goals: Progressing toward goals (slowly)    Frequency    Min 2X/week      PT Plan Current plan remains appropriate    Co-evaluation   Reason for Co-Treatment: Necessary to address cognition/behavior during functional activity;For patient/therapist safety;To address functional/ADL transfers   OT goals addressed during session: ADL's and self-care      AM-PAC PT "6 Clicks" Mobility   Outcome Measure  Help needed turning from your back to your side while in a flat bed without using bedrails?: A Lot Help needed moving from lying on your back to sitting on the side of a flat bed without using bedrails?: A Lot Help needed moving to and from a bed to a chair (including a wheelchair)?:  Total Help needed standing up from a chair using your arms (e.g., wheelchair or bedside chair)?: Total Help needed to walk in hospital room?: Total Help needed climbing 3-5 steps with a railing? : Total 6 Click Score: 8    End of Session Equipment Utilized During Treatment: Gait belt Activity Tolerance: Patient tolerated treatment well;Patient limited by fatigue;Patient limited by pain Patient left: in bed;with call bell/phone within reach;with bed alarm set;with family/visitor present Nurse Communication: Mobility status;Need for lift equipment PT Visit Diagnosis: Unsteadiness on feet (R26.81);Other abnormalities of gait and mobility (R26.89);Repeated falls (R29.6);Muscle weakness (generalized) (M62.81);History of falling (Z91.81) Pain - Right/Left: Right Pain - part of body: Leg     Time: 1610-9604 PT Time Calculation (min) (ACUTE ONLY): 26 min  Charges:  $Therapeutic Activity: 8-22 mins                     Mabeline Caras, PT, DPT Acute Rehabilitation Services  Pager [phone] Office [place] 09/08/2021, 12:33 PM

## 2021-09-08 NOTE — Progress Notes (Signed)
Progress Note  Patient Name: Brian Martin Date of Encounter: 09/08/2021  [facility] Cardiologist:  Marlou Porch   Subjective   No chest pain   Inpatient Medications    Scheduled Meds:  aspirin EC  81 mg Oral Daily   carvedilol  25 mg Oral BID WC   Chlorhexidine Gluconate Cloth  6 each Topical Q0600   docusate sodium  100 mg Oral BID   guaiFENesin  1,200 mg Oral BID   heparin  5,000 Units Subcutaneous Q8H   insulin aspart  0-15 Units Subcutaneous TID WC   insulin aspart  3 Units Subcutaneous TID WC   insulin glargine-yfgn  25 Units Subcutaneous q morning   mometasone-formoterol  2 puff Inhalation BID   pantoprazole  40 mg Oral BID AC   rosuvastatin  20 mg Oral Daily   sertraline  25 mg Oral Daily   sodium chloride flush  3 mL Intravenous Q12H   Continuous Infusions:  sodium chloride     sodium chloride     sodium chloride Stopped (09/08/21 0827)   ceFEPime (MAXIPIME) IV Stopped (09/08/21 4098)   metronidazole Stopped (09/08/21 0427)   vancomycin Stopped (09/08/21 0042)   PRN Meds: sodium chloride, sodium chloride, acetaminophen, albuterol, hydrALAZINE, labetalol, LORazepam, metoprolol tartrate, morphine injection, ondansetron (ZOFRAN) IV, oxyCODONE-acetaminophen, sodium chloride flush   Vital Signs    Vitals:   09/07/21 2200 09/07/21 2332 09/08/21 0324 09/08/21 0800  BP: (!) 155/69 (!) 144/72 (!) 117/58 133/65  Pulse: 82 83 72 75  Resp: 18 16 15 20   Temp: 97.6 F (36.4 C) 98.2 F (36.8 C) 98 F (36.7 C) 98 F (36.7 C)  TempSrc: Oral Oral Axillary Oral  SpO2: 99% 95% 95% 96%  Weight:      Height:        Intake/Output Summary (Last 24 hours) at 09/08/2021 0829 Last data filed at 09/08/2021 0651 Gross per 24 hour  Intake 4586.77 ml  Output 2510 ml  Net 2076.77 ml   Last 3 Weights 08/30/2021 12/02/2018 11/21/2017  Weight (lbs) 216 lb 4.3 oz 225 lb 231 lb  Weight (kg) 98.1 kg 102.059 kg 104.781 kg      Telemetry  NSR rare PVC;s 09/08/2021   ECG     Sinus rhythm left axis deviation- Personally Reviewed  Physical Exam   Chronically ill pale male Lungs clear No murmur  Post left BKA PVD RLE with decreased pulse DP marked  No edema  Neuro non focal  Right radial cath site A  Post right fem pop surgery   Labs    High Sensitivity Troponin:   Recent Labs  Lab 08/30/21 1213 08/30/21 1440  TROPONINIHS 23* 22*     Chemistry Recent Labs  Lab 09/03/21 0158 09/04/21 0149 09/06/21 0338 09/07/21 0448 09/07/21 1312 09/07/21 1444 09/08/21 0315  NA 132*   < > 136 135 136 137 133*  K 3.6   < > 3.9 3.7 4.4 5.0 3.7  CL 106   < > 111 108 105  --  107  CO2 22   < > 17* 20*  --   --  20*  GLUCOSE 173*   < > 175* 149* 195*  --  190*  BUN 21   < > 21 19 18   --  20  CREATININE 1.10   < > 1.19 1.17 1.00  --  1.10  CALCIUM 8.2*   < > 7.9* 8.0*  --   --  7.9*  PROT 5.7*  --   --   --   --   --   --  ALBUMIN 2.3*  --   --   --   --   --   --   AST 20  --   --   --   --   --   --   ALT 17  --   --   --   --   --   --   ALKPHOS 43  --   --   --   --   --   --   BILITOT 0.9  --   --   --   --   --   --   GFRNONAA >60   < > >60 >60  --   --  >60  ANIONGAP 4*   < > 8 7  --   --  6   < > = values in this interval not displayed.    Lipids No results for input(s): CHOL, TRIG, HDL, LABVLDL, LDLCALC, CHOLHDL in the last 168 hours.  Hematology Recent Labs  Lab 09/06/21 0338 09/07/21 0448 09/07/21 1312 09/07/21 1444 09/08/21 0315  WBC 9.9 10.0  --   --  12.5*  RBC 3.01* 3.28*  --   --  2.60*  HGB 9.3* 10.2* 10.9* 10.5* 8.1*  HCT 28.9* 30.6* 32.0* 31.0* 24.6*  MCV 96.0 93.3  --   --  94.6  MCH 30.9 31.1  --   --  31.2  MCHC 32.2 33.3  --   --  32.9  RDW 12.9 12.7  --   --  12.6  PLT 287 314  --   --  300   Thyroid  No results for input(s): TSH, FREET4 in the last 168 hours.   BNP No results for input(s): BNP, PROBNP in the last 168 hours.   DDimer No results for input(s): DDIMER in the last 168 hours.   Radiology    No  results found.  Cardiac Studies   Prior CT of chest reviewed-coronary calcification noted  ECHO 09/03/21:   1. Left ventricular ejection fraction, by estimation, is 55 to 60%. The  left ventricle has normal function. The left ventricle has no regional  wall motion abnormalities. Left ventricular diastolic parameters were  normal.   2. Right ventricular systolic function is normal. The right ventricular  size is normal.   3. Left atrial size was mildly dilated.   4. The mitral valve is normal in structure. No evidence of mitral valve  regurgitation. No evidence of mitral stenosis.   5. The aortic valve is normal in structure. Aortic valve regurgitation is  not visualized. No aortic stenosis is present.   6. The inferior vena cava is dilated in size with <50% respiratory  variability, suggesting right atrial pressure of 15 mmHg.   Patient Profile     66 y.o. male with severe peripheral vascular disease awaiting right lower extremity arterial bypass with prior left BKA.  Recent chest CT demonstrates quite significant coronary artery calcification. Cath 09/05/21 no obstructive CAD   Assessment & Plan    Preoperative risk assessment - No obstructive CAD on cath 09/05/21 low risk for vascular surgery Filling pressures ok no evidence of constriction mean PCWP only 14 mmHg Day one post op with no cardiac complications   Coronary artery calcification - Left main RCA LAD noted on chest CT. - Started aspirin 81 mg  Hyperlipidemia - Now on crestor 20 mg daily   Mildly elevated troponin - Flat, demand ischemia in the setting of his underlying vascular disease.  Chronic  kidney disease stage IIIa - Creatinine 1.17 this am   Osteomyelitis - IV cefepime, metronidazole, vancomycin.  Stable  Cardiology will sign off      Signed, Jenkins Rouge, MD  09/08/2021, 8:29 AM

## 2021-09-08 NOTE — Progress Notes (Signed)
Pharmacy Antibiotic Note  Brian Martin is a 66 y.o. male admitted on 08/30/2021 with right foot ulcer. He presented to the ED with altered mental status and after a fall. Pharmacy has been consulted for vancomycin and Zosyn dosing.  Underwent arteriogram 1/06 and above-knee bypass on 1/11. No osteo or abscess noted on MRI. D10 of antibiotics total. Cefepime/Flagyl to minimize AKI risk. No growth on cultures (final).  Afebrile. WBC is stable 12. SCr 1.1.  May be able to transition off antibiotics soon, so holding off on levels at this time.   Plan: Continue vancomycin 1000 mg every 12 hours Continue Cefepime + Flagyl F/u LOT now s/p bypass Monitor for signs of clinical improvement Monitor renal function  Temp (24hrs), Avg:97.9 F (36.6 C), Min:97 F (36.1 C), Max:98.2 F (36.8 C)  Recent Labs  Lab 09/03/21 0158 09/04/21 0149 09/05/21 0157 09/06/21 0338 09/07/21 0448 09/07/21 1312 09/08/21 0315  WBC 11.8*  --  11.9* 9.9 10.0  --  12.5*  CREATININE 1.10   < > 1.32* 1.19 1.17 1.00 1.10   < > = values in this interval not displayed.     Estimated Creatinine Clearance: 81.5 mL/min (by C-G formula based on SCr of 1.1 mg/dL).    No Known Allergies  Antimicrobials this admission: 09/02/21 Cefepime >> 09/02/21 Flagyl >> 08/30/21 Vancomycin >>  08/30/21 Zosyn >> 09/01/21 08/30/21 Cephalexin x 1 08/30/21 Cefazolin x 1   Microbiology results: 1/3 BCx: NGF            MRSA PCR: positive  Thank you for involving pharmacy in this patient's care.  Erin Hearing PharmD., BCPS Clinical Pharmacist 09/08/2021 10:16 AM

## 2021-09-08 NOTE — TOC Progression Note (Signed)
Transition of Care [facility]) - Progression Note    Patient Details  Name: AWAIS COBARRUBIAS MRN: [id number] Date of Birth: 1956-04-01  Transition of Care [facility]) CM/SW [place], [place] Phone Number: 09/08/2021, 3:50 PM  Clinical Narrative:     CSW met with patient and his spouse- family remains agreeable to SNF-[facility].   Contacted SNF- [facility] confirmed bed offer.  Thurmond Butts, MSW, LCSW Clinical Social Worker    Expected Discharge Plan: Skilled Nursing Facility Barriers to Discharge: Continued Medical Work up, SNF Pending bed offer  Expected Discharge Plan and Services Expected Discharge Plan: [place] Choice: [place] arrangements for the past 2 months: [facility] in [place])                                       Social Determinants of Health (SDOH) Interventions    Readmission Risk Interventions No flowsheet data found.

## 2021-09-08 NOTE — Care Management Important Message (Signed)
Important Message  Patient Details  Name: Brian Martin MRN: [id number] Date of Birth: Jun 25, 1956   Medicare Important Message Given:  Yes     Shelda Altes 09/08/2021, 7:53 AM

## 2021-09-08 NOTE — Evaluation (Signed)
Occupational Therapy Evaluation Patient Details Name: Brian Martin MRN: [id number] DOB: Dec 09, 1955 Today's Date: 09/08/2021   History of Present Illness Pt is a 66 y/o M presenting from skilled nursing facility s/p fall with found infection from R foot ulcer and cognitive decline. Pt scheduled for vascular intervention of RLE 1/11.  PMH includes L BKA, CHF, PAF, IDDM, and HTN.   Clinical Impression   PTA, pt admitted from SNF rehab with diagnoses above. Pt presents with deficits in cognition, strength, balance, endurance and pain. Pt limited in progressing standing attempts d/t not having L LE prosthetic to assist in offloading painful R LE. Pt requires Max A x 2 to clear bottom from bed though unable to stand fully upright today. Pt requires Min A for UB ADLs and Total A for LB ADLs via bed level/lateral leans. Pt also with flat affect, slower processing and benefits from step by step cues for sequencing tasks. Pt's wife present and supportive. Recommend maximove for nursing staff for any transfers OOB. Recommend DC back to SNF. Will continue to follow acutely.       Recommendations for follow up therapy are one component of a multi-disciplinary discharge planning process, led by the attending physician.  Recommendations may be updated based on patient status, additional functional criteria and insurance authorization.   Follow Up Recommendations  Skilled nursing-short term rehab (<3 hours/day)    Assistance Recommended at Discharge Frequent or constant Supervision/Assistance  Patient can return home with the following Two people to help with walking and/or transfers;A lot of help with bathing/dressing/bathroom    Functional Status Assessment  Patient has had a recent decline in their functional status and demonstrates the ability to make significant improvements in function in a reasonable and predictable amount of time.  Equipment Recommendations  Wheelchair (measurements OT);Wheelchair  cushion (measurements OT);Hospital bed    Recommendations for Other Services       Precautions / Restrictions Precautions Precautions: Fall Precaution Comments: L BKA (does not have prosthetic in room) Restrictions Weight Bearing Restrictions: No      Mobility Bed Mobility Overal bed mobility: Needs Assistance Bed Mobility: Supine to Sit;Sit to Supine     Supine to sit: Mod assist;+2 for physical assistance;HOB elevated Sit to supine: Min assist   General bed mobility comments: Mod A x 2 to lift trunk successfully, fully scoot hips EOB and step by step cues to sequence task. able to guide trunk to bed using bedrails, light assist for LEs    Transfers Overall transfer level: Needs assistance Equipment used: 2 person hand held assist               General transfer comment: Attempted standing at bedside with Max A x 2 needed to clear bottom and unable to fully come to standing, attempting squat/scooting towards [facility] with Max A x 2 needed and pt difficulty with follow-through      Balance Overall balance assessment: Needs assistance Sitting-balance support: No upper extremity supported;Feet unsupported;Bilateral upper extremity supported Sitting balance-Leahy Scale: Fair Sitting balance - Comments: required min guard throughout with anterior leaning, likely due to fatigue rather than LOB   Standing balance support: Bilateral upper extremity supported Standing balance-Leahy Scale: Zero                             ADL either performed or assessed with clinical judgement   ADL Overall ADL's : Needs assistance/impaired Eating/Feeding: Set up;Sitting Eating/Feeding Details (indicate cue  type and reason): assist to open containers, cut up food Grooming: Supervision/safety;Sitting   Upper Body Bathing: Minimal assistance;Sitting Upper Body Bathing Details (indicate cue type and reason): assist to bathe back Lower Body Bathing: Total assistance;Sitting/lateral  leans;Bed level   Upper Body Dressing : Minimal assistance;Sitting   Lower Body Dressing: Total assistance;Sitting/lateral leans;Bed level       Toileting- Clothing Manipulation and Hygiene: Total assistance;Bed level         General ADL Comments: pt limited by R LE pain, not having L prosthetic to utilize to offload pressure or progress standing. Pt also with flat affect, cognitive impairments requiring extensive assist for LB ADLs.     Vision Ability to See in Adequate Light: 0 Adequate Patient Visual Report: No change from baseline Vision Assessment?: No apparent visual deficits     Perception     Praxis      Pertinent Vitals/Pain Pain Assessment: Faces Faces Pain Scale: Hurts little more Pain Location: R foot Pain Descriptors / Indicators: Discomfort;Grimacing;Guarding;Sore Pain Intervention(s): Monitored during session;Limited activity within patient's tolerance     Hand Dominance Left   Extremity/Trunk Assessment Upper Extremity Assessment Upper Extremity Assessment: Generalized weakness   Lower Extremity Assessment Lower Extremity Assessment: Defer to PT evaluation   Cervical / Trunk Assessment Cervical / Trunk Assessment: Kyphotic   Communication Communication Communication: No difficulties   Cognition Arousal/Alertness: Awake/alert Behavior During Therapy: Flat affect Overall Cognitive Status: Impaired/Different from baseline Area of Impairment: Attention;Memory;Following commands;Safety/judgement;Awareness;Problem solving                   Current Attention Level: Selective Memory: Decreased short-term memory Following Commands: Follows one step commands with increased time Safety/Judgement: Decreased awareness of deficits;Decreased awareness of safety Awareness: Intellectual Problem Solving: Slow processing;Difficulty sequencing;Requires verbal cues;Requires tactile cues;Decreased initiation General Comments: Flat affect, slow processing  and difficulty recalling PLOF at times. requires multimodal cues for successful/safe sequencing of tasks with questionable motor planning deficits. Pt's wife present but did not appear overally concerned with pt mentation     General Comments  VSS on RA. wife present with desire to provide hands on assist and cueing to assist pt during tasks. Wife reports pt's prosthetic is at [facility] rehab and she likely will not be able to go get it (was hopeful SNF staff would bring it here though educated this likely not feasible)    Exercises     Shoulder Instructions      Home Living Family/patient expects to be discharged to:: Skilled nursing facility                                 Additional Comments: reports living there for past 2-3 months but unable to recall name of facility.      Prior Functioning/Environment Prior Level of Function : Needs assist       Physical Assist : Mobility (physical);ADLs (physical)     Mobility Comments: subjective history unclear. Pt initally reported not using any DME prior to admission, then reported using a cane. ADLs Comments: Per wife, pt was completing ADLs with limited assist vs no assist; unsure of accuracy        OT Problem List: Decreased strength;Decreased activity tolerance;Impaired balance (sitting and/or standing);Decreased coordination;Decreased cognition;Decreased safety awareness;Decreased knowledge of use of DME or AE;Decreased knowledge of precautions;Pain      OT Treatment/Interventions: Self-care/ADL training;Therapeutic exercise;Energy conservation;DME and/or AE instruction;Therapeutic activities;Patient/family education;Balance training    OT  Goals(Current goals can be found in the care plan section) Acute Rehab OT Goals Patient Stated Goal: decrease pain OT Goal Formulation: With patient Time For Goal Achievement: 09/22/21 Potential to Achieve Goals: Good ADL Goals Pt Will Perform Upper Body Bathing: with  set-up;sitting Pt Will Perform Lower Body Bathing: with mod assist;sitting/lateral leans;bed level Pt Will Perform Lower Body Dressing: with mod assist;sitting/lateral leans;bed level Pt/caregiver will Perform Home Exercise Program: Increased strength;Both right and left upper extremity;With theraband;Independently;With Supervision;With written HEP provided Additional ADL Goal #1: Pt to demo ability to maintain balance EOB during functional tasks > 7 min with no more than Supervision  OT Frequency: Min 2X/week    Co-evaluation PT/OT/SLP Co-Evaluation/Treatment: Yes Reason for Co-Treatment: Necessary to address cognition/behavior during functional activity;For patient/therapist safety;To address functional/ADL transfers   OT goals addressed during session: ADL's and self-care      AM-PAC OT "6 Clicks" Daily Activity     Outcome Measure Help from another person eating meals?: A Little Help from another person taking care of personal grooming?: A Little Help from another person toileting, which includes using toliet, bedpan, or urinal?: Total Help from another person bathing (including washing, rinsing, drying)?: A Lot Help from another person to put on and taking off regular upper body clothing?: A Little Help from another person to put on and taking off regular lower body clothing?: Total 6 Click Score: 13   End of Session Equipment Utilized During Treatment: Gait belt  Activity Tolerance: Patient limited by fatigue;Patient limited by pain;Other (comment) (not having L LE prosthetic) Patient left: in bed;with call bell/phone within reach;with bed alarm set;with family/visitor present  OT Visit Diagnosis: Unsteadiness on feet (R26.81);Other abnormalities of gait and mobility (R26.89);Muscle weakness (generalized) (M62.81);Pain;Other symptoms and signs involving cognitive function Pain - Right/Left: Right Pain - part of body: Ankle and joints of foot                Time: 4076-8088 OT  Time Calculation (min): 25 min Charges:  OT General Charges $OT Visit: 1 Visit OT Evaluation $OT Eval Moderate Complexity: 1 Mod  Malachy Chamber, OTR/L Acute Rehab Services Office: [phone]   Layla Maw 09/08/2021, 11:21 AM

## 2021-09-08 NOTE — Progress Notes (Signed)
Patient ID: Brian Martin, male   DOB: 04/16/1956, 66 y.o.   MRN: [id number]  PROGRESS NOTE    YOUSSOUF SHIPLEY  YSA:[id number] DOB: October 10, 1955 DOA: 08/30/2021 PCP: Marco Collie, MD   Brief Narrative:  66 year old male currently a nursing home resident with history of cognitive decline, PVD status post left BKA, PAF on Eliquis, chronic diastolic CHF, IDDM, hypertension presented with frequent falls.  And right foot ulcer which has remained persistent since November 2022 despite a course of antibiotic treatment.  On admission, he was started on broad-spectrum antibiotics.  MRI of foot was negative for osteomyelitis.  ABI showed poor flow to the left right ankle.  Vascular surgery and cardiology were consulted.  He underwent right femoral to above-knee popliteal artery bypass graft on 09/07/2021.  Assessment & Plan:   Right foot ulcer Peripheral vascular disease status post left BKA -MRI of foot was negative for osteomyelitis. -ABI showed poor flow to the left right ankle. -Vascular surgery following: Underwent arteriogram on 09/02/2021.   underwent right femoral to above-knee popliteal artery bypass graft on 09/07/2021 -Currently on broad-spectrum antibiotics.  Will possibly DC antibiotics if okay with vascular surgery.  CAD -Cardiology following: Cardiac catheterization on 09/05/2021 showed no significant obstructive disease.  He has been cleared by for surgery by cardiology  Leukocytosis -Mild.  Monitor intermittently.  Chronic kidney disease stage IIIa  -Baseline creatinine 1.2-1.5.  Currently stable  Essential hypertension -continue Coreg  Hyperlipidemia -continue statin  Depression -continue Zoloft   Paroxysmal A. Fib -Currently rate controlled.  Continue Coreg. Eliquis on hold: Will resume only once cleared by vascular surgery  Chronic combined systolic and diastolic CHF -euvolemic, continue Coreg, furosemide.  Strict input and output.  Daily weights.  Fluid restriction.   Type  2 diabetes mellitus, with hyperglycemia -A1c 7.0.  Continue long-acting and mealtime insulin.  Continue CBGs with SSI.  Generalized conditioning Cognition decline -Overall condition has been getting worse recently.  Palliative care consultation for goals of care discussion.   DVT prophylaxis: SCDs Code Status: Full Family Communication: Wife at bedside  Disposition Plan: Status is: Inpatient  Remains inpatient appropriate because: Will need SNF placement once cleared by vascular surgery  Consultants: Vascular surgery/cardiology.  Palliative care  Procedures: As above  Antimicrobials:  Anti-infectives (From admission, onward)    Start     Dose/Rate Route Frequency Ordered Stop   09/07/21 1800  ceFAZolin (ANCEF) IVPB 2g/100 mL premix  Status:  Discontinued        2 g 200 mL/hr over 30 Minutes Intravenous Every 8 hours 09/07/21 1747 09/07/21 2152   09/05/21 0000  vancomycin (VANCOCIN) IVPB 1000 mg/200 mL premix        1,000 mg 200 mL/hr over 60 Minutes Intravenous Every 12 hours 09/04/21 1331     09/02/21 1515  metroNIDAZOLE (FLAGYL) IVPB 500 mg        500 mg 100 mL/hr over 60 Minutes Intravenous Every 12 hours 09/02/21 1350     09/02/21 1445  ceFEPIme (MAXIPIME) 2 g in sodium chloride 0.9 % 100 mL IVPB        2 g 200 mL/hr over 30 Minutes Intravenous Every 8 hours 09/02/21 1350     09/01/21 0800  vancomycin (VANCOREADY) IVPB 1750 mg/350 mL  Status:  Discontinued        1,750 mg 175 mL/hr over 120 Minutes Intravenous Every 24 hours 08/31/21 1332 09/04/21 1331   08/31/21 0800  vancomycin (VANCOREADY) IVPB 1750 mg/350 mL  Status:  Discontinued  1,750 mg 175 mL/hr over 120 Minutes Intravenous Every 12 hours 08/30/21 1750 08/31/21 1332   08/30/21 1745  vancomycin (VANCOREADY) IVPB 2000 mg/400 mL        2,000 mg 200 mL/hr over 120 Minutes Intravenous  Once 08/30/21 1738 08/30/21 2344   08/30/21 1745  piperacillin-tazobactam (ZOSYN) IVPB 3.375 g  Status:  Discontinued         3.375 g 12.5 mL/hr over 240 Minutes Intravenous Every 8 hours 08/30/21 1738 09/02/21 1350   08/30/21 1615  ceFAZolin (ANCEF) IVPB 1 g/50 mL premix  Status:  Discontinued        1 g 100 mL/hr over 30 Minutes Intravenous Every 8 hours 08/30/21 1607 08/30/21 1718   08/30/21 1130  cephALEXin (KEFLEX) capsule 500 mg        500 mg Oral  Once 08/30/21 1115 08/30/21 1148        Subjective: Patient seen and examined at bedside.  Poor historian.  No fever, vomiting, chest pain or worsening shortness of breath reported. Objective: Vitals:   09/07/21 2000 09/07/21 2200 09/07/21 2332 09/08/21 0324  BP: (!) 157/93 (!) 155/69 (!) 144/72 (!) 117/58  Pulse: 85 82 83 72  Resp: 14 18 16 15   Temp: 98 F (36.7 C) 97.6 F (36.4 C) 98.2 F (36.8 C) 98 F (36.7 C)  TempSrc: Oral Oral Oral Axillary  SpO2: 100% 99% 95% 95%  Weight:      Height:        Intake/Output Summary (Last 24 hours) at 09/08/2021 0802 Last data filed at 09/08/2021 0651 Gross per 24 hour  Intake 4586.77 ml  Output 4910 ml  Net -323.23 ml    Filed Weights   08/30/21 2121  Weight: 98.1 kg    Examination:  General exam: No distress.  On room air currently.  Looks chronically ill and deconditioned.   Respiratory system: Decreased breath sounds at bases bilaterally with some crackles  cardiovascular system: Rate controlled; S1-S2 heard Gastrointestinal system: Abdomen is distended slightly; soft and nontender.  Bowel sounds are heard  extremities: No right lower extremity clubbing or cyanosis; left BKA present. Central nervous system: Still very slow to respond; awake poor historian.  No focal neurological deficits.  Moves extremities Skin: No obvious petechiae/lesions Psychiatry: Flat affect.  Hardly participates in any meaningful conversation  Data Reviewed: I have personally reviewed following labs and imaging studies  CBC: Recent Labs  Lab 09/03/21 0158 09/05/21 0157 09/05/21 1136 09/06/21 0338  09/07/21 0448 09/07/21 1312 09/07/21 1444 09/08/21 0315  WBC 11.8* 11.9*  --  9.9 10.0  --   --  12.5*  HGB 9.5* 8.6*   < > 9.3* 10.2* 10.9* 10.5* 8.1*  HCT 29.7* 27.0*   < > 28.9* 30.6* 32.0* 31.0* 24.6*  MCV 96.4 95.7  --  96.0 93.3  --   --  94.6  PLT 243 257  --  287 314  --   --  300   < > = values in this interval not displayed.    Basic Metabolic Panel: Recent Labs  Lab 09/04/21 0149 09/05/21 0157 09/05/21 1136 09/06/21 0338 09/07/21 0448 09/07/21 1312 09/07/21 1444 09/08/21 0315  NA 135 133*   < > 136 135 136 137 133*  K 3.4* 3.8   < > 3.9 3.7 4.4 5.0 3.7  CL 109 106  --  111 108 105  --  107  CO2 21* 19*  --  17* 20*  --   --  20*  GLUCOSE 134* 163*  --  175* 149* 195*  --  190*  BUN 20 21  --  21 19 18   --  20  CREATININE 1.31* 1.32*  --  1.19 1.17 1.00  --  1.10  CALCIUM 8.1* 7.9*  --  7.9* 8.0*  --   --  7.9*   < > = values in this interval not displayed.    GFR: Estimated Creatinine Clearance: 81.5 mL/min (by C-G formula based on SCr of 1.1 mg/dL). Liver Function Tests: Recent Labs  Lab 09/03/21 0158  AST 20  ALT 17  ALKPHOS 43  BILITOT 0.9  PROT 5.7*  ALBUMIN 2.3*    No results for input(s): LIPASE, AMYLASE in the last 168 hours. No results for input(s): AMMONIA in the last 168 hours. Coagulation Profile: No results for input(s): INR, PROTIME in the last 168 hours. Cardiac Enzymes: No results for input(s): CKTOTAL, CKMB, CKMBINDEX, TROPONINI in the last 168 hours. BNP (last 3 results) No results for input(s): PROBNP in the last 8760 hours. HbA1C: No results for input(s): HGBA1C in the last 72 hours. CBG: Recent Labs  Lab 09/07/21 0914 09/07/21 1700 09/07/21 1802 09/07/21 2026 09/08/21 0644  GLUCAP 140* 196* 194* 218* 155*    Lipid Profile: No results for input(s): CHOL, HDL, LDLCALC, TRIG, CHOLHDL, LDLDIRECT in the last 72 hours. Thyroid Function Tests: No results for input(s): TSH, T4TOTAL, FREET4, T3FREE, THYROIDAB in the last  72 hours. Anemia Panel: No results for input(s): VITAMINB12, FOLATE, FERRITIN, TIBC, IRON, RETICCTPCT in the last 72 hours. Sepsis Labs: No results for input(s): PROCALCITON, LATICACIDVEN in the last 168 hours.  Recent Results (from the past 240 hour(s))  Resp Panel by RT-PCR (Flu A&B, Covid) Nasopharyngeal Swab     Status: None   Collection Time: 08/30/21 11:47 AM   Specimen: Nasopharyngeal Swab; Nasopharyngeal(NP) swabs in vial transport medium  Result Value Ref Range Status   SARS Coronavirus 2 by RT PCR NEGATIVE NEGATIVE Final    Comment: (NOTE) SARS-CoV-2 target nucleic acids are NOT DETECTED.  The SARS-CoV-2 RNA is generally detectable in upper respiratory specimens during the acute phase of infection. The lowest concentration of SARS-CoV-2 viral copies this assay can detect is 138 copies/mL. A negative result does not preclude SARS-Cov-2 infection and should not be used as the sole basis for treatment or other patient management decisions. A negative result may occur with  improper specimen collection/handling, submission of specimen other than nasopharyngeal swab, presence of viral mutation(s) within the areas targeted by this assay, and inadequate number of viral copies(<138 copies/mL). A negative result must be combined with clinical observations, patient history, and epidemiological information. The expected result is Negative.  Fact Sheet for Patients:  EntrepreneurPulse.com.au  Fact Sheet for Healthcare Providers:  IncredibleEmployment.be  This test is no t yet approved or cleared by the [place] FDA and  has been authorized for detection and/or diagnosis of SARS-CoV-2 by FDA under an Emergency Use Authorization (EUA). This EUA will remain  in effect (meaning this test can be used) for the duration of the COVID-19 declaration under Section 564(b)(1) of the Act, 21 U.S.C.section 360bbb-3(b)(1), unless the authorization is  terminated  or revoked sooner.       Influenza A by PCR NEGATIVE NEGATIVE Final   Influenza B by PCR NEGATIVE NEGATIVE Final    Comment: (NOTE) The Xpert Xpress SARS-CoV-2/FLU/RSV plus assay is intended as an aid in the diagnosis of influenza from Nasopharyngeal swab specimens and should not be used  as a sole basis for treatment. Nasal washings and aspirates are unacceptable for Xpert Xpress SARS-CoV-2/FLU/RSV testing.  Fact Sheet for Patients: EntrepreneurPulse.com.au  Fact Sheet for Healthcare Providers: IncredibleEmployment.be  This test is not yet approved or cleared by the [place] FDA and has been authorized for detection and/or diagnosis of SARS-CoV-2 by FDA under an Emergency Use Authorization (EUA). This EUA will remain in effect (meaning this test can be used) for the duration of the COVID-19 declaration under Section 564(b)(1) of the Act, 21 U.S.C. section 360bbb-3(b)(1), unless the authorization is terminated or revoked.  Performed at [facility], [place] [address] [place], [place] [address]   Blood culture (routine x 2)     Status: None   Collection Time: 08/30/21  4:34 PM   Specimen: BLOOD  Result Value Ref Range Status   Specimen Description BLOOD RIGHT ANTECUBITAL  Final   Special Requests   Final    BOTTLES DRAWN AEROBIC AND ANAEROBIC Blood Culture adequate volume   Culture   Final    NO GROWTH 5 DAYS Performed at [facility], [place] [address], [place], [place] [address]    Report Status 09/04/2021 FINAL  Final  Blood culture (routine x 2)     Status: None   Collection Time: 08/30/21  4:36 PM   Specimen: BLOOD  Result Value Ref Range Status   Specimen Description BLOOD LEFT ANTECUBITAL  Final   Special Requests   Final    BOTTLES DRAWN AEROBIC AND ANAEROBIC Blood Culture adequate volume   Culture   Final    NO GROWTH 5 DAYS Performed at [facility], [place] [address], [place], [place]  [address]    Report Status 09/04/2021 FINAL  Final  MRSA Next Gen by PCR, Nasal     Status: Abnormal   Collection Time: 08/31/21  6:01 AM   Specimen: Nasal Mucosa; Nasal Swab  Result Value Ref Range Status   MRSA by PCR Next Gen DETECTED (A) NOT DETECTED Final    Comment: RESULT CALLED TO, READ BACK BY AND VERIFIED WITH: Earl Lites RN, AT [phone] 08/31/21 D. VANHOOK (NOTE) The GeneXpert MRSA Assay (FDA approved for NASAL specimens only), is one component of a comprehensive MRSA colonization surveillance program. It is not intended to diagnose MRSA infection nor to guide or monitor treatment for MRSA infections. Test performance is not FDA approved in patients less than 36 years old. Performed at [facility], [place] [address] [place], [place] [address]           Radiology Studies: No results found.      Scheduled Meds:  aspirin EC  81 mg Oral Daily   carvedilol  25 mg Oral BID WC   Chlorhexidine Gluconate Cloth  6 each Topical Q0600   docusate sodium  100 mg Oral BID   guaiFENesin  1,200 mg Oral BID   heparin  5,000 Units Subcutaneous Q8H   insulin aspart  0-15 Units Subcutaneous TID WC   insulin aspart  3 Units Subcutaneous TID WC   insulin glargine-yfgn  25 Units Subcutaneous q morning   mometasone-formoterol  2 puff Inhalation BID   pantoprazole  40 mg Oral BID AC   rosuvastatin  20 mg Oral Daily   sertraline  25 mg Oral Daily   sodium chloride flush  3 mL Intravenous Q12H   Continuous Infusions:  sodium chloride     sodium chloride     sodium chloride 75 mL/hr at  09/08/21 0651   ceFEPime (MAXIPIME) IV Stopped (09/08/21 2122)   metronidazole Stopped (09/08/21 0427)   vancomycin Stopped (09/08/21 0042)          Aline August, MD [facility] 09/08/2021, 8:02 AM

## 2021-09-08 NOTE — Progress Notes (Signed)
Subjective  - POD #1, status post right femoral popliteal bypass  No overnight issues   Physical Exam:  Palpable right DP pulse Incisions clean and dry       Assessment/Plan:  POD #1  Doing well with palpable pedal pulse.  Will need PT / OT evaluation and possibly CIR.  Needs to get out of bed today.  I would continue abx for another 5 days.  Continue ASA and statin  Wells Kevork Joyce 09/08/2021 10:41 AM --  Vitals:   09/08/21 0324 09/08/21 0800  BP: (!) 117/58 133/65  Pulse: 72 75  Resp: 15 20  Temp: 98 F (36.7 C) 98 F (36.7 C)  SpO2: 95% 96%    Intake/Output Summary (Last 24 hours) at 09/08/2021 1041 Last data filed at 09/08/2021 0651 Gross per 24 hour  Intake 4586.77 ml  Output 2110 ml  Net 2476.77 ml     Laboratory CBC    Component Value Date/Time   WBC 12.5 (H) 09/08/2021 0315   HGB 8.1 (L) 09/08/2021 0315   HCT 24.6 (L) 09/08/2021 0315   PLT 300 09/08/2021 0315    BMET    Component Value Date/Time   NA 133 (L) 09/08/2021 0315   K 3.7 09/08/2021 0315   CL 107 09/08/2021 0315   CO2 20 (L) 09/08/2021 0315   GLUCOSE 190 (H) 09/08/2021 0315   BUN 20 09/08/2021 0315   CREATININE 1.10 09/08/2021 0315   CALCIUM 7.9 (L) 09/08/2021 0315   GFRNONAA >60 09/08/2021 0315   GFRAA >60 11/21/2017 1001    COAG Lab Results  Component Value Date   INR 1.3 (H) 04/08/2021   INR 2.8 04/22/2020   INR 3.0 04/01/2020   No results found for: PTT  Antibiotics Anti-infectives (From admission, onward)    Start     Dose/Rate Route Frequency Ordered Stop   09/07/21 1800  ceFAZolin (ANCEF) IVPB 2g/100 mL premix  Status:  Discontinued        2 g 200 mL/hr over 30 Minutes Intravenous Every 8 hours 09/07/21 1747 09/07/21 2152   09/05/21 0000  vancomycin (VANCOCIN) IVPB 1000 mg/200 mL premix        1,000 mg 200 mL/hr over 60 Minutes Intravenous Every 12 hours 09/04/21 1331     09/02/21 1515  metroNIDAZOLE (FLAGYL) IVPB 500 mg        500 mg 100 mL/hr over 60  Minutes Intravenous Every 12 hours 09/02/21 1350     09/02/21 1445  ceFEPIme (MAXIPIME) 2 g in sodium chloride 0.9 % 100 mL IVPB        2 g 200 mL/hr over 30 Minutes Intravenous Every 8 hours 09/02/21 1350     09/01/21 0800  vancomycin (VANCOREADY) IVPB 1750 mg/350 mL  Status:  Discontinued        1,750 mg 175 mL/hr over 120 Minutes Intravenous Every 24 hours 08/31/21 1332 09/04/21 1331   08/31/21 0800  vancomycin (VANCOREADY) IVPB 1750 mg/350 mL  Status:  Discontinued        1,750 mg 175 mL/hr over 120 Minutes Intravenous Every 12 hours 08/30/21 1750 08/31/21 1332   08/30/21 1745  vancomycin (VANCOREADY) IVPB 2000 mg/400 mL        2,000 mg 200 mL/hr over 120 Minutes Intravenous  Once 08/30/21 1738 08/30/21 2344   08/30/21 1745  piperacillin-tazobactam (ZOSYN) IVPB 3.375 g  Status:  Discontinued        3.375 g 12.5 mL/hr over 240 Minutes Intravenous Every 8 hours  08/30/21 1738 09/02/21 1350   08/30/21 1615  ceFAZolin (ANCEF) IVPB 1 g/50 mL premix  Status:  Discontinued        1 g 100 mL/hr over 30 Minutes Intravenous Every 8 hours 08/30/21 1607 08/30/21 1718   08/30/21 1130  cephALEXin (KEFLEX) capsule 500 mg        500 mg Oral  Once 08/30/21 1115 08/30/21 1148        V. Leia Alf, M.D., [facility] [place] Office: [phone] Pager:  [phone]

## 2021-09-09 ENCOUNTER — Encounter (HOSPITAL_COMMUNITY): Payer: Self-pay | Admitting: Surgery

## 2021-09-09 DIAGNOSIS — I5042 Chronic combined systolic (congestive) and diastolic (congestive) heart failure: Secondary | ICD-10-CM | POA: Diagnosis not present

## 2021-09-09 DIAGNOSIS — N1831 Chronic kidney disease, stage 3a: Secondary | ICD-10-CM | POA: Diagnosis not present

## 2021-09-09 DIAGNOSIS — I5032 Chronic diastolic (congestive) heart failure: Secondary | ICD-10-CM | POA: Diagnosis not present

## 2021-09-09 DIAGNOSIS — E1169 Type 2 diabetes mellitus with other specified complication: Secondary | ICD-10-CM | POA: Diagnosis not present

## 2021-09-09 DIAGNOSIS — I70233 Atherosclerosis of native arteries of right leg with ulceration of ankle: Secondary | ICD-10-CM | POA: Diagnosis not present

## 2021-09-09 DIAGNOSIS — Z7401 Bed confinement status: Secondary | ICD-10-CM | POA: Diagnosis not present

## 2021-09-09 DIAGNOSIS — E119 Type 2 diabetes mellitus without complications: Secondary | ICD-10-CM | POA: Diagnosis not present

## 2021-09-09 DIAGNOSIS — M2550 Pain in unspecified joint: Secondary | ICD-10-CM | POA: Diagnosis not present

## 2021-09-09 DIAGNOSIS — N183 Chronic kidney disease, stage 3 unspecified: Secondary | ICD-10-CM | POA: Diagnosis not present

## 2021-09-09 DIAGNOSIS — Z89512 Acquired absence of left leg below knee: Secondary | ICD-10-CM | POA: Diagnosis not present

## 2021-09-09 DIAGNOSIS — I480 Paroxysmal atrial fibrillation: Secondary | ICD-10-CM | POA: Diagnosis not present

## 2021-09-09 DIAGNOSIS — E038 Other specified hypothyroidism: Secondary | ICD-10-CM | POA: Diagnosis not present

## 2021-09-09 DIAGNOSIS — I739 Peripheral vascular disease, unspecified: Secondary | ICD-10-CM | POA: Diagnosis not present

## 2021-09-09 DIAGNOSIS — J209 Acute bronchitis, unspecified: Secondary | ICD-10-CM | POA: Diagnosis not present

## 2021-09-09 DIAGNOSIS — L97518 Non-pressure chronic ulcer of other part of right foot with other specified severity: Secondary | ICD-10-CM | POA: Diagnosis not present

## 2021-09-09 DIAGNOSIS — R296 Repeated falls: Secondary | ICD-10-CM | POA: Diagnosis not present

## 2021-09-09 DIAGNOSIS — I2510 Atherosclerotic heart disease of native coronary artery without angina pectoris: Secondary | ICD-10-CM | POA: Diagnosis not present

## 2021-09-09 DIAGNOSIS — J45909 Unspecified asthma, uncomplicated: Secondary | ICD-10-CM | POA: Diagnosis not present

## 2021-09-09 DIAGNOSIS — E782 Mixed hyperlipidemia: Secondary | ICD-10-CM | POA: Diagnosis not present

## 2021-09-09 DIAGNOSIS — Z1159 Encounter for screening for other viral diseases: Secondary | ICD-10-CM | POA: Diagnosis not present

## 2021-09-09 DIAGNOSIS — G3184 Mild cognitive impairment, so stated: Secondary | ICD-10-CM | POA: Diagnosis not present

## 2021-09-09 DIAGNOSIS — L089 Local infection of the skin and subcutaneous tissue, unspecified: Secondary | ICD-10-CM | POA: Diagnosis not present

## 2021-09-09 DIAGNOSIS — M47892 Other spondylosis, cervical region: Secondary | ICD-10-CM | POA: Diagnosis not present

## 2021-09-09 DIAGNOSIS — E1165 Type 2 diabetes mellitus with hyperglycemia: Secondary | ICD-10-CM | POA: Diagnosis not present

## 2021-09-09 DIAGNOSIS — E46 Unspecified protein-calorie malnutrition: Secondary | ICD-10-CM | POA: Diagnosis not present

## 2021-09-09 DIAGNOSIS — K209 Esophagitis, unspecified without bleeding: Secondary | ICD-10-CM | POA: Diagnosis not present

## 2021-09-09 DIAGNOSIS — M6281 Muscle weakness (generalized): Secondary | ICD-10-CM | POA: Diagnosis not present

## 2021-09-09 DIAGNOSIS — I10 Essential (primary) hypertension: Secondary | ICD-10-CM | POA: Diagnosis not present

## 2021-09-09 DIAGNOSIS — E559 Vitamin D deficiency, unspecified: Secondary | ICD-10-CM | POA: Diagnosis not present

## 2021-09-09 DIAGNOSIS — E1122 Type 2 diabetes mellitus with diabetic chronic kidney disease: Secondary | ICD-10-CM | POA: Diagnosis not present

## 2021-09-09 DIAGNOSIS — J439 Emphysema, unspecified: Secondary | ICD-10-CM | POA: Diagnosis not present

## 2021-09-09 DIAGNOSIS — D518 Other vitamin B12 deficiency anemias: Secondary | ICD-10-CM | POA: Diagnosis not present

## 2021-09-09 DIAGNOSIS — E1159 Type 2 diabetes mellitus with other circulatory complications: Secondary | ICD-10-CM | POA: Diagnosis not present

## 2021-09-09 DIAGNOSIS — I70235 Atherosclerosis of native arteries of right leg with ulceration of other part of foot: Secondary | ICD-10-CM | POA: Diagnosis not present

## 2021-09-09 DIAGNOSIS — L97519 Non-pressure chronic ulcer of other part of right foot with unspecified severity: Secondary | ICD-10-CM | POA: Diagnosis not present

## 2021-09-09 DIAGNOSIS — L97819 Non-pressure chronic ulcer of other part of right lower leg with unspecified severity: Secondary | ICD-10-CM | POA: Diagnosis not present

## 2021-09-09 LAB — GLUCOSE, CAPILLARY
Glucose-Capillary: 110 mg/dL — ABNORMAL HIGH (ref 70–99)
Glucose-Capillary: 153 mg/dL — ABNORMAL HIGH (ref 70–99)
Glucose-Capillary: 139 mg/dL — ABNORMAL HIGH (ref 70–99)

## 2021-09-09 LAB — RESP PANEL BY RT-PCR (FLU A&B, COVID) ARPGX2
Influenza A by PCR: NEGATIVE
Influenza B by PCR: NEGATIVE
SARS Coronavirus 2 by RT PCR: NEGATIVE

## 2021-09-09 MED ORDER — ROSUVASTATIN CALCIUM 20 MG PO TABS: 20.0000 mg | ORAL_TABLET | Freq: Every day | ORAL | 0 refills | Status: AC

## 2021-09-09 MED ORDER — APIXABAN 5 MG PO TABS
5.0000 mg | ORAL_TABLET | Freq: Two times a day (BID) | ORAL | Status: DC
  Administered 2021-09-09: 5 mg via ORAL
  Filled 2021-09-09: qty 1

## 2021-09-09 MED ORDER — AMOXICILLIN-POT CLAVULANATE 875-125 MG PO TABS: 1.0000 | ORAL_TABLET | Freq: Two times a day (BID) | ORAL | 0 refills | Status: AC

## 2021-09-09 MED ORDER — AMOXICILLIN-POT CLAVULANATE 875-125 MG PO TABS
1.0000 | ORAL_TABLET | Freq: Two times a day (BID) | ORAL | Status: DC
  Administered 2021-09-09: 1 via ORAL
  Filled 2021-09-09: qty 1

## 2021-09-09 MED ORDER — DOXYCYCLINE HYCLATE 100 MG PO TABS: 100.0000 mg | ORAL_TABLET | Freq: Two times a day (BID) | ORAL | 0 refills | Status: AC

## 2021-09-09 MED ORDER — ASPIRIN 81 MG PO TBEC: 81.0000 mg | DELAYED_RELEASE_TABLET | Freq: Every day | ORAL | 0 refills | Status: DC

## 2021-09-09 MED ORDER — DOXYCYCLINE HYCLATE 100 MG PO TABS
100.0000 mg | ORAL_TABLET | Freq: Two times a day (BID) | ORAL | Status: DC
  Administered 2021-09-09: 100 mg via ORAL
  Filled 2021-09-09: qty 1

## 2021-09-09 NOTE — TOC Transition Note (Signed)
Transition of Care [facility]) - CM/SW Discharge Note   Patient Details  Name: Brian Martin MRN: [id number] Date of Birth: Jan 10, 1956  Transition of Care [facility]) CM/SW Contact:  Brian Martin, [place] Phone Number: 09/09/2021, 1:47 PM   Clinical Narrative:    SNF auth approved 1/13 -- 1/17 [id number]  [id number] Anticipated DC date: 09/09/21 Family notified: Brian Martin (Daughter)  [phone] (Mobile) Transport by: Brian Martin   Per MD patient ready for DC to [facility]. RN, patient's family, and facility notified of DC. Discharge Summary and FL2 sent to facility. RN to call report prior to discharge ([phone] Room 101). DC packet on chart. Ambulance transport requested for patient.   CSW will sign off for now as social work intervention is no longer needed. Please consult [place] again if new needs arise.   Final next level of care: Skilled Nursing Facility Barriers to Discharge: No Barriers Identified   Patient Goals and CMS Choice Patient states their goals for this hospitalization and ongoing recovery are:: "move in with my daughter" CMS Medicare.gov Compare Post Acute Care list provided to:: Patient Represenative (must comment) Choice offered to / list presented to : Spouse  Discharge Placement              Patient [facility] bed at: [facility] Patient to be transferred to facility by: [place] Name of family member notified: Brian Martin (Daughter)   [phone] [facility]) Patient and family notified of of transfer: 09/09/21  Discharge Plan and Services     Post Acute Care Choice: [facility]                               Social Determinants of Health (SDOH) Interventions     Readmission Risk Interventions No flowsheet data found.

## 2021-09-09 NOTE — Discharge Summary (Addendum)
Physician Discharge Summary  Brian Martin:[id number] DOB: [birth year] DOA: 08/30/2021  PCP: Marco Collie, MD  Admit date: 08/30/2021 Discharge date: 09/09/2021  Admitted From: Home Disposition: Home  Recommendations for Outpatient Follow-up:  Follow up with PCP in 1 week with repeat CBC/BMP Outpatient follow-up with vascular surgery/cardiology.  Wound care as per vascular surgery recommendations Recommend outpatient palliative care evaluation and follow-up for goals of care discussion Follow up in ED if symptoms worsen or new appear   Home Health: No Equipment/Devices: None  Discharge Condition: Stable CODE STATUS: Full Diet recommendation: Heart healthy/carb modified/fluid restriction of up to 1500 cc a day  Brief/Interim Summary: 66 year old male currently a nursing home resident with history of cognitive decline, PVD status post left BKA, PAF on Eliquis, chronic diastolic CHF, IDDM, hypertension presented with frequent falls.  And right foot ulcer which has remained persistent since November 2022 despite a course of antibiotic treatment.  On admission, he was started on broad-spectrum antibiotics.  MRI of foot was negative for osteomyelitis.  ABI showed poor flow to the left right ankle.  Vascular surgery and cardiology were consulted.  He underwent right femoral to above-knee popliteal artery bypass graft on 09/07/2021.  Subsequently, he has remained stable.  Vascular surgery has cleared the patient for discharge with outpatient follow-up with vascular surgery and also cleared the patient to be resumed back on Eliquis.  He will be discharged to SNF once bed is available.  Discharge Diagnoses:   Right foot ulcer Peripheral vascular disease status post left BKA -MRI of foot was negative for osteomyelitis. -ABI showed poor flow to the left right ankle. -Vascular surgery following: Underwent arteriogram on 09/02/2021.   underwent right femoral to above-knee popliteal artery bypass graft  on 09/07/2021 -Currently on broad-spectrum antibiotics.  Vascular surgery recommended total of 5 days of antibiotics postprocedure.  We will switch to 3 more days of oral Augmentin and doxycycline.   -Vascular surgery has cleared the patient for discharge with close outpatient follow-up with vascular surgery.  Discharge patient to SNF once bed is available.    Sepsis: ruled out  CAD -Cardiology signed off on 09/08/2021: Cardiac catheterization on 09/05/2021 showed no significant obstructive disease.  Continue aspirin.  Outpatient follow-up with cardiology.   Leukocytosis -Mild.  Monitor intermittently.  Chronic kidney disease stage IIIa  -Baseline creatinine 1.2-1.5.  Currently stable  Essential hypertension -continue Coreg.  Resume lisinopril and Lasix.  Hyperlipidemia -continue statin: Has been switched to Crestor during this hospitalization which will be continued  Depression -continue Zoloft   Paroxysmal A. Fib -Currently rate controlled.  Continue Coreg.  Resume Eliquis: Vascular surgery has given clearance to do the same  Chronic combined systolic and diastolic CHF -euvolemic, continue Coreg, furosemide.  Resume lisinopril.  Continue diet and fluid restriction.  Outpatient follow-up with cardiology  Type 2 diabetes mellitus, with hyperglycemia -A1c 7.0.  Carb modified diet.  Continue home regimen.  Generalized conditioning Cognition decline -Overall condition has been getting worse recently.  Palliative care consultation for goals of care discussion is pending: This can happen upon discharge at rehab.    Discharge Instructions  Discharge Instructions     Amb Referral to Palliative Care   Complete by: As directed    Ambulatory referral to Cardiology   Complete by: As directed    Diet - low sodium heart healthy   Complete by: As directed    Diet Carb Modified   Complete by: As directed    Discharge wound care:  Complete by: As directed    Cleanse right foot wound  with saline, pat dry.  Cut to fit small piece of silver hydrofiber (Aquacel Ag+) and place in the wound bed, top with foam dressing. Change silver dressing every other, ok to change foam every 3 days   Increase activity slowly   Complete by: As directed       Allergies as of 09/09/2021   No Known Allergies      Medication List     STOP taking these medications    simvastatin 10 MG tablet Commonly known as: ZOCOR       TAKE these medications    albuterol (2.5 MG/3ML) 0.083% nebulizer solution Commonly known as: PROVENTIL Take 2.5 mg by nebulization every 6 (six) hours as needed for wheezing or shortness of breath.   amoxicillin-clavulanate 875-125 MG tablet Commonly known as: AUGMENTIN Take 1 tablet by mouth every 12 (twelve) hours for 3 days.   apixaban 5 MG Tabs tablet Commonly known as: Eliquis Take 1 tablet (5 mg total) by mouth 2 (two) times daily. NEEDS APPOINTMENT FOR FUTURE REFILLS   aspirin 81 MG EC tablet Take 1 tablet (81 mg total) by mouth daily. Swallow whole. Start taking on: September 10, 2021   budesonide-formoterol 160-4.5 MCG/ACT inhaler Commonly known as: SYMBICORT Inhale 2 puffs into the lungs 2 (two) times daily.   carvedilol 25 MG tablet Commonly known as: COREG Take 25 mg by mouth 2 (two) times daily with a meal.   docusate sodium 100 MG capsule Commonly known as: COLACE Take 1 capsule (100 mg total) by mouth 2 (two) times daily.   doxycycline 100 MG tablet Commonly known as: VIBRA-TABS Take 1 tablet (100 mg total) by mouth every 12 (twelve) hours for 3 days.   furosemide 20 MG tablet Commonly known as: LASIX Take 1 tablet (20 mg total) by mouth daily.   HumaLOG KwikPen 100 UNIT/ML KwikPen Generic drug: insulin lispro Inject 3 Units into the skin 3 (three) times daily.   Lantus SoloStar 100 UNIT/ML Solostar Pen Generic drug: insulin glargine Inject 25 Units into the skin every morning. What changed: Another medication with the same  name was removed. Continue taking this medication, and follow the directions you see here.   lisinopril 10 MG tablet Commonly known as: ZESTRIL Take 10 mg by mouth daily.   pantoprazole 40 MG tablet Commonly known as: Protonix Take 1 tablet (40 mg total) by mouth daily.   rosuvastatin 20 MG tablet Commonly known as: CRESTOR Take 1 tablet (20 mg total) by mouth daily. Start taking on: September 10, 2021   sertraline 25 MG tablet Commonly known as: ZOLOFT Take 25 mg by mouth daily.   vitamin C 250 MG tablet Commonly known as: ASCORBIC ACID Take 250 mg by mouth daily.               Discharge Care Instructions  (From admission, onward)           Start     Ordered   09/09/21 0000  Discharge wound care:       Comments: Cleanse right foot wound with saline, pat dry.  Cut to fit small piece of silver hydrofiber (Aquacel Ag+) and place in the wound bed, top with foam dressing. Change silver dressing every other, ok to change foam every 3 days   09/09/21 1059            Follow-up Information     Jerline Pain, MD Follow up  on 02/24/2022.   Specialty: Cardiology Why: 9:00 AM Contact information: [address] [address] [phone]         [facility] [place] Follow up in 2 week(s).   Specialty: Vascular Surgery Why: Office will call you to arrange your appt (sent) Contact information: [address] [place] [phone]               No Known Allergies  Consultations: Vascular surgery/cardiology Palliative care consultation is pending   Procedures/Studies: CT Head Wo Contrast  Result Date: 08/30/2021 CLINICAL DATA:  Trauma, fall EXAM: CT HEAD WITHOUT CONTRAST TECHNIQUE: Contiguous axial images were obtained from the base of the skull through the vertex without intravenous contrast. COMPARISON:  06/10/2021 FINDINGS: Brain: No acute intracranial findings are seen.  There are no signs of bleeding. Cortical sulci are prominent. There is decreased density in the periventricular and subcortical white matter. There are possible small old lacunar infarcts in basal ganglia and periventricular regions. Cavum septum pellucidum and cavum septum vergae are seen. There is no shift of midline structures. No significant interval changes are noted. Vascular: Unremarkable. Skull: Unremarkable. Sinuses/Orbits: Unremarkable. Other: None IMPRESSION: No acute intracranial findings are seen. Atrophy. Small-vessel disease. Electronically Signed   By: Elmer Picker M.D.   On: 08/30/2021 12:11   CT Chest Wo Contrast  Result Date: 08/30/2021 CLINICAL DATA:  Cough and sepsis.  Normal chest x-ray. EXAM: CT CHEST WITHOUT CONTRAST TECHNIQUE: Multidetector CT imaging of the chest was performed following the standard protocol without IV contrast. COMPARISON:  Radiograph earlier today. FINDINGS: Cardiovascular: The heart is normal in size. Incomplete pericardial calcifications posteroinferior to the left atrium is well as anterior to the right atrium. Mitral annulus and coronary artery calcifications. Aortic atherosclerosis without aortic aneurysm. No periaortic stranding. Mediastinum/Nodes: Diffuse esophageal wall thickening with occasional intraluminal debris. No paraesophageal stranding. Multiple small mediastinal lymph nodes are not enlarged by size criteria. Limited assessment for hilar adenopathy on this unenhanced exam. Possible 2.1 cm exophytic nodule from the posterior aspect of the right thyroid gland, series 3, image 19, difficult to accurately assess. Alternatively this may represent an enlarged parathyroid gland or lymph node. Lungs/Pleura: Moderate retained debris within the dependent trachea. Mild retained debris within the bilateral mainstem bronchi. Diffuse central bronchial thickening, most prominently affecting the right greater than left lower lobes. Occasional areas of mucous  plugging/bronchial occlusion in the segmental right lower lobe. There are small nodular opacities in the dependent right lower lobe, in the region of bronchial occlusion/mucous plugging, series 4 images 101-103. Additional mild patchy nodularity in the left lower lobe series 4, image 107. Subsegmental opacities within the dependent lower lobes may be postobstructive atelectasis. Mild emphysema. Scattered tiny peripheral calcified granuloma. No dominant pulmonary mass. No pleural fluid. No findings of pulmonary edema. Upper Abdomen: Layering gallstones in the gallbladder. Small subcapsular cyst in the dome of the liver. Fatty atrophy of the pancreas. Symmetric perinephric stranding about the included kidneys. Musculoskeletal: Advanced glenohumeral osteoarthritis on the right. Remote left anterior rib fractures. No acute osseous findings. IMPRESSION: 1. Diffuse esophageal wall thickening with occasional intraluminal debris, suspicious for esophagitis. 2. Layering debris within the trachea and mainstem bronchi. Central bronchial thickening with subsegmental mucous plugging in the right lower lobe. Small nodular opacities in the dependent right lower lobe and to a lesser extent left lower lobe, in the region of bronchial occlusion/mucous plugging. In the setting of sepsis and cough, findings likely represent infectious bronchiolitis.  The possibility of aspiration is raised. 3. Mild emphysema. 4. Possible 2.1 cm exophytic nodule from the posterior aspect of the right thyroid gland. Alternatively this may represent an enlarged parathyroid gland or lymph node. Recommend thyroid US (ref: J Am Coll Radiol. 2015 Feb;12(2): 143-50). 5. Aortic atherosclerosis and coronary artery calcifications. 6. Pericardial calcifications, can be seen in the setting of prior pericarditis or constrictive cardiomyopathy. Clinical correlation is recommended, consider further assessment with echocardiogram as clinically indicated. Aortic  Atherosclerosis (ICD10-I70.0) and Emphysema (ICD10-J43.9). Electronically Signed   By: Keith Rake M.D.   On: 08/30/2021 17:43   CT Cervical Spine Wo Contrast  Result Date: 08/30/2021 CLINICAL DATA:  Trauma EXAM: CT CERVICAL SPINE WITHOUT CONTRAST TECHNIQUE: Multidetector CT imaging of the cervical spine was performed without intravenous contrast. Multiplanar CT image reconstructions were also generated. COMPARISON:  None. FINDINGS: Alignment: Alignment of posterior margins of vertebral bodies is unremarkable. Skull base and vertebrae: No recent fracture is seen. Small smooth marginated calcification noted adjacent to the tip of spinous process of C7 vertebra may be residual from previous injury. Soft tissues and spinal canal: Prevertebral soft tissues are unremarkable. There is no significant spinal stenosis. Disc levels: Degenerative changes are noted at C5-C6 level with mild encroachment of neural foramina. Upper chest: Multiple blebs are seen in both Other: Scattered arterial calcifications are seen. IMPRESSION: No recent fracture is seen in the cervical spine. Cervical spondylosis at C5-C6 level with mild encroachment of neural foramina. Electronically Signed   By: Elmer Picker M.D.   On: 08/30/2021 12:18   CARDIAC CATHETERIZATION  Result Date: 09/05/2021 Images from the original result were not included. Brian Martin is a 66 y.o. male  [id number] LOCATION:  FACILITY: [facility] PHYSICIAN: Quay Burow, M.D. [birth year] DATE OF PROCEDURE:  09/05/2021 DATE OF DISCHARGE: CARDIAC CATHETERIZATION History obtained from chart review.66 y.o. male with severe peripheral vascular disease awaiting right lower extremity arterial bypass with prior left BKA.  Recent chest CT demonstrates quite significant coronary artery calcification.  In addition, there was calcification on the pericardium noted on CT.  The patient presents today for right left heart cath for preoperative clearance before peripheral vascular  surgery.   Mr. Platte has a left dominant system with no obstructive disease.  His filling pressures were fairly normal.  Cardiac output was 6.6 L/min with an index of 3 L/min/m and his LVEDP was 14.  He is a low risk for undergoing vascular surgery.  The radial sheath was removed and a TR band was placed on the right wrist to achieve patent hemostasis.  The patient left lab in stable condition.  Dr. Johnsie Cancel, the patient's attending cardiologist, was notified of these results. Quay Burow. MD, [facility] 09/05/2021 12:10 PM    MR FOOT RIGHT WO CONTRAST  Result Date: 08/31/2021 CLINICAL DATA:  Osteomyelitis, foot EXAM: MRI OF THE RIGHT FOREFOOT WITHOUT CONTRAST TECHNIQUE: Multiplanar, multisequence MR imaging of the right forefoot was performed. No intravenous contrast was administered. COMPARISON:  None. FINDINGS: Bones/Joint/Cartilage Extensive motion artifact which limits evaluation. There is artifactually increased signal along the lateral forefoot at the fourth and fifth toes and webspace, as evidence by no corresponding signal on STIR. Ligaments Poorly visualized due to motion artifact. Muscles and Tendons Diffuse atrophy in the foot as is commonly seen in diabetics. No visible tendon tear. Soft tissues There is forefoot soft tissue swelling. No drainable fluid collection. IMPRESSION: Motion degraded exam. No evidence of osteomyelitis or drainable soft tissue abscess. Electronically Signed   By: Edison Nasuti  Chancy Milroy M.D.   On: 08/31/2021 08:25   PERIPHERAL VASCULAR CATHETERIZATION  Result Date: 09/02/2021 Table formatting from the original result was not included. INDICATIONS:  Brian Martin is a 66 y.o. male with critical limb ischemia of the right lower extremity.  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  He has a left below the knee amputation. PROCEDURE:  Conscious sedation Ultrasound-guided access to the left common femoral artery Aortogram  with bilateral iliac arteriogram Selective catheterization of the right external iliac artery with right lower extremity runoff Selective catheterization of the right superficial femoral artery third order catheterization with attempted PTA of the right superficial femoral artery SURGEON: Judeth Cornfield. Scot Dock, MD, FACS ANESTHESIA: Local with sedation EBL: Minimal TECHNIQUE: The patient was brought to the peripheral vascular lab and was sedated. The period of conscious sedation was 92 minutes.  During that time period, I was present face-to-face 100% of the time.  The patient was administered 1 mg of Versed and 50 mcg of fentanyl. The patient's heart rate, blood pressure, and oxygen saturation were monitored by the nurse continuously during the procedure. Both groins were prepped and draped in the usual sterile fashion.  Under ultrasound guidance, after the skin was anesthetized, I cannulated the left common femoral artery with a micropuncture needle and a micropuncture sheath was introduced over a wire.  This was exchanged for a 5 [place] sheath over a Bentson wire.  By ultrasound the femoral artery was patent. A real-time image was obtained and sent to the server. The pigtail catheter was positioned at the L1 vertebral body and flush aortogram obtained.  The catheter was in position above the aortic bifurcation and an oblique iliac projection was obtained.  I then exchanged the pigtail catheter for a crossover catheter which was positioned into the right common iliac artery.  I advanced the wire down into the common femoral artery and the crossover catheter was exchanged for a straight catheter.  Selective right external iliac arteriogram was obtained with right lower extremity runoff.  There was occlusion of the right superficial femoral artery that I felt could potentially be addressed with angioplasty and stenting.  I exchanged the straight catheter for a destination sheath (6 [place]) over a Glidewire advantage  wire.  I then used a quick cross catheter and the Glidewire advantage to get into the proximal superficial femoral artery.  I was able to establish a dissection plane but ultimately was unable to reenter distally.  I was concerned that I was going to create a dissection further distally that would then limit his options for open surgery.  As it stood now he was a candidate for a right femoral to above-knee popliteal artery bypass and I did not want a burn that bridge.  I therefore removed the wire and repeated the arteriogram which showed that the above-knee popliteal artery was still patent leaving the option for a femoral to above-knee popliteal artery bypass.  The sheath was then retracted and positioned in the common femoral artery on the left.  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  The patient was transferred to the holding area for removal of the sheath.  No immediate complications were noted. FINDINGS: There are single renal arteries bilaterally with no significant renal artery stenosis identified. The infrarenal aorta is patent. On the right side,  which is the side of concern, the common iliac and external iliac arteries are patent.  There is calcific disease in the iliac system.  The hypogastric artery is patent. The right superficial femoral artery was occluded at its origin.  The common femoral and deep femoral artery were patent.  There was reconstitution of the above-knee popliteal artery on the right with two-vessel runoff via the anterior tibial and peroneal arteries.  The posterior tibial artery is occluded. I was unable to perform successful angioplasty and stenting of the right superficial femoral artery as I could not get back into the normal system distally and was concerned that I was creating a more distal dissection. On the left side, the common iliac artery is patent.  The left  external iliac artery appears to have a short segment occlusion. CLINICAL NOTE: I will review the films with Dr. Trula Slade.  He may potentially be a candidate for a right femoral to above-knee popliteal artery bypass. [name], MD, FACS [facility]   DG Chest Portable 1 View  Result Date: 08/30/2021 CLINICAL DATA:  Trauma, fall EXAM: PORTABLE CHEST 1 VIEW COMPARISON:  04/06/2021 FINDINGS: Transverse diameter of heart is slightly increased. Thoracic aorta is tortuous and ectatic. There are no signs of alveolar pulmonary edema or focal pulmonary consolidation. Linear densities in the left lower lung fields may suggest crowding of bronchovascular structures or subsegmental atelectasis. There is no pleural effusion or pneumothorax. Degenerative changes are noted in the right shoulder. IMPRESSION: There are no signs of pulmonary edema or focal pulmonary consolidation. Linear densities in the left lower lung fields may suggest crowding of normal bronchovascular structures or subsegmental atelectasis. Electronically Signed   By: Elmer Picker M.D.   On: 08/30/2021 11:42   DG Foot 2 Views Right  Result Date: 08/30/2021 CLINICAL DATA:  Trauma, pain and swelling EXAM: RIGHT FOOT - 2 VIEW COMPARISON:  02/18/2016 FINDINGS: No fracture or dislocation is seen. Small plantar spur is seen in calcaneus. There are no focal lytic lesions. If there is clinical suspicion for osteomyelitis, three-phase bone scan or MRI may be considered. Vascular calcifications are seen in the soft tissues. IMPRESSION: No significant radiographic abnormality is seen in the right foot. Electronically Signed   By: Elmer Picker M.D.   On: 08/30/2021 11:45   DG C-Arm 1-60 Min  Result Date: 09/08/2021 CLINICAL DATA:  Bypass graft. EXAM: DG C-ARM 1-60 MIN COMPARISON:  MRI RIGHT foot, 08/30/2021. FLUOROSCOPY TIME:  Fluoroscopy Time:  1 minute, 23 seconds. Radiation Exposure Index (if provided by  the fluoroscopic device): 29.1 Number of Acquired Spot Images: 2 fluoroscopic cine loops, each with 69 on 98 images respectively. FINDINGS: Multiple, limited oblique digitally subtracted views of the RIGHT pelvis obtained by C-arm, in 2 fluoroscopic cine loops. Images demonstrating LEFT femoral cannulation, intravascular catheter extending up and over the aortic bifurcation into the RIGHT common iliac artery then digital subtracted views of the RIGHT iliac and femoral vessels. Multifocal atherosclerosis along the RIGHT external iliac artery without hemodynamically significant stenosis. Opacification of the proximal RIGHT femoral-to-popliteal venous graft without evidence of extravasation at the proximal anastomosis, and filling of the proximal venous segment. IMPRESSION: Fluoroscopic imaging for RIGHT pelvis and proximal lower extremity angiogram, and femoral-to-popliteal venous graft. No radiographic evidence of acute complication within the images provided. For complete description of intra procedural findings, please see performing service dictation. Electronically Signed   By: Michaelle Birks M.D.   On: 09/08/2021 08:25   VAS [place]  ABI WITH/WO TBI  Result Date: 08/31/2021  LOWER EXTREMITY DOPPLER STUDY Patient Name:  RAHM MINIX  Date of Exam:   08/31/2021 Medical Rec #: [id number]        Accession #:    [id number] Date of Birth: 1955-12-18        Patient Gender: M Patient Age:   32 years Exam Location[facility]  [facility] Procedure:      VAS [place] ABI WITH/WO TBI Referring Phys: Wynetta Fines --------------------------------------------------------------------------------  High Risk Factors: Hypertension, Diabetes.  Vascular Interventions: LT AKA. Limitations: Today's exam was limited due to PVD w/ intermittent claudication. Comparison Study: 02/20/16 prior Performing Technologist: Archie Patten RVS  Examination Guidelines: A complete evaluation includes at minimum, Doppler waveform signals and systolic blood pressure  reading at the level of bilateral brachial, anterior tibial, and posterior tibial arteries, when vessel segments are accessible. Bilateral testing is considered an integral part of a complete examination. Photoelectric Plethysmograph (PPG) waveforms and toe systolic pressure readings are included as required and additional duplex testing as needed. Limited examinations for reoccurring indications may be performed as noted.  ABI Findings: +---------+------------------+-----+-------------------+-----------------------+  Right     Rt Pressure (mmHg) Index Waveform            Comment                  +---------+------------------+-----+-------------------+-----------------------+  Brachial  155                      triphasic                                    +---------+------------------+-----+-------------------+-----------------------+  PTA       84                 0.54  monophasic                                   +---------+------------------+-----+-------------------+-----------------------+  DP        72                 0.46  dampened monophasic                          +---------+------------------+-----+-------------------+-----------------------+  Great Toe                                              unable to obtain                                                                 pressure due to                                                                  bandaging                +---------+------------------+-----+-------------------+-----------------------+ +--------+------------------+-----+---------+-------+  Left     Lt Pressure (mmHg) Index Waveform  Comment  +--------+------------------+-----+---------+-------+  Brachial 152                      triphasic          +--------+------------------+-----+---------+-------+  Summary: Right: Resting right ankle-brachial index indicates moderate right lower extremity arterial disease. Left: AKA.  *See table(s) above for measurements and observations.   Electronically signed by Harold Barban MD on 08/31/2021 at 11:29:57 PM.    Final    VAS [place] LOWER EXTREMITY SAPHENOUS VEIN MAPPING  Result Date: 09/04/2021 LOWER EXTREMITY VEIN MAPPING Patient Name:  Brian Martin  Date of Exam:   09/03/2021 Medical Rec #: [id number]        Accession #:    [id number] Date of Birth: 08/26/1956        Patient Gender: M Patient Age:   15 years Exam Location:  [facility] Procedure:      VAS [place] LOWER EXTREMITY SAPHENOUS VEIN MAPPING Referring Phys: Harold Barban --------------------------------------------------------------------------------  Indications:        Pre-op Other Indications:  Right ulceration History:            History of PAD; patient is pre-operative for lower extremity                     bypass graft. Other Risk Factors: History of left BKA.  Comparison Study: No prior study Performing Technologist: Sharion Dove RVS  Examination Guidelines: A complete evaluation includes B-mode imaging, spectral Doppler, color Doppler, and power Doppler as needed of all accessible portions of each vessel. Bilateral testing is considered an integral part of a complete examination. Limited examinations for reoccurring indications may be performed as noted. +---------------+-----------+----------------------+---------------+-----------+    RT Diameter   RT Findings          GSV             LT Diameter   LT Findings        (cm)                                               (cm)                    +---------------+-----------+----------------------+---------------+-----------+       0.67                       Saphenofemoral                                                                      Junction                                     +---------------+-----------+----------------------+---------------+-----------+       0.55                       Proximal thigh                                  +---------------+-----------+----------------------+---------------+-----------+  0.52        branching        Mid thigh                                     +---------------+-----------+----------------------+---------------+-----------+       0.63        branching       Distal thigh                                   +---------------+-----------+----------------------+---------------+-----------+       0.57                            Knee                                       +---------------+-----------+----------------------+---------------+-----------+       0.33                         Prox calf                                     +---------------+-----------+----------------------+---------------+-----------+       0.35        branching         Mid calf                                     +---------------+-----------+----------------------+---------------+-----------+       0.25        branching       Distal calf                                    +---------------+-----------+----------------------+---------------+-----------+       0.19        branching          Ankle                                       +---------------+-----------+----------------------+---------------+-----------+ Diagnosing physician: Orlie Pollen Electronically signed by Orlie Pollen on 09/04/2021 at 2:33:17 PM.    Final    ECHOCARDIOGRAM COMPLETE  Result Date: 09/03/2021    ECHOCARDIOGRAM REPORT   Patient Name:   Brian Martin Date of Exam: 09/03/2021 Medical Rec #:  [id number]       Height:       73.0 in Accession #:    [id number]      Weight:       216.3 lb Date of Birth:  Sep 20, 1955       BSA:          2.224 m Patient Age:    65 years        BP:           135/78 mmHg Patient Gender: M               HR:  81 bpm. Exam Location:  Inpatient Procedure: 2D Echo, Cardiac Doppler and Color Doppler Indications:    CHF  History:        Patient has prior history of Echocardiogram examinations, most                 recent 05/05/2015. Arrythmias:Atrial Fibrillation; Risk                 Factors:Diabetes, Hypertension  and Dyslipidemia. CKD.  Sonographer:    Clayton Lefort RDCS (AE) Referring Phys: [id number] [place]  1. Left ventricular ejection fraction, by estimation, is 55 to 60%. The left ventricle has normal function. The left ventricle has no regional wall motion abnormalities. Left ventricular diastolic parameters were normal.  2. Right ventricular systolic function is normal. The right ventricular size is normal.  3. Left atrial size was mildly dilated.  4. The mitral valve is normal in structure. No evidence of mitral valve regurgitation. No evidence of mitral stenosis.  5. The aortic valve is normal in structure. Aortic valve regurgitation is not visualized. No aortic stenosis is present.  6. The inferior vena cava is dilated in size with <50% respiratory variability, suggesting right atrial pressure of 15 mmHg. FINDINGS  Left Ventricle: Left ventricular ejection fraction, by estimation, is 55 to 60%. The left ventricle has normal function. The left ventricle has no regional wall motion abnormalities. The left ventricular internal cavity size was normal in size. There is  no left ventricular hypertrophy. Left ventricular diastolic parameters were normal. Right Ventricle: The right ventricular size is normal. No increase in right ventricular wall thickness. Right ventricular systolic function is normal. Left Atrium: Left atrial size was mildly dilated. Right Atrium: Right atrial size was normal in size. Pericardium: There is no evidence of pericardial effusion. Mitral Valve: The mitral valve is normal in structure. Mild mitral annular calcification. No evidence of mitral valve regurgitation. No evidence of mitral valve stenosis. Tricuspid Valve: The tricuspid valve is normal in structure. Tricuspid valve regurgitation is not demonstrated. No evidence of tricuspid stenosis. Aortic Valve: The aortic valve is normal in structure. Aortic valve regurgitation is not visualized. No aortic stenosis is present.  Aortic valve mean gradient measures 2.0 mmHg. Aortic valve peak gradient measures 4.5 mmHg. Aortic valve area, by VTI measures 4.13 cm. Pulmonic Valve: The pulmonic valve was normal in structure. Pulmonic valve regurgitation is not visualized. No evidence of pulmonic stenosis. Aorta: The aortic root is normal in size and structure. Venous: The inferior vena cava is dilated in size with less than 50% respiratory variability, suggesting right atrial pressure of 15 mmHg. IAS/Shunts: No atrial level shunt detected by color flow Doppler.  LEFT VENTRICLE PLAX 2D LVIDd:         5.20 cm LVIDs:         3.70 cm LV PW:         1.30 cm LV IVS:        0.90 cm LVOT diam:     2.50 cm LV SV:         84 LV SV Index:   38 LVOT Area:     4.91 cm  RIGHT VENTRICLE             IVC RV Basal diam:  2.90 cm     IVC diam: 2.10 cm RV S prime:     13.80 cm/s TAPSE (M-mode): 1.6 cm LEFT ATRIUM             Index  RIGHT ATRIUM           Index LA diam:        4.30 cm 1.93 cm/m   RA Area:     13.70 cm LA Vol (A2C):   87.9 ml 39.52 ml/m  RA Volume:   28.30 ml  12.72 ml/m LA Vol (A4C):   82.8 ml 37.22 ml/m LA Biplane Vol: 88.7 ml 39.88 ml/m  AORTIC VALVE AV Area (Vmax):    4.21 cm AV Area (Vmean):   4.13 cm AV Area (VTI):     4.13 cm AV Vmax:           106.00 cm/s AV Vmean:          71.600 cm/s AV VTI:            0.203 m AV Peak Grad:      4.5 mmHg AV Mean Grad:      2.0 mmHg LVOT Vmax:         91.00 cm/s LVOT Vmean:        60.200 cm/s LVOT VTI:          0.171 m LVOT/AV VTI ratio: 0.84  AORTA Ao Root diam: 3.50 cm  SHUNTS Systemic VTI:  0.17 m Systemic Diam: 2.50 cm Candee Furbish MD Electronically signed by Candee Furbish MD Signature Date/Time: 09/03/2021/4:29:16 PM    Final       Subjective: Patient seen and examined at bedside.  Poor historian, very slow to respond.  No overnight fever, seizures, vomiting reported.  Discharge Exam: Vitals:   09/09/21 0745 09/09/21 0757  BP:  (!) 155/77  Pulse:  80  Resp:  15  Temp:  98.2 F  (36.8 C)  SpO2: 95% 100%    General: Pt is alert, awake, not in acute distress.  Very slow to respond.  Looks chronically ill.  Slightly confused. Cardiovascular: rate controlled, S1/S2 + Respiratory: bilateral decreased breath sounds at bases with some scattered crackles Abdominal: Soft, NT, ND, bowel sounds + Extremities: Trace right lower extremity edema present; no cyanosis.  Left BKA present    The results of significant diagnostics from this hospitalization (including imaging, microbiology, ancillary and laboratory) are listed below for reference.     Microbiology: Recent Results (from the past 240 hour(s))  Resp Panel by RT-PCR (Flu A&B, Covid) Nasopharyngeal Swab     Status: None   Collection Time: 08/30/21 11:47 AM   Specimen: Nasopharyngeal Swab; Nasopharyngeal(NP) swabs in vial transport medium  Result Value Ref Range Status   SARS Coronavirus 2 by RT PCR NEGATIVE NEGATIVE Final    Comment: (NOTE) SARS-CoV-2 target nucleic acids are NOT DETECTED.  The SARS-CoV-2 RNA is generally detectable in upper respiratory specimens during the acute phase of infection. The lowest concentration of SARS-CoV-2 viral copies this assay can detect is 138 copies/mL. A negative result does not preclude SARS-Cov-2 infection and should not be used as the sole basis for treatment or other patient management decisions. A negative result may occur with  improper specimen collection/handling, submission of specimen other than nasopharyngeal swab, presence of viral mutation(s) within the areas targeted by this assay, and inadequate number of viral copies(<138 copies/mL). A negative result must be combined with clinical observations, patient history, and epidemiological information. The expected result is Negative.  Fact Sheet for Patients:  EntrepreneurPulse.com.au  Fact Sheet for Healthcare Providers:  IncredibleEmployment.be  This test is no t yet  approved or cleared by the [place] and  has been authorized for  detection and/or diagnosis of SARS-CoV-2 by FDA under an Emergency Use Authorization (EUA). This EUA will remain  in effect (meaning this test can be used) for the duration of the COVID-19 declaration under Section 564(b)(1) of the Act, 21 U.S.C.section 360bbb-3(b)(1), unless the authorization is terminated  or revoked sooner.       Influenza A by PCR NEGATIVE NEGATIVE Final   Influenza B by PCR NEGATIVE NEGATIVE Final    Comment: (NOTE) The Xpert Xpress SARS-CoV-2/FLU/RSV plus assay is intended as an aid in the diagnosis of influenza from Nasopharyngeal swab specimens and should not be used as a sole basis for treatment. Nasal washings and aspirates are unacceptable for Xpert Xpress SARS-CoV-2/FLU/RSV testing.  Fact Sheet for Patients: EntrepreneurPulse.com.au  Fact Sheet for Healthcare Providers: IncredibleEmployment.be  This test is not yet approved or cleared by the [place] FDA and has been authorized for detection and/or diagnosis of SARS-CoV-2 by FDA under an Emergency Use Authorization (EUA). This EUA will remain in effect (meaning this test can be used) for the duration of the COVID-19 declaration under Section 564(b)(1) of the Act, 21 U.S.C. section 360bbb-3(b)(1), unless the authorization is terminated or revoked.  Performed at [facility], [place] [address], [place], [place] [address]   Blood culture (routine x 2)     Status: None   Collection Time: 08/30/21  4:34 PM   Specimen: BLOOD  Result Value Ref Range Status   Specimen Description BLOOD RIGHT ANTECUBITAL  Final   Special Requests   Final    BOTTLES DRAWN AEROBIC AND ANAEROBIC Blood Culture adequate volume   Culture   Final    NO GROWTH 5 DAYS Performed at [facility], [place] [address] [place], [place] [address]    Report Status 09/04/2021 FINAL  Final  Blood culture (routine x  2)     Status: None   Collection Time: 08/30/21  4:36 PM   Specimen: BLOOD  Result Value Ref Range Status   Specimen Description BLOOD LEFT ANTECUBITAL  Final   Special Requests   Final    BOTTLES DRAWN AEROBIC AND ANAEROBIC Blood Culture adequate volume   Culture   Final    NO GROWTH 5 DAYS Performed at [facility], [place] [address] [place], [place] [address]    Report Status 09/04/2021 FINAL  Final  MRSA Next Gen by PCR, Nasal     Status: Abnormal   Collection Time: 08/31/21  6:01 AM   Specimen: Nasal Mucosa; Nasal Swab  Result Value Ref Range Status   MRSA by PCR Next Gen DETECTED (A) NOT DETECTED Final    Comment: RESULT CALLED TO, READ BACK BY AND VERIFIED WITH: Earl Lites RN, AT [phone] 08/31/21 D. VANHOOK (NOTE) The GeneXpert MRSA Assay (FDA approved for NASAL specimens only), is one component of a comprehensive MRSA colonization surveillance program. It is not intended to diagnose MRSA infection nor to guide or monitor treatment for MRSA infections. Test performance is not FDA approved in patients less than 64 years old. Performed at [facility], [place] [address] [place], [place] [address]      Labs: BNP (last 3 results) Recent Labs    08/30/21 1213  BNP 671.2*   Basic Metabolic Panel: Recent Labs  Lab 09/04/21 0149 09/05/21 0157 09/05/21 1136 09/06/21 0338 09/07/21 0448 09/07/21 1312 09/07/21 1444 09/08/21 0315  NA 135 133*   < > 136 135 136 137 133*  K 3.4* 3.8   < >  3.9 3.7 4.4 5.0 3.7  CL 109 106  --  111 108 105  --  107  CO2 21* 19*  --  17* 20*  --   --  20*  GLUCOSE 134* 163*  --  175* 149* 195*  --  190*  BUN 20 21  --  21 19 18   --  20  CREATININE 1.31* 1.32*  --  1.19 1.17 1.00  --  1.10  CALCIUM 8.1* 7.9*  --  7.9* 8.0*  --   --  7.9*   < > = values in this interval not displayed.   Liver Function Tests: Recent Labs  Lab 09/03/21 0158  AST 20  ALT 17  ALKPHOS 43  BILITOT 0.9  PROT 5.7*  ALBUMIN 2.3*   No results for  input(s): LIPASE, AMYLASE in the last 168 hours. No results for input(s): AMMONIA in the last 168 hours. CBC: Recent Labs  Lab 09/03/21 0158 09/05/21 0157 09/05/21 1136 09/06/21 0338 09/07/21 0448 09/07/21 1312 09/07/21 1444 09/08/21 0315  WBC 11.8* 11.9*  --  9.9 10.0  --   --  12.5*  HGB 9.5* 8.6*   < > 9.3* 10.2* 10.9* 10.5* 8.1*  HCT 29.7* 27.0*   < > 28.9* 30.6* 32.0* 31.0* 24.6*  MCV 96.4 95.7  --  96.0 93.3  --   --  94.6  PLT 243 257  --  287 314  --   --  300   < > = values in this interval not displayed.   Cardiac Enzymes: No results for input(s): CKTOTAL, CKMB, CKMBINDEX, TROPONINI in the last 168 hours. BNP: Invalid input(s): POCBNP CBG: Recent Labs  Lab 09/08/21 0644 09/08/21 1216 09/08/21 1631 09/08/21 2126 09/09/21 0611  GLUCAP 155* 158* 166* 90 110*   D-Dimer No results for input(s): DDIMER in the last 72 hours. Hgb A1c No results for input(s): HGBA1C in the last 72 hours. Lipid Profile No results for input(s): CHOL, HDL, LDLCALC, TRIG, CHOLHDL, LDLDIRECT in the last 72 hours. Thyroid function studies No results for input(s): TSH, T4TOTAL, T3FREE, THYROIDAB in the last 72 hours.  Invalid input(s): FREET3 Anemia work up No results for input(s): VITAMINB12, FOLATE, FERRITIN, TIBC, IRON, RETICCTPCT in the last 72 hours. Urinalysis    Component Value Date/Time   COLORURINE YELLOW 08/30/2021 1540   APPEARANCEUR CLEAR 08/30/2021 1540   LABSPEC 1.025 08/30/2021 1540   PHURINE 5.5 08/30/2021 1540   GLUCOSEU NEGATIVE 08/30/2021 1540   HGBUR NEGATIVE 08/30/2021 1540   BILIRUBINUR NEGATIVE 08/30/2021 1540   KETONESUR NEGATIVE 08/30/2021 1540   PROTEINUR 30 (A) 08/30/2021 1540   UROBILINOGEN 0.2 06/30/2010 1037   NITRITE NEGATIVE 08/30/2021 1540   LEUKOCYTESUR NEGATIVE 08/30/2021 1540   Sepsis Labs Invalid input(s): PROCALCITONIN,  WBC,  LACTICIDVEN Microbiology Recent Results (from the past 240 hour(s))  Resp Panel by RT-PCR (Flu A&B, Covid)  Nasopharyngeal Swab     Status: None   Collection Time: 08/30/21 11:47 AM   Specimen: Nasopharyngeal Swab; Nasopharyngeal(NP) swabs in vial transport medium  Result Value Ref Range Status   SARS Coronavirus 2 by RT PCR NEGATIVE NEGATIVE Final    Comment: (NOTE) SARS-CoV-2 target nucleic acids are NOT DETECTED.  The SARS-CoV-2 RNA is generally detectable in upper respiratory specimens during the acute phase of infection. The lowest concentration of SARS-CoV-2 viral copies this assay can detect is 138 copies/mL. A negative result does not preclude SARS-Cov-2 infection and should not be used as the sole basis for treatment or  other patient management decisions. A negative result may occur with  improper specimen collection/handling, submission of specimen other than nasopharyngeal swab, presence of viral mutation(s) within the areas targeted by this assay, and inadequate number of viral copies(<138 copies/mL). A negative result must be combined with clinical observations, patient history, and epidemiological information. The expected result is Negative.  Fact Sheet for Patients:  EntrepreneurPulse.com.au  Fact Sheet for Healthcare Providers:  IncredibleEmployment.be  This test is no t yet approved or cleared by the [place] FDA and  has been authorized for detection and/or diagnosis of SARS-CoV-2 by FDA under an Emergency Use Authorization (EUA). This EUA will remain  in effect (meaning this test can be used) for the duration of the COVID-19 declaration under Section 564(b)(1) of the Act, 21 U.S.C.section 360bbb-3(b)(1), unless the authorization is terminated  or revoked sooner.       Influenza A by PCR NEGATIVE NEGATIVE Final   Influenza B by PCR NEGATIVE NEGATIVE Final    Comment: (NOTE) The Xpert Xpress SARS-CoV-2/FLU/RSV plus assay is intended as an aid in the diagnosis of influenza from Nasopharyngeal swab specimens and should not be  used as a sole basis for treatment. Nasal washings and aspirates are unacceptable for Xpert Xpress SARS-CoV-2/FLU/RSV testing.  Fact Sheet for Patients: EntrepreneurPulse.com.au  Fact Sheet for Healthcare Providers: IncredibleEmployment.be  This test is not yet approved or cleared by the [place] FDA and has been authorized for detection and/or diagnosis of SARS-CoV-2 by FDA under an Emergency Use Authorization (EUA). This EUA will remain in effect (meaning this test can be used) for the duration of the COVID-19 declaration under Section 564(b)(1) of the Act, 21 U.S.C. section 360bbb-3(b)(1), unless the authorization is terminated or revoked.  Performed at [facility], [place] [address], [place], [place] [address]   Blood culture (routine x 2)     Status: None   Collection Time: 08/30/21  4:34 PM   Specimen: BLOOD  Result Value Ref Range Status   Specimen Description BLOOD RIGHT ANTECUBITAL  Final   Special Requests   Final    BOTTLES DRAWN AEROBIC AND ANAEROBIC Blood Culture adequate volume   Culture   Final    NO GROWTH 5 DAYS Performed at [facility], [place] [address], [place], [place] [address]    Report Status 09/04/2021 FINAL  Final  Blood culture (routine x 2)     Status: None   Collection Time: 08/30/21  4:36 PM   Specimen: BLOOD  Result Value Ref Range Status   Specimen Description BLOOD LEFT ANTECUBITAL  Final   Special Requests   Final    BOTTLES DRAWN AEROBIC AND ANAEROBIC Blood Culture adequate volume   Culture   Final    NO GROWTH 5 DAYS Performed at [facility], [place] [address] [place], [place] [address]    Report Status 09/04/2021 FINAL  Final  MRSA Next Gen by PCR, Nasal     Status: Abnormal   Collection Time: 08/31/21  6:01 AM   Specimen: Nasal Mucosa; Nasal Swab  Result Value Ref Range Status   MRSA by PCR Next Gen DETECTED (A) NOT DETECTED Final    Comment: RESULT CALLED TO, READ BACK BY AND  VERIFIED WITH: Earl Lites RN, AT [phone] 08/31/21 D. VANHOOK (NOTE) The GeneXpert MRSA Assay (FDA approved for NASAL specimens only), is one component of a comprehensive MRSA colonization surveillance program. It is not intended to diagnose MRSA infection nor to guide or monitor treatment  for MRSA infections. Test performance is not FDA approved in patients less than 46 years old. Performed at [facility], [place] [address] [place], [place] [address]      Time coordinating discharge: 35 minutes  SIGNED:   Aline August, MD  [facility] 09/09/2021, 10:59 AM

## 2021-09-09 NOTE — Progress Notes (Signed)
Called report to [employer] and gave report to RN Myshia Turner. All questions answered. RN aware that may be 1-1.5 hours before patient in picked up by [facility]. Payton Emerald, RN

## 2021-09-09 NOTE — Progress Notes (Addendum)
[facility]  Subjective  - no new issues   Objective (!) 156/72 74 98.2 F (36.8 C) (Oral) 14 95%  Intake/Output Summary (Last 24 hours) at 09/09/2021 0757 Last data filed at 09/09/2021 0604 Gross per 24 hour  Intake 1249.1 ml  Output 1400 ml  Net -150.9 ml    Palpable right DP  Incision clean and dry, right groin soft without hematoma Medial malleolus wound dressing in place, leg wound open to air no drainage Lungs non labored breathing   Assessment/Planning: POD # 2 right fem-pop bypass with ipsilateral saphenous vein and Right iliofemoral endarterectomy secondary to PAD with tissue loss  Patent bypass with palpable DP pulse right LE Mobility encouraged PT/OT working with patient Continue ASA and Statin for maximum medical management F/U with [facility] in 2 weeks for wound check.  Appt. Sent to our office. Dr. Trula Slade recommended continue abx for another 5 days in his note dated 09/08/21.  He has been started on Augmentin.    Roxy Horseman 09/09/2021 7:57 AM --  Laboratory Lab Results: Recent Labs    09/07/21 0448 09/07/21 1312 09/07/21 1444 09/08/21 0315  WBC 10.0  --   --  12.5*  HGB 10.2*   < > 10.5* 8.1*  HCT 30.6*   < > 31.0* 24.6*  PLT 314  --   --  300   < > = values in this interval not displayed.   BMET Recent Labs    09/07/21 0448 09/07/21 1312 09/07/21 1444 09/08/21 0315  NA 135 136 137 133*  K 3.7 4.4 5.0 3.7  CL 108 105  --  107  CO2 20*  --   --  20*  GLUCOSE 149* 195*  --  190*  BUN 19 18  --  20  CREATININE 1.17 1.00  --  1.10  CALCIUM 8.0*  --   --  7.9*    COAG Lab Results  Component Value Date   INR 1.3 (H) 04/08/2021   INR 2.8 04/22/2020   INR 3.0 04/01/2020   No results found for: PTT

## 2021-09-12 DIAGNOSIS — I70233 Atherosclerosis of native arteries of right leg with ulceration of ankle: Secondary | ICD-10-CM | POA: Diagnosis not present

## 2021-09-12 DIAGNOSIS — L97819 Non-pressure chronic ulcer of other part of right lower leg with unspecified severity: Secondary | ICD-10-CM | POA: Diagnosis not present

## 2021-09-12 DIAGNOSIS — L97519 Non-pressure chronic ulcer of other part of right foot with unspecified severity: Secondary | ICD-10-CM | POA: Diagnosis not present

## 2021-09-12 DIAGNOSIS — I70235 Atherosclerosis of native arteries of right leg with ulceration of other part of foot: Secondary | ICD-10-CM | POA: Diagnosis not present

## 2021-09-15 DIAGNOSIS — L97518 Non-pressure chronic ulcer of other part of right foot with other specified severity: Secondary | ICD-10-CM | POA: Diagnosis not present

## 2021-09-15 DIAGNOSIS — I739 Peripheral vascular disease, unspecified: Secondary | ICD-10-CM | POA: Diagnosis not present

## 2021-09-15 DIAGNOSIS — E1122 Type 2 diabetes mellitus with diabetic chronic kidney disease: Secondary | ICD-10-CM | POA: Diagnosis not present

## 2021-09-15 DIAGNOSIS — I10 Essential (primary) hypertension: Secondary | ICD-10-CM | POA: Diagnosis not present

## 2021-09-19 DIAGNOSIS — L97819 Non-pressure chronic ulcer of other part of right lower leg with unspecified severity: Secondary | ICD-10-CM | POA: Diagnosis not present

## 2021-09-19 DIAGNOSIS — L97519 Non-pressure chronic ulcer of other part of right foot with unspecified severity: Secondary | ICD-10-CM | POA: Diagnosis not present

## 2021-09-19 DIAGNOSIS — I70233 Atherosclerosis of native arteries of right leg with ulceration of ankle: Secondary | ICD-10-CM | POA: Diagnosis not present

## 2021-09-19 DIAGNOSIS — I70235 Atherosclerosis of native arteries of right leg with ulceration of other part of foot: Secondary | ICD-10-CM | POA: Diagnosis not present

## 2021-09-20 IMAGING — CT CT ABD-PELV W/ CM
2 of 5 series · 16 of 46 positions shown, 18 images · IV contrast (omnipaque)
Comparison: None.

CLINICAL DATA: 65-year-old male with diarrhea.

EXAM:
CT ABDOMEN AND PELVIS WITH CONTRAST
TECHNIQUE: Multidetector CT imaging of the abdomen and pelvis was performed
using the standard protocol following bolus administration of
intravenous contrast.
CONTRAST:  70mL OMNIPAQUE IOHEXOL 300 MG/ML  SOLN

[Series 3: abd/ pelvis 5.0 i30f 2 · axial · 0.98mm/px · z∈[+818,+1288]mm · 13 of 106 slices shown, 15 images]
[im 6/106  soft-tissue]
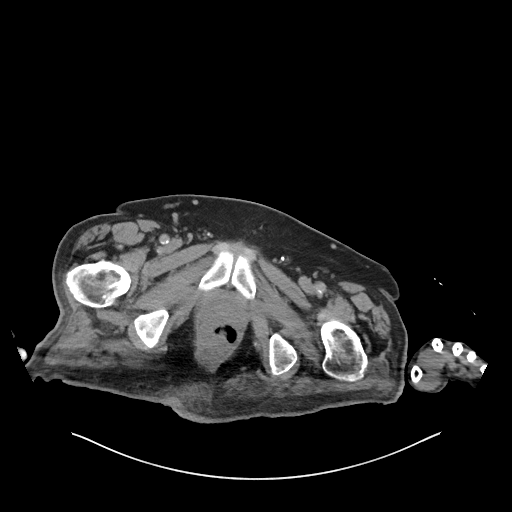
[im 6/106  bone]
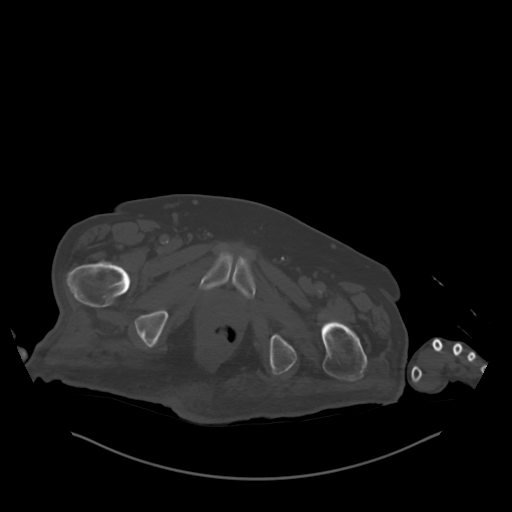
[im 16/106  soft-tissue]
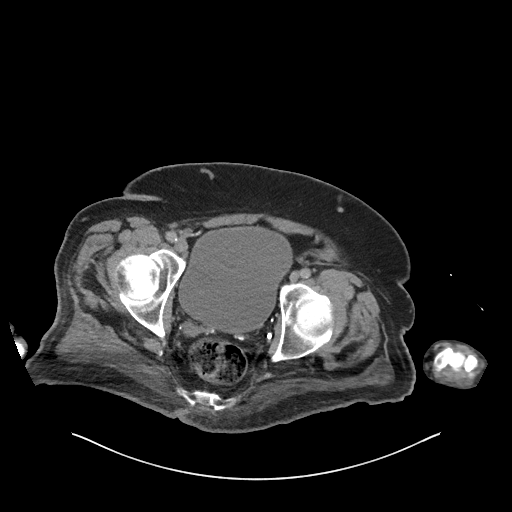
[im 22/106  soft-tissue]
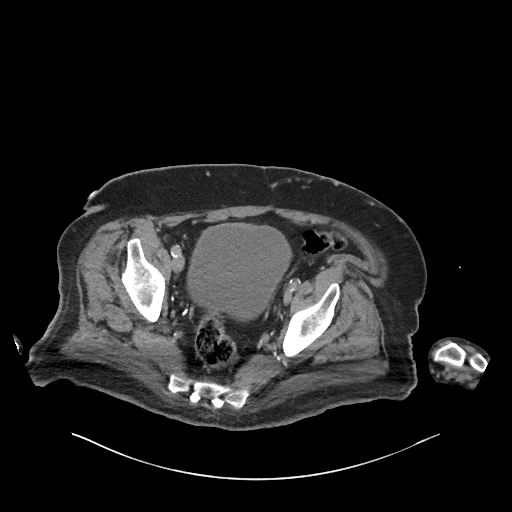
[im 32/106  soft-tissue]
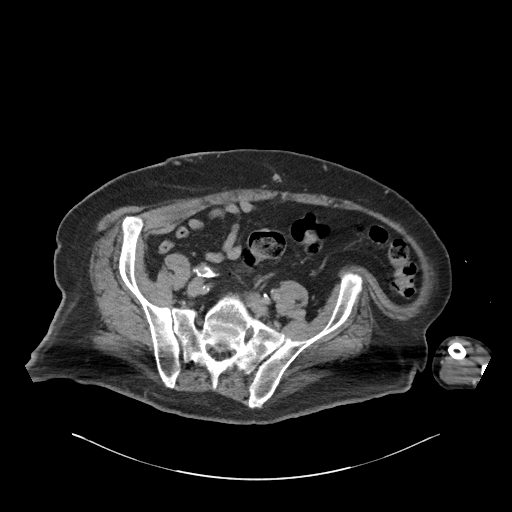
[im 37/106  soft-tissue]
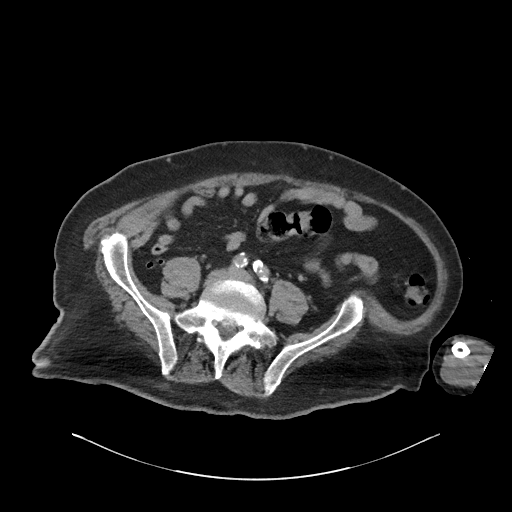
[im 48/106  soft-tissue]
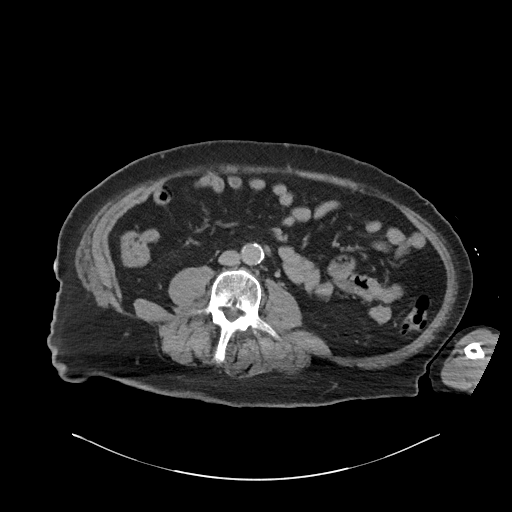
[im 53/106  soft-tissue]
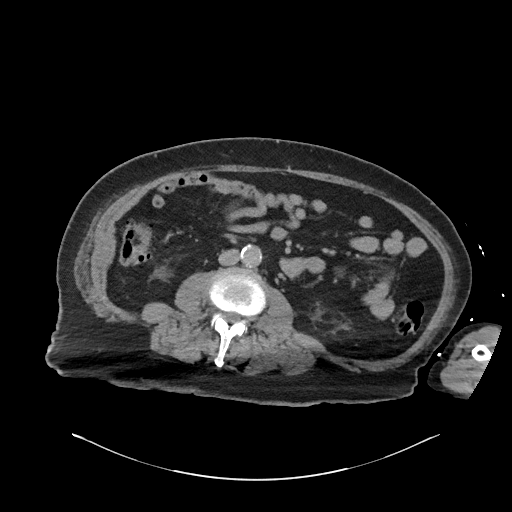
[im 58/106  soft-tissue]
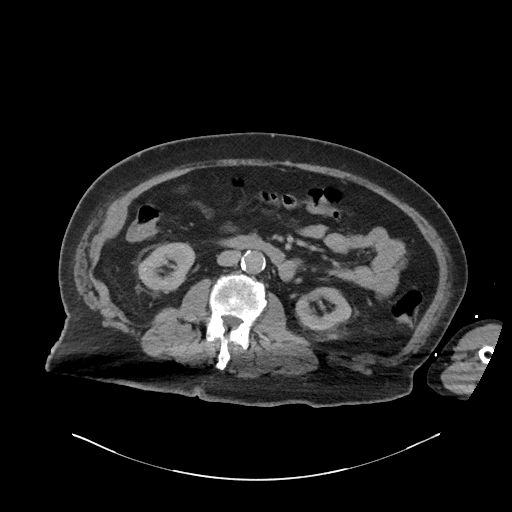
[im 69/106  soft-tissue]
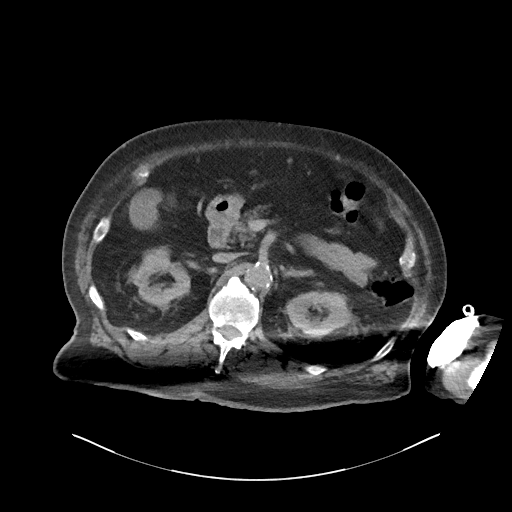
[im 69/106  bone]
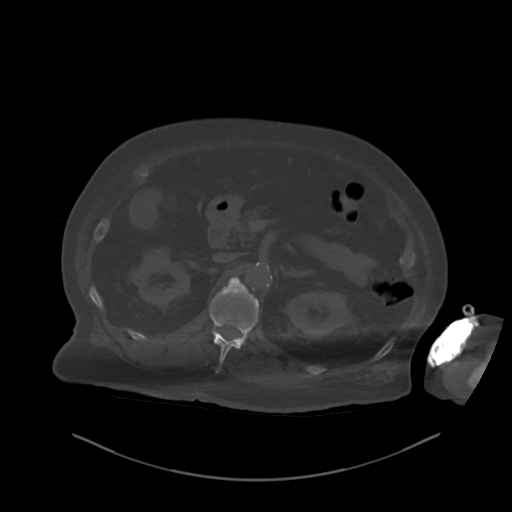
[im 74/106  soft-tissue]
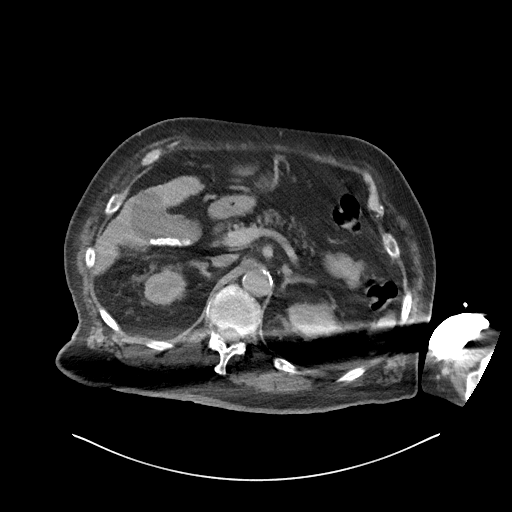
[im 85/106  soft-tissue]
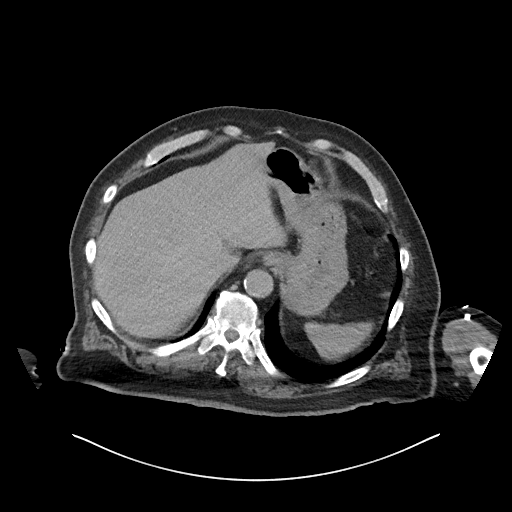
[im 90/106  soft-tissue]
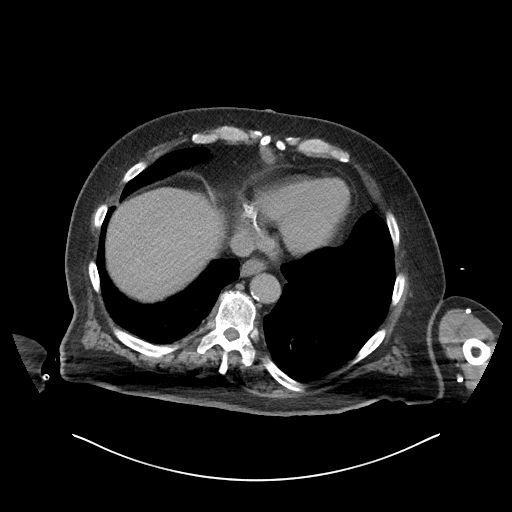
[im 100/106  soft-tissue]
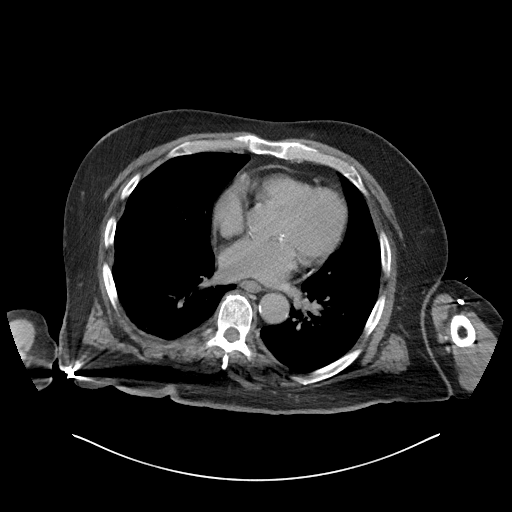

[Series 6: coronal soft tissue · coronal · 1.03mm/px · 3 of 101 slices shown]
[im 34/101  soft-tissue]
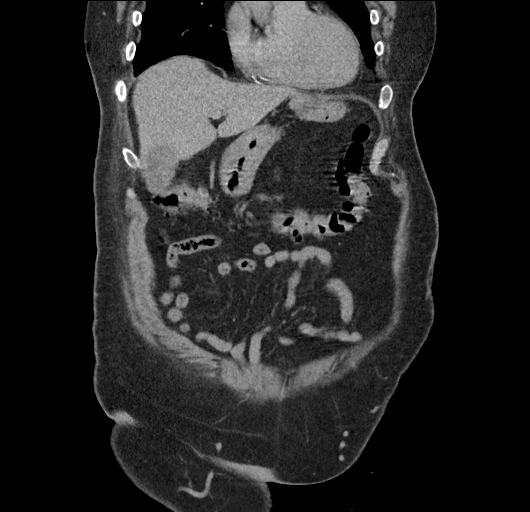
[im 45/101  soft-tissue]
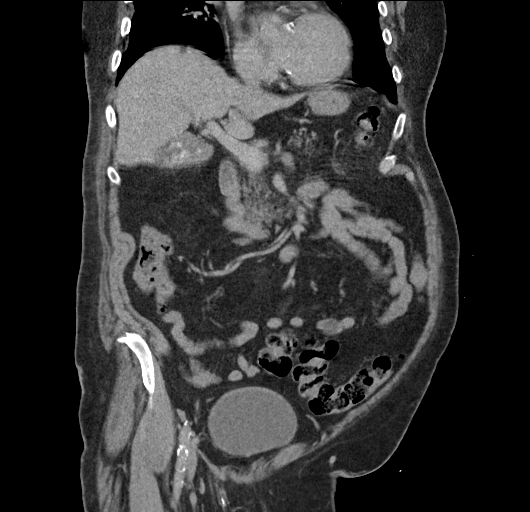
[im 56/101  soft-tissue]
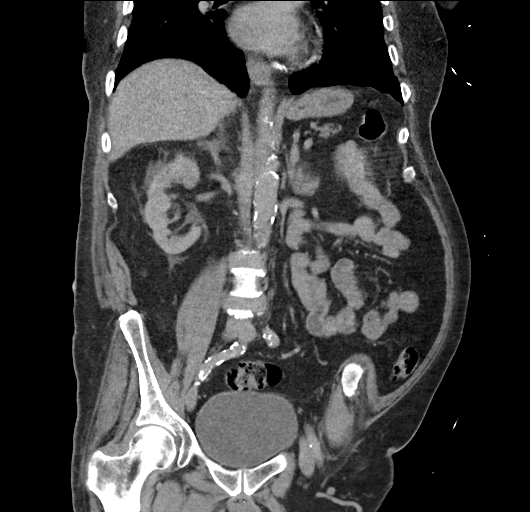

[16 of 46 positions shown; findings below may reference images not displayed]

FINDINGS: Lower chest: Calcified coronary artery atherosclerosis. No
cardiomegaly. There is mild pericardial wall calcification on series
5, image 13. No pericardial effusion. Mild lung base atelectasis or
scarring.

Hepatobiliary: Small layering gallstones (series 3, image 33). No
pericholecystic inflammation. Tiny 10 mm subcapsular hypodensity at
the liver dome is likely a benign cyst. Small calcified granuloma
also at the posterior dome. Otherwise negative liver.

Pancreas: Atrophied.

Spleen: Diminutive, negative.

Adrenals/Urinary Tract: Normal adrenal glands. Nonobstructed
kidneys. Renal vascular calcifications. No definite nephrolithiasis.
Bilateral renal cortical thinning with patchy perinephric stranding
which is likely chronic. Decompressed ureters. Mildly distended but
otherwise unremarkable bladder.

Stomach/Bowel: There is intermittent mild diverticulosis of the
large bowel, including at the hepatic flexure. But no active large
bowel inflammation is identified. Only mild retained stool. Normal
appendix on series 3, image 70. Decompressed and negative terminal
ileum. No dilated small bowel. Negative stomach and duodenum. No
free air, free fluid, mesenteric inflammation.

Vascular/Lymphatic: Extensive Aortoiliac calcified atherosclerosis.
Suboptimal intravascular contrast bolus but the major arterial
structures appear to be patent. Portal venous system appears patent.
No lymphadenopathy.

Reproductive: Negative.

Other: No pelvic free fluid.

Musculoskeletal: No acute osseous abnormality identified.

A portion of the left upper extremity is included, and at the left
antecubital fossa an oval 27 x 65 x 66 mm (AP by transverse by CC)
collection of extravasated contrast is visible, for an estimated
volume of 58 mL.
IMPRESSION: 1. No acute or inflammatory process identified in the abdomen or
pelvis.
Occasional large bowel diverticula without active inflammation.
Cholelithiasis without CT evidence of acute cholecystitis.
Calcified coronary artery and Aortic Atherosclerosis ([id number]).
Mild pericardial calcification without pericardial effusion.
Chronic medical renal disease is suspected.

2. Visible IV contrast extravasation at the left antecubital fossa
with an estimated volume of 58 mL. This should resolve with
conservative treatment (including ice, elevation). Recommend
monitoring for any skin changes over the next several days.

## 2021-09-22 DIAGNOSIS — M6281 Muscle weakness (generalized): Secondary | ICD-10-CM | POA: Diagnosis not present

## 2021-09-22 DIAGNOSIS — Z89512 Acquired absence of left leg below knee: Secondary | ICD-10-CM | POA: Diagnosis not present

## 2021-09-22 DIAGNOSIS — E1165 Type 2 diabetes mellitus with hyperglycemia: Secondary | ICD-10-CM | POA: Diagnosis not present

## 2021-09-22 DIAGNOSIS — I480 Paroxysmal atrial fibrillation: Secondary | ICD-10-CM | POA: Diagnosis not present

## 2021-09-22 DIAGNOSIS — N183 Chronic kidney disease, stage 3 unspecified: Secondary | ICD-10-CM | POA: Diagnosis not present

## 2021-09-22 DIAGNOSIS — D518 Other vitamin B12 deficiency anemias: Secondary | ICD-10-CM | POA: Diagnosis not present

## 2021-09-22 DIAGNOSIS — E038 Other specified hypothyroidism: Secondary | ICD-10-CM | POA: Diagnosis not present

## 2021-09-22 DIAGNOSIS — I739 Peripheral vascular disease, unspecified: Secondary | ICD-10-CM | POA: Diagnosis not present

## 2021-09-22 DIAGNOSIS — I5032 Chronic diastolic (congestive) heart failure: Secondary | ICD-10-CM | POA: Diagnosis not present

## 2021-09-22 DIAGNOSIS — E46 Unspecified protein-calorie malnutrition: Secondary | ICD-10-CM | POA: Diagnosis not present

## 2021-09-22 DIAGNOSIS — J45909 Unspecified asthma, uncomplicated: Secondary | ICD-10-CM | POA: Diagnosis not present

## 2021-09-22 DIAGNOSIS — E119 Type 2 diabetes mellitus without complications: Secondary | ICD-10-CM | POA: Diagnosis not present

## 2021-09-22 DIAGNOSIS — E559 Vitamin D deficiency, unspecified: Secondary | ICD-10-CM | POA: Diagnosis not present

## 2021-09-22 DIAGNOSIS — I70233 Atherosclerosis of native arteries of right leg with ulceration of ankle: Secondary | ICD-10-CM | POA: Diagnosis not present

## 2021-09-22 DIAGNOSIS — R296 Repeated falls: Secondary | ICD-10-CM | POA: Diagnosis not present

## 2021-09-22 DIAGNOSIS — I10 Essential (primary) hypertension: Secondary | ICD-10-CM | POA: Diagnosis not present

## 2021-09-23 DIAGNOSIS — E1165 Type 2 diabetes mellitus with hyperglycemia: Secondary | ICD-10-CM | POA: Diagnosis not present

## 2021-09-24 DIAGNOSIS — Y792 Prosthetic and other implants, materials and accessory orthopedic devices associated with adverse incidents: Secondary | ICD-10-CM | POA: Diagnosis not present

## 2021-09-24 DIAGNOSIS — L89892 Pressure ulcer of other site, stage 2: Secondary | ICD-10-CM | POA: Diagnosis not present

## 2021-09-24 DIAGNOSIS — Z794 Long term (current) use of insulin: Secondary | ICD-10-CM | POA: Diagnosis not present

## 2021-09-24 DIAGNOSIS — M6281 Muscle weakness (generalized): Secondary | ICD-10-CM | POA: Diagnosis not present

## 2021-09-24 DIAGNOSIS — E119 Type 2 diabetes mellitus without complications: Secondary | ICD-10-CM | POA: Diagnosis not present

## 2021-09-24 DIAGNOSIS — Z89512 Acquired absence of left leg below knee: Secondary | ICD-10-CM | POA: Diagnosis not present

## 2021-09-24 DIAGNOSIS — Z9181 History of falling: Secondary | ICD-10-CM | POA: Diagnosis not present

## 2021-09-26 DIAGNOSIS — M6281 Muscle weakness (generalized): Secondary | ICD-10-CM | POA: Diagnosis not present

## 2021-09-26 DIAGNOSIS — Y792 Prosthetic and other implants, materials and accessory orthopedic devices associated with adverse incidents: Secondary | ICD-10-CM | POA: Diagnosis not present

## 2021-09-26 DIAGNOSIS — Z794 Long term (current) use of insulin: Secondary | ICD-10-CM | POA: Diagnosis not present

## 2021-09-26 DIAGNOSIS — E119 Type 2 diabetes mellitus without complications: Secondary | ICD-10-CM | POA: Diagnosis not present

## 2021-09-26 DIAGNOSIS — Z89512 Acquired absence of left leg below knee: Secondary | ICD-10-CM | POA: Diagnosis not present

## 2021-09-26 DIAGNOSIS — L89892 Pressure ulcer of other site, stage 2: Secondary | ICD-10-CM | POA: Diagnosis not present

## 2021-09-26 DIAGNOSIS — Z9181 History of falling: Secondary | ICD-10-CM | POA: Diagnosis not present

## 2021-09-27 ENCOUNTER — Telehealth: Payer: Self-pay | Admitting: Hospice

## 2021-09-27 DIAGNOSIS — L89892 Pressure ulcer of other site, stage 2: Secondary | ICD-10-CM | POA: Diagnosis not present

## 2021-09-27 DIAGNOSIS — Y792 Prosthetic and other implants, materials and accessory orthopedic devices associated with adverse incidents: Secondary | ICD-10-CM | POA: Diagnosis not present

## 2021-09-27 DIAGNOSIS — M6281 Muscle weakness (generalized): Secondary | ICD-10-CM | POA: Diagnosis not present

## 2021-09-27 DIAGNOSIS — Z794 Long term (current) use of insulin: Secondary | ICD-10-CM | POA: Diagnosis not present

## 2021-09-27 DIAGNOSIS — E119 Type 2 diabetes mellitus without complications: Secondary | ICD-10-CM | POA: Diagnosis not present

## 2021-09-27 DIAGNOSIS — Z515 Encounter for palliative care: Secondary | ICD-10-CM

## 2021-09-27 DIAGNOSIS — Z89512 Acquired absence of left leg below knee: Secondary | ICD-10-CM | POA: Diagnosis not present

## 2021-09-27 DIAGNOSIS — Z9181 History of falling: Secondary | ICD-10-CM | POA: Diagnosis not present

## 2021-09-27 NOTE — Telephone Encounter (Signed)
NP called daughter - Ned Grace and left a voicemail with call back number; informing that NP went to [place] yesterday for initial palliative consult but patient was not at the facility nor listed as a patient there.

## 2021-09-28 DIAGNOSIS — E1165 Type 2 diabetes mellitus with hyperglycemia: Secondary | ICD-10-CM | POA: Diagnosis not present

## 2021-09-28 DIAGNOSIS — M6281 Muscle weakness (generalized): Secondary | ICD-10-CM | POA: Diagnosis not present

## 2021-09-28 DIAGNOSIS — E46 Unspecified protein-calorie malnutrition: Secondary | ICD-10-CM | POA: Diagnosis not present

## 2021-09-28 DIAGNOSIS — L89892 Pressure ulcer of other site, stage 2: Secondary | ICD-10-CM | POA: Diagnosis not present

## 2021-09-28 DIAGNOSIS — Z9181 History of falling: Secondary | ICD-10-CM | POA: Diagnosis not present

## 2021-09-28 DIAGNOSIS — L97121 Non-pressure chronic ulcer of left thigh limited to breakdown of skin: Secondary | ICD-10-CM | POA: Diagnosis not present

## 2021-09-28 DIAGNOSIS — E1122 Type 2 diabetes mellitus with diabetic chronic kidney disease: Secondary | ICD-10-CM | POA: Diagnosis not present

## 2021-09-28 DIAGNOSIS — Z48817 Encounter for surgical aftercare following surgery on the skin and subcutaneous tissue: Secondary | ICD-10-CM | POA: Diagnosis not present

## 2021-09-28 DIAGNOSIS — E782 Mixed hyperlipidemia: Secondary | ICD-10-CM | POA: Diagnosis not present

## 2021-09-28 DIAGNOSIS — I5032 Chronic diastolic (congestive) heart failure: Secondary | ICD-10-CM | POA: Diagnosis not present

## 2021-09-28 DIAGNOSIS — Z89512 Acquired absence of left leg below knee: Secondary | ICD-10-CM | POA: Diagnosis not present

## 2021-09-28 DIAGNOSIS — I480 Paroxysmal atrial fibrillation: Secondary | ICD-10-CM | POA: Diagnosis not present

## 2021-09-28 DIAGNOSIS — N183 Chronic kidney disease, stage 3 unspecified: Secondary | ICD-10-CM | POA: Diagnosis not present

## 2021-09-28 DIAGNOSIS — J45909 Unspecified asthma, uncomplicated: Secondary | ICD-10-CM | POA: Diagnosis not present

## 2021-09-28 DIAGNOSIS — Z794 Long term (current) use of insulin: Secondary | ICD-10-CM | POA: Diagnosis not present

## 2021-09-28 DIAGNOSIS — I130 Hypertensive heart and chronic kidney disease with heart failure and stage 1 through stage 4 chronic kidney disease, or unspecified chronic kidney disease: Secondary | ICD-10-CM | POA: Diagnosis not present

## 2021-09-28 DIAGNOSIS — K219 Gastro-esophageal reflux disease without esophagitis: Secondary | ICD-10-CM | POA: Diagnosis not present

## 2021-09-30 DIAGNOSIS — N183 Chronic kidney disease, stage 3 unspecified: Secondary | ICD-10-CM | POA: Diagnosis not present

## 2021-09-30 DIAGNOSIS — L89892 Pressure ulcer of other site, stage 2: Secondary | ICD-10-CM | POA: Diagnosis not present

## 2021-09-30 DIAGNOSIS — J45909 Unspecified asthma, uncomplicated: Secondary | ICD-10-CM | POA: Diagnosis not present

## 2021-09-30 DIAGNOSIS — I5032 Chronic diastolic (congestive) heart failure: Secondary | ICD-10-CM | POA: Diagnosis not present

## 2021-09-30 DIAGNOSIS — Z794 Long term (current) use of insulin: Secondary | ICD-10-CM | POA: Diagnosis not present

## 2021-09-30 DIAGNOSIS — K219 Gastro-esophageal reflux disease without esophagitis: Secondary | ICD-10-CM | POA: Diagnosis not present

## 2021-09-30 DIAGNOSIS — Z48817 Encounter for surgical aftercare following surgery on the skin and subcutaneous tissue: Secondary | ICD-10-CM | POA: Diagnosis not present

## 2021-09-30 DIAGNOSIS — E1122 Type 2 diabetes mellitus with diabetic chronic kidney disease: Secondary | ICD-10-CM | POA: Diagnosis not present

## 2021-09-30 DIAGNOSIS — E782 Mixed hyperlipidemia: Secondary | ICD-10-CM | POA: Diagnosis not present

## 2021-09-30 DIAGNOSIS — I130 Hypertensive heart and chronic kidney disease with heart failure and stage 1 through stage 4 chronic kidney disease, or unspecified chronic kidney disease: Secondary | ICD-10-CM | POA: Diagnosis not present

## 2021-09-30 DIAGNOSIS — E46 Unspecified protein-calorie malnutrition: Secondary | ICD-10-CM | POA: Diagnosis not present

## 2021-09-30 DIAGNOSIS — M6281 Muscle weakness (generalized): Secondary | ICD-10-CM | POA: Diagnosis not present

## 2021-09-30 DIAGNOSIS — I480 Paroxysmal atrial fibrillation: Secondary | ICD-10-CM | POA: Diagnosis not present

## 2021-09-30 DIAGNOSIS — E1165 Type 2 diabetes mellitus with hyperglycemia: Secondary | ICD-10-CM | POA: Diagnosis not present

## 2021-09-30 DIAGNOSIS — Z9181 History of falling: Secondary | ICD-10-CM | POA: Diagnosis not present

## 2021-09-30 DIAGNOSIS — Z89512 Acquired absence of left leg below knee: Secondary | ICD-10-CM | POA: Diagnosis not present

## 2021-10-03 ENCOUNTER — Telehealth: Payer: Self-pay

## 2021-10-03 ENCOUNTER — Ambulatory Visit (INDEPENDENT_AMBULATORY_CARE_PROVIDER_SITE_OTHER): Payer: Medicare Other | Admitting: Physician Assistant

## 2021-10-03 ENCOUNTER — Other Ambulatory Visit: Payer: Self-pay

## 2021-10-03 ENCOUNTER — Encounter: Payer: Self-pay | Admitting: Physician Assistant

## 2021-10-03 VITALS — BP 153/83 | HR 80 | Temp 98.0°F | Resp 20 | Ht 73.0 in

## 2021-10-03 DIAGNOSIS — Z794 Long term (current) use of insulin: Secondary | ICD-10-CM | POA: Diagnosis not present

## 2021-10-03 DIAGNOSIS — I739 Peripheral vascular disease, unspecified: Secondary | ICD-10-CM

## 2021-10-03 DIAGNOSIS — Z89512 Acquired absence of left leg below knee: Secondary | ICD-10-CM | POA: Diagnosis not present

## 2021-10-03 DIAGNOSIS — E782 Mixed hyperlipidemia: Secondary | ICD-10-CM | POA: Diagnosis not present

## 2021-10-03 DIAGNOSIS — N183 Chronic kidney disease, stage 3 unspecified: Secondary | ICD-10-CM | POA: Diagnosis not present

## 2021-10-03 DIAGNOSIS — L89892 Pressure ulcer of other site, stage 2: Secondary | ICD-10-CM | POA: Diagnosis not present

## 2021-10-03 DIAGNOSIS — E1165 Type 2 diabetes mellitus with hyperglycemia: Secondary | ICD-10-CM | POA: Diagnosis not present

## 2021-10-03 DIAGNOSIS — E1122 Type 2 diabetes mellitus with diabetic chronic kidney disease: Secondary | ICD-10-CM | POA: Diagnosis not present

## 2021-10-03 DIAGNOSIS — I480 Paroxysmal atrial fibrillation: Secondary | ICD-10-CM | POA: Diagnosis not present

## 2021-10-03 DIAGNOSIS — I5032 Chronic diastolic (congestive) heart failure: Secondary | ICD-10-CM | POA: Diagnosis not present

## 2021-10-03 DIAGNOSIS — I130 Hypertensive heart and chronic kidney disease with heart failure and stage 1 through stage 4 chronic kidney disease, or unspecified chronic kidney disease: Secondary | ICD-10-CM | POA: Diagnosis not present

## 2021-10-03 DIAGNOSIS — K219 Gastro-esophageal reflux disease without esophagitis: Secondary | ICD-10-CM | POA: Diagnosis not present

## 2021-10-03 DIAGNOSIS — E46 Unspecified protein-calorie malnutrition: Secondary | ICD-10-CM | POA: Diagnosis not present

## 2021-10-03 DIAGNOSIS — M6281 Muscle weakness (generalized): Secondary | ICD-10-CM | POA: Diagnosis not present

## 2021-10-03 DIAGNOSIS — Z48817 Encounter for surgical aftercare following surgery on the skin and subcutaneous tissue: Secondary | ICD-10-CM | POA: Diagnosis not present

## 2021-10-03 DIAGNOSIS — Z9181 History of falling: Secondary | ICD-10-CM | POA: Diagnosis not present

## 2021-10-03 DIAGNOSIS — J45909 Unspecified asthma, uncomplicated: Secondary | ICD-10-CM | POA: Diagnosis not present

## 2021-10-03 NOTE — Progress Notes (Signed)
POST OPERATIVE OFFICE NOTE    CC:  F/u for surgery  HPI:  This is a 66 y.o. male who is s/p arteriogram on 09/02/2021 by Dr. Scot Dock.  He subsequently underwent right iliofemoral endarterectomy and right femoral to above knee popliteal bypass with ipsilateral non reversed saphenous vein on 09/07/2021 by Dr. Trula Slade for right leg ulcer.    Pt returns today for follow up with his wife and daughter.  Pt does not have any complaints.  Daughter present says that the wound on the medial ankle has improved but she is unsure about the great toe wound.  He is not complaining of any pain in his foot.  His wife and daughter are concerned about the redness on his leg.   No Known Allergies  Current Outpatient Medications  Medication Sig Dispense Refill   albuterol (PROVENTIL) (2.5 MG/3ML) 0.083% nebulizer solution Take 2.5 mg by nebulization every 6 (six) hours as needed for wheezing or shortness of breath.     apixaban (ELIQUIS) 5 MG TABS tablet Take 1 tablet (5 mg total) by mouth 2 (two) times daily. NEEDS APPOINTMENT FOR FUTURE REFILLS 60 tablet 0   aspirin EC 81 MG EC tablet Take 1 tablet (81 mg total) by mouth daily. Swallow whole. 30 tablet 0   budesonide-formoterol (SYMBICORT) 160-4.5 MCG/ACT inhaler Inhale 2 puffs into the lungs 2 (two) times daily.     carvedilol (COREG) 25 MG tablet Take 25 mg by mouth 2 (two) times daily with a meal.     docusate sodium (COLACE) 100 MG capsule Take 1 capsule (100 mg total) by mouth 2 (two) times daily. 10 capsule 0   furosemide (LASIX) 20 MG tablet Take 1 tablet (20 mg total) by mouth daily. 30 tablet 6   HUMALOG KWIKPEN 100 UNIT/ML KwikPen Inject 3 Units into the skin 3 (three) times daily.     insulin glargine (LANTUS SOLOSTAR) 100 UNIT/ML Solostar Pen Inject 25 Units into the skin every morning.     lisinopril (PRINIVIL,ZESTRIL) 10 MG tablet Take 10 mg by mouth daily.     pantoprazole (PROTONIX) 40 MG tablet Take 1 tablet (40 mg total) by mouth daily. 30  tablet 5   rosuvastatin (CRESTOR) 20 MG tablet Take 1 tablet (20 mg total) by mouth daily. 30 tablet 0   sertraline (ZOLOFT) 25 MG tablet Take 25 mg by mouth daily.     vitamin C (ASCORBIC ACID) 250 MG tablet Take 250 mg by mouth daily.     No current facility-administered medications for this visit.     ROS:  See HPI  Physical Exam:  Today's Vitals   10/03/21 1421  BP: (!) 153/83  Pulse: 80  Resp: 20  Temp: 98 F (36.7 C)  TempSrc: Temporal  SpO2: 99%  Height: 6\' 1"  (1.854 m)   Body mass index is 28.53 kg/m.   Incision:  right groin and vein harvest incisions have healed nicely.  There is hematoma around the saphenectomy incisions. Extremities:  easily palpable right DP pulse.  Red rash around the right knee and lower leg.       Assessment/Plan:  This is a 66 y.o. male who is s/p: rteriogram on 09/02/2021 by Dr. Scot Dock.  He subsequently underwent right iliofemoral endarterectomy and right femoral to above knee popliteal bypass with ipsilateral non reversed saphenous vein on 09/07/2021 by Dr. Trula Slade for right leg ulcer.     -pt seen with Dr. Trula Slade.  Toe and ankle wound improving.  He does have a  palpable right DP pulse.  Will have him return in 8 weeks with RLE arterial duplex and ABI and wound check.   -as far as the rash, Dr. Trula Slade recommends medrol dose pack and this has been called in to his pharmacy.  -they will call sooner if there are any issues before then -continue asa/statin/eliquis   Leontine Locket, [facility] [phone]   Clinic MD:  pt seen with Dr. Trula Slade

## 2021-10-03 NOTE — Telephone Encounter (Signed)
I spoke with Arby Barrette with [facility] to provide a verbal prescription order for:  Medrol Dose Pack 4mg  Qnty: 21 Instructions: Take as Directed Provider: Leontine Locket, PA-C

## 2021-10-05 DIAGNOSIS — I5032 Chronic diastolic (congestive) heart failure: Secondary | ICD-10-CM | POA: Diagnosis not present

## 2021-10-05 DIAGNOSIS — E46 Unspecified protein-calorie malnutrition: Secondary | ICD-10-CM | POA: Diagnosis not present

## 2021-10-05 DIAGNOSIS — K219 Gastro-esophageal reflux disease without esophagitis: Secondary | ICD-10-CM | POA: Diagnosis not present

## 2021-10-05 DIAGNOSIS — I130 Hypertensive heart and chronic kidney disease with heart failure and stage 1 through stage 4 chronic kidney disease, or unspecified chronic kidney disease: Secondary | ICD-10-CM | POA: Diagnosis not present

## 2021-10-05 DIAGNOSIS — Z794 Long term (current) use of insulin: Secondary | ICD-10-CM | POA: Diagnosis not present

## 2021-10-05 DIAGNOSIS — E782 Mixed hyperlipidemia: Secondary | ICD-10-CM | POA: Diagnosis not present

## 2021-10-05 DIAGNOSIS — Z48817 Encounter for surgical aftercare following surgery on the skin and subcutaneous tissue: Secondary | ICD-10-CM | POA: Diagnosis not present

## 2021-10-05 DIAGNOSIS — I480 Paroxysmal atrial fibrillation: Secondary | ICD-10-CM | POA: Diagnosis not present

## 2021-10-05 DIAGNOSIS — Z89512 Acquired absence of left leg below knee: Secondary | ICD-10-CM | POA: Diagnosis not present

## 2021-10-05 DIAGNOSIS — N183 Chronic kidney disease, stage 3 unspecified: Secondary | ICD-10-CM | POA: Diagnosis not present

## 2021-10-05 DIAGNOSIS — E1165 Type 2 diabetes mellitus with hyperglycemia: Secondary | ICD-10-CM | POA: Diagnosis not present

## 2021-10-05 DIAGNOSIS — J45909 Unspecified asthma, uncomplicated: Secondary | ICD-10-CM | POA: Diagnosis not present

## 2021-10-05 DIAGNOSIS — Z9181 History of falling: Secondary | ICD-10-CM | POA: Diagnosis not present

## 2021-10-05 DIAGNOSIS — S91011D Laceration without foreign body, right ankle, subsequent encounter: Secondary | ICD-10-CM | POA: Diagnosis not present

## 2021-10-05 DIAGNOSIS — L89892 Pressure ulcer of other site, stage 2: Secondary | ICD-10-CM | POA: Diagnosis not present

## 2021-10-05 DIAGNOSIS — E1122 Type 2 diabetes mellitus with diabetic chronic kidney disease: Secondary | ICD-10-CM | POA: Diagnosis not present

## 2021-10-05 DIAGNOSIS — M6281 Muscle weakness (generalized): Secondary | ICD-10-CM | POA: Diagnosis not present

## 2021-10-06 ENCOUNTER — Other Ambulatory Visit: Payer: Self-pay | Admitting: *Deleted

## 2021-10-06 DIAGNOSIS — M6281 Muscle weakness (generalized): Secondary | ICD-10-CM | POA: Diagnosis not present

## 2021-10-06 DIAGNOSIS — Z794 Long term (current) use of insulin: Secondary | ICD-10-CM | POA: Diagnosis not present

## 2021-10-06 DIAGNOSIS — I480 Paroxysmal atrial fibrillation: Secondary | ICD-10-CM | POA: Diagnosis not present

## 2021-10-06 DIAGNOSIS — E1122 Type 2 diabetes mellitus with diabetic chronic kidney disease: Secondary | ICD-10-CM | POA: Diagnosis not present

## 2021-10-06 DIAGNOSIS — E1165 Type 2 diabetes mellitus with hyperglycemia: Secondary | ICD-10-CM | POA: Diagnosis not present

## 2021-10-06 DIAGNOSIS — Z9181 History of falling: Secondary | ICD-10-CM | POA: Diagnosis not present

## 2021-10-06 DIAGNOSIS — Z89512 Acquired absence of left leg below knee: Secondary | ICD-10-CM | POA: Diagnosis not present

## 2021-10-06 DIAGNOSIS — I130 Hypertensive heart and chronic kidney disease with heart failure and stage 1 through stage 4 chronic kidney disease, or unspecified chronic kidney disease: Secondary | ICD-10-CM | POA: Diagnosis not present

## 2021-10-06 DIAGNOSIS — Z48817 Encounter for surgical aftercare following surgery on the skin and subcutaneous tissue: Secondary | ICD-10-CM | POA: Diagnosis not present

## 2021-10-06 DIAGNOSIS — I739 Peripheral vascular disease, unspecified: Secondary | ICD-10-CM

## 2021-10-06 DIAGNOSIS — J45909 Unspecified asthma, uncomplicated: Secondary | ICD-10-CM | POA: Diagnosis not present

## 2021-10-06 DIAGNOSIS — E782 Mixed hyperlipidemia: Secondary | ICD-10-CM | POA: Diagnosis not present

## 2021-10-06 DIAGNOSIS — K219 Gastro-esophageal reflux disease without esophagitis: Secondary | ICD-10-CM | POA: Diagnosis not present

## 2021-10-06 DIAGNOSIS — N183 Chronic kidney disease, stage 3 unspecified: Secondary | ICD-10-CM | POA: Diagnosis not present

## 2021-10-06 DIAGNOSIS — I5032 Chronic diastolic (congestive) heart failure: Secondary | ICD-10-CM | POA: Diagnosis not present

## 2021-10-06 DIAGNOSIS — L89892 Pressure ulcer of other site, stage 2: Secondary | ICD-10-CM | POA: Diagnosis not present

## 2021-10-06 DIAGNOSIS — E46 Unspecified protein-calorie malnutrition: Secondary | ICD-10-CM | POA: Diagnosis not present

## 2021-10-07 DIAGNOSIS — I480 Paroxysmal atrial fibrillation: Secondary | ICD-10-CM | POA: Diagnosis not present

## 2021-10-07 DIAGNOSIS — K219 Gastro-esophageal reflux disease without esophagitis: Secondary | ICD-10-CM | POA: Diagnosis not present

## 2021-10-07 DIAGNOSIS — I130 Hypertensive heart and chronic kidney disease with heart failure and stage 1 through stage 4 chronic kidney disease, or unspecified chronic kidney disease: Secondary | ICD-10-CM | POA: Diagnosis not present

## 2021-10-07 DIAGNOSIS — E119 Type 2 diabetes mellitus without complications: Secondary | ICD-10-CM | POA: Diagnosis not present

## 2021-10-07 DIAGNOSIS — N183 Chronic kidney disease, stage 3 unspecified: Secondary | ICD-10-CM | POA: Diagnosis not present

## 2021-10-07 DIAGNOSIS — E46 Unspecified protein-calorie malnutrition: Secondary | ICD-10-CM | POA: Diagnosis not present

## 2021-10-07 DIAGNOSIS — Z9181 History of falling: Secondary | ICD-10-CM | POA: Diagnosis not present

## 2021-10-07 DIAGNOSIS — J45909 Unspecified asthma, uncomplicated: Secondary | ICD-10-CM | POA: Diagnosis not present

## 2021-10-07 DIAGNOSIS — E1165 Type 2 diabetes mellitus with hyperglycemia: Secondary | ICD-10-CM | POA: Diagnosis not present

## 2021-10-07 DIAGNOSIS — E038 Other specified hypothyroidism: Secondary | ICD-10-CM | POA: Diagnosis not present

## 2021-10-07 DIAGNOSIS — Z48817 Encounter for surgical aftercare following surgery on the skin and subcutaneous tissue: Secondary | ICD-10-CM | POA: Diagnosis not present

## 2021-10-07 DIAGNOSIS — E782 Mixed hyperlipidemia: Secondary | ICD-10-CM | POA: Diagnosis not present

## 2021-10-07 DIAGNOSIS — M6281 Muscle weakness (generalized): Secondary | ICD-10-CM | POA: Diagnosis not present

## 2021-10-07 DIAGNOSIS — D518 Other vitamin B12 deficiency anemias: Secondary | ICD-10-CM | POA: Diagnosis not present

## 2021-10-07 DIAGNOSIS — E559 Vitamin D deficiency, unspecified: Secondary | ICD-10-CM | POA: Diagnosis not present

## 2021-10-07 DIAGNOSIS — I5032 Chronic diastolic (congestive) heart failure: Secondary | ICD-10-CM | POA: Diagnosis not present

## 2021-10-07 DIAGNOSIS — E1122 Type 2 diabetes mellitus with diabetic chronic kidney disease: Secondary | ICD-10-CM | POA: Diagnosis not present

## 2021-10-07 DIAGNOSIS — Z89512 Acquired absence of left leg below knee: Secondary | ICD-10-CM | POA: Diagnosis not present

## 2021-10-07 DIAGNOSIS — L89892 Pressure ulcer of other site, stage 2: Secondary | ICD-10-CM | POA: Diagnosis not present

## 2021-10-07 DIAGNOSIS — Z794 Long term (current) use of insulin: Secondary | ICD-10-CM | POA: Diagnosis not present

## 2021-10-07 DIAGNOSIS — I10 Essential (primary) hypertension: Secondary | ICD-10-CM | POA: Diagnosis not present

## 2021-10-10 DIAGNOSIS — E46 Unspecified protein-calorie malnutrition: Secondary | ICD-10-CM | POA: Diagnosis not present

## 2021-10-10 DIAGNOSIS — Z9181 History of falling: Secondary | ICD-10-CM | POA: Diagnosis not present

## 2021-10-10 DIAGNOSIS — N183 Chronic kidney disease, stage 3 unspecified: Secondary | ICD-10-CM | POA: Diagnosis not present

## 2021-10-10 DIAGNOSIS — I5032 Chronic diastolic (congestive) heart failure: Secondary | ICD-10-CM | POA: Diagnosis not present

## 2021-10-10 DIAGNOSIS — E1122 Type 2 diabetes mellitus with diabetic chronic kidney disease: Secondary | ICD-10-CM | POA: Diagnosis not present

## 2021-10-10 DIAGNOSIS — L89892 Pressure ulcer of other site, stage 2: Secondary | ICD-10-CM | POA: Diagnosis not present

## 2021-10-10 DIAGNOSIS — I480 Paroxysmal atrial fibrillation: Secondary | ICD-10-CM | POA: Diagnosis not present

## 2021-10-10 DIAGNOSIS — E1165 Type 2 diabetes mellitus with hyperglycemia: Secondary | ICD-10-CM | POA: Diagnosis not present

## 2021-10-10 DIAGNOSIS — I130 Hypertensive heart and chronic kidney disease with heart failure and stage 1 through stage 4 chronic kidney disease, or unspecified chronic kidney disease: Secondary | ICD-10-CM | POA: Diagnosis not present

## 2021-10-10 DIAGNOSIS — J45909 Unspecified asthma, uncomplicated: Secondary | ICD-10-CM | POA: Diagnosis not present

## 2021-10-10 DIAGNOSIS — Z48817 Encounter for surgical aftercare following surgery on the skin and subcutaneous tissue: Secondary | ICD-10-CM | POA: Diagnosis not present

## 2021-10-10 DIAGNOSIS — K219 Gastro-esophageal reflux disease without esophagitis: Secondary | ICD-10-CM | POA: Diagnosis not present

## 2021-10-10 DIAGNOSIS — Z89512 Acquired absence of left leg below knee: Secondary | ICD-10-CM | POA: Diagnosis not present

## 2021-10-10 DIAGNOSIS — Z794 Long term (current) use of insulin: Secondary | ICD-10-CM | POA: Diagnosis not present

## 2021-10-10 DIAGNOSIS — E782 Mixed hyperlipidemia: Secondary | ICD-10-CM | POA: Diagnosis not present

## 2021-10-10 DIAGNOSIS — M6281 Muscle weakness (generalized): Secondary | ICD-10-CM | POA: Diagnosis not present

## 2021-10-11 DIAGNOSIS — Z9181 History of falling: Secondary | ICD-10-CM | POA: Diagnosis not present

## 2021-10-11 DIAGNOSIS — E782 Mixed hyperlipidemia: Secondary | ICD-10-CM | POA: Diagnosis not present

## 2021-10-11 DIAGNOSIS — I480 Paroxysmal atrial fibrillation: Secondary | ICD-10-CM | POA: Diagnosis not present

## 2021-10-11 DIAGNOSIS — E46 Unspecified protein-calorie malnutrition: Secondary | ICD-10-CM | POA: Diagnosis not present

## 2021-10-11 DIAGNOSIS — Z89512 Acquired absence of left leg below knee: Secondary | ICD-10-CM | POA: Diagnosis not present

## 2021-10-11 DIAGNOSIS — L89892 Pressure ulcer of other site, stage 2: Secondary | ICD-10-CM | POA: Diagnosis not present

## 2021-10-11 DIAGNOSIS — M6281 Muscle weakness (generalized): Secondary | ICD-10-CM | POA: Diagnosis not present

## 2021-10-11 DIAGNOSIS — K219 Gastro-esophageal reflux disease without esophagitis: Secondary | ICD-10-CM | POA: Diagnosis not present

## 2021-10-11 DIAGNOSIS — E1165 Type 2 diabetes mellitus with hyperglycemia: Secondary | ICD-10-CM | POA: Diagnosis not present

## 2021-10-11 DIAGNOSIS — I5032 Chronic diastolic (congestive) heart failure: Secondary | ICD-10-CM | POA: Diagnosis not present

## 2021-10-11 DIAGNOSIS — I130 Hypertensive heart and chronic kidney disease with heart failure and stage 1 through stage 4 chronic kidney disease, or unspecified chronic kidney disease: Secondary | ICD-10-CM | POA: Diagnosis not present

## 2021-10-11 DIAGNOSIS — Z48817 Encounter for surgical aftercare following surgery on the skin and subcutaneous tissue: Secondary | ICD-10-CM | POA: Diagnosis not present

## 2021-10-11 DIAGNOSIS — J45909 Unspecified asthma, uncomplicated: Secondary | ICD-10-CM | POA: Diagnosis not present

## 2021-10-11 DIAGNOSIS — N183 Chronic kidney disease, stage 3 unspecified: Secondary | ICD-10-CM | POA: Diagnosis not present

## 2021-10-11 DIAGNOSIS — E1122 Type 2 diabetes mellitus with diabetic chronic kidney disease: Secondary | ICD-10-CM | POA: Diagnosis not present

## 2021-10-11 DIAGNOSIS — Z794 Long term (current) use of insulin: Secondary | ICD-10-CM | POA: Diagnosis not present

## 2021-10-12 DIAGNOSIS — E1165 Type 2 diabetes mellitus with hyperglycemia: Secondary | ICD-10-CM | POA: Diagnosis not present

## 2021-10-12 DIAGNOSIS — I480 Paroxysmal atrial fibrillation: Secondary | ICD-10-CM | POA: Diagnosis not present

## 2021-10-12 DIAGNOSIS — M6281 Muscle weakness (generalized): Secondary | ICD-10-CM | POA: Diagnosis not present

## 2021-10-12 DIAGNOSIS — E1122 Type 2 diabetes mellitus with diabetic chronic kidney disease: Secondary | ICD-10-CM | POA: Diagnosis not present

## 2021-10-12 DIAGNOSIS — Z48817 Encounter for surgical aftercare following surgery on the skin and subcutaneous tissue: Secondary | ICD-10-CM | POA: Diagnosis not present

## 2021-10-12 DIAGNOSIS — E782 Mixed hyperlipidemia: Secondary | ICD-10-CM | POA: Diagnosis not present

## 2021-10-12 DIAGNOSIS — L89892 Pressure ulcer of other site, stage 2: Secondary | ICD-10-CM | POA: Diagnosis not present

## 2021-10-12 DIAGNOSIS — Z89512 Acquired absence of left leg below knee: Secondary | ICD-10-CM | POA: Diagnosis not present

## 2021-10-12 DIAGNOSIS — J45909 Unspecified asthma, uncomplicated: Secondary | ICD-10-CM | POA: Diagnosis not present

## 2021-10-12 DIAGNOSIS — E46 Unspecified protein-calorie malnutrition: Secondary | ICD-10-CM | POA: Diagnosis not present

## 2021-10-12 DIAGNOSIS — Z794 Long term (current) use of insulin: Secondary | ICD-10-CM | POA: Diagnosis not present

## 2021-10-12 DIAGNOSIS — I130 Hypertensive heart and chronic kidney disease with heart failure and stage 1 through stage 4 chronic kidney disease, or unspecified chronic kidney disease: Secondary | ICD-10-CM | POA: Diagnosis not present

## 2021-10-12 DIAGNOSIS — N183 Chronic kidney disease, stage 3 unspecified: Secondary | ICD-10-CM | POA: Diagnosis not present

## 2021-10-12 DIAGNOSIS — K219 Gastro-esophageal reflux disease without esophagitis: Secondary | ICD-10-CM | POA: Diagnosis not present

## 2021-10-12 DIAGNOSIS — I5032 Chronic diastolic (congestive) heart failure: Secondary | ICD-10-CM | POA: Diagnosis not present

## 2021-10-12 DIAGNOSIS — Z9181 History of falling: Secondary | ICD-10-CM | POA: Diagnosis not present

## 2021-10-14 DIAGNOSIS — I480 Paroxysmal atrial fibrillation: Secondary | ICD-10-CM | POA: Diagnosis not present

## 2021-10-14 DIAGNOSIS — Z79899 Other long term (current) drug therapy: Secondary | ICD-10-CM | POA: Diagnosis not present

## 2021-10-14 DIAGNOSIS — I5032 Chronic diastolic (congestive) heart failure: Secondary | ICD-10-CM | POA: Diagnosis not present

## 2021-10-14 DIAGNOSIS — E7849 Other hyperlipidemia: Secondary | ICD-10-CM | POA: Diagnosis not present

## 2021-10-14 DIAGNOSIS — Z48817 Encounter for surgical aftercare following surgery on the skin and subcutaneous tissue: Secondary | ICD-10-CM | POA: Diagnosis not present

## 2021-10-14 DIAGNOSIS — N183 Chronic kidney disease, stage 3 unspecified: Secondary | ICD-10-CM | POA: Diagnosis not present

## 2021-10-14 DIAGNOSIS — E46 Unspecified protein-calorie malnutrition: Secondary | ICD-10-CM | POA: Diagnosis not present

## 2021-10-14 DIAGNOSIS — M6281 Muscle weakness (generalized): Secondary | ICD-10-CM | POA: Diagnosis not present

## 2021-10-14 DIAGNOSIS — L89892 Pressure ulcer of other site, stage 2: Secondary | ICD-10-CM | POA: Diagnosis not present

## 2021-10-14 DIAGNOSIS — E782 Mixed hyperlipidemia: Secondary | ICD-10-CM | POA: Diagnosis not present

## 2021-10-14 DIAGNOSIS — E1122 Type 2 diabetes mellitus with diabetic chronic kidney disease: Secondary | ICD-10-CM | POA: Diagnosis not present

## 2021-10-14 DIAGNOSIS — J45909 Unspecified asthma, uncomplicated: Secondary | ICD-10-CM | POA: Diagnosis not present

## 2021-10-14 DIAGNOSIS — K219 Gastro-esophageal reflux disease without esophagitis: Secondary | ICD-10-CM | POA: Diagnosis not present

## 2021-10-14 DIAGNOSIS — I130 Hypertensive heart and chronic kidney disease with heart failure and stage 1 through stage 4 chronic kidney disease, or unspecified chronic kidney disease: Secondary | ICD-10-CM | POA: Diagnosis not present

## 2021-10-14 DIAGNOSIS — E119 Type 2 diabetes mellitus without complications: Secondary | ICD-10-CM | POA: Diagnosis not present

## 2021-10-14 DIAGNOSIS — Z89512 Acquired absence of left leg below knee: Secondary | ICD-10-CM | POA: Diagnosis not present

## 2021-10-14 DIAGNOSIS — D518 Other vitamin B12 deficiency anemias: Secondary | ICD-10-CM | POA: Diagnosis not present

## 2021-10-14 DIAGNOSIS — Z9181 History of falling: Secondary | ICD-10-CM | POA: Diagnosis not present

## 2021-10-14 DIAGNOSIS — Z794 Long term (current) use of insulin: Secondary | ICD-10-CM | POA: Diagnosis not present

## 2021-10-14 DIAGNOSIS — E1165 Type 2 diabetes mellitus with hyperglycemia: Secondary | ICD-10-CM | POA: Diagnosis not present

## 2021-10-15 ENCOUNTER — Emergency Department (HOSPITAL_COMMUNITY): Payer: Medicare Other

## 2021-10-15 ENCOUNTER — Encounter (HOSPITAL_COMMUNITY): Payer: Self-pay

## 2021-10-15 ENCOUNTER — Other Ambulatory Visit: Payer: Self-pay

## 2021-10-15 ENCOUNTER — Emergency Department (HOSPITAL_COMMUNITY)
Admission: EM | Admit: 2021-10-15 | Discharge: 2021-10-15 | Disposition: A | Discharge status: Skilled Nursing Facility | Payer: Medicare Other | Attending: Emergency Medicine | Admitting: Emergency Medicine

## 2021-10-15 DIAGNOSIS — Z743 Need for continuous supervision: Secondary | ICD-10-CM | POA: Diagnosis not present

## 2021-10-15 DIAGNOSIS — Z7982 Long term (current) use of aspirin: Secondary | ICD-10-CM | POA: Diagnosis not present

## 2021-10-15 DIAGNOSIS — Z794 Long term (current) use of insulin: Secondary | ICD-10-CM | POA: Diagnosis not present

## 2021-10-15 DIAGNOSIS — R404 Transient alteration of awareness: Secondary | ICD-10-CM | POA: Diagnosis not present

## 2021-10-15 DIAGNOSIS — Z20822 Contact with and (suspected) exposure to covid-19: Secondary | ICD-10-CM | POA: Diagnosis not present

## 2021-10-15 DIAGNOSIS — I499 Cardiac arrhythmia, unspecified: Secondary | ICD-10-CM | POA: Diagnosis not present

## 2021-10-15 DIAGNOSIS — F0390 Unspecified dementia without behavioral disturbance: Secondary | ICD-10-CM | POA: Insufficient documentation

## 2021-10-15 DIAGNOSIS — Z7901 Long term (current) use of anticoagulants: Secondary | ICD-10-CM | POA: Insufficient documentation

## 2021-10-15 DIAGNOSIS — N3000 Acute cystitis without hematuria: Secondary | ICD-10-CM | POA: Diagnosis not present

## 2021-10-15 DIAGNOSIS — I10 Essential (primary) hypertension: Secondary | ICD-10-CM | POA: Diagnosis not present

## 2021-10-15 DIAGNOSIS — R279 Unspecified lack of coordination: Secondary | ICD-10-CM | POA: Diagnosis not present

## 2021-10-15 DIAGNOSIS — R4182 Altered mental status, unspecified: Secondary | ICD-10-CM | POA: Diagnosis present

## 2021-10-15 DIAGNOSIS — R6889 Other general symptoms and signs: Secondary | ICD-10-CM | POA: Diagnosis not present

## 2021-10-15 LAB — COMPREHENSIVE METABOLIC PANEL
ALT: 13 U/L (ref 0–44)
AST: 18 U/L (ref 15–41)
Albumin: 3.2 g/dL — ABNORMAL LOW (ref 3.5–5.0)
Alkaline Phosphatase: 68 U/L (ref 38–126)
Anion gap: 9 (ref 5–15)
BUN: 14 mg/dL (ref 8–23)
CO2: 27 mmol/L (ref 22–32)
Calcium: 8.9 mg/dL (ref 8.9–10.3)
Chloride: 101 mmol/L (ref 98–111)
Creatinine, Ser: 1.11 mg/dL (ref 0.61–1.24)
GFR, Estimated: >60 mL/min (ref >60)
Glucose, Bld: 148 mg/dL — ABNORMAL HIGH (ref 70–99)
Potassium: 3.8 mmol/L (ref 3.5–5.1)
Sodium: 137 mmol/L (ref 135–145)
Total Bilirubin: 0.3 mg/dL (ref 0.3–1.2)
Total Protein: 7.1 g/dL (ref 6.5–8.1)

## 2021-10-15 LAB — CBC WITH DIFFERENTIAL/PLATELET
Abs Immature Granulocytes: 0.04 10*3/uL (ref 0.00–0.07)
Basophils Absolute: 0.1 10*3/uL (ref 0.0–0.1)
Basophils Relative: 1 %
Eosinophils Absolute: 0.6 10*3/uL — ABNORMAL HIGH (ref 0.0–0.5)
Eosinophils Relative: 7 %
HCT: 40.9 % (ref 39.0–52.0)
Hemoglobin: 13 g/dL (ref 13.0–17.0)
Immature Granulocytes: 1 %
Lymphocytes Relative: 18 %
Lymphs Abs: 1.4 10*3/uL (ref 0.7–4.0)
MCH: 30 pg (ref 26.0–34.0)
MCHC: 31.8 g/dL (ref 30.0–36.0)
MCV: 94.5 fL (ref 80.0–100.0)
Monocytes Absolute: 0.8 10*3/uL (ref 0.1–1.0)
Monocytes Relative: 10 %
Neutro Abs: 5.3 10*3/uL (ref 1.7–7.7)
Neutrophils Relative %: 63 %
Platelets: 312 10*3/uL (ref 150–400)
RBC: 4.33 MIL/uL (ref 4.22–5.81)
RDW: 14.5 % (ref 11.5–15.5)
WBC: 8.2 10*3/uL (ref 4.0–10.5)
nRBC: 0 % (ref 0.0–0.2)

## 2021-10-15 LAB — URINALYSIS, ROUTINE W REFLEX MICROSCOPIC
Bilirubin Urine: NEGATIVE
Glucose, UA: NEGATIVE mg/dL
Hgb urine dipstick: NEGATIVE
Ketones, ur: NEGATIVE mg/dL
Nitrite: POSITIVE — AB
Protein, ur: 100 mg/dL — AB
Specific Gravity, Urine: 1.012 (ref 1.005–1.030)
WBC, UA: >50 WBC/hpf — ABNORMAL HIGH (ref 0–5)
pH: 9 — ABNORMAL HIGH (ref 5.0–8.0)

## 2021-10-15 LAB — I-STAT VENOUS BLOOD GAS, ED
Acid-Base Excess: 4 mmol/L — ABNORMAL HIGH (ref 0.0–2.0)
Bicarbonate: 30.3 mmol/L — ABNORMAL HIGH (ref 20.0–28.0)
Calcium, Ion: 1.2 mmol/L (ref 1.15–1.40)
HCT: 40 % (ref 39.0–52.0)
Hemoglobin: 13.6 g/dL (ref 13.0–17.0)
O2 Saturation: 39 %
Potassium: 3.7 mmol/L (ref 3.5–5.1)
Sodium: 139 mmol/L (ref 135–145)
TCO2: 32 mmol/L (ref 22–32)
pCO2, Ven: 53.5 mmHg (ref 44–60)
pH, Ven: 7.362 (ref 7.25–7.43)
pO2, Ven: 24 mmHg — CL (ref 32–45)

## 2021-10-15 LAB — CBG MONITORING, ED: Glucose-Capillary: 124 mg/dL — ABNORMAL HIGH (ref 70–99)

## 2021-10-15 LAB — RAPID URINE DRUG SCREEN, HOSP PERFORMED
Amphetamines: NOT DETECTED
Barbiturates: NOT DETECTED
Benzodiazepines: NOT DETECTED
Cocaine: NOT DETECTED
Opiates: NOT DETECTED
Tetrahydrocannabinol: NOT DETECTED

## 2021-10-15 LAB — AMMONIA: Ammonia: <10 umol/L (ref 9–35)

## 2021-10-15 LAB — LACTIC ACID, PLASMA: Lactic Acid, Venous: 1.3 mmol/L (ref 0.5–1.9)

## 2021-10-15 LAB — RESP PANEL BY RT-PCR (FLU A&B, COVID) ARPGX2
Influenza A by PCR: NEGATIVE
Influenza B by PCR: NEGATIVE
SARS Coronavirus 2 by RT PCR: NEGATIVE

## 2021-10-15 MED ORDER — CEFTRIAXONE SODIUM 1 G IJ SOLR
1.0000 g | Freq: Once | INTRAMUSCULAR | Status: AC
  Administered 2021-10-15: 1 g via INTRAVENOUS
  Filled 2021-10-15: qty 10

## 2021-10-15 MED ORDER — LACTATED RINGERS IV BOLUS
500.0000 mL | Freq: Once | INTRAVENOUS | Status: AC
  Administered 2021-10-15: 500 mL via INTRAVENOUS

## 2021-10-15 MED ORDER — LORAZEPAM 2 MG/ML IJ SOLN
1.0000 mg | Freq: Once | INTRAMUSCULAR | Status: AC
  Administered 2021-10-15: 1 mg via INTRAVENOUS
  Filled 2021-10-15: qty 1

## 2021-10-15 MED ORDER — CEPHALEXIN 500 MG PO CAPS: 500.0000 mg | ORAL_CAPSULE | Freq: Three times a day (TID) | ORAL | 0 refills | Status: AC

## 2021-10-15 NOTE — ED Notes (Signed)
This RN provided daughter with update regarding patient status and plan of care.

## 2021-10-15 NOTE — ED Notes (Signed)
Daughter [name] [phone] would like an update asap

## 2021-10-15 NOTE — ED Provider Notes (Signed)
Transfer of Care Note I assumed care of Brian Martin on 10/15/2021 at 7:46 PM  Briefly, Brian Martin is a 66 y.o. male who: PMHx: Cognitive decline, CAD, T2DM, pAF (on eliquis), chronic diastolic CHF, HTN, CKD 3, PVD s/p L BKA, P/w AMS Labs: No leukocytosis, no anemia, electrolyte abnormality, no AKI, no elevated LFTs, no lactic acidosis, no hyperammonemia, BG 124, VBG with pH 7.362, PCO2 53.5, COVID/flu negative CXR unremarkable CT head: No acute intracranial abnormality, no acute changes from prior EKG sinus rhythm with multiple premature complexes, no appreciable ST elevation or ST depression  Plan at the time of handoff: UDS, UA, blood cultures pending   Please refer to the original providers note for additional information regarding the care of [name].  Reassessment: I personally reassessed the patient:  Vital Signs:  The most current vitals were Blood pressure (!) 182/96, pulse 79, temperature 98.3 F (36.8 C), temperature source Oral, resp. rate 16, height 6\' 1"  (1.854 m), weight 98.1 kg, SpO2 100 %.   Hemodynamics:  The patient is hemodynamically stable. Mental Status:  The patient is alert  Additional MDM: UA revealed UTI Will give dose of ceftriaxone in the ED Plan to discharge with Keflex TID x 7d UTI [name] (daughter) updated via phone, all questions answered at this time.  She comments that she is "not surprised he has urinary tract infection" as he "often gets confused when this happens." Patient stable to discharge back to facility  Patient seen in conjunction with Dr. Langston Masker  Electronically signed by:  Wynetta Fines, MD on 10/15/2021 at  7:46 PM  Clinical Impression:  1. Acute cystitis without hematuria     Dispo: Discharge     Wynetta Fines, MD 10/15/21 1947    Wyvonnia Dusky, MD 10/16/21 1021

## 2021-10-15 NOTE — ED Notes (Signed)
Pt refuses to keep cardiac monitor on. Pt educated about the importance of keeping the monitor on.

## 2021-10-15 NOTE — ED Notes (Signed)
Pt continues to pull off equipment. RN educated pt on the importance of the monitor. RN helped sit pt up in bed to help with comfort. Pt refuses to have monitor on.

## 2021-10-15 NOTE — ED Triage Notes (Signed)
Pt arrived via [place] from [facility] d/t family wanting pt sent out because he isn't acting right. Staff told EMS that pt was refusing meds and care, which pt denies. Pt has hx of dementia. Pt had no complaints w/ EMS. VSS.

## 2021-10-15 NOTE — ED Notes (Signed)
RN and tech rubbed lotion on pt to help with dry skin and itchiness.

## 2021-10-15 NOTE — ED Notes (Signed)
Pt transported to CT ?

## 2021-10-15 NOTE — ED Notes (Signed)
[facility] called  

## 2021-10-15 NOTE — ED Provider Notes (Signed)
[facility] EMERGENCY DEPARTMENT Provider Note  CSN: [id number] Arrival Date & Time: 10/15/21  1236   History/HPI:  Chief Complaint  Patient presents with   Altered Mental Status    Brian Martin who presents today with complaint of AMS.  HPI obtained from family and EMS.  The patient was unable to provide any meaningful history secondary to dementia.  Home Meds: Prior to Admission medications   Medication Sig Start Date End Date Taking? Authorizing Provider  cephALEXin (KEFLEX) 500 MG capsule Take 1 capsule (500 mg total) by mouth 3 (three) times daily for 7 days. 10/15/21 10/22/21 Yes Wynetta Fines, MD  albuterol (PROVENTIL) (2.5 MG/3ML) 0.083% nebulizer solution Take 2.5 mg by nebulization every 6 (six) hours as needed for wheezing or shortness of breath.    [provider]  apixaban (ELIQUIS) 5 MG TABS tablet Take 1 tablet (5 mg total) by mouth 2 (two) times daily. NEEDS APPOINTMENT FOR FUTURE REFILLS 10/25/20   Bensimhon, Shaune Pascal, MD  aspirin EC 81 MG EC tablet Take 1 tablet (81 mg total) by mouth daily. Swallow whole. 09/10/21   Aline August, MD  budesonide-formoterol (SYMBICORT) 160-4.5 MCG/ACT inhaler Inhale 2 puffs into the lungs 2 (two) times daily.    [provider]  carvedilol (COREG) 25 MG tablet Take 25 mg by mouth 2 (two) times daily with a meal.    [provider]  docusate sodium (COLACE) 100 MG capsule Take 1 capsule (100 mg total) by mouth 2 (two) times daily. 02/21/16   Kelvin Cellar, MD  furosemide (LASIX) 20 MG tablet Take 1 tablet (20 mg total) by mouth daily. 07/03/14   Rande Brunt, CRNA  HUMALOG KWIKPEN 100 UNIT/ML KwikPen Inject 3 Units into the skin 3 (three) times daily. 08/25/21   [provider]  insulin glargine (LANTUS SOLOSTAR) 100 UNIT/ML Solostar Pen Inject 25 Units into the skin every morning.    [provider]  lisinopril (PRINIVIL,ZESTRIL) 10 MG tablet Take 10 mg by mouth daily.     [provider]  pantoprazole (PROTONIX) 40 MG tablet Take 1 tablet (40 mg total) by mouth daily. 06/16/13   Ivor Costa, MD  rosuvastatin (CRESTOR) 20 MG tablet Take 1 tablet (20 mg total) by mouth daily. 09/10/21   Aline August, MD  sertraline (ZOLOFT) 25 MG tablet Take 25 mg by mouth daily.    [provider]  vitamin C (ASCORBIC ACID) 250 MG tablet Take 250 mg by mouth daily.    [provider]    Allergies: Patient has no known allergies.  ROS: Review of Systems  Unable to perform ROS: Dementia   Physical Exam: Physical Exam Constitutional:      General: He is not in acute distress.    Appearance: Normal appearance. He is not ill-appearing.  HENT:     Head: Normocephalic and atraumatic.  Eyes:     Pupils: Pupils are equal, round, and reactive to light.  Cardiovascular:     Rate and Rhythm: Normal rate.  Pulmonary:     Effort: Pulmonary effort is normal. No respiratory distress.  Abdominal:     General: Abdomen is flat.     Palpations: Abdomen is soft.     Tenderness: There is no abdominal tenderness.  Musculoskeletal:        General: Normal range of motion.     Cervical back: Normal range of motion.  Skin:    General: Skin is warm and dry.  Neurological:  General: No focal deficit present.     Mental Status: He is alert.     Cranial Nerves: No cranial nerve deficit.     Sensory: No sensory deficit.    Procedures: Procedures   MDM/ED Course: This patient presents to the ED for concern of altered mental status, this involves an extensive number of treatment options, and is a complaint that carries with it a high risk of complications and morbidity.  The differential diagnosis includes dementia, infection.   Additional history obtained: Additional history obtained from family, EMS , and nursing home/care facility Records reviewed previous admission documents, Care Everywhere/External Records, and Primary Care Documents  Lab  Tests: I Ordered, and personally interpreted labs.  The pertinent results include: Labs and results were unremarkable.  No leukocytosis.  Electrolytes within normal limits.  Urinalysis was pending.  Imaging Studies ordered: I ordered imaging studies including CT scan head and X-ray chest   I independently visualized and interpreted imaging which showed these were both unremarkable.  No acute findings. I agree with the radiologist interpretation  EKG (personally reviewed and interpreted): No STEMI.  No arrhythmia.   Medical Decision Making: Patient presented with concerns for altered mental status.  He is unable for any history.  Family voiced concern that he was noticed responsive and interactive is normal.  Afebrile here.  Interactive and responsive for me.  Urinalysis was pending.  Suspect this could be worsening of his baseline dementia.  Do not see any metabolic or infectious findings that would explain the change in mental status.  Patient was out of the oncoming team awaiting urinalysis.  Patient will be reevaluated.  Please send over details regarding completion of work-up and final disposition.  Complexity of problems addressed: Patients presentation is most consistent with  acute presentation with potential threat to life or bodily function   Patient seen in conjunction with my attending, Dr. Alvino Chapel.  Clinical Impression/Dx: Final diagnoses:  Acute cystitis without hematuria     Rx/Dc Orders: ED Discharge Orders          Ordered    cephALEXin (KEFLEX) 500 MG capsule  3 times daily        10/15/21 1941                 Jacelyn Pi, MD 10/16/21 1419    Davonna Belling, MD 10/16/21 1529

## 2021-10-15 NOTE — Discharge Instructions (Addendum)
Brian Martin has a urinary tract infection He was given a dose of Rocephin (IV antibiotics) in the emergency department which should cover him over the next 24 hours We will send him home with a antibiotic pill which she should take 3 times a day for 7 days to treat this urinary tract infection.

## 2021-10-16 ENCOUNTER — Telehealth (HOSPITAL_BASED_OUTPATIENT_CLINIC_OR_DEPARTMENT_OTHER): Payer: Self-pay | Admitting: Emergency Medicine

## 2021-10-16 DIAGNOSIS — N390 Urinary tract infection, site not specified: Secondary | ICD-10-CM | POA: Diagnosis not present

## 2021-10-16 LAB — BLOOD CULTURE ID PANEL (REFLEXED) - BCID2

## 2021-10-17 DIAGNOSIS — L89892 Pressure ulcer of other site, stage 2: Secondary | ICD-10-CM | POA: Diagnosis not present

## 2021-10-17 DIAGNOSIS — E46 Unspecified protein-calorie malnutrition: Secondary | ICD-10-CM | POA: Diagnosis not present

## 2021-10-17 DIAGNOSIS — E782 Mixed hyperlipidemia: Secondary | ICD-10-CM | POA: Diagnosis not present

## 2021-10-17 DIAGNOSIS — E1122 Type 2 diabetes mellitus with diabetic chronic kidney disease: Secondary | ICD-10-CM | POA: Diagnosis not present

## 2021-10-17 DIAGNOSIS — I130 Hypertensive heart and chronic kidney disease with heart failure and stage 1 through stage 4 chronic kidney disease, or unspecified chronic kidney disease: Secondary | ICD-10-CM | POA: Diagnosis not present

## 2021-10-17 DIAGNOSIS — Z9181 History of falling: Secondary | ICD-10-CM | POA: Diagnosis not present

## 2021-10-17 DIAGNOSIS — Z794 Long term (current) use of insulin: Secondary | ICD-10-CM | POA: Diagnosis not present

## 2021-10-17 DIAGNOSIS — I5032 Chronic diastolic (congestive) heart failure: Secondary | ICD-10-CM | POA: Diagnosis not present

## 2021-10-17 DIAGNOSIS — I480 Paroxysmal atrial fibrillation: Secondary | ICD-10-CM | POA: Diagnosis not present

## 2021-10-17 DIAGNOSIS — N183 Chronic kidney disease, stage 3 unspecified: Secondary | ICD-10-CM | POA: Diagnosis not present

## 2021-10-17 DIAGNOSIS — E1165 Type 2 diabetes mellitus with hyperglycemia: Secondary | ICD-10-CM | POA: Diagnosis not present

## 2021-10-17 DIAGNOSIS — M6281 Muscle weakness (generalized): Secondary | ICD-10-CM | POA: Diagnosis not present

## 2021-10-17 DIAGNOSIS — Z48817 Encounter for surgical aftercare following surgery on the skin and subcutaneous tissue: Secondary | ICD-10-CM | POA: Diagnosis not present

## 2021-10-17 DIAGNOSIS — Z89512 Acquired absence of left leg below knee: Secondary | ICD-10-CM | POA: Diagnosis not present

## 2021-10-17 DIAGNOSIS — J45909 Unspecified asthma, uncomplicated: Secondary | ICD-10-CM | POA: Diagnosis not present

## 2021-10-17 DIAGNOSIS — K219 Gastro-esophageal reflux disease without esophagitis: Secondary | ICD-10-CM | POA: Diagnosis not present

## 2021-10-18 DIAGNOSIS — Z9181 History of falling: Secondary | ICD-10-CM | POA: Diagnosis not present

## 2021-10-18 DIAGNOSIS — K219 Gastro-esophageal reflux disease without esophagitis: Secondary | ICD-10-CM | POA: Diagnosis not present

## 2021-10-18 DIAGNOSIS — I130 Hypertensive heart and chronic kidney disease with heart failure and stage 1 through stage 4 chronic kidney disease, or unspecified chronic kidney disease: Secondary | ICD-10-CM | POA: Diagnosis not present

## 2021-10-18 DIAGNOSIS — Z794 Long term (current) use of insulin: Secondary | ICD-10-CM | POA: Diagnosis not present

## 2021-10-18 DIAGNOSIS — E782 Mixed hyperlipidemia: Secondary | ICD-10-CM | POA: Diagnosis not present

## 2021-10-18 DIAGNOSIS — I480 Paroxysmal atrial fibrillation: Secondary | ICD-10-CM | POA: Diagnosis not present

## 2021-10-18 DIAGNOSIS — E1122 Type 2 diabetes mellitus with diabetic chronic kidney disease: Secondary | ICD-10-CM | POA: Diagnosis not present

## 2021-10-18 DIAGNOSIS — Z48817 Encounter for surgical aftercare following surgery on the skin and subcutaneous tissue: Secondary | ICD-10-CM | POA: Diagnosis not present

## 2021-10-18 DIAGNOSIS — M6281 Muscle weakness (generalized): Secondary | ICD-10-CM | POA: Diagnosis not present

## 2021-10-18 DIAGNOSIS — L89892 Pressure ulcer of other site, stage 2: Secondary | ICD-10-CM | POA: Diagnosis not present

## 2021-10-18 DIAGNOSIS — I5032 Chronic diastolic (congestive) heart failure: Secondary | ICD-10-CM | POA: Diagnosis not present

## 2021-10-18 DIAGNOSIS — J45909 Unspecified asthma, uncomplicated: Secondary | ICD-10-CM | POA: Diagnosis not present

## 2021-10-18 DIAGNOSIS — N183 Chronic kidney disease, stage 3 unspecified: Secondary | ICD-10-CM | POA: Diagnosis not present

## 2021-10-18 DIAGNOSIS — E1165 Type 2 diabetes mellitus with hyperglycemia: Secondary | ICD-10-CM | POA: Diagnosis not present

## 2021-10-18 DIAGNOSIS — E46 Unspecified protein-calorie malnutrition: Secondary | ICD-10-CM | POA: Diagnosis not present

## 2021-10-18 DIAGNOSIS — Z89512 Acquired absence of left leg below knee: Secondary | ICD-10-CM | POA: Diagnosis not present

## 2021-10-18 LAB — CULTURE, BLOOD (ROUTINE X 2): Special Requests: ADEQUATE

## 2021-10-18 LAB — URINE CULTURE: Culture: >=100000 — AB

## 2021-10-19 ENCOUNTER — Telehealth: Payer: Self-pay | Admitting: Emergency Medicine

## 2021-10-19 DIAGNOSIS — Z48817 Encounter for surgical aftercare following surgery on the skin and subcutaneous tissue: Secondary | ICD-10-CM | POA: Diagnosis not present

## 2021-10-19 DIAGNOSIS — K219 Gastro-esophageal reflux disease without esophagitis: Secondary | ICD-10-CM | POA: Diagnosis not present

## 2021-10-19 DIAGNOSIS — I480 Paroxysmal atrial fibrillation: Secondary | ICD-10-CM | POA: Diagnosis not present

## 2021-10-19 DIAGNOSIS — L89892 Pressure ulcer of other site, stage 2: Secondary | ICD-10-CM | POA: Diagnosis not present

## 2021-10-19 DIAGNOSIS — E1122 Type 2 diabetes mellitus with diabetic chronic kidney disease: Secondary | ICD-10-CM | POA: Diagnosis not present

## 2021-10-19 DIAGNOSIS — J45909 Unspecified asthma, uncomplicated: Secondary | ICD-10-CM | POA: Diagnosis not present

## 2021-10-19 DIAGNOSIS — Z794 Long term (current) use of insulin: Secondary | ICD-10-CM | POA: Diagnosis not present

## 2021-10-19 DIAGNOSIS — I130 Hypertensive heart and chronic kidney disease with heart failure and stage 1 through stage 4 chronic kidney disease, or unspecified chronic kidney disease: Secondary | ICD-10-CM | POA: Diagnosis not present

## 2021-10-19 DIAGNOSIS — E782 Mixed hyperlipidemia: Secondary | ICD-10-CM | POA: Diagnosis not present

## 2021-10-19 DIAGNOSIS — Z9181 History of falling: Secondary | ICD-10-CM | POA: Diagnosis not present

## 2021-10-19 DIAGNOSIS — M6281 Muscle weakness (generalized): Secondary | ICD-10-CM | POA: Diagnosis not present

## 2021-10-19 DIAGNOSIS — E1165 Type 2 diabetes mellitus with hyperglycemia: Secondary | ICD-10-CM | POA: Diagnosis not present

## 2021-10-19 DIAGNOSIS — E46 Unspecified protein-calorie malnutrition: Secondary | ICD-10-CM | POA: Diagnosis not present

## 2021-10-19 DIAGNOSIS — Z89512 Acquired absence of left leg below knee: Secondary | ICD-10-CM | POA: Diagnosis not present

## 2021-10-19 DIAGNOSIS — N183 Chronic kidney disease, stage 3 unspecified: Secondary | ICD-10-CM | POA: Diagnosis not present

## 2021-10-19 DIAGNOSIS — I5032 Chronic diastolic (congestive) heart failure: Secondary | ICD-10-CM | POA: Diagnosis not present

## 2021-10-19 NOTE — Telephone Encounter (Signed)
Post ED Visit - Positive Culture Follow-up  Culture report reviewed by antimicrobial stewardship pharmacist: [place] Team []  Nathan Batchelder, Pharm.D. []  [address], Pharm.D., BCPS AQ-ID []  Heide Guile, Pharm.D., BCPS []  Parks Neptune, Pharm.D., BCPS []  [place], [place].D., BCPS, AAHIVP []  [place], Pharm.D., BCPS, AAHIVP []  Legrand Como, PharmD, BCPS []  Salome Arnt, PharmD, BCPS []  Johnnette Gourd, PharmD, BCPS []  Hughes Better, PharmD []  Leeroy Cha, PharmD, BCPS []  Laqueta Linden, PharmD  [place] Team []  [address], PharmD []  Leodis Sias, PharmD []  Lindell Spar, PharmD []  Royetta Asal, Rph []  Graylin Shiver) Rema Fendt, PharmD []  Glennon Mac, PharmD []  Arlyn Dunning, PharmD []  Netta Cedars, PharmD []  Dia Sitter, PharmD []  Leone Haven, PharmD []  Gretta Arab, PharmD []  Theodis Shove, PharmD []  Peggyann Juba, PharmD   Positive urine culture Treated with cephalexin, organism sensitive to the same and no further patient follow-up is required at this time.  Reuel Boom 10/19/2021, 10:09 AM

## 2021-10-20 DIAGNOSIS — N183 Chronic kidney disease, stage 3 unspecified: Secondary | ICD-10-CM | POA: Diagnosis not present

## 2021-10-20 DIAGNOSIS — E46 Unspecified protein-calorie malnutrition: Secondary | ICD-10-CM | POA: Diagnosis not present

## 2021-10-20 DIAGNOSIS — J45909 Unspecified asthma, uncomplicated: Secondary | ICD-10-CM | POA: Diagnosis not present

## 2021-10-20 DIAGNOSIS — Z89512 Acquired absence of left leg below knee: Secondary | ICD-10-CM | POA: Diagnosis not present

## 2021-10-20 DIAGNOSIS — I5032 Chronic diastolic (congestive) heart failure: Secondary | ICD-10-CM | POA: Diagnosis not present

## 2021-10-20 DIAGNOSIS — Z794 Long term (current) use of insulin: Secondary | ICD-10-CM | POA: Diagnosis not present

## 2021-10-20 DIAGNOSIS — L89892 Pressure ulcer of other site, stage 2: Secondary | ICD-10-CM | POA: Diagnosis not present

## 2021-10-20 DIAGNOSIS — E1165 Type 2 diabetes mellitus with hyperglycemia: Secondary | ICD-10-CM | POA: Diagnosis not present

## 2021-10-20 DIAGNOSIS — E782 Mixed hyperlipidemia: Secondary | ICD-10-CM | POA: Diagnosis not present

## 2021-10-20 DIAGNOSIS — I130 Hypertensive heart and chronic kidney disease with heart failure and stage 1 through stage 4 chronic kidney disease, or unspecified chronic kidney disease: Secondary | ICD-10-CM | POA: Diagnosis not present

## 2021-10-20 DIAGNOSIS — K219 Gastro-esophageal reflux disease without esophagitis: Secondary | ICD-10-CM | POA: Diagnosis not present

## 2021-10-20 DIAGNOSIS — Z9181 History of falling: Secondary | ICD-10-CM | POA: Diagnosis not present

## 2021-10-20 DIAGNOSIS — E1122 Type 2 diabetes mellitus with diabetic chronic kidney disease: Secondary | ICD-10-CM | POA: Diagnosis not present

## 2021-10-20 DIAGNOSIS — I480 Paroxysmal atrial fibrillation: Secondary | ICD-10-CM | POA: Diagnosis not present

## 2021-10-20 DIAGNOSIS — M6281 Muscle weakness (generalized): Secondary | ICD-10-CM | POA: Diagnosis not present

## 2021-10-20 DIAGNOSIS — Z48817 Encounter for surgical aftercare following surgery on the skin and subcutaneous tissue: Secondary | ICD-10-CM | POA: Diagnosis not present

## 2021-10-20 LAB — CULTURE, BLOOD (ROUTINE X 2)
Culture: NO GROWTH
Special Requests: ADEQUATE

## 2021-10-21 DIAGNOSIS — Z89512 Acquired absence of left leg below knee: Secondary | ICD-10-CM | POA: Diagnosis not present

## 2021-10-21 DIAGNOSIS — L89892 Pressure ulcer of other site, stage 2: Secondary | ICD-10-CM | POA: Diagnosis not present

## 2021-10-21 DIAGNOSIS — E46 Unspecified protein-calorie malnutrition: Secondary | ICD-10-CM | POA: Diagnosis not present

## 2021-10-21 DIAGNOSIS — M6281 Muscle weakness (generalized): Secondary | ICD-10-CM | POA: Diagnosis not present

## 2021-10-21 DIAGNOSIS — I5032 Chronic diastolic (congestive) heart failure: Secondary | ICD-10-CM | POA: Diagnosis not present

## 2021-10-21 DIAGNOSIS — I130 Hypertensive heart and chronic kidney disease with heart failure and stage 1 through stage 4 chronic kidney disease, or unspecified chronic kidney disease: Secondary | ICD-10-CM | POA: Diagnosis not present

## 2021-10-21 DIAGNOSIS — I480 Paroxysmal atrial fibrillation: Secondary | ICD-10-CM | POA: Diagnosis not present

## 2021-10-21 DIAGNOSIS — E1122 Type 2 diabetes mellitus with diabetic chronic kidney disease: Secondary | ICD-10-CM | POA: Diagnosis not present

## 2021-10-21 DIAGNOSIS — N183 Chronic kidney disease, stage 3 unspecified: Secondary | ICD-10-CM | POA: Diagnosis not present

## 2021-10-21 DIAGNOSIS — J45909 Unspecified asthma, uncomplicated: Secondary | ICD-10-CM | POA: Diagnosis not present

## 2021-10-21 DIAGNOSIS — K219 Gastro-esophageal reflux disease without esophagitis: Secondary | ICD-10-CM | POA: Diagnosis not present

## 2021-10-21 DIAGNOSIS — E1165 Type 2 diabetes mellitus with hyperglycemia: Secondary | ICD-10-CM | POA: Diagnosis not present

## 2021-10-21 DIAGNOSIS — Z9181 History of falling: Secondary | ICD-10-CM | POA: Diagnosis not present

## 2021-10-21 DIAGNOSIS — Z794 Long term (current) use of insulin: Secondary | ICD-10-CM | POA: Diagnosis not present

## 2021-10-21 DIAGNOSIS — E782 Mixed hyperlipidemia: Secondary | ICD-10-CM | POA: Diagnosis not present

## 2021-10-21 DIAGNOSIS — Z48817 Encounter for surgical aftercare following surgery on the skin and subcutaneous tissue: Secondary | ICD-10-CM | POA: Diagnosis not present

## 2021-10-22 DIAGNOSIS — Z89512 Acquired absence of left leg below knee: Secondary | ICD-10-CM | POA: Diagnosis not present

## 2021-10-22 DIAGNOSIS — E1122 Type 2 diabetes mellitus with diabetic chronic kidney disease: Secondary | ICD-10-CM | POA: Diagnosis not present

## 2021-10-22 DIAGNOSIS — Z9181 History of falling: Secondary | ICD-10-CM | POA: Diagnosis not present

## 2021-10-22 DIAGNOSIS — I480 Paroxysmal atrial fibrillation: Secondary | ICD-10-CM | POA: Diagnosis not present

## 2021-10-22 DIAGNOSIS — Z794 Long term (current) use of insulin: Secondary | ICD-10-CM | POA: Diagnosis not present

## 2021-10-22 DIAGNOSIS — L89892 Pressure ulcer of other site, stage 2: Secondary | ICD-10-CM | POA: Diagnosis not present

## 2021-10-22 DIAGNOSIS — J45909 Unspecified asthma, uncomplicated: Secondary | ICD-10-CM | POA: Diagnosis not present

## 2021-10-22 DIAGNOSIS — M6281 Muscle weakness (generalized): Secondary | ICD-10-CM | POA: Diagnosis not present

## 2021-10-22 DIAGNOSIS — K219 Gastro-esophageal reflux disease without esophagitis: Secondary | ICD-10-CM | POA: Diagnosis not present

## 2021-10-22 DIAGNOSIS — E782 Mixed hyperlipidemia: Secondary | ICD-10-CM | POA: Diagnosis not present

## 2021-10-22 DIAGNOSIS — I130 Hypertensive heart and chronic kidney disease with heart failure and stage 1 through stage 4 chronic kidney disease, or unspecified chronic kidney disease: Secondary | ICD-10-CM | POA: Diagnosis not present

## 2021-10-22 DIAGNOSIS — E1165 Type 2 diabetes mellitus with hyperglycemia: Secondary | ICD-10-CM | POA: Diagnosis not present

## 2021-10-22 DIAGNOSIS — E46 Unspecified protein-calorie malnutrition: Secondary | ICD-10-CM | POA: Diagnosis not present

## 2021-10-22 DIAGNOSIS — N183 Chronic kidney disease, stage 3 unspecified: Secondary | ICD-10-CM | POA: Diagnosis not present

## 2021-10-22 DIAGNOSIS — I5032 Chronic diastolic (congestive) heart failure: Secondary | ICD-10-CM | POA: Diagnosis not present

## 2021-10-22 DIAGNOSIS — Z48817 Encounter for surgical aftercare following surgery on the skin and subcutaneous tissue: Secondary | ICD-10-CM | POA: Diagnosis not present

## 2021-10-24 DIAGNOSIS — R404 Transient alteration of awareness: Secondary | ICD-10-CM | POA: Diagnosis not present

## 2021-10-24 DIAGNOSIS — N183 Chronic kidney disease, stage 3 unspecified: Secondary | ICD-10-CM | POA: Diagnosis not present

## 2021-10-24 DIAGNOSIS — K219 Gastro-esophageal reflux disease without esophagitis: Secondary | ICD-10-CM | POA: Diagnosis not present

## 2021-10-24 DIAGNOSIS — Z79899 Other long term (current) drug therapy: Secondary | ICD-10-CM | POA: Diagnosis not present

## 2021-10-24 DIAGNOSIS — Z48817 Encounter for surgical aftercare following surgery on the skin and subcutaneous tissue: Secondary | ICD-10-CM | POA: Diagnosis not present

## 2021-10-24 DIAGNOSIS — Z794 Long term (current) use of insulin: Secondary | ICD-10-CM | POA: Diagnosis not present

## 2021-10-24 DIAGNOSIS — Z9181 History of falling: Secondary | ICD-10-CM | POA: Diagnosis not present

## 2021-10-24 DIAGNOSIS — E1165 Type 2 diabetes mellitus with hyperglycemia: Secondary | ICD-10-CM | POA: Diagnosis not present

## 2021-10-24 DIAGNOSIS — E1122 Type 2 diabetes mellitus with diabetic chronic kidney disease: Secondary | ICD-10-CM | POA: Diagnosis not present

## 2021-10-24 DIAGNOSIS — G319 Degenerative disease of nervous system, unspecified: Secondary | ICD-10-CM | POA: Diagnosis not present

## 2021-10-24 DIAGNOSIS — R410 Disorientation, unspecified: Secondary | ICD-10-CM | POA: Diagnosis not present

## 2021-10-24 DIAGNOSIS — I480 Paroxysmal atrial fibrillation: Secondary | ICD-10-CM | POA: Diagnosis not present

## 2021-10-24 DIAGNOSIS — N390 Urinary tract infection, site not specified: Secondary | ICD-10-CM | POA: Diagnosis not present

## 2021-10-24 DIAGNOSIS — E782 Mixed hyperlipidemia: Secondary | ICD-10-CM | POA: Diagnosis not present

## 2021-10-24 DIAGNOSIS — I10 Essential (primary) hypertension: Secondary | ICD-10-CM | POA: Diagnosis not present

## 2021-10-24 DIAGNOSIS — Z89512 Acquired absence of left leg below knee: Secondary | ICD-10-CM | POA: Diagnosis not present

## 2021-10-24 DIAGNOSIS — I5032 Chronic diastolic (congestive) heart failure: Secondary | ICD-10-CM | POA: Diagnosis not present

## 2021-10-24 DIAGNOSIS — L89892 Pressure ulcer of other site, stage 2: Secondary | ICD-10-CM | POA: Diagnosis not present

## 2021-10-24 DIAGNOSIS — M6281 Muscle weakness (generalized): Secondary | ICD-10-CM | POA: Diagnosis not present

## 2021-10-24 DIAGNOSIS — E46 Unspecified protein-calorie malnutrition: Secondary | ICD-10-CM | POA: Diagnosis not present

## 2021-10-24 DIAGNOSIS — Z743 Need for continuous supervision: Secondary | ICD-10-CM | POA: Diagnosis not present

## 2021-10-24 DIAGNOSIS — J45909 Unspecified asthma, uncomplicated: Secondary | ICD-10-CM | POA: Diagnosis not present

## 2021-10-24 DIAGNOSIS — I130 Hypertensive heart and chronic kidney disease with heart failure and stage 1 through stage 4 chronic kidney disease, or unspecified chronic kidney disease: Secondary | ICD-10-CM | POA: Diagnosis not present

## 2021-10-24 DIAGNOSIS — R6889 Other general symptoms and signs: Secondary | ICD-10-CM | POA: Diagnosis not present

## 2021-10-24 DIAGNOSIS — I6782 Cerebral ischemia: Secondary | ICD-10-CM | POA: Diagnosis not present

## 2021-10-25 DIAGNOSIS — S91011D Laceration without foreign body, right ankle, subsequent encounter: Secondary | ICD-10-CM | POA: Diagnosis not present

## 2021-10-25 DIAGNOSIS — R404 Transient alteration of awareness: Secondary | ICD-10-CM | POA: Diagnosis not present

## 2021-10-25 DIAGNOSIS — Z7401 Bed confinement status: Secondary | ICD-10-CM | POA: Diagnosis not present

## 2021-10-26 DIAGNOSIS — Z89512 Acquired absence of left leg below knee: Secondary | ICD-10-CM | POA: Diagnosis not present

## 2021-10-26 DIAGNOSIS — E785 Hyperlipidemia, unspecified: Secondary | ICD-10-CM | POA: Diagnosis not present

## 2021-10-26 DIAGNOSIS — I5032 Chronic diastolic (congestive) heart failure: Secondary | ICD-10-CM | POA: Diagnosis not present

## 2021-10-26 DIAGNOSIS — M6281 Muscle weakness (generalized): Secondary | ICD-10-CM | POA: Diagnosis not present

## 2021-10-26 DIAGNOSIS — Z48817 Encounter for surgical aftercare following surgery on the skin and subcutaneous tissue: Secondary | ICD-10-CM | POA: Diagnosis not present

## 2021-10-26 DIAGNOSIS — J45909 Unspecified asthma, uncomplicated: Secondary | ICD-10-CM | POA: Diagnosis not present

## 2021-10-26 DIAGNOSIS — N183 Chronic kidney disease, stage 3 unspecified: Secondary | ICD-10-CM | POA: Diagnosis not present

## 2021-10-26 DIAGNOSIS — Z794 Long term (current) use of insulin: Secondary | ICD-10-CM | POA: Diagnosis not present

## 2021-10-26 DIAGNOSIS — I130 Hypertensive heart and chronic kidney disease with heart failure and stage 1 through stage 4 chronic kidney disease, or unspecified chronic kidney disease: Secondary | ICD-10-CM | POA: Diagnosis not present

## 2021-10-26 DIAGNOSIS — I480 Paroxysmal atrial fibrillation: Secondary | ICD-10-CM | POA: Diagnosis not present

## 2021-10-26 DIAGNOSIS — L89892 Pressure ulcer of other site, stage 2: Secondary | ICD-10-CM | POA: Diagnosis not present

## 2021-10-26 DIAGNOSIS — E1122 Type 2 diabetes mellitus with diabetic chronic kidney disease: Secondary | ICD-10-CM | POA: Diagnosis not present

## 2021-10-26 DIAGNOSIS — Z9181 History of falling: Secondary | ICD-10-CM | POA: Diagnosis not present

## 2021-10-26 DIAGNOSIS — E782 Mixed hyperlipidemia: Secondary | ICD-10-CM | POA: Diagnosis not present

## 2021-10-26 DIAGNOSIS — E46 Unspecified protein-calorie malnutrition: Secondary | ICD-10-CM | POA: Diagnosis not present

## 2021-10-26 DIAGNOSIS — K219 Gastro-esophageal reflux disease without esophagitis: Secondary | ICD-10-CM | POA: Diagnosis not present

## 2021-10-26 DIAGNOSIS — E1165 Type 2 diabetes mellitus with hyperglycemia: Secondary | ICD-10-CM | POA: Diagnosis not present

## 2021-10-29 DIAGNOSIS — Z743 Need for continuous supervision: Secondary | ICD-10-CM | POA: Diagnosis not present

## 2021-10-29 DIAGNOSIS — Z9889 Other specified postprocedural states: Secondary | ICD-10-CM | POA: Diagnosis not present

## 2021-10-29 DIAGNOSIS — R739 Hyperglycemia, unspecified: Secondary | ICD-10-CM | POA: Diagnosis not present

## 2021-10-29 DIAGNOSIS — R6889 Other general symptoms and signs: Secondary | ICD-10-CM | POA: Diagnosis not present

## 2021-10-29 DIAGNOSIS — R296 Repeated falls: Secondary | ICD-10-CM | POA: Diagnosis not present

## 2021-10-29 DIAGNOSIS — F03911 Unspecified dementia, unspecified severity, with agitation: Secondary | ICD-10-CM | POA: Diagnosis not present

## 2021-10-29 DIAGNOSIS — Z89512 Acquired absence of left leg below knee: Secondary | ICD-10-CM | POA: Diagnosis not present

## 2021-10-30 DIAGNOSIS — R404 Transient alteration of awareness: Secondary | ICD-10-CM | POA: Diagnosis not present

## 2021-10-30 DIAGNOSIS — F03918 Unspecified dementia, unspecified severity, with other behavioral disturbance: Secondary | ICD-10-CM | POA: Diagnosis not present

## 2021-10-30 DIAGNOSIS — Z743 Need for continuous supervision: Secondary | ICD-10-CM | POA: Diagnosis not present

## 2021-11-01 DIAGNOSIS — I5032 Chronic diastolic (congestive) heart failure: Secondary | ICD-10-CM | POA: Diagnosis not present

## 2021-11-01 DIAGNOSIS — Z9714 Presence of artificial left leg (complete) (partial): Secondary | ICD-10-CM | POA: Diagnosis not present

## 2021-11-01 DIAGNOSIS — E782 Mixed hyperlipidemia: Secondary | ICD-10-CM | POA: Diagnosis not present

## 2021-11-01 DIAGNOSIS — I480 Paroxysmal atrial fibrillation: Secondary | ICD-10-CM | POA: Diagnosis not present

## 2021-11-01 DIAGNOSIS — I4891 Unspecified atrial fibrillation: Secondary | ICD-10-CM | POA: Diagnosis not present

## 2021-11-01 DIAGNOSIS — R451 Restlessness and agitation: Secondary | ICD-10-CM | POA: Diagnosis not present

## 2021-11-01 DIAGNOSIS — I130 Hypertensive heart and chronic kidney disease with heart failure and stage 1 through stage 4 chronic kidney disease, or unspecified chronic kidney disease: Secondary | ICD-10-CM | POA: Diagnosis not present

## 2021-11-01 DIAGNOSIS — I509 Heart failure, unspecified: Secondary | ICD-10-CM | POA: Diagnosis not present

## 2021-11-01 DIAGNOSIS — E46 Unspecified protein-calorie malnutrition: Secondary | ICD-10-CM | POA: Diagnosis not present

## 2021-11-01 DIAGNOSIS — F03C11 Unspecified dementia, severe, with agitation: Secondary | ICD-10-CM | POA: Diagnosis not present

## 2021-11-01 DIAGNOSIS — L89892 Pressure ulcer of other site, stage 2: Secondary | ICD-10-CM | POA: Diagnosis not present

## 2021-11-01 DIAGNOSIS — K219 Gastro-esophageal reflux disease without esophagitis: Secondary | ICD-10-CM | POA: Diagnosis not present

## 2021-11-01 DIAGNOSIS — I6782 Cerebral ischemia: Secondary | ICD-10-CM | POA: Diagnosis not present

## 2021-11-01 DIAGNOSIS — Z20822 Contact with and (suspected) exposure to covid-19: Secondary | ICD-10-CM | POA: Diagnosis not present

## 2021-11-01 DIAGNOSIS — I10 Essential (primary) hypertension: Secondary | ICD-10-CM | POA: Diagnosis not present

## 2021-11-01 DIAGNOSIS — Z79899 Other long term (current) drug therapy: Secondary | ICD-10-CM | POA: Diagnosis not present

## 2021-11-01 DIAGNOSIS — R8271 Bacteriuria: Secondary | ICD-10-CM | POA: Diagnosis not present

## 2021-11-01 DIAGNOSIS — N183 Chronic kidney disease, stage 3 unspecified: Secondary | ICD-10-CM | POA: Diagnosis not present

## 2021-11-01 DIAGNOSIS — R9082 White matter disease, unspecified: Secondary | ICD-10-CM | POA: Diagnosis not present

## 2021-11-01 DIAGNOSIS — Z87891 Personal history of nicotine dependence: Secondary | ICD-10-CM | POA: Diagnosis not present

## 2021-11-01 DIAGNOSIS — Z7409 Other reduced mobility: Secondary | ICD-10-CM | POA: Diagnosis not present

## 2021-11-01 DIAGNOSIS — Z7982 Long term (current) use of aspirin: Secondary | ICD-10-CM | POA: Diagnosis not present

## 2021-11-01 DIAGNOSIS — R531 Weakness: Secondary | ICD-10-CM | POA: Diagnosis not present

## 2021-11-01 DIAGNOSIS — Z48817 Encounter for surgical aftercare following surgery on the skin and subcutaneous tissue: Secondary | ICD-10-CM | POA: Diagnosis not present

## 2021-11-01 DIAGNOSIS — E1122 Type 2 diabetes mellitus with diabetic chronic kidney disease: Secondary | ICD-10-CM | POA: Diagnosis not present

## 2021-11-01 DIAGNOSIS — J45909 Unspecified asthma, uncomplicated: Secondary | ICD-10-CM | POA: Diagnosis not present

## 2021-11-01 DIAGNOSIS — Z794 Long term (current) use of insulin: Secondary | ICD-10-CM | POA: Diagnosis not present

## 2021-11-01 DIAGNOSIS — E1165 Type 2 diabetes mellitus with hyperglycemia: Secondary | ICD-10-CM | POA: Diagnosis not present

## 2021-11-01 DIAGNOSIS — Z89512 Acquired absence of left leg below knee: Secondary | ICD-10-CM | POA: Diagnosis not present

## 2021-11-01 DIAGNOSIS — M6281 Muscle weakness (generalized): Secondary | ICD-10-CM | POA: Diagnosis not present

## 2021-11-01 DIAGNOSIS — R296 Repeated falls: Secondary | ICD-10-CM | POA: Diagnosis not present

## 2021-11-01 DIAGNOSIS — Z9181 History of falling: Secondary | ICD-10-CM | POA: Diagnosis not present

## 2021-11-02 DIAGNOSIS — E038 Other specified hypothyroidism: Secondary | ICD-10-CM | POA: Diagnosis not present

## 2021-11-02 DIAGNOSIS — E559 Vitamin D deficiency, unspecified: Secondary | ICD-10-CM | POA: Diagnosis not present

## 2021-11-02 DIAGNOSIS — I509 Heart failure, unspecified: Secondary | ICD-10-CM | POA: Diagnosis not present

## 2021-11-02 DIAGNOSIS — Z87891 Personal history of nicotine dependence: Secondary | ICD-10-CM | POA: Diagnosis not present

## 2021-11-02 DIAGNOSIS — I480 Paroxysmal atrial fibrillation: Secondary | ICD-10-CM | POA: Diagnosis not present

## 2021-11-02 DIAGNOSIS — I10 Essential (primary) hypertension: Secondary | ICD-10-CM | POA: Diagnosis not present

## 2021-11-02 DIAGNOSIS — Z7982 Long term (current) use of aspirin: Secondary | ICD-10-CM | POA: Diagnosis not present

## 2021-11-02 DIAGNOSIS — Z79899 Other long term (current) drug therapy: Secondary | ICD-10-CM | POA: Diagnosis not present

## 2021-11-02 DIAGNOSIS — E1165 Type 2 diabetes mellitus with hyperglycemia: Secondary | ICD-10-CM | POA: Diagnosis not present

## 2021-11-02 DIAGNOSIS — R531 Weakness: Secondary | ICD-10-CM | POA: Diagnosis not present

## 2021-11-02 DIAGNOSIS — E119 Type 2 diabetes mellitus without complications: Secondary | ICD-10-CM | POA: Diagnosis not present

## 2021-11-02 DIAGNOSIS — D518 Other vitamin B12 deficiency anemias: Secondary | ICD-10-CM | POA: Diagnosis not present

## 2021-11-02 DIAGNOSIS — Z7409 Other reduced mobility: Secondary | ICD-10-CM | POA: Diagnosis not present

## 2021-11-02 DIAGNOSIS — N183 Chronic kidney disease, stage 3 unspecified: Secondary | ICD-10-CM | POA: Diagnosis not present

## 2021-11-02 DIAGNOSIS — Z9181 History of falling: Secondary | ICD-10-CM | POA: Diagnosis not present

## 2021-11-02 DIAGNOSIS — I5032 Chronic diastolic (congestive) heart failure: Secondary | ICD-10-CM | POA: Diagnosis not present

## 2021-11-02 DIAGNOSIS — Z20822 Contact with and (suspected) exposure to covid-19: Secondary | ICD-10-CM | POA: Diagnosis not present

## 2021-11-02 DIAGNOSIS — I4891 Unspecified atrial fibrillation: Secondary | ICD-10-CM | POA: Diagnosis not present

## 2021-11-02 DIAGNOSIS — Z9714 Presence of artificial left leg (complete) (partial): Secondary | ICD-10-CM | POA: Diagnosis not present

## 2021-11-02 DIAGNOSIS — R451 Restlessness and agitation: Secondary | ICD-10-CM | POA: Diagnosis not present

## 2021-11-02 DIAGNOSIS — I130 Hypertensive heart and chronic kidney disease with heart failure and stage 1 through stage 4 chronic kidney disease, or unspecified chronic kidney disease: Secondary | ICD-10-CM | POA: Diagnosis not present

## 2021-11-02 DIAGNOSIS — J45909 Unspecified asthma, uncomplicated: Secondary | ICD-10-CM | POA: Diagnosis not present

## 2021-11-02 DIAGNOSIS — E785 Hyperlipidemia, unspecified: Secondary | ICD-10-CM | POA: Diagnosis not present

## 2021-11-03 DIAGNOSIS — I509 Heart failure, unspecified: Secondary | ICD-10-CM | POA: Diagnosis not present

## 2021-11-03 DIAGNOSIS — R531 Weakness: Secondary | ICD-10-CM | POA: Diagnosis not present

## 2021-11-03 DIAGNOSIS — Z9714 Presence of artificial left leg (complete) (partial): Secondary | ICD-10-CM | POA: Diagnosis not present

## 2021-11-03 DIAGNOSIS — Z9181 History of falling: Secondary | ICD-10-CM | POA: Diagnosis not present

## 2021-11-03 DIAGNOSIS — R451 Restlessness and agitation: Secondary | ICD-10-CM | POA: Diagnosis not present

## 2021-11-03 DIAGNOSIS — Z20822 Contact with and (suspected) exposure to covid-19: Secondary | ICD-10-CM | POA: Diagnosis not present

## 2021-11-03 DIAGNOSIS — I10 Essential (primary) hypertension: Secondary | ICD-10-CM | POA: Diagnosis not present

## 2021-11-03 DIAGNOSIS — I130 Hypertensive heart and chronic kidney disease with heart failure and stage 1 through stage 4 chronic kidney disease, or unspecified chronic kidney disease: Secondary | ICD-10-CM | POA: Diagnosis not present

## 2021-11-03 DIAGNOSIS — Z7982 Long term (current) use of aspirin: Secondary | ICD-10-CM | POA: Diagnosis not present

## 2021-11-03 DIAGNOSIS — Z87891 Personal history of nicotine dependence: Secondary | ICD-10-CM | POA: Diagnosis not present

## 2021-11-03 DIAGNOSIS — Z79899 Other long term (current) drug therapy: Secondary | ICD-10-CM | POA: Diagnosis not present

## 2021-11-03 DIAGNOSIS — Z7409 Other reduced mobility: Secondary | ICD-10-CM | POA: Diagnosis not present

## 2021-11-03 DIAGNOSIS — I4891 Unspecified atrial fibrillation: Secondary | ICD-10-CM | POA: Diagnosis not present

## 2021-11-03 DIAGNOSIS — N183 Chronic kidney disease, stage 3 unspecified: Secondary | ICD-10-CM | POA: Diagnosis not present

## 2021-11-07 DIAGNOSIS — R531 Weakness: Secondary | ICD-10-CM | POA: Diagnosis not present

## 2021-11-07 DIAGNOSIS — N183 Chronic kidney disease, stage 3 unspecified: Secondary | ICD-10-CM | POA: Diagnosis not present

## 2021-11-07 DIAGNOSIS — Z7409 Other reduced mobility: Secondary | ICD-10-CM | POA: Diagnosis not present

## 2021-11-07 DIAGNOSIS — R296 Repeated falls: Secondary | ICD-10-CM | POA: Diagnosis not present

## 2021-11-07 DIAGNOSIS — Z87891 Personal history of nicotine dependence: Secondary | ICD-10-CM | POA: Diagnosis not present

## 2021-11-07 DIAGNOSIS — Z20822 Contact with and (suspected) exposure to covid-19: Secondary | ICD-10-CM | POA: Diagnosis not present

## 2021-11-07 DIAGNOSIS — I4891 Unspecified atrial fibrillation: Secondary | ICD-10-CM | POA: Diagnosis not present

## 2021-11-07 DIAGNOSIS — I509 Heart failure, unspecified: Secondary | ICD-10-CM | POA: Diagnosis not present

## 2021-11-07 DIAGNOSIS — R451 Restlessness and agitation: Secondary | ICD-10-CM | POA: Diagnosis not present

## 2021-11-07 DIAGNOSIS — Z79899 Other long term (current) drug therapy: Secondary | ICD-10-CM | POA: Diagnosis not present

## 2021-11-07 DIAGNOSIS — Z7982 Long term (current) use of aspirin: Secondary | ICD-10-CM | POA: Diagnosis not present

## 2021-11-07 DIAGNOSIS — Z9714 Presence of artificial left leg (complete) (partial): Secondary | ICD-10-CM | POA: Diagnosis not present

## 2021-11-07 DIAGNOSIS — Z9181 History of falling: Secondary | ICD-10-CM | POA: Diagnosis not present

## 2021-11-07 DIAGNOSIS — I130 Hypertensive heart and chronic kidney disease with heart failure and stage 1 through stage 4 chronic kidney disease, or unspecified chronic kidney disease: Secondary | ICD-10-CM | POA: Diagnosis not present

## 2021-11-08 DIAGNOSIS — Z9714 Presence of artificial left leg (complete) (partial): Secondary | ICD-10-CM | POA: Diagnosis not present

## 2021-11-08 DIAGNOSIS — Z7409 Other reduced mobility: Secondary | ICD-10-CM | POA: Diagnosis not present

## 2021-11-08 DIAGNOSIS — Z79899 Other long term (current) drug therapy: Secondary | ICD-10-CM | POA: Diagnosis not present

## 2021-11-08 DIAGNOSIS — Z20822 Contact with and (suspected) exposure to covid-19: Secondary | ICD-10-CM | POA: Diagnosis not present

## 2021-11-08 DIAGNOSIS — R531 Weakness: Secondary | ICD-10-CM | POA: Diagnosis not present

## 2021-11-08 DIAGNOSIS — I130 Hypertensive heart and chronic kidney disease with heart failure and stage 1 through stage 4 chronic kidney disease, or unspecified chronic kidney disease: Secondary | ICD-10-CM | POA: Diagnosis not present

## 2021-11-08 DIAGNOSIS — Z9181 History of falling: Secondary | ICD-10-CM | POA: Diagnosis not present

## 2021-11-08 DIAGNOSIS — I509 Heart failure, unspecified: Secondary | ICD-10-CM | POA: Diagnosis not present

## 2021-11-08 DIAGNOSIS — N183 Chronic kidney disease, stage 3 unspecified: Secondary | ICD-10-CM | POA: Diagnosis not present

## 2021-11-08 DIAGNOSIS — Z87891 Personal history of nicotine dependence: Secondary | ICD-10-CM | POA: Diagnosis not present

## 2021-11-08 DIAGNOSIS — R451 Restlessness and agitation: Secondary | ICD-10-CM | POA: Diagnosis not present

## 2021-11-08 DIAGNOSIS — Z7982 Long term (current) use of aspirin: Secondary | ICD-10-CM | POA: Diagnosis not present

## 2021-11-08 DIAGNOSIS — I4891 Unspecified atrial fibrillation: Secondary | ICD-10-CM | POA: Diagnosis not present

## 2021-11-09 DIAGNOSIS — Z87891 Personal history of nicotine dependence: Secondary | ICD-10-CM | POA: Diagnosis not present

## 2021-11-09 DIAGNOSIS — I130 Hypertensive heart and chronic kidney disease with heart failure and stage 1 through stage 4 chronic kidney disease, or unspecified chronic kidney disease: Secondary | ICD-10-CM | POA: Diagnosis not present

## 2021-11-09 DIAGNOSIS — Z7982 Long term (current) use of aspirin: Secondary | ICD-10-CM | POA: Diagnosis not present

## 2021-11-09 DIAGNOSIS — I4891 Unspecified atrial fibrillation: Secondary | ICD-10-CM | POA: Diagnosis not present

## 2021-11-09 DIAGNOSIS — Z7409 Other reduced mobility: Secondary | ICD-10-CM | POA: Diagnosis not present

## 2021-11-09 DIAGNOSIS — I509 Heart failure, unspecified: Secondary | ICD-10-CM | POA: Diagnosis not present

## 2021-11-09 DIAGNOSIS — Z79899 Other long term (current) drug therapy: Secondary | ICD-10-CM | POA: Diagnosis not present

## 2021-11-09 DIAGNOSIS — Z9181 History of falling: Secondary | ICD-10-CM | POA: Diagnosis not present

## 2021-11-09 DIAGNOSIS — R451 Restlessness and agitation: Secondary | ICD-10-CM | POA: Diagnosis not present

## 2021-11-09 DIAGNOSIS — R531 Weakness: Secondary | ICD-10-CM | POA: Diagnosis not present

## 2021-11-09 DIAGNOSIS — Z9714 Presence of artificial left leg (complete) (partial): Secondary | ICD-10-CM | POA: Diagnosis not present

## 2021-11-09 DIAGNOSIS — N183 Chronic kidney disease, stage 3 unspecified: Secondary | ICD-10-CM | POA: Diagnosis not present

## 2021-11-09 DIAGNOSIS — Z20822 Contact with and (suspected) exposure to covid-19: Secondary | ICD-10-CM | POA: Diagnosis not present

## 2021-11-10 DIAGNOSIS — Z20822 Contact with and (suspected) exposure to covid-19: Secondary | ICD-10-CM | POA: Diagnosis not present

## 2021-11-10 DIAGNOSIS — Z87891 Personal history of nicotine dependence: Secondary | ICD-10-CM | POA: Diagnosis not present

## 2021-11-10 DIAGNOSIS — Z79899 Other long term (current) drug therapy: Secondary | ICD-10-CM | POA: Diagnosis not present

## 2021-11-10 DIAGNOSIS — I509 Heart failure, unspecified: Secondary | ICD-10-CM | POA: Diagnosis not present

## 2021-11-10 DIAGNOSIS — I4891 Unspecified atrial fibrillation: Secondary | ICD-10-CM | POA: Diagnosis not present

## 2021-11-10 DIAGNOSIS — R531 Weakness: Secondary | ICD-10-CM | POA: Diagnosis not present

## 2021-11-10 DIAGNOSIS — Z7409 Other reduced mobility: Secondary | ICD-10-CM | POA: Diagnosis not present

## 2021-11-10 DIAGNOSIS — Z7982 Long term (current) use of aspirin: Secondary | ICD-10-CM | POA: Diagnosis not present

## 2021-11-10 DIAGNOSIS — I6782 Cerebral ischemia: Secondary | ICD-10-CM | POA: Diagnosis not present

## 2021-11-10 DIAGNOSIS — Z9714 Presence of artificial left leg (complete) (partial): Secondary | ICD-10-CM | POA: Diagnosis not present

## 2021-11-10 DIAGNOSIS — R451 Restlessness and agitation: Secondary | ICD-10-CM | POA: Diagnosis not present

## 2021-11-10 DIAGNOSIS — N183 Chronic kidney disease, stage 3 unspecified: Secondary | ICD-10-CM | POA: Diagnosis not present

## 2021-11-10 DIAGNOSIS — Z9181 History of falling: Secondary | ICD-10-CM | POA: Diagnosis not present

## 2021-11-10 DIAGNOSIS — I130 Hypertensive heart and chronic kidney disease with heart failure and stage 1 through stage 4 chronic kidney disease, or unspecified chronic kidney disease: Secondary | ICD-10-CM | POA: Diagnosis not present

## 2021-11-11 DIAGNOSIS — Z79899 Other long term (current) drug therapy: Secondary | ICD-10-CM | POA: Diagnosis not present

## 2021-11-11 DIAGNOSIS — Z87891 Personal history of nicotine dependence: Secondary | ICD-10-CM | POA: Diagnosis not present

## 2021-11-11 DIAGNOSIS — Z20822 Contact with and (suspected) exposure to covid-19: Secondary | ICD-10-CM | POA: Diagnosis not present

## 2021-11-11 DIAGNOSIS — R531 Weakness: Secondary | ICD-10-CM | POA: Diagnosis not present

## 2021-11-11 DIAGNOSIS — N183 Chronic kidney disease, stage 3 unspecified: Secondary | ICD-10-CM | POA: Diagnosis not present

## 2021-11-11 DIAGNOSIS — R451 Restlessness and agitation: Secondary | ICD-10-CM | POA: Diagnosis not present

## 2021-11-11 DIAGNOSIS — Z9714 Presence of artificial left leg (complete) (partial): Secondary | ICD-10-CM | POA: Diagnosis not present

## 2021-11-11 DIAGNOSIS — Z7409 Other reduced mobility: Secondary | ICD-10-CM | POA: Diagnosis not present

## 2021-11-11 DIAGNOSIS — I509 Heart failure, unspecified: Secondary | ICD-10-CM | POA: Diagnosis not present

## 2021-11-11 DIAGNOSIS — Z9181 History of falling: Secondary | ICD-10-CM | POA: Diagnosis not present

## 2021-11-11 DIAGNOSIS — I130 Hypertensive heart and chronic kidney disease with heart failure and stage 1 through stage 4 chronic kidney disease, or unspecified chronic kidney disease: Secondary | ICD-10-CM | POA: Diagnosis not present

## 2021-11-11 DIAGNOSIS — I4891 Unspecified atrial fibrillation: Secondary | ICD-10-CM | POA: Diagnosis not present

## 2021-11-11 DIAGNOSIS — Z7982 Long term (current) use of aspirin: Secondary | ICD-10-CM | POA: Diagnosis not present

## 2021-11-12 DIAGNOSIS — Z87891 Personal history of nicotine dependence: Secondary | ICD-10-CM | POA: Diagnosis not present

## 2021-11-12 DIAGNOSIS — Z20822 Contact with and (suspected) exposure to covid-19: Secondary | ICD-10-CM | POA: Diagnosis not present

## 2021-11-12 DIAGNOSIS — Z9714 Presence of artificial left leg (complete) (partial): Secondary | ICD-10-CM | POA: Diagnosis not present

## 2021-11-12 DIAGNOSIS — N183 Chronic kidney disease, stage 3 unspecified: Secondary | ICD-10-CM | POA: Diagnosis not present

## 2021-11-12 DIAGNOSIS — R451 Restlessness and agitation: Secondary | ICD-10-CM | POA: Diagnosis not present

## 2021-11-12 DIAGNOSIS — I509 Heart failure, unspecified: Secondary | ICD-10-CM | POA: Diagnosis not present

## 2021-11-12 DIAGNOSIS — Z79899 Other long term (current) drug therapy: Secondary | ICD-10-CM | POA: Diagnosis not present

## 2021-11-12 DIAGNOSIS — Z7982 Long term (current) use of aspirin: Secondary | ICD-10-CM | POA: Diagnosis not present

## 2021-11-12 DIAGNOSIS — Z7409 Other reduced mobility: Secondary | ICD-10-CM | POA: Diagnosis not present

## 2021-11-12 DIAGNOSIS — Z9181 History of falling: Secondary | ICD-10-CM | POA: Diagnosis not present

## 2021-11-12 DIAGNOSIS — R531 Weakness: Secondary | ICD-10-CM | POA: Diagnosis not present

## 2021-11-12 DIAGNOSIS — I130 Hypertensive heart and chronic kidney disease with heart failure and stage 1 through stage 4 chronic kidney disease, or unspecified chronic kidney disease: Secondary | ICD-10-CM | POA: Diagnosis not present

## 2021-11-12 DIAGNOSIS — I4891 Unspecified atrial fibrillation: Secondary | ICD-10-CM | POA: Diagnosis not present

## 2021-11-13 DIAGNOSIS — Z9181 History of falling: Secondary | ICD-10-CM | POA: Diagnosis not present

## 2021-11-13 DIAGNOSIS — I4891 Unspecified atrial fibrillation: Secondary | ICD-10-CM | POA: Diagnosis not present

## 2021-11-13 DIAGNOSIS — Z7982 Long term (current) use of aspirin: Secondary | ICD-10-CM | POA: Diagnosis not present

## 2021-11-13 DIAGNOSIS — Z79899 Other long term (current) drug therapy: Secondary | ICD-10-CM | POA: Diagnosis not present

## 2021-11-13 DIAGNOSIS — Z20822 Contact with and (suspected) exposure to covid-19: Secondary | ICD-10-CM | POA: Diagnosis not present

## 2021-11-13 DIAGNOSIS — R451 Restlessness and agitation: Secondary | ICD-10-CM | POA: Diagnosis not present

## 2021-11-13 DIAGNOSIS — I509 Heart failure, unspecified: Secondary | ICD-10-CM | POA: Diagnosis not present

## 2021-11-13 DIAGNOSIS — Z7409 Other reduced mobility: Secondary | ICD-10-CM | POA: Diagnosis not present

## 2021-11-13 DIAGNOSIS — Z87891 Personal history of nicotine dependence: Secondary | ICD-10-CM | POA: Diagnosis not present

## 2021-11-13 DIAGNOSIS — N183 Chronic kidney disease, stage 3 unspecified: Secondary | ICD-10-CM | POA: Diagnosis not present

## 2021-11-13 DIAGNOSIS — Z9714 Presence of artificial left leg (complete) (partial): Secondary | ICD-10-CM | POA: Diagnosis not present

## 2021-11-13 DIAGNOSIS — R531 Weakness: Secondary | ICD-10-CM | POA: Diagnosis not present

## 2021-11-13 DIAGNOSIS — I130 Hypertensive heart and chronic kidney disease with heart failure and stage 1 through stage 4 chronic kidney disease, or unspecified chronic kidney disease: Secondary | ICD-10-CM | POA: Diagnosis not present

## 2021-11-14 DIAGNOSIS — R451 Restlessness and agitation: Secondary | ICD-10-CM | POA: Diagnosis not present

## 2021-11-14 DIAGNOSIS — Z7982 Long term (current) use of aspirin: Secondary | ICD-10-CM | POA: Diagnosis not present

## 2021-11-14 DIAGNOSIS — N183 Chronic kidney disease, stage 3 unspecified: Secondary | ICD-10-CM | POA: Diagnosis not present

## 2021-11-14 DIAGNOSIS — R531 Weakness: Secondary | ICD-10-CM | POA: Diagnosis not present

## 2021-11-14 DIAGNOSIS — Z87891 Personal history of nicotine dependence: Secondary | ICD-10-CM | POA: Diagnosis not present

## 2021-11-14 DIAGNOSIS — Z20822 Contact with and (suspected) exposure to covid-19: Secondary | ICD-10-CM | POA: Diagnosis not present

## 2021-11-14 DIAGNOSIS — Z7409 Other reduced mobility: Secondary | ICD-10-CM | POA: Diagnosis not present

## 2021-11-14 DIAGNOSIS — I509 Heart failure, unspecified: Secondary | ICD-10-CM | POA: Diagnosis not present

## 2021-11-14 DIAGNOSIS — I130 Hypertensive heart and chronic kidney disease with heart failure and stage 1 through stage 4 chronic kidney disease, or unspecified chronic kidney disease: Secondary | ICD-10-CM | POA: Diagnosis not present

## 2021-11-14 DIAGNOSIS — I4891 Unspecified atrial fibrillation: Secondary | ICD-10-CM | POA: Diagnosis not present

## 2021-11-14 DIAGNOSIS — Z9714 Presence of artificial left leg (complete) (partial): Secondary | ICD-10-CM | POA: Diagnosis not present

## 2021-11-14 DIAGNOSIS — Z9181 History of falling: Secondary | ICD-10-CM | POA: Diagnosis not present

## 2021-11-14 DIAGNOSIS — Z79899 Other long term (current) drug therapy: Secondary | ICD-10-CM | POA: Diagnosis not present

## 2021-11-15 DIAGNOSIS — Z20822 Contact with and (suspected) exposure to covid-19: Secondary | ICD-10-CM | POA: Diagnosis not present

## 2021-11-15 DIAGNOSIS — I4891 Unspecified atrial fibrillation: Secondary | ICD-10-CM | POA: Diagnosis not present

## 2021-11-15 DIAGNOSIS — Z9714 Presence of artificial left leg (complete) (partial): Secondary | ICD-10-CM | POA: Diagnosis not present

## 2021-11-15 DIAGNOSIS — I509 Heart failure, unspecified: Secondary | ICD-10-CM | POA: Diagnosis not present

## 2021-11-15 DIAGNOSIS — Z9181 History of falling: Secondary | ICD-10-CM | POA: Diagnosis not present

## 2021-11-15 DIAGNOSIS — N183 Chronic kidney disease, stage 3 unspecified: Secondary | ICD-10-CM | POA: Diagnosis not present

## 2021-11-15 DIAGNOSIS — Z7409 Other reduced mobility: Secondary | ICD-10-CM | POA: Diagnosis not present

## 2021-11-15 DIAGNOSIS — I130 Hypertensive heart and chronic kidney disease with heart failure and stage 1 through stage 4 chronic kidney disease, or unspecified chronic kidney disease: Secondary | ICD-10-CM | POA: Diagnosis not present

## 2021-11-15 DIAGNOSIS — Z87891 Personal history of nicotine dependence: Secondary | ICD-10-CM | POA: Diagnosis not present

## 2021-11-15 DIAGNOSIS — R531 Weakness: Secondary | ICD-10-CM | POA: Diagnosis not present

## 2021-11-15 DIAGNOSIS — Z79899 Other long term (current) drug therapy: Secondary | ICD-10-CM | POA: Diagnosis not present

## 2021-11-15 DIAGNOSIS — R451 Restlessness and agitation: Secondary | ICD-10-CM | POA: Diagnosis not present

## 2021-11-15 DIAGNOSIS — Z7982 Long term (current) use of aspirin: Secondary | ICD-10-CM | POA: Diagnosis not present

## 2021-11-16 DIAGNOSIS — N183 Chronic kidney disease, stage 3 unspecified: Secondary | ICD-10-CM | POA: Diagnosis not present

## 2021-11-16 DIAGNOSIS — R8271 Bacteriuria: Secondary | ICD-10-CM | POA: Diagnosis not present

## 2021-11-16 DIAGNOSIS — Z743 Need for continuous supervision: Secondary | ICD-10-CM | POA: Diagnosis not present

## 2021-11-16 DIAGNOSIS — Z7982 Long term (current) use of aspirin: Secondary | ICD-10-CM | POA: Diagnosis not present

## 2021-11-16 DIAGNOSIS — Z9714 Presence of artificial left leg (complete) (partial): Secondary | ICD-10-CM | POA: Diagnosis not present

## 2021-11-16 DIAGNOSIS — Z87891 Personal history of nicotine dependence: Secondary | ICD-10-CM | POA: Diagnosis not present

## 2021-11-16 DIAGNOSIS — Z9181 History of falling: Secondary | ICD-10-CM | POA: Diagnosis not present

## 2021-11-16 DIAGNOSIS — Z79899 Other long term (current) drug therapy: Secondary | ICD-10-CM | POA: Diagnosis not present

## 2021-11-16 DIAGNOSIS — R404 Transient alteration of awareness: Secondary | ICD-10-CM | POA: Diagnosis not present

## 2021-11-16 DIAGNOSIS — R531 Weakness: Secondary | ICD-10-CM | POA: Diagnosis not present

## 2021-11-16 DIAGNOSIS — F03C11 Unspecified dementia, severe, with agitation: Secondary | ICD-10-CM | POA: Diagnosis not present

## 2021-11-16 DIAGNOSIS — I4891 Unspecified atrial fibrillation: Secondary | ICD-10-CM | POA: Diagnosis not present

## 2021-11-16 DIAGNOSIS — Z7409 Other reduced mobility: Secondary | ICD-10-CM | POA: Diagnosis not present

## 2021-11-16 DIAGNOSIS — I509 Heart failure, unspecified: Secondary | ICD-10-CM | POA: Diagnosis not present

## 2021-11-16 DIAGNOSIS — Z20822 Contact with and (suspected) exposure to covid-19: Secondary | ICD-10-CM | POA: Diagnosis not present

## 2021-11-16 DIAGNOSIS — R451 Restlessness and agitation: Secondary | ICD-10-CM | POA: Diagnosis not present

## 2021-11-16 DIAGNOSIS — I130 Hypertensive heart and chronic kidney disease with heart failure and stage 1 through stage 4 chronic kidney disease, or unspecified chronic kidney disease: Secondary | ICD-10-CM | POA: Diagnosis not present

## 2021-11-17 DIAGNOSIS — R296 Repeated falls: Secondary | ICD-10-CM | POA: Diagnosis not present

## 2021-11-18 DIAGNOSIS — R296 Repeated falls: Secondary | ICD-10-CM | POA: Diagnosis not present

## 2021-11-21 DIAGNOSIS — I10 Essential (primary) hypertension: Secondary | ICD-10-CM | POA: Diagnosis not present

## 2021-11-22 DIAGNOSIS — R296 Repeated falls: Secondary | ICD-10-CM | POA: Diagnosis not present

## 2021-11-23 DIAGNOSIS — K219 Gastro-esophageal reflux disease without esophagitis: Secondary | ICD-10-CM | POA: Diagnosis not present

## 2021-11-23 DIAGNOSIS — E785 Hyperlipidemia, unspecified: Secondary | ICD-10-CM | POA: Diagnosis not present

## 2021-11-23 DIAGNOSIS — I480 Paroxysmal atrial fibrillation: Secondary | ICD-10-CM | POA: Diagnosis not present

## 2021-11-23 DIAGNOSIS — I10 Essential (primary) hypertension: Secondary | ICD-10-CM | POA: Diagnosis not present

## 2021-11-23 DIAGNOSIS — J45909 Unspecified asthma, uncomplicated: Secondary | ICD-10-CM | POA: Diagnosis not present

## 2021-11-23 DIAGNOSIS — N183 Chronic kidney disease, stage 3 unspecified: Secondary | ICD-10-CM | POA: Diagnosis not present

## 2021-11-23 DIAGNOSIS — E1165 Type 2 diabetes mellitus with hyperglycemia: Secondary | ICD-10-CM | POA: Diagnosis not present

## 2021-11-25 DIAGNOSIS — K921 Melena: Secondary | ICD-10-CM | POA: Diagnosis not present

## 2021-11-25 DIAGNOSIS — R58 Hemorrhage, not elsewhere classified: Secondary | ICD-10-CM | POA: Diagnosis not present

## 2021-11-25 DIAGNOSIS — Z20822 Contact with and (suspected) exposure to covid-19: Secondary | ICD-10-CM | POA: Diagnosis not present

## 2021-11-25 DIAGNOSIS — R059 Cough, unspecified: Secondary | ICD-10-CM | POA: Diagnosis not present

## 2021-11-25 DIAGNOSIS — K30 Functional dyspepsia: Secondary | ICD-10-CM | POA: Diagnosis not present

## 2021-11-25 DIAGNOSIS — R197 Diarrhea, unspecified: Secondary | ICD-10-CM | POA: Diagnosis not present

## 2021-11-25 DIAGNOSIS — R410 Disorientation, unspecified: Secondary | ICD-10-CM | POA: Diagnosis not present

## 2021-11-25 DIAGNOSIS — D649 Anemia, unspecified: Secondary | ICD-10-CM | POA: Diagnosis not present

## 2021-11-25 DIAGNOSIS — E1165 Type 2 diabetes mellitus with hyperglycemia: Secondary | ICD-10-CM | POA: Diagnosis not present

## 2021-11-25 DIAGNOSIS — Z743 Need for continuous supervision: Secondary | ICD-10-CM | POA: Diagnosis not present

## 2021-11-25 DIAGNOSIS — Z7401 Bed confinement status: Secondary | ICD-10-CM | POA: Diagnosis not present

## 2021-11-29 DIAGNOSIS — N390 Urinary tract infection, site not specified: Secondary | ICD-10-CM | POA: Diagnosis not present

## 2021-11-30 DIAGNOSIS — D649 Anemia, unspecified: Secondary | ICD-10-CM | POA: Diagnosis not present

## 2021-11-30 DIAGNOSIS — R059 Cough, unspecified: Secondary | ICD-10-CM | POA: Diagnosis not present

## 2021-11-30 DIAGNOSIS — R197 Diarrhea, unspecified: Secondary | ICD-10-CM | POA: Diagnosis not present

## 2021-11-30 DIAGNOSIS — K921 Melena: Secondary | ICD-10-CM | POA: Diagnosis not present

## 2021-12-04 DIAGNOSIS — R296 Repeated falls: Secondary | ICD-10-CM | POA: Diagnosis not present

## 2021-12-05 DIAGNOSIS — E1159 Type 2 diabetes mellitus with other circulatory complications: Secondary | ICD-10-CM | POA: Diagnosis not present

## 2021-12-05 DIAGNOSIS — B351 Tinea unguium: Secondary | ICD-10-CM | POA: Diagnosis not present

## 2021-12-07 DIAGNOSIS — J45909 Unspecified asthma, uncomplicated: Secondary | ICD-10-CM | POA: Diagnosis not present

## 2021-12-07 DIAGNOSIS — D518 Other vitamin B12 deficiency anemias: Secondary | ICD-10-CM | POA: Diagnosis not present

## 2021-12-07 DIAGNOSIS — E038 Other specified hypothyroidism: Secondary | ICD-10-CM | POA: Diagnosis not present

## 2021-12-07 DIAGNOSIS — E119 Type 2 diabetes mellitus without complications: Secondary | ICD-10-CM | POA: Diagnosis not present

## 2021-12-07 DIAGNOSIS — E785 Hyperlipidemia, unspecified: Secondary | ICD-10-CM | POA: Diagnosis not present

## 2021-12-07 DIAGNOSIS — N183 Chronic kidney disease, stage 3 unspecified: Secondary | ICD-10-CM | POA: Diagnosis not present

## 2021-12-07 DIAGNOSIS — I5032 Chronic diastolic (congestive) heart failure: Secondary | ICD-10-CM | POA: Diagnosis not present

## 2021-12-07 DIAGNOSIS — R296 Repeated falls: Secondary | ICD-10-CM | POA: Diagnosis not present

## 2021-12-07 DIAGNOSIS — I480 Paroxysmal atrial fibrillation: Secondary | ICD-10-CM | POA: Diagnosis not present

## 2021-12-07 DIAGNOSIS — E559 Vitamin D deficiency, unspecified: Secondary | ICD-10-CM | POA: Diagnosis not present

## 2021-12-07 DIAGNOSIS — I10 Essential (primary) hypertension: Secondary | ICD-10-CM | POA: Diagnosis not present

## 2021-12-07 DIAGNOSIS — N390 Urinary tract infection, site not specified: Secondary | ICD-10-CM | POA: Diagnosis not present

## 2021-12-07 DIAGNOSIS — E1165 Type 2 diabetes mellitus with hyperglycemia: Secondary | ICD-10-CM | POA: Diagnosis not present

## 2021-12-10 DIAGNOSIS — E1165 Type 2 diabetes mellitus with hyperglycemia: Secondary | ICD-10-CM | POA: Diagnosis not present

## 2021-12-10 DIAGNOSIS — R531 Weakness: Secondary | ICD-10-CM | POA: Diagnosis not present

## 2021-12-10 DIAGNOSIS — Z993 Dependence on wheelchair: Secondary | ICD-10-CM | POA: Diagnosis not present

## 2021-12-10 DIAGNOSIS — Z79899 Other long term (current) drug therapy: Secondary | ICD-10-CM | POA: Diagnosis not present

## 2021-12-10 DIAGNOSIS — Z743 Need for continuous supervision: Secondary | ICD-10-CM | POA: Diagnosis not present

## 2021-12-10 DIAGNOSIS — Z89512 Acquired absence of left leg below knee: Secondary | ICD-10-CM | POA: Diagnosis not present

## 2021-12-10 DIAGNOSIS — Z7901 Long term (current) use of anticoagulants: Secondary | ICD-10-CM | POA: Diagnosis not present

## 2021-12-10 DIAGNOSIS — I509 Heart failure, unspecified: Secondary | ICD-10-CM | POA: Diagnosis not present

## 2021-12-10 DIAGNOSIS — K921 Melena: Secondary | ICD-10-CM | POA: Diagnosis not present

## 2021-12-10 DIAGNOSIS — R9082 White matter disease, unspecified: Secondary | ICD-10-CM | POA: Diagnosis not present

## 2021-12-10 DIAGNOSIS — K922 Gastrointestinal hemorrhage, unspecified: Secondary | ICD-10-CM | POA: Diagnosis not present

## 2021-12-10 DIAGNOSIS — I10 Essential (primary) hypertension: Secondary | ICD-10-CM | POA: Diagnosis not present

## 2021-12-10 DIAGNOSIS — Y998 Other external cause status: Secondary | ICD-10-CM | POA: Diagnosis not present

## 2021-12-10 DIAGNOSIS — K635 Polyp of colon: Secondary | ICD-10-CM | POA: Diagnosis not present

## 2021-12-10 DIAGNOSIS — D649 Anemia, unspecified: Secondary | ICD-10-CM | POA: Diagnosis not present

## 2021-12-10 DIAGNOSIS — I110 Hypertensive heart disease with heart failure: Secondary | ICD-10-CM | POA: Diagnosis not present

## 2021-12-10 DIAGNOSIS — Z8673 Personal history of transient ischemic attack (TIA), and cerebral infarction without residual deficits: Secondary | ICD-10-CM | POA: Diagnosis not present

## 2021-12-10 DIAGNOSIS — W19XXXA Unspecified fall, initial encounter: Secondary | ICD-10-CM | POA: Diagnosis not present

## 2021-12-10 DIAGNOSIS — Z794 Long term (current) use of insulin: Secondary | ICD-10-CM | POA: Diagnosis not present

## 2021-12-10 DIAGNOSIS — I6381 Other cerebral infarction due to occlusion or stenosis of small artery: Secondary | ICD-10-CM | POA: Diagnosis not present

## 2021-12-10 DIAGNOSIS — S80811A Abrasion, right lower leg, initial encounter: Secondary | ICD-10-CM | POA: Diagnosis not present

## 2021-12-10 DIAGNOSIS — I480 Paroxysmal atrial fibrillation: Secondary | ICD-10-CM | POA: Diagnosis not present

## 2021-12-10 DIAGNOSIS — F03918 Unspecified dementia, unspecified severity, with other behavioral disturbance: Secondary | ICD-10-CM | POA: Diagnosis not present

## 2021-12-10 DIAGNOSIS — K449 Diaphragmatic hernia without obstruction or gangrene: Secondary | ICD-10-CM | POA: Diagnosis not present

## 2021-12-10 DIAGNOSIS — S0990XA Unspecified injury of head, initial encounter: Secondary | ICD-10-CM | POA: Diagnosis not present

## 2021-12-10 DIAGNOSIS — R296 Repeated falls: Secondary | ICD-10-CM | POA: Diagnosis not present

## 2021-12-10 DIAGNOSIS — W01198A Fall on same level from slipping, tripping and stumbling with subsequent striking against other object, initial encounter: Secondary | ICD-10-CM | POA: Diagnosis not present

## 2021-12-11 DIAGNOSIS — E1151 Type 2 diabetes mellitus with diabetic peripheral angiopathy without gangrene: Secondary | ICD-10-CM | POA: Diagnosis not present

## 2021-12-11 DIAGNOSIS — I480 Paroxysmal atrial fibrillation: Secondary | ICD-10-CM | POA: Diagnosis not present

## 2021-12-11 DIAGNOSIS — Z89512 Acquired absence of left leg below knee: Secondary | ICD-10-CM | POA: Diagnosis not present

## 2021-12-11 DIAGNOSIS — F03918 Unspecified dementia, unspecified severity, with other behavioral disturbance: Secondary | ICD-10-CM | POA: Diagnosis not present

## 2021-12-11 DIAGNOSIS — R195 Other fecal abnormalities: Secondary | ICD-10-CM | POA: Diagnosis not present

## 2021-12-11 DIAGNOSIS — Z993 Dependence on wheelchair: Secondary | ICD-10-CM | POA: Diagnosis not present

## 2021-12-11 DIAGNOSIS — D649 Anemia, unspecified: Secondary | ICD-10-CM | POA: Diagnosis not present

## 2021-12-11 DIAGNOSIS — I10 Essential (primary) hypertension: Secondary | ICD-10-CM | POA: Diagnosis not present

## 2021-12-11 DIAGNOSIS — I6789 Other cerebrovascular disease: Secondary | ICD-10-CM | POA: Diagnosis not present

## 2021-12-12 DIAGNOSIS — I6789 Other cerebrovascular disease: Secondary | ICD-10-CM | POA: Diagnosis not present

## 2021-12-12 DIAGNOSIS — E1151 Type 2 diabetes mellitus with diabetic peripheral angiopathy without gangrene: Secondary | ICD-10-CM | POA: Diagnosis not present

## 2021-12-12 DIAGNOSIS — R195 Other fecal abnormalities: Secondary | ICD-10-CM | POA: Diagnosis not present

## 2021-12-12 DIAGNOSIS — I4891 Unspecified atrial fibrillation: Secondary | ICD-10-CM | POA: Diagnosis not present

## 2021-12-12 DIAGNOSIS — Z993 Dependence on wheelchair: Secondary | ICD-10-CM | POA: Diagnosis not present

## 2021-12-12 DIAGNOSIS — I480 Paroxysmal atrial fibrillation: Secondary | ICD-10-CM | POA: Diagnosis not present

## 2021-12-12 DIAGNOSIS — D649 Anemia, unspecified: Secondary | ICD-10-CM | POA: Diagnosis not present

## 2021-12-12 DIAGNOSIS — I10 Essential (primary) hypertension: Secondary | ICD-10-CM | POA: Diagnosis not present

## 2021-12-12 DIAGNOSIS — Z89512 Acquired absence of left leg below knee: Secondary | ICD-10-CM | POA: Diagnosis not present

## 2021-12-12 DIAGNOSIS — Z7901 Long term (current) use of anticoagulants: Secondary | ICD-10-CM | POA: Diagnosis not present

## 2021-12-12 DIAGNOSIS — F03918 Unspecified dementia, unspecified severity, with other behavioral disturbance: Secondary | ICD-10-CM | POA: Diagnosis not present

## 2021-12-13 DIAGNOSIS — F03918 Unspecified dementia, unspecified severity, with other behavioral disturbance: Secondary | ICD-10-CM | POA: Diagnosis not present

## 2021-12-13 DIAGNOSIS — D649 Anemia, unspecified: Secondary | ICD-10-CM | POA: Diagnosis not present

## 2021-12-13 DIAGNOSIS — R195 Other fecal abnormalities: Secondary | ICD-10-CM | POA: Diagnosis not present

## 2021-12-13 DIAGNOSIS — Z89512 Acquired absence of left leg below knee: Secondary | ICD-10-CM | POA: Diagnosis not present

## 2021-12-13 DIAGNOSIS — I6789 Other cerebrovascular disease: Secondary | ICD-10-CM | POA: Diagnosis not present

## 2021-12-13 DIAGNOSIS — Z95828 Presence of other vascular implants and grafts: Secondary | ICD-10-CM | POA: Diagnosis not present

## 2021-12-13 DIAGNOSIS — E1151 Type 2 diabetes mellitus with diabetic peripheral angiopathy without gangrene: Secondary | ICD-10-CM | POA: Diagnosis not present

## 2021-12-13 DIAGNOSIS — Z993 Dependence on wheelchair: Secondary | ICD-10-CM | POA: Diagnosis not present

## 2021-12-13 DIAGNOSIS — Z794 Long term (current) use of insulin: Secondary | ICD-10-CM | POA: Diagnosis not present

## 2021-12-13 DIAGNOSIS — I10 Essential (primary) hypertension: Secondary | ICD-10-CM | POA: Diagnosis not present

## 2021-12-13 DIAGNOSIS — Z7901 Long term (current) use of anticoagulants: Secondary | ICD-10-CM | POA: Diagnosis not present

## 2021-12-13 DIAGNOSIS — I480 Paroxysmal atrial fibrillation: Secondary | ICD-10-CM | POA: Diagnosis not present

## 2021-12-14 DIAGNOSIS — K449 Diaphragmatic hernia without obstruction or gangrene: Secondary | ICD-10-CM | POA: Diagnosis not present

## 2021-12-14 DIAGNOSIS — D649 Anemia, unspecified: Secondary | ICD-10-CM | POA: Diagnosis not present

## 2021-12-14 DIAGNOSIS — K635 Polyp of colon: Secondary | ICD-10-CM | POA: Diagnosis not present

## 2021-12-14 DIAGNOSIS — D509 Iron deficiency anemia, unspecified: Secondary | ICD-10-CM | POA: Diagnosis not present

## 2021-12-14 DIAGNOSIS — K259 Gastric ulcer, unspecified as acute or chronic, without hemorrhage or perforation: Secondary | ICD-10-CM | POA: Diagnosis not present

## 2021-12-14 DIAGNOSIS — I480 Paroxysmal atrial fibrillation: Secondary | ICD-10-CM | POA: Diagnosis not present

## 2021-12-14 DIAGNOSIS — D123 Benign neoplasm of transverse colon: Secondary | ICD-10-CM | POA: Diagnosis not present

## 2021-12-14 DIAGNOSIS — K3189 Other diseases of stomach and duodenum: Secondary | ICD-10-CM | POA: Diagnosis not present

## 2021-12-14 DIAGNOSIS — R195 Other fecal abnormalities: Secondary | ICD-10-CM | POA: Diagnosis not present

## 2021-12-15 DIAGNOSIS — Z743 Need for continuous supervision: Secondary | ICD-10-CM | POA: Diagnosis not present

## 2021-12-15 DIAGNOSIS — E1151 Type 2 diabetes mellitus with diabetic peripheral angiopathy without gangrene: Secondary | ICD-10-CM | POA: Diagnosis not present

## 2021-12-15 DIAGNOSIS — Z794 Long term (current) use of insulin: Secondary | ICD-10-CM | POA: Diagnosis not present

## 2021-12-15 DIAGNOSIS — I10 Essential (primary) hypertension: Secondary | ICD-10-CM | POA: Diagnosis not present

## 2021-12-15 DIAGNOSIS — Z993 Dependence on wheelchair: Secondary | ICD-10-CM | POA: Diagnosis not present

## 2021-12-15 DIAGNOSIS — Z89512 Acquired absence of left leg below knee: Secondary | ICD-10-CM | POA: Diagnosis not present

## 2021-12-15 DIAGNOSIS — R29898 Other symptoms and signs involving the musculoskeletal system: Secondary | ICD-10-CM | POA: Diagnosis not present

## 2021-12-15 DIAGNOSIS — I480 Paroxysmal atrial fibrillation: Secondary | ICD-10-CM | POA: Diagnosis not present

## 2021-12-15 DIAGNOSIS — Z7901 Long term (current) use of anticoagulants: Secondary | ICD-10-CM | POA: Diagnosis not present

## 2021-12-15 DIAGNOSIS — Z95828 Presence of other vascular implants and grafts: Secondary | ICD-10-CM | POA: Diagnosis not present

## 2021-12-15 DIAGNOSIS — I6789 Other cerebrovascular disease: Secondary | ICD-10-CM | POA: Diagnosis not present

## 2021-12-15 DIAGNOSIS — R195 Other fecal abnormalities: Secondary | ICD-10-CM | POA: Diagnosis not present

## 2021-12-15 DIAGNOSIS — F03918 Unspecified dementia, unspecified severity, with other behavioral disturbance: Secondary | ICD-10-CM | POA: Diagnosis not present

## 2021-12-20 DIAGNOSIS — E1165 Type 2 diabetes mellitus with hyperglycemia: Secondary | ICD-10-CM | POA: Diagnosis not present

## 2021-12-21 DIAGNOSIS — R296 Repeated falls: Secondary | ICD-10-CM | POA: Diagnosis not present

## 2021-12-21 DIAGNOSIS — D508 Other iron deficiency anemias: Secondary | ICD-10-CM | POA: Diagnosis not present

## 2021-12-22 DIAGNOSIS — I10 Essential (primary) hypertension: Secondary | ICD-10-CM | POA: Diagnosis not present

## 2021-12-23 DIAGNOSIS — E1140 Type 2 diabetes mellitus with diabetic neuropathy, unspecified: Secondary | ICD-10-CM | POA: Diagnosis not present

## 2021-12-23 DIAGNOSIS — B351 Tinea unguium: Secondary | ICD-10-CM | POA: Diagnosis not present

## 2021-12-23 DIAGNOSIS — M2041 Other hammer toe(s) (acquired), right foot: Secondary | ICD-10-CM | POA: Diagnosis not present

## 2021-12-23 DIAGNOSIS — L600 Ingrowing nail: Secondary | ICD-10-CM | POA: Diagnosis not present

## 2021-12-23 DIAGNOSIS — M79671 Pain in right foot: Secondary | ICD-10-CM | POA: Diagnosis not present

## 2021-12-23 DIAGNOSIS — S78112A Complete traumatic amputation at level between left hip and knee, initial encounter: Secondary | ICD-10-CM | POA: Diagnosis not present

## 2021-12-25 DIAGNOSIS — R296 Repeated falls: Secondary | ICD-10-CM | POA: Diagnosis not present

## 2021-12-27 DIAGNOSIS — I480 Paroxysmal atrial fibrillation: Secondary | ICD-10-CM | POA: Diagnosis not present

## 2021-12-27 DIAGNOSIS — E119 Type 2 diabetes mellitus without complications: Secondary | ICD-10-CM | POA: Diagnosis not present

## 2021-12-27 DIAGNOSIS — J45909 Unspecified asthma, uncomplicated: Secondary | ICD-10-CM | POA: Diagnosis not present

## 2021-12-27 DIAGNOSIS — I5032 Chronic diastolic (congestive) heart failure: Secondary | ICD-10-CM | POA: Diagnosis not present

## 2021-12-27 DIAGNOSIS — N183 Chronic kidney disease, stage 3 unspecified: Secondary | ICD-10-CM | POA: Diagnosis not present

## 2021-12-27 DIAGNOSIS — E559 Vitamin D deficiency, unspecified: Secondary | ICD-10-CM | POA: Diagnosis not present

## 2021-12-27 DIAGNOSIS — D518 Other vitamin B12 deficiency anemias: Secondary | ICD-10-CM | POA: Diagnosis not present

## 2021-12-27 DIAGNOSIS — E1165 Type 2 diabetes mellitus with hyperglycemia: Secondary | ICD-10-CM | POA: Diagnosis not present

## 2021-12-27 DIAGNOSIS — E785 Hyperlipidemia, unspecified: Secondary | ICD-10-CM | POA: Diagnosis not present

## 2021-12-27 DIAGNOSIS — E038 Other specified hypothyroidism: Secondary | ICD-10-CM | POA: Diagnosis not present

## 2021-12-27 DIAGNOSIS — I10 Essential (primary) hypertension: Secondary | ICD-10-CM | POA: Diagnosis not present

## 2021-12-28 DIAGNOSIS — E559 Vitamin D deficiency, unspecified: Secondary | ICD-10-CM | POA: Diagnosis not present

## 2021-12-28 DIAGNOSIS — E7849 Other hyperlipidemia: Secondary | ICD-10-CM | POA: Diagnosis not present

## 2021-12-28 DIAGNOSIS — D518 Other vitamin B12 deficiency anemias: Secondary | ICD-10-CM | POA: Diagnosis not present

## 2021-12-28 DIAGNOSIS — E119 Type 2 diabetes mellitus without complications: Secondary | ICD-10-CM | POA: Diagnosis not present

## 2021-12-28 DIAGNOSIS — Z79899 Other long term (current) drug therapy: Secondary | ICD-10-CM | POA: Diagnosis not present

## 2021-12-28 DIAGNOSIS — R296 Repeated falls: Secondary | ICD-10-CM | POA: Diagnosis not present

## 2022-01-08 DIAGNOSIS — R197 Diarrhea, unspecified: Secondary | ICD-10-CM | POA: Diagnosis not present

## 2022-01-09 DIAGNOSIS — R300 Dysuria: Secondary | ICD-10-CM | POA: Diagnosis not present

## 2022-01-11 DIAGNOSIS — E11628 Type 2 diabetes mellitus with other skin complications: Secondary | ICD-10-CM | POA: Diagnosis not present

## 2022-01-11 DIAGNOSIS — S81801A Unspecified open wound, right lower leg, initial encounter: Secondary | ICD-10-CM | POA: Diagnosis not present

## 2022-01-11 DIAGNOSIS — N390 Urinary tract infection, site not specified: Secondary | ICD-10-CM | POA: Diagnosis not present

## 2022-01-11 DIAGNOSIS — D235 Other benign neoplasm of skin of trunk: Secondary | ICD-10-CM | POA: Diagnosis not present

## 2022-01-11 DIAGNOSIS — L14 Bullous disorders in diseases classified elsewhere: Secondary | ICD-10-CM | POA: Diagnosis not present

## 2022-01-13 DIAGNOSIS — I110 Hypertensive heart disease with heart failure: Secondary | ICD-10-CM | POA: Diagnosis not present

## 2022-01-13 DIAGNOSIS — E11622 Type 2 diabetes mellitus with other skin ulcer: Secondary | ICD-10-CM | POA: Diagnosis not present

## 2022-01-13 DIAGNOSIS — S81801A Unspecified open wound, right lower leg, initial encounter: Secondary | ICD-10-CM | POA: Diagnosis not present

## 2022-01-13 DIAGNOSIS — I5032 Chronic diastolic (congestive) heart failure: Secondary | ICD-10-CM | POA: Diagnosis not present

## 2022-01-13 DIAGNOSIS — L14 Bullous disorders in diseases classified elsewhere: Secondary | ICD-10-CM | POA: Diagnosis not present

## 2022-01-13 DIAGNOSIS — N183 Chronic kidney disease, stage 3 unspecified: Secondary | ICD-10-CM | POA: Diagnosis not present

## 2022-01-13 DIAGNOSIS — D235 Other benign neoplasm of skin of trunk: Secondary | ICD-10-CM | POA: Diagnosis not present

## 2022-01-13 DIAGNOSIS — Z79899 Other long term (current) drug therapy: Secondary | ICD-10-CM | POA: Diagnosis not present

## 2022-01-13 DIAGNOSIS — Z794 Long term (current) use of insulin: Secondary | ICD-10-CM | POA: Diagnosis not present

## 2022-01-13 DIAGNOSIS — N390 Urinary tract infection, site not specified: Secondary | ICD-10-CM | POA: Diagnosis not present

## 2022-01-13 DIAGNOSIS — S81801D Unspecified open wound, right lower leg, subsequent encounter: Secondary | ICD-10-CM | POA: Diagnosis not present

## 2022-01-18 DIAGNOSIS — E785 Hyperlipidemia, unspecified: Secondary | ICD-10-CM | POA: Diagnosis not present

## 2022-01-18 DIAGNOSIS — I480 Paroxysmal atrial fibrillation: Secondary | ICD-10-CM | POA: Diagnosis not present

## 2022-01-18 DIAGNOSIS — S81801A Unspecified open wound, right lower leg, initial encounter: Secondary | ICD-10-CM | POA: Diagnosis not present

## 2022-01-18 DIAGNOSIS — E11622 Type 2 diabetes mellitus with other skin ulcer: Secondary | ICD-10-CM | POA: Diagnosis not present

## 2022-01-18 DIAGNOSIS — E1165 Type 2 diabetes mellitus with hyperglycemia: Secondary | ICD-10-CM | POA: Diagnosis not present

## 2022-01-18 DIAGNOSIS — E11628 Type 2 diabetes mellitus with other skin complications: Secondary | ICD-10-CM | POA: Diagnosis not present

## 2022-01-18 DIAGNOSIS — I110 Hypertensive heart disease with heart failure: Secondary | ICD-10-CM | POA: Diagnosis not present

## 2022-01-18 DIAGNOSIS — N183 Chronic kidney disease, stage 3 unspecified: Secondary | ICD-10-CM | POA: Diagnosis not present

## 2022-01-18 DIAGNOSIS — D235 Other benign neoplasm of skin of trunk: Secondary | ICD-10-CM | POA: Diagnosis not present

## 2022-01-18 DIAGNOSIS — J45909 Unspecified asthma, uncomplicated: Secondary | ICD-10-CM | POA: Diagnosis not present

## 2022-01-18 DIAGNOSIS — Z794 Long term (current) use of insulin: Secondary | ICD-10-CM | POA: Diagnosis not present

## 2022-01-18 DIAGNOSIS — N390 Urinary tract infection, site not specified: Secondary | ICD-10-CM | POA: Diagnosis not present

## 2022-01-18 DIAGNOSIS — K219 Gastro-esophageal reflux disease without esophagitis: Secondary | ICD-10-CM | POA: Diagnosis not present

## 2022-01-18 DIAGNOSIS — S81801D Unspecified open wound, right lower leg, subsequent encounter: Secondary | ICD-10-CM | POA: Diagnosis not present

## 2022-01-18 DIAGNOSIS — L14 Bullous disorders in diseases classified elsewhere: Secondary | ICD-10-CM | POA: Diagnosis not present

## 2022-01-18 DIAGNOSIS — I5032 Chronic diastolic (congestive) heart failure: Secondary | ICD-10-CM | POA: Diagnosis not present

## 2022-01-20 DIAGNOSIS — N183 Chronic kidney disease, stage 3 unspecified: Secondary | ICD-10-CM | POA: Diagnosis not present

## 2022-01-20 DIAGNOSIS — S81801D Unspecified open wound, right lower leg, subsequent encounter: Secondary | ICD-10-CM | POA: Diagnosis not present

## 2022-01-20 DIAGNOSIS — I110 Hypertensive heart disease with heart failure: Secondary | ICD-10-CM | POA: Diagnosis not present

## 2022-01-20 DIAGNOSIS — E11622 Type 2 diabetes mellitus with other skin ulcer: Secondary | ICD-10-CM | POA: Diagnosis not present

## 2022-01-20 DIAGNOSIS — I5032 Chronic diastolic (congestive) heart failure: Secondary | ICD-10-CM | POA: Diagnosis not present

## 2022-01-20 DIAGNOSIS — D235 Other benign neoplasm of skin of trunk: Secondary | ICD-10-CM | POA: Diagnosis not present

## 2022-01-20 DIAGNOSIS — Z794 Long term (current) use of insulin: Secondary | ICD-10-CM | POA: Diagnosis not present

## 2022-01-20 DIAGNOSIS — N390 Urinary tract infection, site not specified: Secondary | ICD-10-CM | POA: Diagnosis not present

## 2022-01-20 DIAGNOSIS — L14 Bullous disorders in diseases classified elsewhere: Secondary | ICD-10-CM | POA: Diagnosis not present

## 2022-01-21 DIAGNOSIS — I10 Essential (primary) hypertension: Secondary | ICD-10-CM | POA: Diagnosis not present

## 2022-01-22 DIAGNOSIS — K219 Gastro-esophageal reflux disease without esophagitis: Secondary | ICD-10-CM | POA: Diagnosis not present

## 2022-01-22 DIAGNOSIS — Z0000 Encounter for general adult medical examination without abnormal findings: Secondary | ICD-10-CM | POA: Diagnosis not present

## 2022-01-22 DIAGNOSIS — N183 Chronic kidney disease, stage 3 unspecified: Secondary | ICD-10-CM | POA: Diagnosis not present

## 2022-01-22 DIAGNOSIS — I10 Essential (primary) hypertension: Secondary | ICD-10-CM | POA: Diagnosis not present

## 2022-01-22 DIAGNOSIS — I5032 Chronic diastolic (congestive) heart failure: Secondary | ICD-10-CM | POA: Diagnosis not present

## 2022-01-22 DIAGNOSIS — J45909 Unspecified asthma, uncomplicated: Secondary | ICD-10-CM | POA: Diagnosis not present

## 2022-01-22 DIAGNOSIS — I480 Paroxysmal atrial fibrillation: Secondary | ICD-10-CM | POA: Diagnosis not present

## 2022-01-24 DIAGNOSIS — E1165 Type 2 diabetes mellitus with hyperglycemia: Secondary | ICD-10-CM | POA: Diagnosis not present

## 2022-01-25 DIAGNOSIS — L14 Bullous disorders in diseases classified elsewhere: Secondary | ICD-10-CM | POA: Diagnosis not present

## 2022-01-25 DIAGNOSIS — I5032 Chronic diastolic (congestive) heart failure: Secondary | ICD-10-CM | POA: Diagnosis not present

## 2022-01-25 DIAGNOSIS — I110 Hypertensive heart disease with heart failure: Secondary | ICD-10-CM | POA: Diagnosis not present

## 2022-01-25 DIAGNOSIS — N183 Chronic kidney disease, stage 3 unspecified: Secondary | ICD-10-CM | POA: Diagnosis not present

## 2022-01-25 DIAGNOSIS — N390 Urinary tract infection, site not specified: Secondary | ICD-10-CM | POA: Diagnosis not present

## 2022-01-25 DIAGNOSIS — Z794 Long term (current) use of insulin: Secondary | ICD-10-CM | POA: Diagnosis not present

## 2022-01-25 DIAGNOSIS — E11622 Type 2 diabetes mellitus with other skin ulcer: Secondary | ICD-10-CM | POA: Diagnosis not present

## 2022-01-25 DIAGNOSIS — D235 Other benign neoplasm of skin of trunk: Secondary | ICD-10-CM | POA: Diagnosis not present

## 2022-01-25 DIAGNOSIS — S81801D Unspecified open wound, right lower leg, subsequent encounter: Secondary | ICD-10-CM | POA: Diagnosis not present

## 2022-02-02 DIAGNOSIS — L14 Bullous disorders in diseases classified elsewhere: Secondary | ICD-10-CM | POA: Diagnosis not present

## 2022-02-02 DIAGNOSIS — Z794 Long term (current) use of insulin: Secondary | ICD-10-CM | POA: Diagnosis not present

## 2022-02-02 DIAGNOSIS — S81801D Unspecified open wound, right lower leg, subsequent encounter: Secondary | ICD-10-CM | POA: Diagnosis not present

## 2022-02-02 DIAGNOSIS — I5032 Chronic diastolic (congestive) heart failure: Secondary | ICD-10-CM | POA: Diagnosis not present

## 2022-02-02 DIAGNOSIS — I110 Hypertensive heart disease with heart failure: Secondary | ICD-10-CM | POA: Diagnosis not present

## 2022-02-02 DIAGNOSIS — N183 Chronic kidney disease, stage 3 unspecified: Secondary | ICD-10-CM | POA: Diagnosis not present

## 2022-02-02 DIAGNOSIS — N390 Urinary tract infection, site not specified: Secondary | ICD-10-CM | POA: Diagnosis not present

## 2022-02-02 DIAGNOSIS — E11622 Type 2 diabetes mellitus with other skin ulcer: Secondary | ICD-10-CM | POA: Diagnosis not present

## 2022-02-02 DIAGNOSIS — D235 Other benign neoplasm of skin of trunk: Secondary | ICD-10-CM | POA: Diagnosis not present

## 2022-02-03 DIAGNOSIS — J45909 Unspecified asthma, uncomplicated: Secondary | ICD-10-CM | POA: Diagnosis not present

## 2022-02-03 DIAGNOSIS — N183 Chronic kidney disease, stage 3 unspecified: Secondary | ICD-10-CM | POA: Diagnosis not present

## 2022-02-03 DIAGNOSIS — E559 Vitamin D deficiency, unspecified: Secondary | ICD-10-CM | POA: Diagnosis not present

## 2022-02-03 DIAGNOSIS — E038 Other specified hypothyroidism: Secondary | ICD-10-CM | POA: Diagnosis not present

## 2022-02-03 DIAGNOSIS — D518 Other vitamin B12 deficiency anemias: Secondary | ICD-10-CM | POA: Diagnosis not present

## 2022-02-03 DIAGNOSIS — E119 Type 2 diabetes mellitus without complications: Secondary | ICD-10-CM | POA: Diagnosis not present

## 2022-02-03 DIAGNOSIS — E1165 Type 2 diabetes mellitus with hyperglycemia: Secondary | ICD-10-CM | POA: Diagnosis not present

## 2022-02-03 DIAGNOSIS — I10 Essential (primary) hypertension: Secondary | ICD-10-CM | POA: Diagnosis not present

## 2022-02-03 DIAGNOSIS — E782 Mixed hyperlipidemia: Secondary | ICD-10-CM | POA: Diagnosis not present

## 2022-02-03 DIAGNOSIS — E1140 Type 2 diabetes mellitus with diabetic neuropathy, unspecified: Secondary | ICD-10-CM | POA: Diagnosis not present

## 2022-02-06 DIAGNOSIS — N390 Urinary tract infection, site not specified: Secondary | ICD-10-CM | POA: Diagnosis not present

## 2022-02-06 DIAGNOSIS — S81801D Unspecified open wound, right lower leg, subsequent encounter: Secondary | ICD-10-CM | POA: Diagnosis not present

## 2022-02-06 DIAGNOSIS — I110 Hypertensive heart disease with heart failure: Secondary | ICD-10-CM | POA: Diagnosis not present

## 2022-02-06 DIAGNOSIS — L14 Bullous disorders in diseases classified elsewhere: Secondary | ICD-10-CM | POA: Diagnosis not present

## 2022-02-06 DIAGNOSIS — N183 Chronic kidney disease, stage 3 unspecified: Secondary | ICD-10-CM | POA: Diagnosis not present

## 2022-02-06 DIAGNOSIS — E11622 Type 2 diabetes mellitus with other skin ulcer: Secondary | ICD-10-CM | POA: Diagnosis not present

## 2022-02-06 DIAGNOSIS — D235 Other benign neoplasm of skin of trunk: Secondary | ICD-10-CM | POA: Diagnosis not present

## 2022-02-06 DIAGNOSIS — I5032 Chronic diastolic (congestive) heart failure: Secondary | ICD-10-CM | POA: Diagnosis not present

## 2022-02-06 DIAGNOSIS — Z794 Long term (current) use of insulin: Secondary | ICD-10-CM | POA: Diagnosis not present

## 2022-02-07 DIAGNOSIS — R8290 Unspecified abnormal findings in urine: Secondary | ICD-10-CM | POA: Diagnosis not present

## 2022-02-07 DIAGNOSIS — R296 Repeated falls: Secondary | ICD-10-CM | POA: Diagnosis not present

## 2022-02-08 DIAGNOSIS — R296 Repeated falls: Secondary | ICD-10-CM | POA: Diagnosis not present

## 2022-02-11 DIAGNOSIS — N390 Urinary tract infection, site not specified: Secondary | ICD-10-CM | POA: Diagnosis not present

## 2022-02-15 DIAGNOSIS — E1165 Type 2 diabetes mellitus with hyperglycemia: Secondary | ICD-10-CM | POA: Diagnosis not present

## 2022-02-15 DIAGNOSIS — J45909 Unspecified asthma, uncomplicated: Secondary | ICD-10-CM | POA: Diagnosis not present

## 2022-02-15 DIAGNOSIS — I10 Essential (primary) hypertension: Secondary | ICD-10-CM | POA: Diagnosis not present

## 2022-02-15 DIAGNOSIS — I5032 Chronic diastolic (congestive) heart failure: Secondary | ICD-10-CM | POA: Diagnosis not present

## 2022-02-15 DIAGNOSIS — I480 Paroxysmal atrial fibrillation: Secondary | ICD-10-CM | POA: Diagnosis not present

## 2022-02-15 DIAGNOSIS — N183 Chronic kidney disease, stage 3 unspecified: Secondary | ICD-10-CM | POA: Diagnosis not present

## 2022-02-15 DIAGNOSIS — D508 Other iron deficiency anemias: Secondary | ICD-10-CM | POA: Diagnosis not present

## 2022-02-15 DIAGNOSIS — E1140 Type 2 diabetes mellitus with diabetic neuropathy, unspecified: Secondary | ICD-10-CM | POA: Diagnosis not present

## 2022-02-16 DIAGNOSIS — S81801D Unspecified open wound, right lower leg, subsequent encounter: Secondary | ICD-10-CM | POA: Diagnosis not present

## 2022-02-16 DIAGNOSIS — L14 Bullous disorders in diseases classified elsewhere: Secondary | ICD-10-CM | POA: Diagnosis not present

## 2022-02-16 DIAGNOSIS — I5032 Chronic diastolic (congestive) heart failure: Secondary | ICD-10-CM | POA: Diagnosis not present

## 2022-02-16 DIAGNOSIS — N390 Urinary tract infection, site not specified: Secondary | ICD-10-CM | POA: Diagnosis not present

## 2022-02-16 DIAGNOSIS — I110 Hypertensive heart disease with heart failure: Secondary | ICD-10-CM | POA: Diagnosis not present

## 2022-02-16 DIAGNOSIS — E11622 Type 2 diabetes mellitus with other skin ulcer: Secondary | ICD-10-CM | POA: Diagnosis not present

## 2022-02-16 DIAGNOSIS — Z794 Long term (current) use of insulin: Secondary | ICD-10-CM | POA: Diagnosis not present

## 2022-02-16 DIAGNOSIS — D235 Other benign neoplasm of skin of trunk: Secondary | ICD-10-CM | POA: Diagnosis not present

## 2022-02-16 DIAGNOSIS — N183 Chronic kidney disease, stage 3 unspecified: Secondary | ICD-10-CM | POA: Diagnosis not present

## 2022-02-20 DIAGNOSIS — R011 Cardiac murmur, unspecified: Secondary | ICD-10-CM | POA: Diagnosis not present

## 2022-02-20 IMAGING — RF DG C-ARM 1-60 MIN
1 series · 8 of 8 positions shown · non-contrast
Comparison: MRI RIGHT foot, 08/30/2021.

CLINICAL DATA: Bypass graft.

EXAM:
DG C-ARM 1-60 MIN

[Series 1: run · 2 acquisitions, 8 frames shown]
[im 1/2]
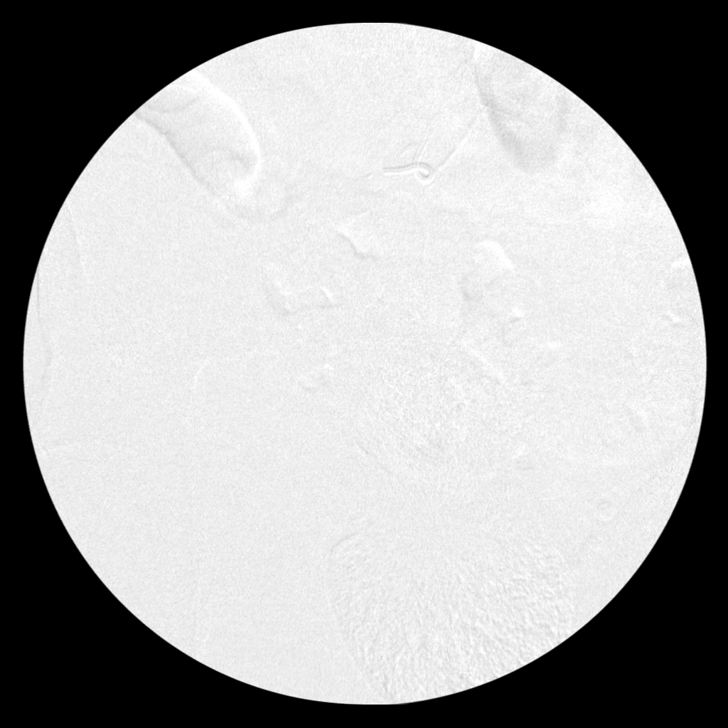
[im 1/2]
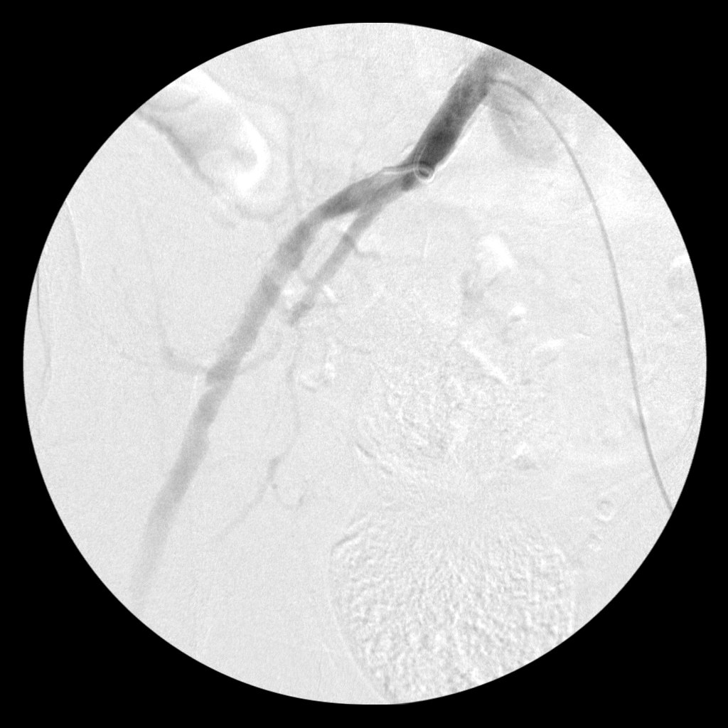
[im 1/2]
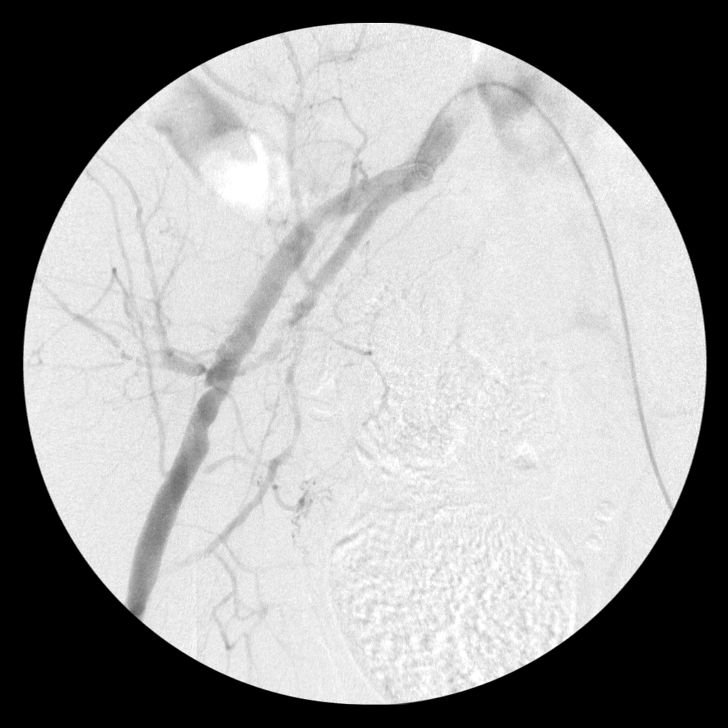
[im 1/2]
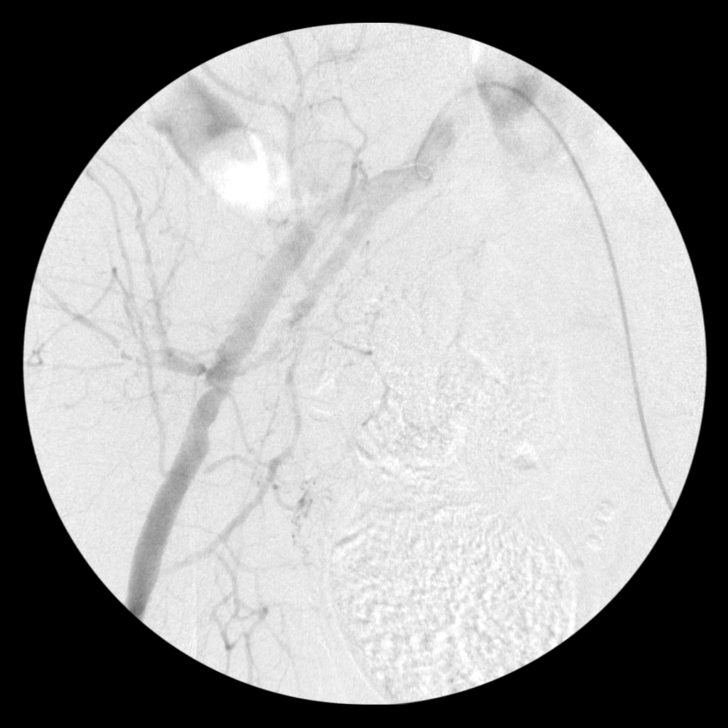
[im 2/2]
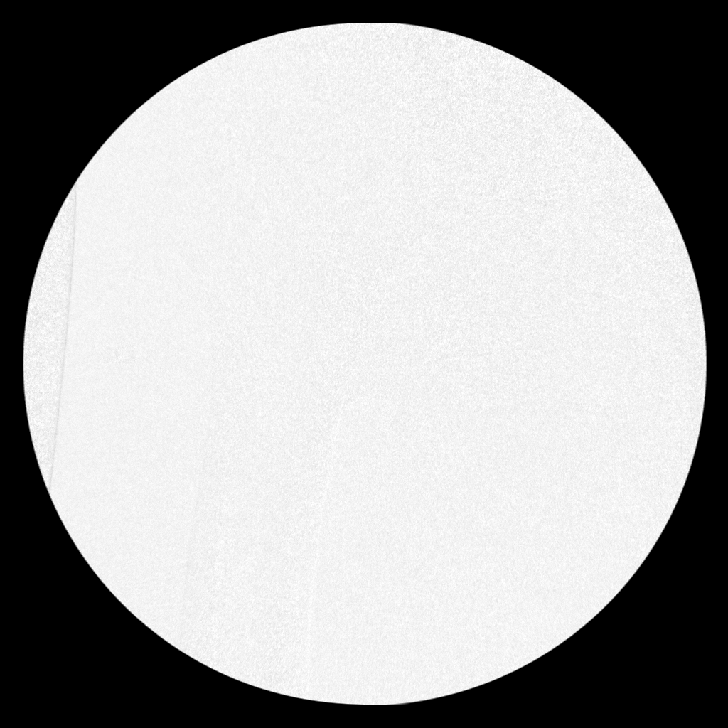
[im 2/2]
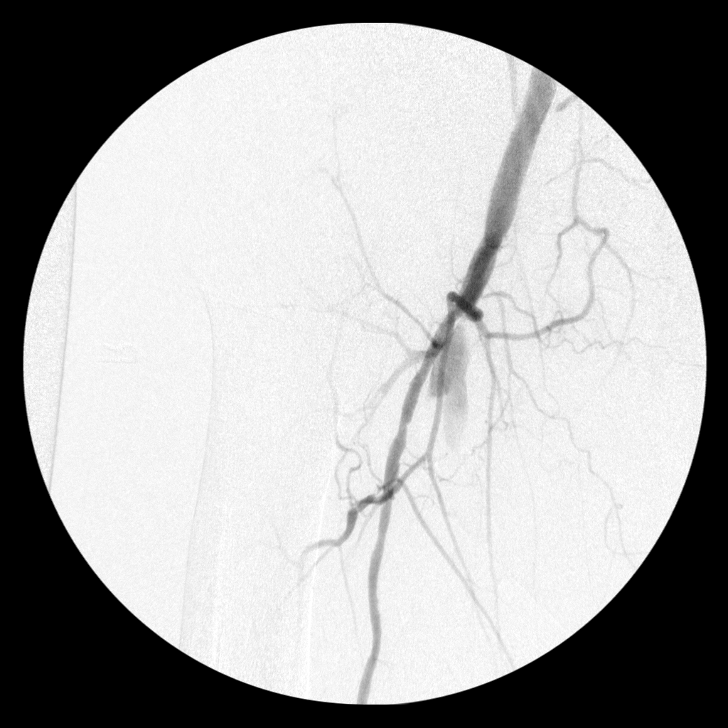
[im 2/2]
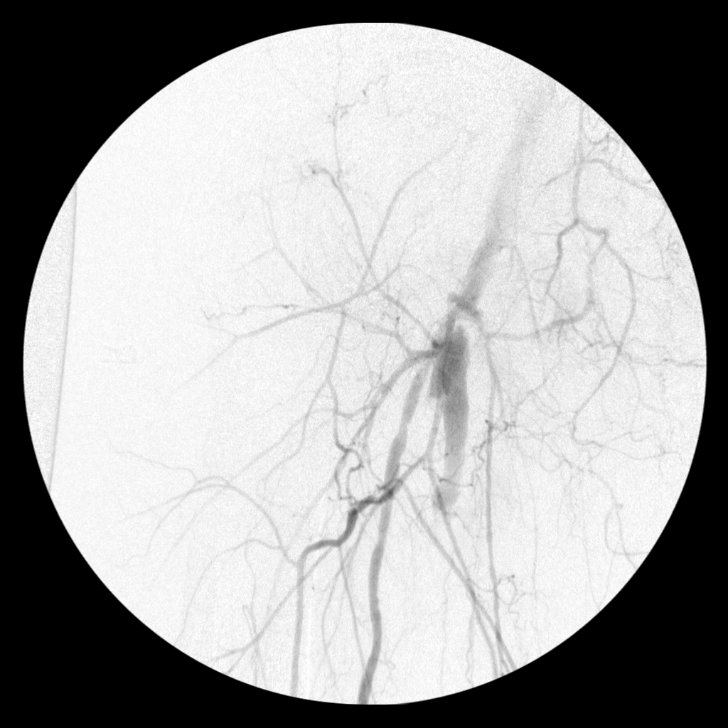
[im 2/2]
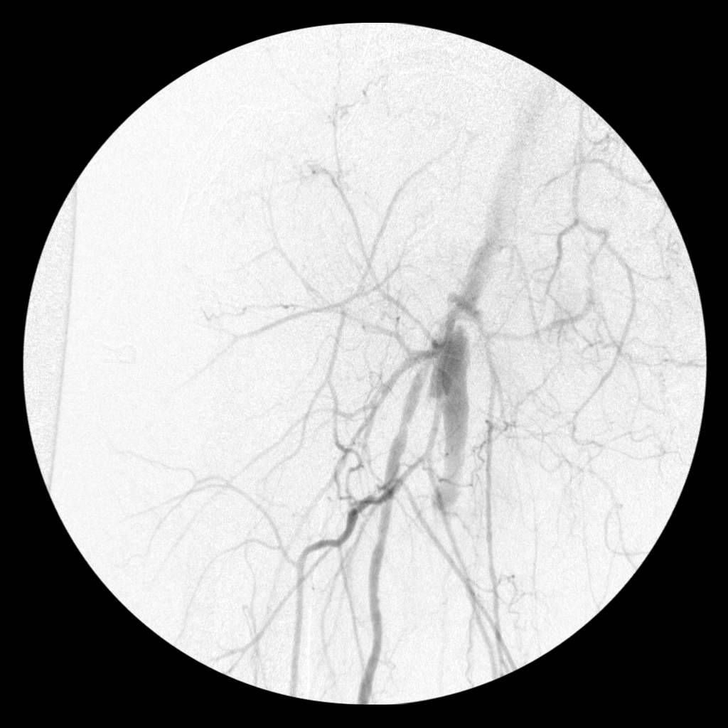

[8 of 8 positions shown; findings below may reference images not displayed]

FLUOROSCOPY TIME:  Fluoroscopy Time:  1 minute, 23 seconds.

Radiation Exposure Index (if provided by the fluoroscopic device):
29.1

Number of Acquired Spot Images: 2 fluoroscopic cine loops, each with
69 on 98 images respectively.
FINDINGS: Multiple, limited oblique digitally subtracted views of the RIGHT
pelvis obtained by C-arm, in 2 fluoroscopic cine loops.

Images demonstrating LEFT femoral cannulation, intravascular
catheter extending up and over the aortic bifurcation into the RIGHT
common iliac artery then digital subtracted views of the RIGHT iliac
and femoral vessels.

Multifocal atherosclerosis along the RIGHT external iliac artery
without hemodynamically significant stenosis.

Opacification of the proximal RIGHT femoral-to-popliteal venous
graft without evidence of extravasation at the proximal anastomosis,
and filling of the proximal venous segment.
IMPRESSION: Fluoroscopic imaging for RIGHT pelvis and proximal lower extremity
angiogram, and femoral-to-popliteal venous graft.

No radiographic evidence of acute complication within the images
provided.

For complete description of intra procedural findings, please see
performing service dictation.

## 2022-02-21 DIAGNOSIS — L14 Bullous disorders in diseases classified elsewhere: Secondary | ICD-10-CM | POA: Diagnosis not present

## 2022-02-21 DIAGNOSIS — I10 Essential (primary) hypertension: Secondary | ICD-10-CM | POA: Diagnosis not present

## 2022-02-21 DIAGNOSIS — I110 Hypertensive heart disease with heart failure: Secondary | ICD-10-CM | POA: Diagnosis not present

## 2022-02-21 DIAGNOSIS — Z794 Long term (current) use of insulin: Secondary | ICD-10-CM | POA: Diagnosis not present

## 2022-02-21 DIAGNOSIS — S81801D Unspecified open wound, right lower leg, subsequent encounter: Secondary | ICD-10-CM | POA: Diagnosis not present

## 2022-02-21 DIAGNOSIS — N183 Chronic kidney disease, stage 3 unspecified: Secondary | ICD-10-CM | POA: Diagnosis not present

## 2022-02-21 DIAGNOSIS — D235 Other benign neoplasm of skin of trunk: Secondary | ICD-10-CM | POA: Diagnosis not present

## 2022-02-21 DIAGNOSIS — N390 Urinary tract infection, site not specified: Secondary | ICD-10-CM | POA: Diagnosis not present

## 2022-02-21 DIAGNOSIS — E11622 Type 2 diabetes mellitus with other skin ulcer: Secondary | ICD-10-CM | POA: Diagnosis not present

## 2022-02-21 DIAGNOSIS — I5032 Chronic diastolic (congestive) heart failure: Secondary | ICD-10-CM | POA: Diagnosis not present

## 2022-02-23 DIAGNOSIS — I5032 Chronic diastolic (congestive) heart failure: Secondary | ICD-10-CM | POA: Diagnosis not present

## 2022-02-23 DIAGNOSIS — L14 Bullous disorders in diseases classified elsewhere: Secondary | ICD-10-CM | POA: Diagnosis not present

## 2022-02-23 DIAGNOSIS — I110 Hypertensive heart disease with heart failure: Secondary | ICD-10-CM | POA: Diagnosis not present

## 2022-02-23 DIAGNOSIS — Z794 Long term (current) use of insulin: Secondary | ICD-10-CM | POA: Diagnosis not present

## 2022-02-23 DIAGNOSIS — N183 Chronic kidney disease, stage 3 unspecified: Secondary | ICD-10-CM | POA: Diagnosis not present

## 2022-02-23 DIAGNOSIS — E11622 Type 2 diabetes mellitus with other skin ulcer: Secondary | ICD-10-CM | POA: Diagnosis not present

## 2022-02-23 DIAGNOSIS — N390 Urinary tract infection, site not specified: Secondary | ICD-10-CM | POA: Diagnosis not present

## 2022-02-23 DIAGNOSIS — S81801D Unspecified open wound, right lower leg, subsequent encounter: Secondary | ICD-10-CM | POA: Diagnosis not present

## 2022-02-23 DIAGNOSIS — D235 Other benign neoplasm of skin of trunk: Secondary | ICD-10-CM | POA: Diagnosis not present

## 2022-02-24 ENCOUNTER — Ambulatory Visit: Payer: Medicare Other | Admitting: Cardiology

## 2022-03-01 DIAGNOSIS — E11622 Type 2 diabetes mellitus with other skin ulcer: Secondary | ICD-10-CM | POA: Diagnosis not present

## 2022-03-01 DIAGNOSIS — I110 Hypertensive heart disease with heart failure: Secondary | ICD-10-CM | POA: Diagnosis not present

## 2022-03-01 DIAGNOSIS — S81801D Unspecified open wound, right lower leg, subsequent encounter: Secondary | ICD-10-CM | POA: Diagnosis not present

## 2022-03-01 DIAGNOSIS — I5032 Chronic diastolic (congestive) heart failure: Secondary | ICD-10-CM | POA: Diagnosis not present

## 2022-03-01 DIAGNOSIS — Z794 Long term (current) use of insulin: Secondary | ICD-10-CM | POA: Diagnosis not present

## 2022-03-01 DIAGNOSIS — N183 Chronic kidney disease, stage 3 unspecified: Secondary | ICD-10-CM | POA: Diagnosis not present

## 2022-03-01 DIAGNOSIS — N390 Urinary tract infection, site not specified: Secondary | ICD-10-CM | POA: Diagnosis not present

## 2022-03-01 DIAGNOSIS — L14 Bullous disorders in diseases classified elsewhere: Secondary | ICD-10-CM | POA: Diagnosis not present

## 2022-03-01 DIAGNOSIS — D235 Other benign neoplasm of skin of trunk: Secondary | ICD-10-CM | POA: Diagnosis not present

## 2022-03-03 DIAGNOSIS — S81801D Unspecified open wound, right lower leg, subsequent encounter: Secondary | ICD-10-CM | POA: Diagnosis not present

## 2022-03-03 DIAGNOSIS — I110 Hypertensive heart disease with heart failure: Secondary | ICD-10-CM | POA: Diagnosis not present

## 2022-03-03 DIAGNOSIS — N390 Urinary tract infection, site not specified: Secondary | ICD-10-CM | POA: Diagnosis not present

## 2022-03-03 DIAGNOSIS — Z794 Long term (current) use of insulin: Secondary | ICD-10-CM | POA: Diagnosis not present

## 2022-03-03 DIAGNOSIS — E11622 Type 2 diabetes mellitus with other skin ulcer: Secondary | ICD-10-CM | POA: Diagnosis not present

## 2022-03-03 DIAGNOSIS — N183 Chronic kidney disease, stage 3 unspecified: Secondary | ICD-10-CM | POA: Diagnosis not present

## 2022-03-03 DIAGNOSIS — D235 Other benign neoplasm of skin of trunk: Secondary | ICD-10-CM | POA: Diagnosis not present

## 2022-03-03 DIAGNOSIS — L14 Bullous disorders in diseases classified elsewhere: Secondary | ICD-10-CM | POA: Diagnosis not present

## 2022-03-03 DIAGNOSIS — I5032 Chronic diastolic (congestive) heart failure: Secondary | ICD-10-CM | POA: Diagnosis not present

## 2022-03-06 DIAGNOSIS — I10 Essential (primary) hypertension: Secondary | ICD-10-CM | POA: Diagnosis not present

## 2022-03-06 DIAGNOSIS — N183 Chronic kidney disease, stage 3 unspecified: Secondary | ICD-10-CM | POA: Diagnosis not present

## 2022-03-06 DIAGNOSIS — J45909 Unspecified asthma, uncomplicated: Secondary | ICD-10-CM | POA: Diagnosis not present

## 2022-03-06 DIAGNOSIS — D518 Other vitamin B12 deficiency anemias: Secondary | ICD-10-CM | POA: Diagnosis not present

## 2022-03-06 DIAGNOSIS — E559 Vitamin D deficiency, unspecified: Secondary | ICD-10-CM | POA: Diagnosis not present

## 2022-03-06 DIAGNOSIS — E038 Other specified hypothyroidism: Secondary | ICD-10-CM | POA: Diagnosis not present

## 2022-03-06 DIAGNOSIS — E119 Type 2 diabetes mellitus without complications: Secondary | ICD-10-CM | POA: Diagnosis not present

## 2022-03-08 DIAGNOSIS — E11622 Type 2 diabetes mellitus with other skin ulcer: Secondary | ICD-10-CM | POA: Diagnosis not present

## 2022-03-08 DIAGNOSIS — I5032 Chronic diastolic (congestive) heart failure: Secondary | ICD-10-CM | POA: Diagnosis not present

## 2022-03-08 DIAGNOSIS — D235 Other benign neoplasm of skin of trunk: Secondary | ICD-10-CM | POA: Diagnosis not present

## 2022-03-08 DIAGNOSIS — N183 Chronic kidney disease, stage 3 unspecified: Secondary | ICD-10-CM | POA: Diagnosis not present

## 2022-03-08 DIAGNOSIS — I110 Hypertensive heart disease with heart failure: Secondary | ICD-10-CM | POA: Diagnosis not present

## 2022-03-08 DIAGNOSIS — S81801D Unspecified open wound, right lower leg, subsequent encounter: Secondary | ICD-10-CM | POA: Diagnosis not present

## 2022-03-08 DIAGNOSIS — N390 Urinary tract infection, site not specified: Secondary | ICD-10-CM | POA: Diagnosis not present

## 2022-03-08 DIAGNOSIS — L14 Bullous disorders in diseases classified elsewhere: Secondary | ICD-10-CM | POA: Diagnosis not present

## 2022-03-08 DIAGNOSIS — Z794 Long term (current) use of insulin: Secondary | ICD-10-CM | POA: Diagnosis not present

## 2022-03-09 DIAGNOSIS — D235 Other benign neoplasm of skin of trunk: Secondary | ICD-10-CM | POA: Diagnosis not present

## 2022-03-09 DIAGNOSIS — I5032 Chronic diastolic (congestive) heart failure: Secondary | ICD-10-CM | POA: Diagnosis not present

## 2022-03-09 DIAGNOSIS — I110 Hypertensive heart disease with heart failure: Secondary | ICD-10-CM | POA: Diagnosis not present

## 2022-03-09 DIAGNOSIS — E11622 Type 2 diabetes mellitus with other skin ulcer: Secondary | ICD-10-CM | POA: Diagnosis not present

## 2022-03-09 DIAGNOSIS — N390 Urinary tract infection, site not specified: Secondary | ICD-10-CM | POA: Diagnosis not present

## 2022-03-09 DIAGNOSIS — S81801D Unspecified open wound, right lower leg, subsequent encounter: Secondary | ICD-10-CM | POA: Diagnosis not present

## 2022-03-09 DIAGNOSIS — L14 Bullous disorders in diseases classified elsewhere: Secondary | ICD-10-CM | POA: Diagnosis not present

## 2022-03-09 DIAGNOSIS — Z794 Long term (current) use of insulin: Secondary | ICD-10-CM | POA: Diagnosis not present

## 2022-03-09 DIAGNOSIS — N183 Chronic kidney disease, stage 3 unspecified: Secondary | ICD-10-CM | POA: Diagnosis not present

## 2022-03-13 DIAGNOSIS — R0989 Other specified symptoms and signs involving the circulatory and respiratory systems: Secondary | ICD-10-CM | POA: Diagnosis not present

## 2022-03-13 DIAGNOSIS — I6523 Occlusion and stenosis of bilateral carotid arteries: Secondary | ICD-10-CM | POA: Diagnosis not present

## 2022-03-14 DIAGNOSIS — R296 Repeated falls: Secondary | ICD-10-CM | POA: Diagnosis not present

## 2022-03-15 DIAGNOSIS — I10 Essential (primary) hypertension: Secondary | ICD-10-CM | POA: Diagnosis not present

## 2022-03-15 DIAGNOSIS — I5032 Chronic diastolic (congestive) heart failure: Secondary | ICD-10-CM | POA: Diagnosis not present

## 2022-03-15 DIAGNOSIS — E11622 Type 2 diabetes mellitus with other skin ulcer: Secondary | ICD-10-CM | POA: Diagnosis not present

## 2022-03-15 DIAGNOSIS — Z794 Long term (current) use of insulin: Secondary | ICD-10-CM | POA: Diagnosis not present

## 2022-03-15 DIAGNOSIS — N183 Chronic kidney disease, stage 3 unspecified: Secondary | ICD-10-CM | POA: Diagnosis not present

## 2022-03-15 DIAGNOSIS — I480 Paroxysmal atrial fibrillation: Secondary | ICD-10-CM | POA: Diagnosis not present

## 2022-03-15 DIAGNOSIS — E785 Hyperlipidemia, unspecified: Secondary | ICD-10-CM | POA: Diagnosis not present

## 2022-03-15 DIAGNOSIS — L14 Bullous disorders in diseases classified elsewhere: Secondary | ICD-10-CM | POA: Diagnosis not present

## 2022-03-15 DIAGNOSIS — E11628 Type 2 diabetes mellitus with other skin complications: Secondary | ICD-10-CM | POA: Diagnosis not present

## 2022-03-15 DIAGNOSIS — S91101D Unspecified open wound of right great toe without damage to nail, subsequent encounter: Secondary | ICD-10-CM | POA: Diagnosis not present

## 2022-03-15 DIAGNOSIS — I110 Hypertensive heart disease with heart failure: Secondary | ICD-10-CM | POA: Diagnosis not present

## 2022-03-15 DIAGNOSIS — E1165 Type 2 diabetes mellitus with hyperglycemia: Secondary | ICD-10-CM | POA: Diagnosis not present

## 2022-03-15 DIAGNOSIS — K219 Gastro-esophageal reflux disease without esophagitis: Secondary | ICD-10-CM | POA: Diagnosis not present

## 2022-03-15 DIAGNOSIS — D235 Other benign neoplasm of skin of trunk: Secondary | ICD-10-CM | POA: Diagnosis not present

## 2022-03-17 DIAGNOSIS — Z794 Long term (current) use of insulin: Secondary | ICD-10-CM | POA: Diagnosis not present

## 2022-03-17 DIAGNOSIS — N183 Chronic kidney disease, stage 3 unspecified: Secondary | ICD-10-CM | POA: Diagnosis not present

## 2022-03-17 DIAGNOSIS — E11622 Type 2 diabetes mellitus with other skin ulcer: Secondary | ICD-10-CM | POA: Diagnosis not present

## 2022-03-17 DIAGNOSIS — S91101D Unspecified open wound of right great toe without damage to nail, subsequent encounter: Secondary | ICD-10-CM | POA: Diagnosis not present

## 2022-03-17 DIAGNOSIS — L14 Bullous disorders in diseases classified elsewhere: Secondary | ICD-10-CM | POA: Diagnosis not present

## 2022-03-17 DIAGNOSIS — D235 Other benign neoplasm of skin of trunk: Secondary | ICD-10-CM | POA: Diagnosis not present

## 2022-03-17 DIAGNOSIS — I5032 Chronic diastolic (congestive) heart failure: Secondary | ICD-10-CM | POA: Diagnosis not present

## 2022-03-17 DIAGNOSIS — I480 Paroxysmal atrial fibrillation: Secondary | ICD-10-CM | POA: Diagnosis not present

## 2022-03-17 DIAGNOSIS — I110 Hypertensive heart disease with heart failure: Secondary | ICD-10-CM | POA: Diagnosis not present

## 2022-03-21 DIAGNOSIS — D235 Other benign neoplasm of skin of trunk: Secondary | ICD-10-CM | POA: Diagnosis not present

## 2022-03-21 DIAGNOSIS — I110 Hypertensive heart disease with heart failure: Secondary | ICD-10-CM | POA: Diagnosis not present

## 2022-03-21 DIAGNOSIS — N183 Chronic kidney disease, stage 3 unspecified: Secondary | ICD-10-CM | POA: Diagnosis not present

## 2022-03-21 DIAGNOSIS — S91101D Unspecified open wound of right great toe without damage to nail, subsequent encounter: Secondary | ICD-10-CM | POA: Diagnosis not present

## 2022-03-21 DIAGNOSIS — I480 Paroxysmal atrial fibrillation: Secondary | ICD-10-CM | POA: Diagnosis not present

## 2022-03-21 DIAGNOSIS — E11622 Type 2 diabetes mellitus with other skin ulcer: Secondary | ICD-10-CM | POA: Diagnosis not present

## 2022-03-21 DIAGNOSIS — Z794 Long term (current) use of insulin: Secondary | ICD-10-CM | POA: Diagnosis not present

## 2022-03-21 DIAGNOSIS — L14 Bullous disorders in diseases classified elsewhere: Secondary | ICD-10-CM | POA: Diagnosis not present

## 2022-03-21 DIAGNOSIS — I5032 Chronic diastolic (congestive) heart failure: Secondary | ICD-10-CM | POA: Diagnosis not present

## 2022-03-23 DIAGNOSIS — I480 Paroxysmal atrial fibrillation: Secondary | ICD-10-CM | POA: Diagnosis not present

## 2022-03-23 DIAGNOSIS — I5032 Chronic diastolic (congestive) heart failure: Secondary | ICD-10-CM | POA: Diagnosis not present

## 2022-03-23 DIAGNOSIS — D235 Other benign neoplasm of skin of trunk: Secondary | ICD-10-CM | POA: Diagnosis not present

## 2022-03-23 DIAGNOSIS — E11622 Type 2 diabetes mellitus with other skin ulcer: Secondary | ICD-10-CM | POA: Diagnosis not present

## 2022-03-23 DIAGNOSIS — I70223 Atherosclerosis of native arteries of extremities with rest pain, bilateral legs: Secondary | ICD-10-CM | POA: Diagnosis not present

## 2022-03-23 DIAGNOSIS — I110 Hypertensive heart disease with heart failure: Secondary | ICD-10-CM | POA: Diagnosis not present

## 2022-03-23 DIAGNOSIS — S91101D Unspecified open wound of right great toe without damage to nail, subsequent encounter: Secondary | ICD-10-CM | POA: Diagnosis not present

## 2022-03-23 DIAGNOSIS — L14 Bullous disorders in diseases classified elsewhere: Secondary | ICD-10-CM | POA: Diagnosis not present

## 2022-03-23 DIAGNOSIS — Z794 Long term (current) use of insulin: Secondary | ICD-10-CM | POA: Diagnosis not present

## 2022-03-23 DIAGNOSIS — N183 Chronic kidney disease, stage 3 unspecified: Secondary | ICD-10-CM | POA: Diagnosis not present

## 2022-03-24 DIAGNOSIS — I10 Essential (primary) hypertension: Secondary | ICD-10-CM | POA: Diagnosis not present

## 2022-03-25 DIAGNOSIS — I10 Essential (primary) hypertension: Secondary | ICD-10-CM | POA: Diagnosis not present

## 2022-03-27 DIAGNOSIS — M79671 Pain in right foot: Secondary | ICD-10-CM | POA: Diagnosis not present

## 2022-03-27 DIAGNOSIS — M2011 Hallux valgus (acquired), right foot: Secondary | ICD-10-CM | POA: Diagnosis not present

## 2022-03-27 DIAGNOSIS — S88112A Complete traumatic amputation at level between knee and ankle, left lower leg, initial encounter: Secondary | ICD-10-CM | POA: Diagnosis not present

## 2022-03-27 DIAGNOSIS — M2041 Other hammer toe(s) (acquired), right foot: Secondary | ICD-10-CM | POA: Diagnosis not present

## 2022-03-27 DIAGNOSIS — E1140 Type 2 diabetes mellitus with diabetic neuropathy, unspecified: Secondary | ICD-10-CM | POA: Diagnosis not present

## 2022-03-27 DIAGNOSIS — B351 Tinea unguium: Secondary | ICD-10-CM | POA: Diagnosis not present

## 2022-03-27 DIAGNOSIS — L600 Ingrowing nail: Secondary | ICD-10-CM | POA: Diagnosis not present

## 2022-03-28 ENCOUNTER — Encounter: Payer: Self-pay | Admitting: Cardiology

## 2022-03-28 ENCOUNTER — Ambulatory Visit (INDEPENDENT_AMBULATORY_CARE_PROVIDER_SITE_OTHER): Payer: Medicare Other | Admitting: Cardiology

## 2022-03-28 VITALS — BP 152/80 | HR 65 | Ht 73.0 in | Wt 207.0 lb

## 2022-03-28 DIAGNOSIS — N183 Chronic kidney disease, stage 3 unspecified: Secondary | ICD-10-CM | POA: Diagnosis not present

## 2022-03-28 DIAGNOSIS — L14 Bullous disorders in diseases classified elsewhere: Secondary | ICD-10-CM | POA: Diagnosis not present

## 2022-03-28 DIAGNOSIS — N1831 Chronic kidney disease, stage 3a: Secondary | ICD-10-CM

## 2022-03-28 DIAGNOSIS — D235 Other benign neoplasm of skin of trunk: Secondary | ICD-10-CM | POA: Diagnosis not present

## 2022-03-28 DIAGNOSIS — Z794 Long term (current) use of insulin: Secondary | ICD-10-CM | POA: Diagnosis not present

## 2022-03-28 DIAGNOSIS — E782 Mixed hyperlipidemia: Secondary | ICD-10-CM | POA: Diagnosis not present

## 2022-03-28 DIAGNOSIS — I480 Paroxysmal atrial fibrillation: Secondary | ICD-10-CM

## 2022-03-28 DIAGNOSIS — I25709 Atherosclerosis of coronary artery bypass graft(s), unspecified, with unspecified angina pectoris: Secondary | ICD-10-CM

## 2022-03-28 DIAGNOSIS — I5032 Chronic diastolic (congestive) heart failure: Secondary | ICD-10-CM | POA: Diagnosis not present

## 2022-03-28 DIAGNOSIS — I110 Hypertensive heart disease with heart failure: Secondary | ICD-10-CM | POA: Diagnosis not present

## 2022-03-28 DIAGNOSIS — E11622 Type 2 diabetes mellitus with other skin ulcer: Secondary | ICD-10-CM | POA: Diagnosis not present

## 2022-03-28 DIAGNOSIS — S91101D Unspecified open wound of right great toe without damage to nail, subsequent encounter: Secondary | ICD-10-CM | POA: Diagnosis not present

## 2022-03-28 NOTE — Patient Instructions (Signed)
Medication Instructions:  The current medical regimen is effective;  continue present plan and medications.  *If you need a refill on your cardiac medications before your next appointment, please call your pharmacy*  Follow-Up: At [facility], you and your health needs are our priority.  As part of our continuing mission to provide you with exceptional heart care, we have created designated Provider Care Teams.  These Care Teams include your primary Cardiologist (physician) and Advanced Practice Providers (APPs -  Physician Assistants and Nurse Practitioners) who all work together to provide you with the care you need, when you need it.  We recommend signing up for the patient portal called "MyChart".  Sign up information is provided on this After Visit Summary.  MyChart is used to connect with patients for Virtual Visits (Telemedicine).  Patients are able to view lab/test results, encounter notes, upcoming appointments, etc.  Non-urgent messages can be sent to your provider as well.   To learn more about what you can do with MyChart, go to [email or web address].    Your next appointment:   1 year(s)  The format for your next appointment:   In Person  Provider:   Dr Candee Furbish   Important Information About [place]

## 2022-03-28 NOTE — Progress Notes (Signed)
Cardiology Office Note:    Date:  03/28/2022   ID:  Brian Martin, DOB 23-May-1956, MRN [id number]  PCP:  Marco Collie, MD   [facility] Providers Cardiologist:  Candee Furbish, MD     Referring MD: Marco Collie, MD   History of Present Illness:    Brian Martin is a 66 y.o. male here for the follow-up of paroxysmal atrial fibrillation, hypertension, and chronic diastolic heart failure. He also presents for post hospital follow-up of congestive heart failure per Dr. Aline August.  He was admitted to the hospital on 08/30/21. Per notes from Dr. Starla Link, personally reviewed, he is a nursing home resident with cognitive decline, PVD status post left BKA, PAF on Eliquis, chronic diastolic CHF, IDDM, hypertension presented with frequent falls. He had a right foot ulcer which remained persistent since November 2022 despite a course of antibiotic treatment.  On admission, he was started on broad-spectrum antibiotics.  MRI of foot was negative for osteomyelitis.  ABI showed poor flow to the left right ankle.  Vascular surgery and cardiology were consulted.  He underwent right femoral to above-knee popliteal artery bypass graft on 09/07/2021.  Subsequently, he has remained stable.  Vascular surgery cleared the patient for discharge with outpatient follow-up with vascular surgery and also cleared the patient to be resumed back on Eliquis.  He was discharged to SNF.  Today: He is accompanied by a SNF caretaker. She reports that he has ongoing, intermittent blisters on his RLE.  No signs of active infection.  He states he is feeling pretty good with no new concerns today.  Mild memory impairment noted.  He denies any palpitations, chest pain, shortness of breath, or peripheral edema. No lightheadedness, headaches, syncope, orthopnea, or PND.   Past Medical History:  Diagnosis Date   Amputation of leg ([place])    Atrial fibrillation ([place])    CHF (congestive heart failure) ([place])    chronic systolic CHF    Chronic renal failure    Diabetes mellitus    type 2   Hypertension     Past Surgical History:  Procedure Laterality Date   ABDOMINAL AORTOGRAM N/A 09/02/2021   Procedure: ABDOMINAL AORTOGRAM;  Surgeon: Angelia Mould, MD;  Location: [place] [facility];  Service: Cardiovascular;  Laterality: N/A;   BELOW KNEE LEG AMPUTATION  06/29/2010   Left    COLONOSCOPY N/A 06/15/2013   Procedure: COLONOSCOPY;  Surgeon: Lear Ng, MD;  Location: [facility];  Service: Endoscopy;  Laterality: N/A;   ENDARTERECTOMY FEMORAL Right 09/07/2021   Procedure: ENDARTERECTOMY FEMORAL;  Surgeon: Serafina Mitchell, MD;  [facility];  Service: Vascular;  Laterality: Right;   ESOPHAGOGASTRODUODENOSCOPY N/A 06/14/2013   Procedure: ESOPHAGOGASTRODUODENOSCOPY (EGD);  Surgeon: Lear Ng, MD;  Location: [facility] ENDOSCOPY;  Service: Endoscopy;  Laterality: N/A;   FEMORAL-POPLITEAL BYPASS GRAFT Right 09/07/2021   Procedure: BYPASS GRAFT RIGHT FEMORAL-POPLITEAL ARTERY WITH VEIN;  Surgeon: Serafina Mitchell, MD;  Location: [place];  Service: Vascular;  Laterality: Right;   INTRAOPERATIVE ARTERIOGRAM Right 09/07/2021   Procedure: INTRA OPERATIVE ARTERIOGRAM;  Surgeon: Serafina Mitchell, MD;  Location: [place];  Service: Vascular;  Laterality: Right;   LOWER EXTREMITY ANGIOGRAPHY Right 09/02/2021   Procedure: LOWER EXTREMITY ANGIOGRAPHY;  Surgeon: Angelia Mould, MD;  Location: [place] [facility];  Service: Cardiovascular;  Laterality: Right;   PERIPHERAL VASCULAR BALLOON ANGIOPLASTY Right 09/02/2021   Procedure: PERIPHERAL VASCULAR BALLOON ANGIOPLASTY;  Surgeon: Angelia Mould, MD;  Location: [facility] INVASIVE [facility]  LAB;  Service: Cardiovascular;  Laterality: Right;  SFA    UNABLE TO CROSS OCCLUDED LEAION   RIGHT/LEFT HEART CATH AND CORONARY ANGIOGRAPHY N/A 09/05/2021   Procedure: RIGHT/LEFT HEART CATH AND CORONARY ANGIOGRAPHY;  Surgeon: Lorretta Harp, MD;  Location: [place] [facility];  Service:  Cardiovascular;  Laterality: N/A;   ULTRASOUND GUIDANCE FOR VASCULAR ACCESS Left 09/07/2021   Procedure: ULTRASOUND GUIDANCE FOR VASCULAR ACCESS LEFT FEMORAL ARTERY;  Surgeon: Serafina Mitchell, MD;  Location: [facility] OR;  Service: Vascular;  Laterality: Left;    Current Medications: Current Meds  Medication Sig   albuterol (PROVENTIL) (2.5 MG/3ML) 0.083% nebulizer solution Take 2.5 mg by nebulization every 6 (six) hours as needed for wheezing or shortness of breath.   apixaban (ELIQUIS) 5 MG TABS tablet Take 1 tablet (5 mg total) by mouth 2 (two) times daily. NEEDS APPOINTMENT FOR FUTURE REFILLS   budesonide-formoterol (SYMBICORT) 160-4.5 MCG/ACT inhaler Inhale 2 puffs into the lungs 2 (two) times daily.   carvedilol (COREG) 25 MG tablet Take 25 mg by mouth 2 (two) times daily with a meal.   CRANBERRY PLUS VITAMIN C 4200-20-3 MG-UNIT CAPS Take 1 capsule by mouth daily.   divalproex (DEPAKOTE) 125 MG DR tablet Take 3 tablets by mouth daily. 2 tablets at 2pm and 1 tablet at bedtime   donepezil (ARICEPT) 5 MG tablet Take 5 mg by mouth daily.   ferrous sulfate 325 (65 FE) MG tablet Take 325 mg by mouth daily.   furosemide (LASIX) 20 MG tablet Take 1 tablet (20 mg total) by mouth daily.   insulin glargine (LANTUS SOLOSTAR) 100 UNIT/ML Solostar Pen Inject 25 Units into the skin every morning.   lisinopril (PRINIVIL,ZESTRIL) 10 MG tablet Take 10 mg by mouth daily.   melatonin 3 MG TABS tablet Take 3 mg by mouth daily.   memantine (NAMENDA XR) 14 MG CP24 24 hr capsule Take 14 mg by mouth daily.   OLANZapine (ZYPREXA) 2.5 MG tablet Take 2.5 mg by mouth at bedtime.   pantoprazole (PROTONIX) 40 MG tablet Take 1 tablet (40 mg total) by mouth daily.   rosuvastatin (CRESTOR) 20 MG tablet Take 1 tablet (20 mg total) by mouth daily.     Allergies:   Patient has no known allergies.   Social History   Socioeconomic History   Marital status: Married    Spouse name: Not on file   Number of children: Not on  file   Years of education: Not on file   Highest education level: Not on file  Occupational History   Not on file  Tobacco Use   Smoking status: Former    Types: Cigarettes    Quit date: 08/28/2001    Years since quitting: 20.5   Smokeless tobacco: Never  Substance and Sexual Activity   Alcohol use: No   Drug use: Yes    Types: Marijuana    Comment: no marijuana in the past month   Sexual activity: Not on file  Other Topics Concern   Not on file  Social History Narrative   Not on file   Social Determinants of Health   Financial Resource Strain: Not on file  Food Insecurity: Not on file  Transportation Needs: Not on file  Physical Activity: Not on file  Stress: Not on file  Social Connections: Not on file     Family History: The patient's family history includes Diabetes in an other family member.  ROS:   Please see the history of present illness.    (+)  Multiple blisters of RLE All other systems reviewed and are negative.  EKGs/Labs/Other Studies Reviewed:    The following studies were reviewed today:  Right/Left Heart Cath  09/05/21: IMPRESSION: Mr. Grzywacz has a left dominant system with no obstructive disease.  His filling pressures were fairly normal.  Cardiac output was 6.6 L/min with an index of 3 L/min/m and his LVEDP was 14.  He is a low risk for undergoing vascular surgery.  The radial sheath was removed and a TR band was placed on the right wrist to achieve patent hemostasis.  The patient left lab in stable condition.  Dr. Johnsie Cancel, the patient's attending cardiologist, was notified of these results.  Echocardiogram  09/03/21:  1. Left ventricular ejection fraction, by estimation, is 55 to 60%. The  left ventricle has normal function. The left ventricle has no regional  wall motion abnormalities. Left ventricular diastolic parameters were  normal.   2. Right ventricular systolic function is normal. The right ventricular  size is normal.   3. Left atrial size  was mildly dilated.   4. The mitral valve is normal in structure. No evidence of mitral valve  regurgitation. No evidence of mitral stenosis.   5. The aortic valve is normal in structure. Aortic valve regurgitation is  not visualized. No aortic stenosis is present.   6. The inferior vena cava is dilated in size with <50% respiratory  variability, suggesting right atrial pressure of 15 mmHg.  Abdominal Aortogram  09/02/21: Findings: There are single renal arteries bilaterally with no significant renal artery stenosis identified. The infrarenal aorta is patent. On the right side, which is the side of concern, the common iliac and external iliac arteries are patent.  There is calcific disease in the iliac system.  The hypogastric artery is patent. The right superficial femoral artery was occluded at its origin.  The common femoral and deep femoral artery were patent.  There was reconstitution of the above-knee popliteal artery on the right with two-vessel runoff via the anterior tibial and peroneal arteries.  The posterior tibial artery is occluded. I was unable to perform successful angioplasty and stenting of the right superficial femoral artery as I could not get back into the normal system distally and was concerned that I was creating a more distal dissection. On the left side, the common iliac artery is patent.  The left external iliac artery appears to have a short segment occlusion.   CLINICAL NOTE: I will review the films with Dr. Trula Slade.  He may potentially be a candidate for a right femoral to above-knee popliteal artery bypass.   ABI Doppler  08/31/21: Summary:  Right: Resting right ankle-brachial index indicates moderate right lower  extremity arterial disease.   Left:  AKA.   CT Chest  08/30/21: IMPRESSION: 1. Diffuse esophageal wall thickening with occasional intraluminal debris, suspicious for esophagitis. 2. Layering debris within the trachea and mainstem bronchi.  Central bronchial thickening with subsegmental mucous plugging in the right lower lobe. Small nodular opacities in the dependent right lower lobe and to a lesser extent left lower lobe, in the region of bronchial occlusion/mucous plugging. In the setting of sepsis and cough, findings likely represent infectious bronchiolitis. The possibility of aspiration is raised. 3. Mild emphysema. 4. Possible 2.1 cm exophytic nodule from the posterior aspect of the right thyroid gland. Alternatively this may represent an enlarged parathyroid gland or lymph node. Recommend thyroid US (ref: J Am Coll Radiol. 2015 Feb;12(2): 143-50). 5. Aortic atherosclerosis and coronary artery calcifications.  6. Pericardial calcifications, can be seen in the setting of prior pericarditis or constrictive cardiomyopathy. Clinical correlation is recommended, consider further assessment with echocardiogram as clinically indicated.   Aortic Atherosclerosis (ICD10-I70.0) and Emphysema (ICD10-J43.9).   EKG:  EKG is personally reviewed and interpreted. 03/28/22: Sinus rhythm. First degree AV block  Recent Labs: 04/13/2021: Magnesium 1.8 08/30/2021: B Natriuretic Peptide 117.2; TSH 1.194 10/15/2021: ALT 13; BUN 14; Creatinine, Ser 1.11; Hemoglobin 13.6; Platelets 312; Potassium 3.7; Sodium 139   Recent Lipid Panel    Component Value Date/Time   CHOL  06/28/2010 1047    121        ATP III CLASSIFICATION:  <200     mg/dL   Desirable  200-239  mg/dL   Borderline High  >=240    mg/dL   High          TRIG 65 06/28/2010 1047   HDL 34 (L) 06/28/2010 1047   CHOLHDL 3.6 06/28/2010 1047   VLDL 13 06/28/2010 1047   [place]  06/28/2010 1047    74        Total Cholesterol/HDL:CHD Risk Coronary Heart Disease Risk Table                     Men   Women  1/2 Average Risk   3.4   3.3  Average Risk       5.0   4.4  2 X Average Risk   9.6   7.1  3 X Average Risk  23.4   11.0        Use the calculated Patient Ratio above and the  CHD Risk Table to determine the patient's CHD Risk.        ATP III CLASSIFICATION (LDL):  <100     mg/dL   Optimal  100-129  mg/dL   Near or Above                    Optimal  130-159  mg/dL   Borderline  160-189  mg/dL   High  >190     mg/dL   Very High     Risk Assessment/Calculations:          Physical Exam:    VS:  BP (!) 152/80 (BP Location: Left Arm, Patient Position: Sitting, Cuff Size: Normal)   Pulse 65   Ht '6\' 1"'$  (1.854 m)   Wt 207 lb (93.9 kg)   BMI 27.31 kg/m     Wt Readings from Last 3 Encounters:  03/28/22 207 lb (93.9 kg)  10/15/21 216 lb 4.3 oz (98.1 kg)  08/30/21 216 lb 4.3 oz (98.1 kg)     GEN: Well nourished, well developed in no acute distress; in wheelchair HEENT: Normal NECK: No JVD; No carotid bruits LYMPHATICS: No lymphadenopathy CARDIAC: RRR, no murmurs, rubs, gallops RESPIRATORY:  Clear to auscultation without rales, wheezing or rhonchi  ABDOMEN: Soft, non-tender, non-distended MUSCULOSKELETAL:  No edema; No deformity; left BKA prosthesis in place. SKIN: Warm and dry; RLE blister-like lesions, no active signs of infection NEUROLOGIC:  Alert and oriented x 3 PSYCHIATRIC:  Normal affect   ASSESSMENT:    1. AF (paroxysmal atrial fibrillation) ([place])   2. Coronary artery disease involving coronary bypass graft of native heart with angina pectoris ([place])   3. Mixed hyperlipidemia   4. Stage 3a chronic kidney disease (H[facility])    PLAN:    In order of problems listed above:  Chronic diastolic heart failure - Most recent echocardiogram  showed normal ejection fraction.  Continuing with carvedilol 25 mg twice a day, Lasix 20 mg once a day lisinopril 10 mg once a day for medical management.  No change in prescription drugs.  Peripheral arterial disease status post left BKA - Stable.  Prosthesis in place.  Vascular surgery notes previously reviewed.  Paroxysmal atrial fibrillation - Eliquis 5 mg twice a day.  Continue.  Continue to monitor both  hemoglobin as well as creatinine.  EKG today shows first-degree AV block 214 ms with a heart rate of 65 bpm.  Small amplitude P waves noted.  Hyperlipidemia - Continue with Crestor 20 mg a day for plaque stabilization.  Also helpful for peripheral arterial disease.  Chronic kidney disease stage IIIa - Prior creatinine 1.17.  Continue to monitor.  Avoid nephrotoxic agents.  Coronary artery calcification - Thankfully on catheterization no obstructive disease.  He does have diffuse multivessel calcification however.  Continue with statin.  Since he is on Eliquis, no aspirin.  Prior osteomyelitis with peripheral arterial disease - Currently stable.  Watch for any signs of future infection.     Follow-up: 1 year.  Medication Adjustments/Labs and Tests Ordered: Current medicines are reviewed at length with the patient today.  Concerns regarding medicines are outlined above.   Orders Placed This Encounter  Procedures   EKG 12-Lead   No orders of the defined types were placed in this encounter.  Patient Instructions  Medication Instructions:  The current medical regimen is effective;  continue present plan and medications.  *If you need a refill on your cardiac medications before your next appointment, please call your pharmacy*  Follow-Up: At [facility], you and your health needs are our priority.  As part of our continuing mission to provide you with exceptional heart care, we have created designated Provider Care Teams.  These Care Teams include your primary Cardiologist (physician) and Advanced Practice Providers (APPs -  Physician Assistants and Nurse Practitioners) who all work together to provide you with the care you need, when you need it.  We recommend signing up for the patient portal called "MyChart".  Sign up information is provided on this After Visit Summary.  MyChart is used to connect with patients for Virtual Visits (Telemedicine).  Patients are able to view  lab/test results, encounter notes, upcoming appointments, etc.  Non-urgent messages can be sent to your provider as well.   To learn more about what you can do with MyChart, go to [email or web address].    Your next appointment:   1 year(s)  The format for your next appointment:   In Person  Provider:   Dr Candee Furbish   Important Information About Sugar         I,Mathew Stumpf,acting as a scribe for Candee Furbish, MD.,have documented all relevant documentation on the behalf of Candee Furbish, MD,as directed by  Candee Furbish, MD while in the presence of Candee Furbish, MD.  I, Candee Furbish, MD, have reviewed all documentation for this visit. The documentation on 03/28/22 for the exam, diagnosis, procedures, and orders are all accurate and complete.   Signed, Candee Furbish, MD  03/28/2022 9:56 AM    [facility]

## 2022-03-31 DIAGNOSIS — I77811 Abdominal aortic ectasia: Secondary | ICD-10-CM | POA: Diagnosis not present

## 2022-04-01 DIAGNOSIS — E782 Mixed hyperlipidemia: Secondary | ICD-10-CM | POA: Diagnosis not present

## 2022-04-01 DIAGNOSIS — J45909 Unspecified asthma, uncomplicated: Secondary | ICD-10-CM | POA: Diagnosis not present

## 2022-04-01 DIAGNOSIS — E119 Type 2 diabetes mellitus without complications: Secondary | ICD-10-CM | POA: Diagnosis not present

## 2022-04-01 DIAGNOSIS — N183 Chronic kidney disease, stage 3 unspecified: Secondary | ICD-10-CM | POA: Diagnosis not present

## 2022-04-01 DIAGNOSIS — I10 Essential (primary) hypertension: Secondary | ICD-10-CM | POA: Diagnosis not present

## 2022-04-01 DIAGNOSIS — E038 Other specified hypothyroidism: Secondary | ICD-10-CM | POA: Diagnosis not present

## 2022-04-03 DIAGNOSIS — L89896 Pressure-induced deep tissue damage of other site: Secondary | ICD-10-CM | POA: Diagnosis not present

## 2022-04-03 DIAGNOSIS — M19011 Primary osteoarthritis, right shoulder: Secondary | ICD-10-CM | POA: Diagnosis not present

## 2022-04-03 DIAGNOSIS — A419 Sepsis, unspecified organism: Secondary | ICD-10-CM | POA: Diagnosis not present

## 2022-04-03 DIAGNOSIS — Z720 Tobacco use: Secondary | ICD-10-CM | POA: Diagnosis not present

## 2022-04-03 DIAGNOSIS — I739 Peripheral vascular disease, unspecified: Secondary | ICD-10-CM | POA: Diagnosis not present

## 2022-04-03 DIAGNOSIS — Z8719 Personal history of other diseases of the digestive system: Secondary | ICD-10-CM | POA: Diagnosis not present

## 2022-04-03 DIAGNOSIS — I700 Atherosclerosis of aorta: Secondary | ICD-10-CM | POA: Diagnosis not present

## 2022-04-03 DIAGNOSIS — R0602 Shortness of breath: Secondary | ICD-10-CM | POA: Diagnosis not present

## 2022-04-03 DIAGNOSIS — K922 Gastrointestinal hemorrhage, unspecified: Secondary | ICD-10-CM | POA: Diagnosis not present

## 2022-04-03 DIAGNOSIS — D62 Acute posthemorrhagic anemia: Secondary | ICD-10-CM | POA: Diagnosis not present

## 2022-04-03 DIAGNOSIS — R0902 Hypoxemia: Secondary | ICD-10-CM | POA: Diagnosis not present

## 2022-04-03 DIAGNOSIS — J440 Chronic obstructive pulmonary disease with acute lower respiratory infection: Secondary | ICD-10-CM | POA: Diagnosis not present

## 2022-04-03 DIAGNOSIS — Z89512 Acquired absence of left leg below knee: Secondary | ICD-10-CM | POA: Diagnosis not present

## 2022-04-03 DIAGNOSIS — R509 Fever, unspecified: Secondary | ICD-10-CM | POA: Diagnosis not present

## 2022-04-03 DIAGNOSIS — D508 Other iron deficiency anemias: Secondary | ICD-10-CM | POA: Diagnosis not present

## 2022-04-03 DIAGNOSIS — E119 Type 2 diabetes mellitus without complications: Secondary | ICD-10-CM | POA: Diagnosis not present

## 2022-04-03 DIAGNOSIS — R6520 Severe sepsis without septic shock: Secondary | ICD-10-CM | POA: Diagnosis not present

## 2022-04-03 DIAGNOSIS — I5032 Chronic diastolic (congestive) heart failure: Secondary | ICD-10-CM | POA: Diagnosis not present

## 2022-04-03 DIAGNOSIS — S91101D Unspecified open wound of right great toe without damage to nail, subsequent encounter: Secondary | ICD-10-CM | POA: Diagnosis not present

## 2022-04-03 DIAGNOSIS — Z794 Long term (current) use of insulin: Secondary | ICD-10-CM | POA: Diagnosis not present

## 2022-04-03 DIAGNOSIS — D235 Other benign neoplasm of skin of trunk: Secondary | ICD-10-CM | POA: Diagnosis not present

## 2022-04-03 DIAGNOSIS — L14 Bullous disorders in diseases classified elsewhere: Secondary | ICD-10-CM | POA: Diagnosis not present

## 2022-04-03 DIAGNOSIS — R410 Disorientation, unspecified: Secondary | ICD-10-CM | POA: Diagnosis not present

## 2022-04-03 DIAGNOSIS — N183 Chronic kidney disease, stage 3 unspecified: Secondary | ICD-10-CM | POA: Diagnosis not present

## 2022-04-03 DIAGNOSIS — Z993 Dependence on wheelchair: Secondary | ICD-10-CM | POA: Diagnosis not present

## 2022-04-03 DIAGNOSIS — R5381 Other malaise: Secondary | ICD-10-CM | POA: Diagnosis not present

## 2022-04-03 DIAGNOSIS — J449 Chronic obstructive pulmonary disease, unspecified: Secondary | ICD-10-CM | POA: Diagnosis not present

## 2022-04-03 DIAGNOSIS — E11622 Type 2 diabetes mellitus with other skin ulcer: Secondary | ICD-10-CM | POA: Diagnosis not present

## 2022-04-03 DIAGNOSIS — R739 Hyperglycemia, unspecified: Secondary | ICD-10-CM | POA: Diagnosis not present

## 2022-04-03 DIAGNOSIS — Z7951 Long term (current) use of inhaled steroids: Secondary | ICD-10-CM | POA: Diagnosis not present

## 2022-04-03 DIAGNOSIS — E1165 Type 2 diabetes mellitus with hyperglycemia: Secondary | ICD-10-CM | POA: Diagnosis not present

## 2022-04-03 DIAGNOSIS — Z9862 Peripheral vascular angioplasty status: Secondary | ICD-10-CM | POA: Diagnosis not present

## 2022-04-03 DIAGNOSIS — D649 Anemia, unspecified: Secondary | ICD-10-CM | POA: Diagnosis not present

## 2022-04-03 DIAGNOSIS — I4891 Unspecified atrial fibrillation: Secondary | ICD-10-CM | POA: Diagnosis not present

## 2022-04-03 DIAGNOSIS — I480 Paroxysmal atrial fibrillation: Secondary | ICD-10-CM | POA: Diagnosis not present

## 2022-04-03 DIAGNOSIS — R58 Hemorrhage, not elsewhere classified: Secondary | ICD-10-CM | POA: Diagnosis not present

## 2022-04-03 DIAGNOSIS — N3090 Cystitis, unspecified without hematuria: Secondary | ICD-10-CM | POA: Diagnosis not present

## 2022-04-03 DIAGNOSIS — F17290 Nicotine dependence, other tobacco product, uncomplicated: Secondary | ICD-10-CM | POA: Diagnosis not present

## 2022-04-03 DIAGNOSIS — Z7901 Long term (current) use of anticoagulants: Secondary | ICD-10-CM | POA: Diagnosis not present

## 2022-04-03 DIAGNOSIS — Z86010 Personal history of colonic polyps: Secondary | ICD-10-CM | POA: Diagnosis not present

## 2022-04-03 DIAGNOSIS — R1310 Dysphagia, unspecified: Secondary | ICD-10-CM | POA: Diagnosis not present

## 2022-04-03 DIAGNOSIS — Z743 Need for continuous supervision: Secondary | ICD-10-CM | POA: Diagnosis not present

## 2022-04-03 DIAGNOSIS — E1151 Type 2 diabetes mellitus with diabetic peripheral angiopathy without gangrene: Secondary | ICD-10-CM | POA: Diagnosis not present

## 2022-04-03 DIAGNOSIS — K921 Melena: Secondary | ICD-10-CM | POA: Diagnosis not present

## 2022-04-03 DIAGNOSIS — Z20822 Contact with and (suspected) exposure to covid-19: Secondary | ICD-10-CM | POA: Diagnosis not present

## 2022-04-03 DIAGNOSIS — I110 Hypertensive heart disease with heart failure: Secondary | ICD-10-CM | POA: Diagnosis not present

## 2022-04-03 DIAGNOSIS — J9601 Acute respiratory failure with hypoxia: Secondary | ICD-10-CM | POA: Diagnosis not present

## 2022-04-12 DIAGNOSIS — K921 Melena: Secondary | ICD-10-CM | POA: Diagnosis not present

## 2022-04-12 DIAGNOSIS — N390 Urinary tract infection, site not specified: Secondary | ICD-10-CM | POA: Diagnosis not present

## 2022-04-13 DIAGNOSIS — I110 Hypertensive heart disease with heart failure: Secondary | ICD-10-CM | POA: Diagnosis not present

## 2022-04-13 DIAGNOSIS — Z794 Long term (current) use of insulin: Secondary | ICD-10-CM | POA: Diagnosis not present

## 2022-04-13 DIAGNOSIS — D235 Other benign neoplasm of skin of trunk: Secondary | ICD-10-CM | POA: Diagnosis not present

## 2022-04-13 DIAGNOSIS — N183 Chronic kidney disease, stage 3 unspecified: Secondary | ICD-10-CM | POA: Diagnosis not present

## 2022-04-13 DIAGNOSIS — I5032 Chronic diastolic (congestive) heart failure: Secondary | ICD-10-CM | POA: Diagnosis not present

## 2022-04-13 DIAGNOSIS — L14 Bullous disorders in diseases classified elsewhere: Secondary | ICD-10-CM | POA: Diagnosis not present

## 2022-04-13 DIAGNOSIS — E11622 Type 2 diabetes mellitus with other skin ulcer: Secondary | ICD-10-CM | POA: Diagnosis not present

## 2022-04-13 DIAGNOSIS — S91101D Unspecified open wound of right great toe without damage to nail, subsequent encounter: Secondary | ICD-10-CM | POA: Diagnosis not present

## 2022-04-13 DIAGNOSIS — I480 Paroxysmal atrial fibrillation: Secondary | ICD-10-CM | POA: Diagnosis not present

## 2022-04-20 DIAGNOSIS — N183 Chronic kidney disease, stage 3 unspecified: Secondary | ICD-10-CM | POA: Diagnosis not present

## 2022-04-20 DIAGNOSIS — I480 Paroxysmal atrial fibrillation: Secondary | ICD-10-CM | POA: Diagnosis not present

## 2022-04-20 DIAGNOSIS — S91101D Unspecified open wound of right great toe without damage to nail, subsequent encounter: Secondary | ICD-10-CM | POA: Diagnosis not present

## 2022-04-20 DIAGNOSIS — Z794 Long term (current) use of insulin: Secondary | ICD-10-CM | POA: Diagnosis not present

## 2022-04-20 DIAGNOSIS — L14 Bullous disorders in diseases classified elsewhere: Secondary | ICD-10-CM | POA: Diagnosis not present

## 2022-04-20 DIAGNOSIS — D235 Other benign neoplasm of skin of trunk: Secondary | ICD-10-CM | POA: Diagnosis not present

## 2022-04-20 DIAGNOSIS — E11622 Type 2 diabetes mellitus with other skin ulcer: Secondary | ICD-10-CM | POA: Diagnosis not present

## 2022-04-20 DIAGNOSIS — I5032 Chronic diastolic (congestive) heart failure: Secondary | ICD-10-CM | POA: Diagnosis not present

## 2022-04-20 DIAGNOSIS — I110 Hypertensive heart disease with heart failure: Secondary | ICD-10-CM | POA: Diagnosis not present

## 2022-04-25 DIAGNOSIS — I10 Essential (primary) hypertension: Secondary | ICD-10-CM | POA: Diagnosis not present

## 2022-04-26 DIAGNOSIS — E119 Type 2 diabetes mellitus without complications: Secondary | ICD-10-CM | POA: Diagnosis not present

## 2022-04-26 DIAGNOSIS — Z79899 Other long term (current) drug therapy: Secondary | ICD-10-CM | POA: Diagnosis not present

## 2022-04-26 DIAGNOSIS — E782 Mixed hyperlipidemia: Secondary | ICD-10-CM | POA: Diagnosis not present

## 2022-04-26 DIAGNOSIS — E038 Other specified hypothyroidism: Secondary | ICD-10-CM | POA: Diagnosis not present

## 2022-04-26 DIAGNOSIS — D518 Other vitamin B12 deficiency anemias: Secondary | ICD-10-CM | POA: Diagnosis not present

## 2022-04-26 DIAGNOSIS — E559 Vitamin D deficiency, unspecified: Secondary | ICD-10-CM | POA: Diagnosis not present

## 2022-04-27 DIAGNOSIS — N183 Chronic kidney disease, stage 3 unspecified: Secondary | ICD-10-CM | POA: Diagnosis not present

## 2022-04-27 DIAGNOSIS — I5032 Chronic diastolic (congestive) heart failure: Secondary | ICD-10-CM | POA: Diagnosis not present

## 2022-04-27 DIAGNOSIS — D235 Other benign neoplasm of skin of trunk: Secondary | ICD-10-CM | POA: Diagnosis not present

## 2022-04-27 DIAGNOSIS — L14 Bullous disorders in diseases classified elsewhere: Secondary | ICD-10-CM | POA: Diagnosis not present

## 2022-04-27 DIAGNOSIS — E11622 Type 2 diabetes mellitus with other skin ulcer: Secondary | ICD-10-CM | POA: Diagnosis not present

## 2022-04-27 DIAGNOSIS — S91101D Unspecified open wound of right great toe without damage to nail, subsequent encounter: Secondary | ICD-10-CM | POA: Diagnosis not present

## 2022-04-27 DIAGNOSIS — I110 Hypertensive heart disease with heart failure: Secondary | ICD-10-CM | POA: Diagnosis not present

## 2022-04-27 DIAGNOSIS — Z794 Long term (current) use of insulin: Secondary | ICD-10-CM | POA: Diagnosis not present

## 2022-04-27 DIAGNOSIS — I480 Paroxysmal atrial fibrillation: Secondary | ICD-10-CM | POA: Diagnosis not present

## 2022-04-29 DIAGNOSIS — E119 Type 2 diabetes mellitus without complications: Secondary | ICD-10-CM | POA: Diagnosis not present

## 2022-04-29 DIAGNOSIS — N183 Chronic kidney disease, stage 3 unspecified: Secondary | ICD-10-CM | POA: Diagnosis not present

## 2022-04-29 DIAGNOSIS — I10 Essential (primary) hypertension: Secondary | ICD-10-CM | POA: Diagnosis not present

## 2022-04-29 DIAGNOSIS — E559 Vitamin D deficiency, unspecified: Secondary | ICD-10-CM | POA: Diagnosis not present

## 2022-04-29 DIAGNOSIS — E782 Mixed hyperlipidemia: Secondary | ICD-10-CM | POA: Diagnosis not present

## 2022-04-29 DIAGNOSIS — D518 Other vitamin B12 deficiency anemias: Secondary | ICD-10-CM | POA: Diagnosis not present

## 2022-04-29 DIAGNOSIS — E038 Other specified hypothyroidism: Secondary | ICD-10-CM | POA: Diagnosis not present

## 2022-05-05 DIAGNOSIS — E11622 Type 2 diabetes mellitus with other skin ulcer: Secondary | ICD-10-CM | POA: Diagnosis not present

## 2022-05-05 DIAGNOSIS — I5032 Chronic diastolic (congestive) heart failure: Secondary | ICD-10-CM | POA: Diagnosis not present

## 2022-05-05 DIAGNOSIS — I110 Hypertensive heart disease with heart failure: Secondary | ICD-10-CM | POA: Diagnosis not present

## 2022-05-05 DIAGNOSIS — S91101D Unspecified open wound of right great toe without damage to nail, subsequent encounter: Secondary | ICD-10-CM | POA: Diagnosis not present

## 2022-05-05 DIAGNOSIS — Z794 Long term (current) use of insulin: Secondary | ICD-10-CM | POA: Diagnosis not present

## 2022-05-05 DIAGNOSIS — D235 Other benign neoplasm of skin of trunk: Secondary | ICD-10-CM | POA: Diagnosis not present

## 2022-05-05 DIAGNOSIS — L14 Bullous disorders in diseases classified elsewhere: Secondary | ICD-10-CM | POA: Diagnosis not present

## 2022-05-05 DIAGNOSIS — I480 Paroxysmal atrial fibrillation: Secondary | ICD-10-CM | POA: Diagnosis not present

## 2022-05-05 DIAGNOSIS — N183 Chronic kidney disease, stage 3 unspecified: Secondary | ICD-10-CM | POA: Diagnosis not present

## 2022-05-09 DIAGNOSIS — I480 Paroxysmal atrial fibrillation: Secondary | ICD-10-CM | POA: Diagnosis not present

## 2022-05-09 DIAGNOSIS — I5032 Chronic diastolic (congestive) heart failure: Secondary | ICD-10-CM | POA: Diagnosis not present

## 2022-05-09 DIAGNOSIS — Z794 Long term (current) use of insulin: Secondary | ICD-10-CM | POA: Diagnosis not present

## 2022-05-09 DIAGNOSIS — I110 Hypertensive heart disease with heart failure: Secondary | ICD-10-CM | POA: Diagnosis not present

## 2022-05-09 DIAGNOSIS — D235 Other benign neoplasm of skin of trunk: Secondary | ICD-10-CM | POA: Diagnosis not present

## 2022-05-09 DIAGNOSIS — N183 Chronic kidney disease, stage 3 unspecified: Secondary | ICD-10-CM | POA: Diagnosis not present

## 2022-05-09 DIAGNOSIS — L14 Bullous disorders in diseases classified elsewhere: Secondary | ICD-10-CM | POA: Diagnosis not present

## 2022-05-09 DIAGNOSIS — S91101D Unspecified open wound of right great toe without damage to nail, subsequent encounter: Secondary | ICD-10-CM | POA: Diagnosis not present

## 2022-05-09 DIAGNOSIS — E11622 Type 2 diabetes mellitus with other skin ulcer: Secondary | ICD-10-CM | POA: Diagnosis not present

## 2022-05-10 DIAGNOSIS — I5032 Chronic diastolic (congestive) heart failure: Secondary | ICD-10-CM | POA: Diagnosis not present

## 2022-05-10 DIAGNOSIS — J45909 Unspecified asthma, uncomplicated: Secondary | ICD-10-CM | POA: Diagnosis not present

## 2022-05-10 DIAGNOSIS — I480 Paroxysmal atrial fibrillation: Secondary | ICD-10-CM | POA: Diagnosis not present

## 2022-05-18 DIAGNOSIS — R300 Dysuria: Secondary | ICD-10-CM | POA: Diagnosis not present

## 2022-05-23 DIAGNOSIS — N390 Urinary tract infection, site not specified: Secondary | ICD-10-CM | POA: Diagnosis not present

## 2022-08-28 DEATH — deceased
